# Patient Record
Sex: Female | Born: 1960
Health system: Southern US, Community
[De-identification: ages and names within clinical notes are randomized; demographics above are authoritative.]

## PROBLEM LIST (undated history)

## (undated) DIAGNOSIS — E119 Type 2 diabetes mellitus without complications: Secondary | ICD-10-CM

## (undated) DIAGNOSIS — F419 Anxiety disorder, unspecified: Secondary | ICD-10-CM

## (undated) DIAGNOSIS — K5909 Other constipation: Secondary | ICD-10-CM

## (undated) DIAGNOSIS — N951 Menopausal and female climacteric states: Secondary | ICD-10-CM

## (undated) DIAGNOSIS — R600 Localized edema: Secondary | ICD-10-CM

## (undated) DIAGNOSIS — L309 Dermatitis, unspecified: Secondary | ICD-10-CM

## (undated) DIAGNOSIS — R0683 Snoring: Secondary | ICD-10-CM

## (undated) DIAGNOSIS — K219 Gastro-esophageal reflux disease without esophagitis: Secondary | ICD-10-CM

## (undated) DIAGNOSIS — K56609 Unspecified intestinal obstruction, unspecified as to partial versus complete obstruction: Secondary | ICD-10-CM

## (undated) DIAGNOSIS — I10 Essential (primary) hypertension: Secondary | ICD-10-CM

## (undated) DIAGNOSIS — E785 Hyperlipidemia, unspecified: Secondary | ICD-10-CM

## (undated) DIAGNOSIS — D649 Anemia, unspecified: Secondary | ICD-10-CM

## (undated) DIAGNOSIS — A6004 Herpesviral vulvovaginitis: Secondary | ICD-10-CM

## (undated) DIAGNOSIS — T7840XA Allergy, unspecified, initial encounter: Secondary | ICD-10-CM

## (undated) HISTORY — PX: OTHER SURGICAL HISTORY: SHX169

## (undated) HISTORY — DX: Anemia, unspecified: D64.9

## (undated) HISTORY — PX: WISDOM TOOTH EXTRACTION: SHX21

## (undated) HISTORY — DX: Snoring: R06.83

## (undated) HISTORY — DX: Essential (primary) hypertension: I10

## (undated) HISTORY — DX: Hyperlipidemia, unspecified: E78.5

## (undated) HISTORY — DX: Dermatitis, unspecified: L30.9

## (undated) HISTORY — DX: Gastro-esophageal reflux disease without esophagitis: K21.9

## (undated) HISTORY — DX: Localized edema: R60.0

## (undated) HISTORY — DX: Other constipation: K59.09

## (undated) HISTORY — DX: Anxiety disorder, unspecified: F41.9

## (undated) HISTORY — DX: Menopausal and female climacteric states: N95.1

## (undated) HISTORY — DX: Herpesviral vulvovaginitis: A60.04

## (undated) HISTORY — DX: Allergy, unspecified, initial encounter: T78.40XA

## (undated) HISTORY — DX: Type 2 diabetes mellitus without complications: E11.9

## (undated) HISTORY — DX: Morbid (severe) obesity due to excess calories: E66.01

---

## 2007-02-09 ENCOUNTER — Ambulatory Visit: Payer: Self-pay | Admitting: Internal Medicine

## 2007-02-14 ENCOUNTER — Ambulatory Visit: Payer: Self-pay | Admitting: Internal Medicine

## 2013-02-01 LAB — HM PAP SMEAR: HM Pap smear: NORMAL

## 2013-06-14 ENCOUNTER — Ambulatory Visit: Payer: Self-pay | Admitting: Family Medicine

## 2013-06-14 LAB — HM MAMMOGRAPHY: HM Mammogram: NORMAL

## 2013-08-13 LAB — LIPID PANEL
Cholesterol: 144 mg/dL (ref 0–200)
HDL: 55 mg/dL (ref 35–70)
LDL CALC: 76 mg/dL
TRIGLYCERIDES: 67 mg/dL (ref 40–160)

## 2014-09-09 LAB — HEMOGLOBIN A1C: Hgb A1c MFr Bld: 6.9 % — AB (ref 4.0–6.0)

## 2014-10-13 ENCOUNTER — Other Ambulatory Visit: Payer: Self-pay | Admitting: Family Medicine

## 2014-10-13 NOTE — Telephone Encounter (Signed)
Sent 2 months supply , needs follow up the end of August.

## 2014-12-14 ENCOUNTER — Encounter: Payer: Self-pay | Admitting: Family Medicine

## 2014-12-14 DIAGNOSIS — K219 Gastro-esophageal reflux disease without esophagitis: Secondary | ICD-10-CM | POA: Insufficient documentation

## 2014-12-14 DIAGNOSIS — E1129 Type 2 diabetes mellitus with other diabetic kidney complication: Secondary | ICD-10-CM | POA: Insufficient documentation

## 2014-12-14 DIAGNOSIS — N951 Menopausal and female climacteric states: Secondary | ICD-10-CM | POA: Insufficient documentation

## 2014-12-14 DIAGNOSIS — A6004 Herpesviral vulvovaginitis: Secondary | ICD-10-CM | POA: Insufficient documentation

## 2014-12-14 DIAGNOSIS — I10 Essential (primary) hypertension: Secondary | ICD-10-CM | POA: Insufficient documentation

## 2014-12-14 DIAGNOSIS — A6009 Herpesviral infection of other urogenital tract: Secondary | ICD-10-CM | POA: Insufficient documentation

## 2014-12-14 DIAGNOSIS — L309 Dermatitis, unspecified: Secondary | ICD-10-CM | POA: Insufficient documentation

## 2014-12-14 DIAGNOSIS — K5909 Other constipation: Secondary | ICD-10-CM | POA: Insufficient documentation

## 2014-12-14 DIAGNOSIS — E785 Hyperlipidemia, unspecified: Secondary | ICD-10-CM | POA: Insufficient documentation

## 2014-12-14 DIAGNOSIS — J302 Other seasonal allergic rhinitis: Secondary | ICD-10-CM | POA: Insufficient documentation

## 2014-12-14 DIAGNOSIS — R600 Localized edema: Secondary | ICD-10-CM | POA: Insufficient documentation

## 2014-12-16 ENCOUNTER — Ambulatory Visit: Payer: Self-pay | Admitting: Family Medicine

## 2014-12-18 ENCOUNTER — Ambulatory Visit: Payer: Self-pay | Admitting: Family Medicine

## 2014-12-25 ENCOUNTER — Ambulatory Visit (INDEPENDENT_AMBULATORY_CARE_PROVIDER_SITE_OTHER): Payer: BLUE CROSS/BLUE SHIELD | Admitting: Family Medicine

## 2014-12-25 ENCOUNTER — Encounter: Payer: Self-pay | Admitting: Family Medicine

## 2014-12-25 VITALS — BP 136/88 | HR 104 | Temp 97.9°F | Resp 14 | Ht 63.0 in | Wt 257.0 lb

## 2014-12-25 DIAGNOSIS — Z23 Encounter for immunization: Secondary | ICD-10-CM

## 2014-12-25 DIAGNOSIS — K5900 Constipation, unspecified: Secondary | ICD-10-CM

## 2014-12-25 DIAGNOSIS — Z114 Encounter for screening for human immunodeficiency virus [HIV]: Secondary | ICD-10-CM

## 2014-12-25 DIAGNOSIS — R0683 Snoring: Secondary | ICD-10-CM | POA: Diagnosis not present

## 2014-12-25 DIAGNOSIS — R600 Localized edema: Secondary | ICD-10-CM

## 2014-12-25 DIAGNOSIS — R5383 Other fatigue: Secondary | ICD-10-CM

## 2014-12-25 DIAGNOSIS — N951 Menopausal and female climacteric states: Secondary | ICD-10-CM | POA: Diagnosis not present

## 2014-12-25 DIAGNOSIS — R609 Edema, unspecified: Secondary | ICD-10-CM

## 2014-12-25 DIAGNOSIS — I10 Essential (primary) hypertension: Secondary | ICD-10-CM | POA: Diagnosis not present

## 2014-12-25 DIAGNOSIS — E1121 Type 2 diabetes mellitus with diabetic nephropathy: Secondary | ICD-10-CM | POA: Diagnosis not present

## 2014-12-25 DIAGNOSIS — E785 Hyperlipidemia, unspecified: Secondary | ICD-10-CM

## 2014-12-25 DIAGNOSIS — K219 Gastro-esophageal reflux disease without esophagitis: Secondary | ICD-10-CM | POA: Diagnosis not present

## 2014-12-25 DIAGNOSIS — E668 Other obesity: Secondary | ICD-10-CM

## 2014-12-25 DIAGNOSIS — J302 Other seasonal allergic rhinitis: Secondary | ICD-10-CM | POA: Diagnosis not present

## 2014-12-25 DIAGNOSIS — K5909 Other constipation: Secondary | ICD-10-CM

## 2014-12-25 LAB — POCT GLYCOSYLATED HEMOGLOBIN (HGB A1C): HEMOGLOBIN A1C: 5.9

## 2014-12-25 LAB — POCT UA - MICROALBUMIN: Microalbumin Ur, POC: 100 mg/L

## 2014-12-25 MED ORDER — ATORVASTATIN CALCIUM 40 MG PO TABS: 40.0000 mg | ORAL_TABLET | Freq: Every evening | ORAL | Status: DC

## 2014-12-25 MED ORDER — LORATADINE 10 MG PO TABS: 10.0000 mg | ORAL_TABLET | Freq: Every day | ORAL | Status: DC

## 2014-12-25 MED ORDER — VALACYCLOVIR HCL 500 MG PO TABS: 500.0000 mg | ORAL_TABLET | Freq: Three times a day (TID) | ORAL | Status: DC

## 2014-12-25 MED ORDER — RANITIDINE HCL 150 MG PO TABS: 150.0000 mg | ORAL_TABLET | Freq: Every day | ORAL | Status: DC | PRN

## 2014-12-25 MED ORDER — VENLAFAXINE HCL ER 75 MG PO CP24: 75.0000 mg | ORAL_CAPSULE | Freq: Every day | ORAL | Status: DC

## 2014-12-25 MED ORDER — SITAGLIP PHOS-METFORMIN HCL ER 50-1000 MG PO TB24: 2.0000 | ORAL_TABLET | Freq: Every day | ORAL | Status: DC

## 2014-12-25 MED ORDER — TELMISARTAN-HCTZ 80-25 MG PO TABS: 1.0000 | ORAL_TABLET | Freq: Every day | ORAL | Status: DC

## 2014-12-25 NOTE — Progress Notes (Signed)
Name: Wendy [name]   MRN: [id number]    DOB: 02/07/1961   Date:12/25/2014       Progress Note  Subjective  Chief Complaint  Chief Complaint  Patient presents with  . Medication Refill    3 month F/U  . Diabetes    Patient lost her strips, but checked BS bid, Low- High-132   . Hypertension  . Depression  . Hyperlipidemia    HPI  DM with nephropathy: she is taking medication as recommended, except for the past week ( ran out ), fsbs is around 120-130's . Denies hypoglycemic episodes.  She denies polyphagia or polydipsia but she has urinary frequency.   HTN: taking medications as indicated, bp is at goal  Hyperlipidemia: taking Atorvastatin and denies side effects of medication  Menopausal symptoms: she has mood swings, hot flashes, night sweats, some vaginal dryness. She has been taking Effexor and it has improved symptoms  Obesity: no longer drinking sodas, but has not lost weight, however glucose is better, and is feeing is smaller clothes.    Patient Active Problem List   Diagnosis Date Noted  . Snoring 12/25/2014  . Allergic rhinitis, seasonal 12/14/2014  . Chronic constipation 12/14/2014  . Diabetes mellitus with renal manifestation 12/14/2014  . Dyslipidemia 12/14/2014  . Dermatitis, eczematoid 12/14/2014  . Edema extremities 12/14/2014  . Essential (primary) hypertension 12/14/2014  . Gastro-esophageal reflux disease without esophagitis 12/14/2014  . Genital herpes in women 12/14/2014  . Extreme obesity 12/14/2014  . Female climacteric state 12/14/2014    Past Surgical History  Procedure Laterality Date  . Cesarean section  1993    Family History  Problem Relation Age of Onset  . Diabetes Mother   . Asthma Mother   . Heart disease Mother   . Cancer Father     prostate  . Heart disease Father     Social History   Social History  . Marital Status: Married    Spouse Name: N/A  . Number of Children: N/A  . Years of Education: N/A   Occupational  History  . Not on file.   Social History Main Topics  . Smoking status: Never Smoker   . Smokeless tobacco: Never Used  . Alcohol Use: No  . Drug Use: No  . Sexual Activity: Yes   Other Topics Concern  . Not on file   Social History Narrative     Current outpatient prescriptions:  .  aspirin (ASPIRIN LOW DOSE) 81 MG chewable tablet, Chew 1 tablet by mouth daily., Disp: , Rfl:  .  atorvastatin (LIPITOR) 40 MG tablet, Take 1 tablet (40 mg total) by mouth every evening., Disp: 30 tablet, Rfl: 2 .  loratadine (CLARITIN) 10 MG tablet, Take 1 tablet (10 mg total) by mouth daily., Disp: 30 tablet, Rfl: 5 .  Polyethylene Glycol 3350 POWD, Take by mouth., Disp: , Rfl:  .  ranitidine (ZANTAC) 150 MG tablet, Take 1 tablet (150 mg total) by mouth daily as needed., Disp: 30 tablet, Rfl: 5 .  SitaGLIPtin-MetFORMIN HCl (JANUMET XR) 50-1000 MG TB24, Take 2 tablets by mouth daily., Disp: 60 tablet, Rfl: 2 .  telmisartan-hydrochlorothiazide (MICARDIS HCT) 80-25 MG per tablet, Take 1 tablet by mouth daily., Disp: 30 tablet, Rfl: 2 .  triamcinolone cream (KENALOG) 0.1 %, Apply 1 application topically as needed., Disp: , Rfl:  .  valACYclovir (VALTREX) 500 MG tablet, Take 1 tablet (500 mg total) by mouth 3 (three) times daily., Disp: 30 tablet, Rfl: 2 .  venlafaxine  XR (EFFEXOR-XR) 75 MG 24 hr capsule, Take 1 capsule (75 mg total) by mouth daily., Disp: 30 capsule, Rfl: 2  Allergies  Allergen Reactions  . Penicillins Swelling     ROS  Constitutional: Negative for fever or weight change.  Respiratory: Negative for cough and shortness of breath.   Cardiovascular: Negative for chest pain or palpitations.  Gastrointestinal: Negative for abdominal pain, no bowel changes.  Musculoskeletal: Negative for gait problem or joint swelling.  Skin: Negative for rash.  Neurological: Negative for dizziness or headache.  No other specific complaints in a complete review of systems (except as listed in HPI  above).   Objective  Filed Vitals:   12/25/14 1422  BP: 136/88  Pulse: 104  Temp: 97.9 F (36.6 C)  TempSrc: Oral  Resp: 14  Height: 5\' 3"  (1.6 m)  Weight: 257 lb (116.574 kg)  SpO2: 95%    Body mass index is 45.54 kg/(m^2).  Physical Exam  Constitutional: Patient appears well-developed and well-nourished. Obese  No distress.  neck supple, throat within normal limits Cardiovascular: Normal rate, regular rhythm and normal heart sounds.  No murmur heard. No BLE edema. Pulmonary/Chest: Effort normal and breath sounds normal. No respiratory distress. Abdominal: Soft.  There is no tenderness. Psychiatric: Patient has a normal mood and affect. behavior is normal. Judgment and thought content normal.  Recent Results (from the past 2160 hour(s))  POCT UA - Microalbumin     Status: Abnormal   Collection Time: 12/25/14  2:51 PM  Result Value Ref Range   Microalbumin Ur, POC 100 mg/L   Creatinine, POC  mg/dL   Albumin/Creatinine Ratio, Urine, POC    POCT HgB A1C     Status: Normal   Collection Time: 12/25/14  2:52 PM  Result Value Ref Range   Hemoglobin A1C 5.9     Diabetic Foot Exam: Diabetic Foot Exam - Simple   Simple Foot Form  Visual Inspection  No deformities, no ulcerations, no other skin breakdown bilaterally:  Yes  Sensation Testing  Intact to touch and monofilament testing bilaterally:  Yes  Pulse Check  Posterior Tibialis and Dorsalis pulse intact bilaterally:  Yes  Comments      PHQ2/9: Depression screen PHQ 2/9 12/25/2014  Decreased Interest 0  Down, Depressed, Hopeless 1  PHQ - 2 Score 1    Fall Risk: Fall Risk  12/25/2014  Falls in the past year? No    Functional Status Survey: Is the patient deaf or have difficulty hearing?: No Does the patient have difficulty seeing, even when wearing glasses/contacts?: Yes (glasses) Does the patient have difficulty walking or climbing stairs?: No Does the patient have difficulty dressing or bathing?: No Does  the patient have difficulty doing errands alone such as visiting a doctor's office or shopping?: No    Assessment & Plan  1. Type 2 diabetes mellitus with diabetic nephropathy Stop Glipizide , glucose is at goal, continue ARB, urine micro is going up - SitaGLIPtin-MetFORMIN HCl (JANUMET XR) 50-1000 MG TB24; Take 2 tablets by mouth daily.  Dispense: 60 tablet; Refill: 2  2. Needs flu shot  - Flu Vaccine QUAD 36+ mos PF IM (Fluarix & Fluzone Quad PF)-refused  3. Dyslipidemia  - Lipid panel - atorvastatin (LIPITOR) 40 MG tablet; Take 1 tablet (40 mg total) by mouth every evening.  Dispense: 30 tablet; Refill: 2  4. Gastro-esophageal reflux disease without esophagitis  - ranitidine (ZANTAC) 150 MG tablet; Take 1 tablet (150 mg total) by mouth daily as needed.  Dispense: 30 tablet; Refill: 5  5. Extreme obesity Discussed with the patient the risk posed by an increased BMI. Discussed importance of portion control, calorie counting and at least 150 minutes of physical activity weekly. Avoid sweet beverages and drink more water. Eat at least 6 servings of fruit and vegetables daily   6. Allergic rhinitis, seasonal  - loratadine (CLARITIN) 10 MG tablet; Take 1 tablet (10 mg total) by mouth daily.  Dispense: 30 tablet; Refill: 5  7. Essential (primary) hypertension  - Comprehensive metabolic panel - CBC with Differential/Platelet - telmisartan-hydrochlorothiazide (MICARDIS HCT) 80-25 MG per tablet; Take 1 tablet by mouth daily.  Dispense: 30 tablet; Refill: 2  8. Edema extremities Doing better  9. Chronic constipation Doing well on prn medication   10. Female climacteric state  - venlafaxine XR (EFFEXOR-XR) 75 MG 24 hr capsule; Take 1 capsule (75 mg total) by mouth daily.  Dispense: 30 capsule; Refill: 2  11. Snoring She missed appointment for sleep study because of her working hours, will call back when she changes shift  12. Need for pneumococcal vaccination  - Pneumococcal  polysaccharide vaccine 23-valent greater than or equal to 2yo subcutaneous/IM - refused  13. Encounter for screening for HIV  - HIV antibody  14. Other fatigue  - CBC with Differential/Platelet - Vitamin B12 - Vit D  25 hydroxy (rtn osteoporosis monitoring) - TSH

## 2014-12-26 LAB — HIV ANTIBODY (ROUTINE TESTING W REFLEX): HIV SCREEN 4TH GENERATION: NONREACTIVE

## 2014-12-26 LAB — CBC WITH DIFFERENTIAL/PLATELET
BASOS ABS: 0 10*3/uL (ref 0.0–0.2)
Basos: 0 %
EOS (ABSOLUTE): 0.2 10*3/uL (ref 0.0–0.4)
Eos: 2 %
HEMOGLOBIN: 12.3 g/dL (ref 11.1–15.9)
Hematocrit: 37.2 % (ref 34.0–46.6)
Immature Grans (Abs): 0 10*3/uL (ref 0.0–0.1)
Immature Granulocytes: 0 %
LYMPHS ABS: 3.5 10*3/uL — AB (ref 0.7–3.1)
Lymphs: 36 %
MCH: 27.8 pg (ref 26.6–33.0)
MCHC: 33.1 g/dL (ref 31.5–35.7)
MCV: 84 fL (ref 79–97)
Monocytes Absolute: 0.6 10*3/uL (ref 0.1–0.9)
Monocytes: 6 %
NEUTROS ABS: 5.5 10*3/uL (ref 1.4–7.0)
Neutrophils: 56 %
PLATELETS: 237 10*3/uL (ref 150–379)
RBC: 4.43 x10E6/uL (ref 3.77–5.28)
RDW: 15 % (ref 12.3–15.4)
WBC: 9.8 10*3/uL (ref 3.4–10.8)

## 2014-12-26 LAB — LIPID PANEL
CHOL/HDL RATIO: 2.9 ratio (ref 0.0–4.4)
CHOLESTEROL TOTAL: 140 mg/dL (ref 100–199)
HDL: 48 mg/dL (ref >39)
LDL CALC: 68 mg/dL (ref 0–99)
Triglycerides: 118 mg/dL (ref 0–149)
VLDL CHOLESTEROL CAL: 24 mg/dL (ref 5–40)

## 2014-12-26 LAB — COMPREHENSIVE METABOLIC PANEL
ALK PHOS: 119 IU/L — AB (ref 39–117)
ALT: 16 IU/L (ref 0–32)
AST: 18 IU/L (ref 0–40)
Albumin/Globulin Ratio: 1.9 (ref 1.1–2.5)
Albumin: 4.4 g/dL (ref 3.5–5.5)
BUN/Creatinine Ratio: 13 (ref 9–23)
BUN: 9 mg/dL (ref 6–24)
Bilirubin Total: 0.3 mg/dL (ref 0.0–1.2)
CALCIUM: 9.6 mg/dL (ref 8.7–10.2)
CO2: 23 mmol/L (ref 18–29)
CREATININE: 0.69 mg/dL (ref 0.57–1.00)
Chloride: 100 mmol/L (ref 97–108)
GFR calc Af Amer: 114 mL/min/{1.73_m2} (ref >59)
GFR, EST NON AFRICAN AMERICAN: 99 mL/min/{1.73_m2} (ref >59)
GLOBULIN, TOTAL: 2.3 g/dL (ref 1.5–4.5)
GLUCOSE: 109 mg/dL — AB (ref 65–99)
Potassium: 3.5 mmol/L (ref 3.5–5.2)
SODIUM: 144 mmol/L (ref 134–144)
Total Protein: 6.7 g/dL (ref 6.0–8.5)

## 2014-12-26 LAB — VITAMIN D 25 HYDROXY (VIT D DEFICIENCY, FRACTURES): Vit D, 25-Hydroxy: 11.4 ng/mL — ABNORMAL LOW (ref 30.0–100.0)

## 2014-12-26 LAB — TSH: TSH: 2.33 u[IU]/mL (ref 0.450–4.500)

## 2014-12-26 LAB — VITAMIN B12: VITAMIN B 12: 291 pg/mL (ref 211–946)

## 2014-12-27 ENCOUNTER — Other Ambulatory Visit: Payer: Self-pay | Admitting: Family Medicine

## 2014-12-27 MED ORDER — VITAMIN D (ERGOCALCIFEROL) 1.25 MG (50000 UNIT) PO CAPS: 50000.0000 [IU] | ORAL_CAPSULE | ORAL | Status: DC

## 2014-12-30 NOTE — Progress Notes (Signed)
Pt.notified

## 2015-01-04 ENCOUNTER — Other Ambulatory Visit: Payer: Self-pay | Admitting: Family Medicine

## 2015-01-06 ENCOUNTER — Telehealth: Payer: Self-pay | Admitting: Family Medicine

## 2015-01-06 NOTE — Telephone Encounter (Signed)
Pt would like to know when does she need to come and take her B 12 shot?

## 2015-01-20 ENCOUNTER — Ambulatory Visit (INDEPENDENT_AMBULATORY_CARE_PROVIDER_SITE_OTHER): Payer: BLUE CROSS/BLUE SHIELD

## 2015-01-20 DIAGNOSIS — D519 Vitamin B12 deficiency anemia, unspecified: Secondary | ICD-10-CM | POA: Diagnosis not present

## 2015-01-20 MED ORDER — CYANOCOBALAMIN 1000 MCG/ML IJ SOLN
1000.0000 ug | Freq: Once | INTRAMUSCULAR | Status: AC
  Administered 2015-01-20: 1000 ug via INTRAMUSCULAR

## 2015-03-05 ENCOUNTER — Ambulatory Visit (INDEPENDENT_AMBULATORY_CARE_PROVIDER_SITE_OTHER): Payer: BLUE CROSS/BLUE SHIELD

## 2015-03-05 DIAGNOSIS — E538 Deficiency of other specified B group vitamins: Secondary | ICD-10-CM

## 2015-03-05 MED ORDER — CYANOCOBALAMIN 1000 MCG/ML IJ SOLN
1000.0000 ug | Freq: Once | INTRAMUSCULAR | Status: AC
  Administered 2015-03-05: 1000 ug via INTRAMUSCULAR

## 2015-03-27 ENCOUNTER — Ambulatory Visit (INDEPENDENT_AMBULATORY_CARE_PROVIDER_SITE_OTHER): Payer: BLUE CROSS/BLUE SHIELD | Admitting: Family Medicine

## 2015-03-27 ENCOUNTER — Encounter: Payer: Self-pay | Admitting: Family Medicine

## 2015-03-27 VITALS — BP 130/86 | HR 97 | Temp 97.9°F | Resp 16 | Ht 63.0 in | Wt 263.1 lb

## 2015-03-27 DIAGNOSIS — E785 Hyperlipidemia, unspecified: Secondary | ICD-10-CM | POA: Diagnosis not present

## 2015-03-27 DIAGNOSIS — N951 Menopausal and female climacteric states: Secondary | ICD-10-CM | POA: Diagnosis not present

## 2015-03-27 DIAGNOSIS — I10 Essential (primary) hypertension: Secondary | ICD-10-CM

## 2015-03-27 DIAGNOSIS — Z23 Encounter for immunization: Secondary | ICD-10-CM | POA: Diagnosis not present

## 2015-03-27 DIAGNOSIS — E1121 Type 2 diabetes mellitus with diabetic nephropathy: Secondary | ICD-10-CM

## 2015-03-27 DIAGNOSIS — R0683 Snoring: Secondary | ICD-10-CM

## 2015-03-27 DIAGNOSIS — E538 Deficiency of other specified B group vitamins: Secondary | ICD-10-CM | POA: Diagnosis not present

## 2015-03-27 DIAGNOSIS — R198 Other specified symptoms and signs involving the digestive system and abdomen: Secondary | ICD-10-CM

## 2015-03-27 DIAGNOSIS — R194 Change in bowel habit: Secondary | ICD-10-CM | POA: Diagnosis not present

## 2015-03-27 DIAGNOSIS — Z1211 Encounter for screening for malignant neoplasm of colon: Secondary | ICD-10-CM

## 2015-03-27 LAB — POCT GLYCOSYLATED HEMOGLOBIN (HGB A1C): HEMOGLOBIN A1C: 8.1

## 2015-03-27 MED ORDER — VENLAFAXINE HCL ER 75 MG PO CP24: 75.0000 mg | ORAL_CAPSULE | Freq: Every day | ORAL | Status: DC

## 2015-03-27 MED ORDER — TELMISARTAN-HCTZ 80-25 MG PO TABS: 1.0000 | ORAL_TABLET | Freq: Every day | ORAL | Status: DC

## 2015-03-27 MED ORDER — DAPAGLIFLOZIN PROPANEDIOL 10 MG PO TABS: 10.0000 mg | ORAL_TABLET | Freq: Every day | ORAL | Status: DC

## 2015-03-27 MED ORDER — ATORVASTATIN CALCIUM 40 MG PO TABS: 40.0000 mg | ORAL_TABLET | Freq: Every evening | ORAL | Status: DC

## 2015-03-27 MED ORDER — CYANOCOBALAMIN 1000 MCG/ML IJ SOLN
1000.0000 ug | Freq: Once | INTRAMUSCULAR | Status: AC
  Administered 2015-03-27: 1000 ug via INTRAMUSCULAR

## 2015-03-27 MED ORDER — SITAGLIP PHOS-METFORMIN HCL ER 50-1000 MG PO TB24: 2.0000 | ORAL_TABLET | Freq: Every day | ORAL | Status: DC

## 2015-03-27 MED ORDER — FLUCONAZOLE 150 MG PO TABS: 150.0000 mg | ORAL_TABLET | ORAL | Status: DC

## 2015-03-27 NOTE — Progress Notes (Signed)
Name: Wendy [name]   MRN: [id number]    DOB: [birth year]   Date:03/27/2015       Progress Note  Subjective  Chief Complaint  Chief Complaint  Patient presents with  . Medication Refill    follow-up  . Diabetes    checks glucose 1x day low-100, high-118  . Hypertension  . Hyperlipidemia  . Depression  . Gastroesophageal Reflux    HPI  DM with nephropathy: she is taking medication as recommended, we stopped glipizide on her last visit because hgb A1C was down to 5.9%, however it has gone up to 8.1% now and we will add [place] - discussed possible side effects. Fsbs is around 120's fasting.  Denies hypoglycemic episodes. She has noticed polyphagia - usually in am's, no polydipsia or polyuria.   HTN: taking medications as indicated, bp is at goal. No chest pain or palpitation   Hyperlipidemia: taking Atorvastatin and denies side effects of medication  Menopausal symptoms: she has mood swings, hot flashes, night sweats, some vaginal dryness. She has been taking Effexor and is doing well. Hot flashes and night sweats have resolved completely.   Obesity: she is drinking sodas occasionally again. She walks her dogs daily for about 10 minutes. Eating healthier.   Snoring: she did not go to sleep study, but will re-schedule. She always feels tired.   AR: she has not been taking allergy medication, and has noticed some mild dry cough, without wheezing or SOB. Advised to resume allergy medication and if no resolution of symptoms to return for follow up  Patient Active Problem List   Diagnosis Date Noted  . Snoring 12/25/2014  . Allergic rhinitis, seasonal 12/14/2014  . Chronic constipation 12/14/2014  . Diabetes mellitus with renal manifestation ([place]) 12/14/2014  . Dyslipidemia 12/14/2014  . Dermatitis, eczematoid 12/14/2014  . Edema extremities 12/14/2014  . Essential (primary) hypertension 12/14/2014  . Gastro-esophageal reflux disease without esophagitis 12/14/2014  . Genital herpes  in women 12/14/2014  . Extreme obesity ([place]) 12/14/2014  . Female climacteric state 12/14/2014    Past Surgical History  Procedure Laterality Date  . Cesarean section  1993    Family History  Problem Relation Age of Onset  . Diabetes Mother   . Asthma Mother   . Heart disease Mother   . Cancer Father     prostate  . Heart disease Father     Social History   Social History  . Marital Status: Married    Spouse Name: N/A  . Number of Children: N/A  . Years of Education: N/A   Occupational History  . Not on file.   Social History Main Topics  . Smoking status: Never Smoker   . Smokeless tobacco: Never Used  . Alcohol Use: No  . Drug Use: No  . Sexual Activity: Yes   Other Topics Concern  . Not on file   Social History Narrative     Current outpatient prescriptions:  .  aspirin (ASPIRIN LOW DOSE) 81 MG chewable tablet, Chew 1 tablet by mouth daily., Disp: , Rfl:  .  atorvastatin (LIPITOR) 40 MG tablet, Take 1 tablet (40 mg total) by mouth every evening., Disp: 30 tablet, Rfl: 2 .  dapagliflozin propanediol (FARXIGA) 10 MG TABS tablet, Take 10 mg by mouth daily., Disp: 30 tablet, Rfl: 2 .  fluconazole (DIFLUCAN) 150 MG tablet, Take 1 tablet (150 mg total) by mouth every other day. Prn for yeast, Disp: 10 tablet, Rfl: 0 .  loratadine (CLARITIN) 10 MG tablet,  Take 1 tablet (10 mg total) by mouth daily., Disp: 30 tablet, Rfl: 5 .  Polyethylene Glycol 3350 POWD, Take by mouth., Disp: , Rfl:  .  ranitidine (ZANTAC) 150 MG tablet, Take 1 tablet (150 mg total) by mouth daily as needed., Disp: 30 tablet, Rfl: 5 .  SitaGLIPtin-MetFORMIN HCl (JANUMET XR) 50-1000 MG TB24, Take 2 tablets by mouth daily., Disp: 60 tablet, Rfl: 2 .  telmisartan-hydrochlorothiazide (MICARDIS HCT) 80-25 MG tablet, Take 1 tablet by mouth daily., Disp: 30 tablet, Rfl: 2 .  triamcinolone cream (KENALOG) 0.1 %, Apply 1 application topically as needed., Disp: , Rfl:  .  valACYclovir (VALTREX) 500 MG  tablet, Take 1 tablet (500 mg total) by mouth 3 (three) times daily., Disp: 30 tablet, Rfl: 2 .  venlafaxine XR (EFFEXOR-XR) 75 MG 24 hr capsule, Take 1 capsule (75 mg total) by mouth daily., Disp: 30 capsule, Rfl: 2  Allergies  Allergen Reactions  . Penicillins Swelling     ROS  Constitutional: Negative for fever , mild weight change.  Respiratory: Mild cough for the past month, no  shortness of breath.   Cardiovascular: Negative for chest pain or palpitations.  Gastrointestinal: Negative for abdominal pain - she had change in  bowel movements about one month ago - diarrhea but resolved now Musculoskeletal: Negative for gait problem or joint swelling.  Skin: Negative for rash.  Neurological: Negative for dizziness or headache.  No other specific complaints in a complete review of systems (except as listed in HPI above).  Objective  Filed Vitals:   03/27/15 0850  BP: 130/86  Pulse: 97  Temp: 97.9 F (36.6 C)  TempSrc: Oral  Resp: 16  Height: 5\' 3"  (1.6 m)  Weight: 263 lb 1.6 oz (119.341 kg)  SpO2: 99%    Body mass index is 46.62 kg/(m^2).  Physical Exam  Constitutional: Patient appears well-developed Obese  No distress.  HEENT: head atraumatic, normocephalic, pupils equal and reactive to light, boggy turbinates and clear rhinorrhea, neck supple, throat within normal limits Cardiovascular: Normal rate, regular rhythm and normal heart sounds.  No murmur heard. No BLE edema. Pulmonary/Chest: Effort normal and breath sounds normal. No respiratory distress. Abdominal: Soft.  There is no tenderness. Psychiatric: Patient has a normal mood and affect. behavior is normal. Judgment and thought content normal.  Recent Results (from the past 2160 hour(s))  POCT HgB A1C     Status: Abnormal   Collection Time: 03/27/15  8:56 AM  Result Value Ref Range   Hemoglobin A1C 8.1     PHQ2/9: Depression screen [facility] 2/9 03/27/2015 12/25/2014  Decreased Interest 0 0  Down, Depressed,  Hopeless 1 1  PHQ - 2 Score 1 1     Fall Risk: Fall Risk  03/27/2015 12/25/2014  Falls in the past year? No No    Functional Status Survey: Is the patient deaf or have difficulty hearing?: No Does the patient have difficulty seeing, even when wearing glasses/contacts?: Yes (contacts) Does the patient have difficulty concentrating, remembering, or making decisions?: No Does the patient have difficulty walking or climbing stairs?: No Does the patient have difficulty dressing or bathing?: No Does the patient have difficulty doing errands alone such as visiting a doctor's office or shopping?: No    Assessment & Plan  1. Diabetic nephropathy associated with type 2 diabetes mellitus ([place])  We will add Farxiga, aware of side effects - POCT HgB A1C - SitaGLIPtin-MetFORMIN HCl (JANUMET XR) 50-1000 MG TB24; Take 2 tablets by mouth daily.  Dispense: 60 tablet; Refill: 2 - dapagliflozin propanediol (FARXIGA) 10 MG TABS tablet; Take 10 mg by mouth daily.  Dispense: 30 tablet; Refill: 2 - fluconazole (DIFLUCAN) 150 MG tablet; Take 1 tablet (150 mg total) by mouth every other day. Prn for yeast  Dispense: 10 tablet; Refill: 0  2. Vitamin B12 deficiency  - cyanocobalamin ((VITAMIN B-12)) injection 1,000 mcg; Inject 1 mL (1,000 mcg total) into the muscle once.  3. Need for pneumococcal vaccination  - Pneumococcal polysaccharide vaccine 23-valent greater than or equal to 2yo subcutaneous/IM  4. Dyslipidemia  - atorvastatin (LIPITOR) 40 MG tablet; Take 1 tablet (40 mg total) by mouth every evening.  Dispense: 30 tablet; Refill: 2  5. Essential (primary) hypertension  - telmisartan-hydrochlorothiazide (MICARDIS HCT) 80-25 MG tablet; Take 1 tablet by mouth daily.  Dispense: 30 tablet; Refill: 2  6. Female climacteric state  - venlafaxine XR (EFFEXOR-XR) 75 MG 24 hr capsule; Take 1 capsule (75 mg total) by mouth daily.  Dispense: 30 capsule; Refill: 2  7. Colon cancer screening  -  Ambulatory referral to Gastroenterology  8. Change in bowel movement  Referral to GI  9. Snoring  Needs to have sleep study, she will schedule   10. Needs flu shot  - Flu Vaccine QUAD 36+ mos IM

## 2015-04-07 ENCOUNTER — Ambulatory Visit (INDEPENDENT_AMBULATORY_CARE_PROVIDER_SITE_OTHER): Payer: BLUE CROSS/BLUE SHIELD | Admitting: Family Medicine

## 2015-04-07 ENCOUNTER — Encounter: Payer: Self-pay | Admitting: Family Medicine

## 2015-04-07 ENCOUNTER — Ambulatory Visit: Payer: BLUE CROSS/BLUE SHIELD | Admitting: Family Medicine

## 2015-04-07 VITALS — BP 130/86 | HR 100 | Temp 98.5°F | Resp 20 | Wt 260.4 lb

## 2015-04-07 DIAGNOSIS — H65111 Acute and subacute allergic otitis media (mucoid) (sanguinous) (serous), right ear: Secondary | ICD-10-CM

## 2015-04-07 DIAGNOSIS — J069 Acute upper respiratory infection, unspecified: Secondary | ICD-10-CM | POA: Diagnosis not present

## 2015-04-07 MED ORDER — SALINE SPRAY 0.65 % NA SOLN: 1.0000 | NASAL | Status: DC | PRN

## 2015-04-07 MED ORDER — SULFAMETHOXAZOLE-TRIMETHOPRIM 800-160 MG PO TABS: 1.0000 | ORAL_TABLET | Freq: Two times a day (BID) | ORAL | Status: DC

## 2015-04-07 MED ORDER — DEXTROMETHORPHAN-GUAIFENESIN 10-200 MG PO CAPS: 1.0000 | ORAL_CAPSULE | Freq: Three times a day (TID) | ORAL | Status: DC

## 2015-04-07 NOTE — Progress Notes (Signed)
Name: Wendy [name]   MRN: [id number]    DOB: 1960-06-17   Date:04/07/2015       Progress Note  Subjective  Chief Complaint  Chief Complaint  Patient presents with  . URI    onset 4 days syptoms include: nasal congestion, cough, sore throat, runny nose, headache and facial pressure and pain.    HPI  URI: she started to get sick 4 days ago, initially headache, followed by sore throat and clear rhinorrhea. She has severe post-nasal drainage at night that makes her gag and feel nauseated. She also has chills, and facial pressure. Not taking otc medication because she was afraid it would raise her blood pressure. She also has noticed bilaterally ear pain.    Patient Active Problem List   Diagnosis Date Noted  . Snoring 12/25/2014  . Allergic rhinitis, seasonal 12/14/2014  . Chronic constipation 12/14/2014  . Diabetes mellitus with renal manifestation ([place]) 12/14/2014  . Dyslipidemia 12/14/2014  . Dermatitis, eczematoid 12/14/2014  . Edema extremities 12/14/2014  . Essential (primary) hypertension 12/14/2014  . Gastro-esophageal reflux disease without esophagitis 12/14/2014  . Genital herpes in women 12/14/2014  . Extreme obesity ([place]) 12/14/2014  . Female climacteric state 12/14/2014    Past Surgical History  Procedure Laterality Date  . Cesarean section  1993    Family History  Problem Relation Age of Onset  . Diabetes Mother   . Asthma Mother   . Heart disease Mother   . Cancer Father     prostate  . Heart disease Father     Social History   Social History  . Marital Status: Married    Spouse Name: N/A  . Number of Children: N/A  . Years of Education: N/A   Occupational History  . Not on file.   Social History Main Topics  . Smoking status: Never Smoker   . Smokeless tobacco: Never Used  . Alcohol Use: No  . Drug Use: No  . Sexual Activity: Yes   Other Topics Concern  . Not on file   Social History Narrative     Current outpatient prescriptions:  .   aspirin (ASPIRIN LOW DOSE) 81 MG chewable tablet, Chew 1 tablet by mouth daily., Disp: , Rfl:  .  atorvastatin (LIPITOR) 40 MG tablet, Take 1 tablet (40 mg total) by mouth every evening., Disp: 30 tablet, Rfl: 2 .  dapagliflozin propanediol (FARXIGA) 10 MG TABS tablet, Take 10 mg by mouth daily., Disp: 30 tablet, Rfl: 2 .  fluconazole (DIFLUCAN) 150 MG tablet, Take 1 tablet (150 mg total) by mouth every other day. Prn for yeast, Disp: 10 tablet, Rfl: 0 .  loratadine (CLARITIN) 10 MG tablet, Take 1 tablet (10 mg total) by mouth daily., Disp: 30 tablet, Rfl: 5 .  Polyethylene Glycol 3350 POWD, Take by mouth., Disp: , Rfl:  .  ranitidine (ZANTAC) 150 MG tablet, Take 1 tablet (150 mg total) by mouth daily as needed., Disp: 30 tablet, Rfl: 5 .  SitaGLIPtin-MetFORMIN HCl (JANUMET XR) 50-1000 MG TB24, Take 2 tablets by mouth daily., Disp: 60 tablet, Rfl: 2 .  telmisartan-hydrochlorothiazide (MICARDIS HCT) 80-25 MG tablet, Take 1 tablet by mouth daily., Disp: 30 tablet, Rfl: 2 .  triamcinolone cream (KENALOG) 0.1 %, Apply 1 application topically as needed., Disp: , Rfl:  .  valACYclovir (VALTREX) 500 MG tablet, Take 1 tablet (500 mg total) by mouth 3 (three) times daily., Disp: 30 tablet, Rfl: 2 .  venlafaxine XR (EFFEXOR-XR) 75 MG 24 hr capsule, Take  1 capsule (75 mg total) by mouth daily., Disp: 30 capsule, Rfl: 2  Allergies  Allergen Reactions  . Penicillins Swelling     ROS  Ten systems reviewed and is negative except as mentioned in HPI    Objective  Filed Vitals:   04/07/15 1448  BP: 130/86  Pulse: 100  Temp: 98.5 F (36.9 C)  TempSrc: Oral  Resp: 20  Weight: 260 lb 6.4 oz (118.117 kg)  SpO2: 99%    Body mass index is 46.14 kg/(m^2).  Physical Exam  Constitutional: Patient appears well-developed and well-nourished. Obese  No distress.  HEENT: head atraumatic, normocephalic, pupils equal and reactive to light, ears right side has mucus behind TM and very red TM, neck supple,  throat within normal limits, tender facial sinus during percussion, mild post-nasal drainage Cardiovascular: Normal rate, regular rhythm and normal heart sounds.  No murmur heard. No BLE edema. Pulmonary/Chest: Effort normal and breath sounds normal. No respiratory distress. Abdominal: Soft.  There is no tenderness. Psychiatric: Patient has a normal mood and affect. behavior is normal. Judgment and thought content normal.  Recent Results (from the past 2160 hour(s))  POCT HgB A1C     Status: Abnormal   Collection Time: 03/27/15  8:56 AM  Result Value Ref Range   Hemoglobin A1C 8.1     PHQ2/9: Depression screen [facility] 2/9 03/27/2015 12/25/2014  Decreased Interest 0 0  Down, Depressed, Hopeless 1 1  PHQ - 2 Score 1 1    Fall Risk: Fall Risk  03/27/2015 12/25/2014  Falls in the past year? No No    Assessment & Plan  1. Upper respiratory infection  Fluids and rest - Dextromethorphan-Guaifenesin (CORICIDIN HBP CONGESTION/COUGH) 10-200 MG CAPS; Take 1 capsule by mouth 3 (three) times daily.  Dispense: 1 each; Refill: 0 - sodium chloride (OCEAN) 0.65 % SOLN nasal spray; Place 1 spray into both nostrils as needed for congestion.  Dispense: 60 mL; Refill: 0  2. Acute mucoid otitis media of right ear  - sulfamethoxazole-trimethoprim (BACTRIM DS,SEPTRA DS) 800-160 MG tablet; Take 1 tablet by mouth 2 (two) times daily.  Dispense: 14 tablet; Refill: 0

## 2015-04-30 ENCOUNTER — Encounter: Payer: BLUE CROSS/BLUE SHIELD | Admitting: Family Medicine

## 2015-05-22 ENCOUNTER — Telehealth: Payer: Self-pay | Admitting: Gastroenterology

## 2015-05-22 NOTE — Telephone Encounter (Signed)
The pcp called with a new phone number in EPIC for you to try and call patient again for her colonoscopy

## 2015-05-29 NOTE — Telephone Encounter (Signed)
LVM for pt to return my call.

## 2015-06-04 ENCOUNTER — Encounter: Payer: BLUE CROSS/BLUE SHIELD | Admitting: Family Medicine

## 2015-06-05 NOTE — Telephone Encounter (Signed)
Tried contacting pt again. Left vm. Mailed letter.

## 2015-07-07 ENCOUNTER — Ambulatory Visit (INDEPENDENT_AMBULATORY_CARE_PROVIDER_SITE_OTHER): Payer: BLUE CROSS/BLUE SHIELD | Admitting: Family Medicine

## 2015-07-07 ENCOUNTER — Encounter: Payer: Self-pay | Admitting: Family Medicine

## 2015-07-07 VITALS — BP 130/60 | HR 116 | Temp 98.0°F | Resp 16 | Ht 63.0 in | Wt 259.0 lb

## 2015-07-07 DIAGNOSIS — N3946 Mixed incontinence: Secondary | ICD-10-CM

## 2015-07-07 DIAGNOSIS — Z124 Encounter for screening for malignant neoplasm of cervix: Secondary | ICD-10-CM

## 2015-07-07 DIAGNOSIS — E559 Vitamin D deficiency, unspecified: Secondary | ICD-10-CM

## 2015-07-07 DIAGNOSIS — E785 Hyperlipidemia, unspecified: Secondary | ICD-10-CM

## 2015-07-07 DIAGNOSIS — Z1211 Encounter for screening for malignant neoplasm of colon: Secondary | ICD-10-CM

## 2015-07-07 DIAGNOSIS — N951 Menopausal and female climacteric states: Secondary | ICD-10-CM

## 2015-07-07 DIAGNOSIS — Z0000 Encounter for general adult medical examination without abnormal findings: Secondary | ICD-10-CM | POA: Diagnosis not present

## 2015-07-07 DIAGNOSIS — I10 Essential (primary) hypertension: Secondary | ICD-10-CM

## 2015-07-07 DIAGNOSIS — E538 Deficiency of other specified B group vitamins: Secondary | ICD-10-CM | POA: Insufficient documentation

## 2015-07-07 DIAGNOSIS — Z01419 Encounter for gynecological examination (general) (routine) without abnormal findings: Secondary | ICD-10-CM

## 2015-07-07 DIAGNOSIS — R000 Tachycardia, unspecified: Secondary | ICD-10-CM | POA: Diagnosis not present

## 2015-07-07 DIAGNOSIS — E1121 Type 2 diabetes mellitus with diabetic nephropathy: Secondary | ICD-10-CM | POA: Diagnosis not present

## 2015-07-07 MED ORDER — TELMISARTAN-HCTZ 80-25 MG PO TABS: 1.0000 | ORAL_TABLET | Freq: Every day | ORAL | Status: DC

## 2015-07-07 MED ORDER — CYANOCOBALAMIN 1000 MCG/ML IJ SOLN
1000.0000 ug | Freq: Once | INTRAMUSCULAR | Status: AC
  Administered 2015-07-07: 1000 ug via INTRAMUSCULAR

## 2015-07-07 MED ORDER — SITAGLIP PHOS-METFORMIN HCL ER 50-1000 MG PO TB24
2.0000 | ORAL_TABLET | Freq: Every day | ORAL | Status: DC
Start: 2015-07-07 — End: 2015-11-15

## 2015-07-07 MED ORDER — VALACYCLOVIR HCL 500 MG PO TABS: 500.0000 mg | ORAL_TABLET | Freq: Three times a day (TID) | ORAL | Status: DC

## 2015-07-07 MED ORDER — VENLAFAXINE HCL ER 75 MG PO CP24: 75.0000 mg | ORAL_CAPSULE | Freq: Every day | ORAL | Status: DC

## 2015-07-07 MED ORDER — DAPAGLIFLOZIN PROPANEDIOL 10 MG PO TABS: 10.0000 mg | ORAL_TABLET | Freq: Every day | ORAL | Status: DC

## 2015-07-07 MED ORDER — ATORVASTATIN CALCIUM 40 MG PO TABS: 40.0000 mg | ORAL_TABLET | Freq: Every evening | ORAL | Status: DC

## 2015-07-07 NOTE — Progress Notes (Signed)
Name: Wendy [name]   MRN: [id number]    DOB: May 29, 1960   Date:07/07/2015       Progress Note  Subjective  Chief Complaint  Chief Complaint  Patient presents with  . Annual Exam    HPI  Well Woman: she is married, she has some vaginal dryness, she has urinary incontinence  ( urge and stress), symptoms are going on for about one year. No dysuria, she has nocturia.   DM with nephropathy: she is taking medication as recommended, we stopped glipizide on her last visit because hgb A1C was down to 5.9%, however it has gone up to 8.1% now and we will add [place] - discussed possible side effects. Fsbs is around 120's fasting. Denies hypoglycemic episodes. She has noticed polyphagia - usually in am's, no polydipsia or polyuria.   HTN: taking medications as indicated, bp is at goal. No chest pain or SOB, but she has some palpitation.   Hyperlipidemia: taking Atorvastatin and denies side effects of medication  Menopausal symptoms: she has mood swings, hot flashes, night sweats, some vaginal dryness. She has been taking Effexor has improved symptoms.  Hot flashes and night sweats have resolved completely.   Snoring: she did not go to sleep study, but will re-schedule. She always feels tired. Explained importance of sleep study.    Patient Active Problem List   Diagnosis Date Noted  . Vitamin D deficiency 07/07/2015  . Vitamin B12 deficiency 07/07/2015  . Snoring 12/25/2014  . Allergic rhinitis, seasonal 12/14/2014  . Chronic constipation 12/14/2014  . Diabetes mellitus with renal manifestation ([place]) 12/14/2014  . Dyslipidemia 12/14/2014  . Dermatitis, eczematoid 12/14/2014  . Edema extremities 12/14/2014  . Essential (primary) hypertension 12/14/2014  . Gastro-esophageal reflux disease without esophagitis 12/14/2014  . Genital herpes in women 12/14/2014  . Extreme obesity ([place]) 12/14/2014  . Female climacteric state 12/14/2014    Past Surgical History  Procedure Laterality Date  .  Cesarean section  1993    Family History  Problem Relation Age of Onset  . Diabetes Mother   . Asthma Mother   . Heart disease Mother   . Cancer Father     prostate  . Heart disease Father     Social History   Social History  . Marital Status: Married    Spouse Name: N/A  . Number of Children: N/A  . Years of Education: N/A   Occupational History  . Not on file.   Social History Main Topics  . Smoking status: Never Smoker   . Smokeless tobacco: Never Used  . Alcohol Use: No  . Drug Use: No  . Sexual Activity: Yes   Other Topics Concern  . Not on file   Social History Narrative     Current outpatient prescriptions:  .  aspirin (ASPIRIN LOW DOSE) 81 MG chewable tablet, Chew 1 tablet by mouth daily., Disp: , Rfl:  .  atorvastatin (LIPITOR) 40 MG tablet, Take 1 tablet (40 mg total) by mouth every evening., Disp: 30 tablet, Rfl: 2 .  dapagliflozin propanediol (FARXIGA) 10 MG TABS tablet, Take 10 mg by mouth daily., Disp: 30 tablet, Rfl: 2 .  loratadine (CLARITIN) 10 MG tablet, Take 1 tablet (10 mg total) by mouth daily., Disp: 30 tablet, Rfl: 5 .  Polyethylene Glycol 3350 POWD, Take by mouth., Disp: , Rfl:  .  ranitidine (ZANTAC) 150 MG tablet, Take 1 tablet (150 mg total) by mouth daily as needed., Disp: 30 tablet, Rfl: 5 .  SitaGLIPtin-MetFORMIN HCl (JANUMET  XR) 50-1000 MG TB24, Take 2 tablets by mouth daily., Disp: 60 tablet, Rfl: 2 .  telmisartan-hydrochlorothiazide (MICARDIS HCT) 80-25 MG tablet, Take 1 tablet by mouth daily., Disp: 30 tablet, Rfl: 2 .  triamcinolone cream (KENALOG) 0.1 %, Apply 1 application topically as needed., Disp: , Rfl:  .  valACYclovir (VALTREX) 500 MG tablet, Take 1 tablet (500 mg total) by mouth 3 (three) times daily., Disp: 30 tablet, Rfl: 2 .  venlafaxine XR (EFFEXOR-XR) 75 MG 24 hr capsule, Take 1 capsule (75 mg total) by mouth daily., Disp: 30 capsule, Rfl: 2  Current facility-administered medications:  .  cyanocobalamin ((VITAMIN  B-12)) injection 1,000 mcg, 1,000 mcg, Intramuscular, Once, Steele Sizer, MD  Allergies  Allergen Reactions  . Penicillins Swelling     ROS  Constitutional: Negative for fever or weight change.  Respiratory: Negative for cough and shortness of breath.   Cardiovascular: Negative for chest pain, positive for  palpitations.  Gastrointestinal: Negative for abdominal pain, no bowel changes.  Musculoskeletal: Negative for gait problem or joint swelling.  Skin: Negative for rash.  Neurological: Negative for dizziness or headache.  No other specific complaints in a complete review of systems (except as listed in HPI above).  Objective  Filed Vitals:   07/07/15 1422  BP: 130/60  Pulse: 116  Temp: 98 F (36.7 C)  TempSrc: Oral  Resp: 16  Height: 5\' 3"  (1.6 m)  Weight: 259 lb (117.482 kg)  SpO2: 99%    Body mass index is 45.89 kg/(m^2).  Physical Exam  Constitutional: Patient appears well-developed and obese. No distress.  HENT: Head: Normocephalic and atraumatic. Ears: B TMs ok, no erythema or effusion; Nose: Nose normal. Mouth/Throat: Oropharynx is clear and moist. No oropharyngeal exudate.  Eyes: Conjunctivae and EOM are normal. Pupils are equal, round, and reactive to light. No scleral icterus.  Neck: Normal range of motion. Neck supple. No JVD present. No thyromegaly present.  Cardiovascular: Normal rate, regular rhythm and normal heart sounds.  No murmur heard. No BLE edema. Pulmonary/Chest: Effort normal and breath sounds normal. No respiratory distress. Abdominal: Soft. Bowel sounds are normal, no distension. There is no tenderness. no masses Breast: no lumps or masses, no nipple discharge or rashes FEMALE GENITALIA:  External genitalia normal External urethra normal Vaginal vault normal without discharge or lesions Cervix normal without discharge or lesions Bimanual exam normal without masses RECTAL: not done Musculoskeletal: Normal range of motion, no joint  effusions. No gross deformities Neurological: he is alert and oriented to person, place, and time. No cranial nerve deficit. Coordination, balance, strength, speech and gait are normal.  Skin: Skin is warm and dry. No rash noted. No erythema.  Psychiatric: Patient has a normal mood and affect. behavior is normal. Judgment and thought content normal.  PHQ2/9: Depression screen [facility] 2/9 07/07/2015 03/27/2015 12/25/2014  Decreased Interest 0 0 0  Down, Depressed, Hopeless 0 1 1  PHQ - 2 Score 0 1 1     Fall Risk: Fall Risk  07/07/2015 03/27/2015 12/25/2014  Falls in the past year? No No No     Functional Status Survey: Is the patient deaf or have difficulty hearing?: No Does the patient have difficulty seeing, even when wearing glasses/contacts?: No Does the patient have difficulty concentrating, remembering, or making decisions?: No Does the patient have difficulty walking or climbing stairs?: No Does the patient have difficulty dressing or bathing?: No Does the patient have difficulty doing errands alone such as visiting a doctor's office or shopping?: No  Assessment & Plan  1. Well woman exam  Discussed importance of 150 minutes of physical activity weekly, eat two servings of fish weekly, eat one serving of tree nuts ( cashews, pistachios, pecans, almonds.Marland Kitchen) every other day, eat 6 servings of fruit/vegetables daily and drink plenty of water and avoid sweet beverages.   2. Dyslipidemia  - atorvastatin (LIPITOR) 40 MG tablet; Take 1 tablet (40 mg total) by mouth every evening.  Dispense: 30 tablet; Refill: 2  3. Essential (primary) hypertension  - telmisartan-hydrochlorothiazide (MICARDIS HCT) 80-25 MG tablet; Take 1 tablet by mouth daily.  Dispense: 30 tablet; Refill: 2 - Comprehensive metabolic panel  4. Type 2 diabetes mellitus with diabetic nephropathy, without long-term current use of insulin (HCC)  - SitaGLIPtin-MetFORMIN HCl (JANUMET XR) 50-1000 MG TB24; Take 2 tablets by  mouth daily.  Dispense: 60 tablet; Refill: 2 - dapagliflozin propanediol (FARXIGA) 10 MG TABS tablet; Take 10 mg by mouth daily.  Dispense: 30 tablet; Refill: 2 - Hemoglobin A1c  5. Vitamin D deficiency  - VITAMIN D 25 Hydroxy (Vit-D Deficiency, Fractures)  6. Vitamin B12 deficiency  - Vitamin B12 - cyanocobalamin ((VITAMIN B-12)) injection 1,000 mcg; Inject 1 mL (1,000 mcg total) into the muscle once.  7. Colon cancer screening  - Ambulatory referral to Gastroenterology  8. Female climacteric state  - venlafaxine XR (EFFEXOR-XR) 75 MG 24 hr capsule; Take 1 capsule (75 mg total) by mouth daily.  Dispense: 30 capsule; Refill: 2  9. Cervical cancer screening  - Pap IG, CT/NG NAA, and HPV (high risk)  10. Tachycardia  - CBC with Differential/Platelet - TSH

## 2015-07-09 LAB — URINE CULTURE

## 2015-07-13 LAB — PAP IG, CT-NG NAA, HPV HIGH-RISK
Chlamydia, Nuc. Acid Amp: NEGATIVE
GONOCOCCUS BY NUCLEIC ACID AMP: NEGATIVE
HPV, HIGH-RISK: NEGATIVE
PAP Smear Comment: 0

## 2015-07-17 ENCOUNTER — Ambulatory Visit: Payer: BLUE CROSS/BLUE SHIELD | Admitting: Family Medicine

## 2015-08-18 ENCOUNTER — Ambulatory Visit (INDEPENDENT_AMBULATORY_CARE_PROVIDER_SITE_OTHER): Payer: BLUE CROSS/BLUE SHIELD

## 2015-08-18 DIAGNOSIS — D519 Vitamin B12 deficiency anemia, unspecified: Secondary | ICD-10-CM

## 2015-08-18 MED ORDER — CYANOCOBALAMIN 1000 MCG/ML IJ SOLN
1000.0000 ug | Freq: Once | INTRAMUSCULAR | Status: AC
  Administered 2015-08-18: 1000 ug via INTRAMUSCULAR

## 2015-11-15 ENCOUNTER — Other Ambulatory Visit: Payer: Self-pay | Admitting: Family Medicine

## 2015-11-15 DIAGNOSIS — E1121 Type 2 diabetes mellitus with diabetic nephropathy: Secondary | ICD-10-CM

## 2015-11-16 NOTE — Telephone Encounter (Signed)
Left voice message for patient to schedule appointment and that prescription is at the pharmacy

## 2015-12-09 ENCOUNTER — Ambulatory Visit (INDEPENDENT_AMBULATORY_CARE_PROVIDER_SITE_OTHER): Payer: BLUE CROSS/BLUE SHIELD | Admitting: Family Medicine

## 2015-12-09 ENCOUNTER — Encounter: Payer: Self-pay | Admitting: Family Medicine

## 2015-12-09 VITALS — BP 136/72 | HR 92 | Temp 98.2°F | Resp 16 | Ht 63.0 in | Wt 258.5 lb

## 2015-12-09 DIAGNOSIS — N951 Menopausal and female climacteric states: Secondary | ICD-10-CM | POA: Diagnosis not present

## 2015-12-09 DIAGNOSIS — E785 Hyperlipidemia, unspecified: Secondary | ICD-10-CM

## 2015-12-09 DIAGNOSIS — B354 Tinea corporis: Secondary | ICD-10-CM | POA: Diagnosis not present

## 2015-12-09 DIAGNOSIS — Z1239 Encounter for other screening for malignant neoplasm of breast: Secondary | ICD-10-CM

## 2015-12-09 DIAGNOSIS — E538 Deficiency of other specified B group vitamins: Secondary | ICD-10-CM

## 2015-12-09 DIAGNOSIS — E1121 Type 2 diabetes mellitus with diabetic nephropathy: Secondary | ICD-10-CM | POA: Diagnosis not present

## 2015-12-09 DIAGNOSIS — H6123 Impacted cerumen, bilateral: Secondary | ICD-10-CM | POA: Diagnosis not present

## 2015-12-09 DIAGNOSIS — E559 Vitamin D deficiency, unspecified: Secondary | ICD-10-CM | POA: Diagnosis not present

## 2015-12-09 DIAGNOSIS — I10 Essential (primary) hypertension: Secondary | ICD-10-CM | POA: Diagnosis not present

## 2015-12-09 DIAGNOSIS — Z862 Personal history of diseases of the blood and blood-forming organs and certain disorders involving the immune mechanism: Secondary | ICD-10-CM

## 2015-12-09 LAB — COMPLETE METABOLIC PANEL WITH GFR
ALT: 18 U/L (ref 6–29)
AST: 17 U/L (ref 10–35)
Albumin: 4.1 g/dL (ref 3.6–5.1)
Alkaline Phosphatase: 130 U/L (ref 33–130)
BUN: 11 mg/dL (ref 7–25)
CHLORIDE: 102 mmol/L (ref 98–110)
CO2: 29 mmol/L (ref 20–31)
Calcium: 9.5 mg/dL (ref 8.6–10.4)
Creat: 0.7 mg/dL (ref 0.50–1.05)
GLUCOSE: 158 mg/dL — AB (ref 65–99)
POTASSIUM: 3.8 mmol/L (ref 3.5–5.3)
SODIUM: 141 mmol/L (ref 135–146)
Total Bilirubin: 0.4 mg/dL (ref 0.2–1.2)
Total Protein: 6.7 g/dL (ref 6.1–8.1)

## 2015-12-09 LAB — CBC WITH DIFFERENTIAL/PLATELET
BASOS PCT: 0 %
Basophils Absolute: 0 cells/uL (ref 0–200)
EOS ABS: 198 {cells}/uL (ref 15–500)
Eosinophils Relative: 2 %
HEMATOCRIT: 40.2 % (ref 35.0–45.0)
HEMOGLOBIN: 13.3 g/dL (ref 11.7–15.5)
LYMPHS ABS: 3267 {cells}/uL (ref 850–3900)
LYMPHS PCT: 33 %
MCH: 27.8 pg (ref 27.0–33.0)
MCHC: 33.1 g/dL (ref 32.0–36.0)
MCV: 84.1 fL (ref 80.0–100.0)
MONO ABS: 594 {cells}/uL (ref 200–950)
MPV: 10.9 fL (ref 7.5–12.5)
Monocytes Relative: 6 %
NEUTROS ABS: 5841 {cells}/uL (ref 1500–7800)
Neutrophils Relative %: 59 %
Platelets: 302 10*3/uL (ref 140–400)
RBC: 4.78 MIL/uL (ref 3.80–5.10)
RDW: 14.9 % (ref 11.0–15.0)
WBC: 9.9 10*3/uL (ref 3.8–10.8)

## 2015-12-09 LAB — LIPID PANEL
Cholesterol: 166 mg/dL (ref 125–200)
HDL: 52 mg/dL (ref >=46)
LDL CALC: 94 mg/dL (ref <130)
TRIGLYCERIDES: 100 mg/dL (ref <150)
Total CHOL/HDL Ratio: 3.2 Ratio (ref <=5.0)
VLDL: 20 mg/dL (ref <30)

## 2015-12-09 LAB — POCT GLYCOSYLATED HEMOGLOBIN (HGB A1C): HEMOGLOBIN A1C: 8

## 2015-12-09 LAB — VITAMIN B12: Vitamin B-12: 315 pg/mL (ref 200–1100)

## 2015-12-09 MED ORDER — DULAGLUTIDE 1.5 MG/0.5ML ~~LOC~~ SOAJ: 1.5000 mg | SUBCUTANEOUS | 2 refills | Status: DC

## 2015-12-09 MED ORDER — DAPAGLIFLOZIN PRO-METFORMIN ER 5-1000 MG PO TB24: 2.0000 | ORAL_TABLET | Freq: Every day | ORAL | 2 refills | Status: DC

## 2015-12-09 MED ORDER — VENLAFAXINE HCL ER 75 MG PO CP24: 75.0000 mg | ORAL_CAPSULE | Freq: Every day | ORAL | 2 refills | Status: DC

## 2015-12-09 MED ORDER — TELMISARTAN-HCTZ 80-25 MG PO TABS: 1.0000 | ORAL_TABLET | Freq: Every day | ORAL | 2 refills | Status: DC

## 2015-12-09 MED ORDER — ATORVASTATIN CALCIUM 40 MG PO TABS: 40.0000 mg | ORAL_TABLET | Freq: Every evening | ORAL | 2 refills | Status: DC

## 2015-12-09 MED ORDER — KETOCONAZOLE 2 % EX CREA: 1.0000 "application " | TOPICAL_CREAM | Freq: Two times a day (BID) | CUTANEOUS | 0 refills | Status: DC

## 2015-12-09 NOTE — Progress Notes (Signed)
Name: Wendy [name]   MRN: [id number]    DOB: [birth year]   Date:12/09/2015       Progress Note  Subjective  Chief Complaint  Chief Complaint  Patient presents with  . Medication Refill  . Diabetes    Checks every so often, BS Averages 120's  . Hypertension  . Hyperlipidemia    Patient forgot to take medication for awhile but just recently started back  . Depression    Well controlled  . Gastroesophageal Reflux    Stable    HPI   DM with nephropathy: she is taking medication as recommended, last hgbA1C was 8.1% and we added Farxiga, however she has been out of medication for over one month. HgbA1C is still elevated at 8.0%- discussed adding Trulicity and changing from Janumet to [employer] is around 120's fasting. Denies hypoglycemic episodes. She has noticed polyphagia - usually in am's, no polydipsia or polyuria.   HTN: taking medications as indicated, bp is at goal. No chest pain or SOB, she also states palpitation has resolved  Hyperlipidemia: taking Atorvastatin now, but she states she had forgotten to take it for a period of time. She side effects of medication  Menopausal symptoms: she has mood swings, hot flashes, night sweats, some vaginal dryness. She has been taking Effexor has improved symptoms.  Hot flashes and night sweats have resolved completely.   Snoring: she did not go to sleep study, and she did not re-schedule it. She always feels tired. Explained importance of sleep study. There is an increase risk of heart attacks and strokes in patient with sleep apnea that are not treated.   B12 and vitamin D deficiency: not currently taking supplementation  Breast cancer screen: I will arrange mammogram  Extremity obesity: weight has been stable, she skips meals, but likes sweets, eating a large meal once a day. She states if she eats during the day she gets very sleepy.   Patient Active Problem List   Diagnosis Date Noted  . Vitamin D deficiency 07/07/2015  .  Vitamin B12 deficiency 07/07/2015  . Snoring 12/25/2014  . Allergic rhinitis, seasonal 12/14/2014  . Chronic constipation 12/14/2014  . Diabetes mellitus with renal manifestation ([place]) 12/14/2014  . Dyslipidemia 12/14/2014  . Dermatitis, eczematoid 12/14/2014  . Edema extremities 12/14/2014  . Essential (primary) hypertension 12/14/2014  . Gastro-esophageal reflux disease without esophagitis 12/14/2014  . Genital herpes in women 12/14/2014  . Extreme obesity ([place]) 12/14/2014  . Female climacteric state 12/14/2014    Past Surgical History:  Procedure Laterality Date  . CESAREAN SECTION  1993    Family History  Problem Relation Age of Onset  . Diabetes Mother   . Asthma Mother   . Heart disease Mother   . Cancer Father     prostate  . Heart disease Father     Social History   Social History  . Marital status: Married    Spouse name: N/A  . Number of children: N/A  . Years of education: N/A   Occupational History  . Not on file.   Social History Main Topics  . Smoking status: Never Smoker  . Smokeless tobacco: Never Used  . Alcohol use No  . Drug use: No  . Sexual activity: Yes   Other Topics Concern  . Not on file   Social History Narrative  . No narrative on file     Current Outpatient Prescriptions:  .  aspirin (ASPIRIN LOW DOSE) 81 MG chewable tablet, Chew 1 tablet  by mouth daily., Disp: , Rfl:  .  atorvastatin (LIPITOR) 40 MG tablet, Take 1 tablet (40 mg total) by mouth every evening., Disp: 30 tablet, Rfl: 2 .  loratadine (CLARITIN) 10 MG tablet, Take 1 tablet (10 mg total) by mouth daily., Disp: 30 tablet, Rfl: 5 .  Polyethylene Glycol 3350 POWD, Take by mouth., Disp: , Rfl:  .  ranitidine (ZANTAC) 150 MG tablet, Take 1 tablet (150 mg total) by mouth daily as needed., Disp: 30 tablet, Rfl: 5 .  telmisartan-hydrochlorothiazide (MICARDIS HCT) 80-25 MG tablet, Take 1 tablet by mouth daily., Disp: 30 tablet, Rfl: 2 .  valACYclovir (VALTREX) 500 MG  tablet, Take 1 tablet (500 mg total) by mouth 3 (three) times daily., Disp: 30 tablet, Rfl: 2 .  venlafaxine XR (EFFEXOR-XR) 75 MG 24 hr capsule, Take 1 capsule (75 mg total) by mouth daily., Disp: 30 capsule, Rfl: 2 .  Dapagliflozin-Metformin HCl ER (XIGDUO XR) 08-998 MG TB24, Take 2 tablets by mouth daily., Disp: 60 tablet, Rfl: 2 .  Dulaglutide (TRULICITY) 1.5 0000000 SOPN, Inject 1.5 mg into the skin once a week., Disp: 4 pen, Rfl: 2  Allergies  Allergen Reactions  . Penicillins Swelling     ROS  Constitutional: Negative for fever or weight change.  Respiratory: Negative for cough and shortness of breath.   Cardiovascular: Negative for chest pain or palpitations.  Gastrointestinal: Negative for abdominal pain, no bowel changes.  Musculoskeletal: Negative for gait problem or joint swelling.  Skin: Negative for rash. She had a rash a couple of weeks ago, but resolved with Valtrex- possible shingles Neurological: Negative for dizziness or headache.  No other specific complaints in a complete review of systems (except as listed in HPI above).  Objective  Vitals:   12/09/15 0955  BP: 136/72  Pulse: 92  Resp: 16  Temp: 98.2 F (36.8 C)  TempSrc: Oral  SpO2: 97%  Weight: 258 lb 8 oz (117.3 kg)  Height: 5\' 3"  (1.6 m)    Body mass index is 45.79 kg/m.  Physical Exam  Constitutional: Patient appears well-developed and well-nourished. Obese  No distress.  HEENT: head atraumatic, normocephalic, pupils equal and reactive to light,  neck supple, throat within normal limits Cardiovascular: Normal rate, regular rhythm and normal heart sounds.  No murmur heard. No BLE edema. Pulmonary/Chest: Effort normal and breath sounds normal. No respiratory distress. Abdominal: Soft.  There is no tenderness. Psychiatric: Patient has a normal mood and affect. behavior is normal. Judgment and thought content normal. Skin: healing hyperpigmented area on right buttocks, also a round lesion on  right nuchal area, clear in the center, likely fungal   Recent Results (from the past 2160 hour(s))  POCT HgB A1C     Status: Abnormal   Collection Time: 12/09/15  9:57 AM  Result Value Ref Range   Hemoglobin A1C 8.0     Diabetic Foot Exam: Diabetic Foot Exam - Simple   Simple Foot Form Diabetic Foot exam was performed with the following findings:  Yes 12/09/2015 10:43 AM  Visual Inspection No deformities, no ulcerations, no other skin breakdown bilaterally:  Yes Sensation Testing Intact to touch and monofilament testing bilaterally:  Yes Pulse Check Posterior Tibialis and Dorsalis pulse intact bilaterally:  Yes Comments      PHQ2/9: Depression screen [facility] 2/9 12/09/2015 07/07/2015 03/27/2015 12/25/2014  Decreased Interest 0 0 0 0  Down, Depressed, Hopeless 0 0 1 1  PHQ - 2 Score 0 0 1 1     Fall  Risk: Fall Risk  12/09/2015 07/07/2015 03/27/2015 12/25/2014  Falls in the past year? No No No No     Functional Status Survey: Is the patient deaf or have difficulty hearing?: No Does the patient have difficulty seeing, even when wearing glasses/contacts?: No Does the patient have difficulty concentrating, remembering, or making decisions?: No Does the patient have difficulty walking or climbing stairs?: No Does the patient have difficulty dressing or bathing?: No Does the patient have difficulty doing errands alone such as visiting a doctor's office or shopping?: No   Assessment & Plan  1. Type 2 diabetes mellitus with diabetic nephropathy, without long-term current use of insulin (HCC)  - POCT HgB A1C - Dapagliflozin-Metformin HCl ER (XIGDUO XR) 08-998 MG TB24; Take 2 tablets by mouth daily.  Dispense: 60 tablet; Refill: 2 - Dulaglutide (TRULICITY) 1.5 0000000 SOPN; Inject 1.5 mg into the skin once a week.  Dispense: 4 pen; Refill: 2  2. Dyslipidemia  - atorvastatin (LIPITOR) 40 MG tablet; Take 1 tablet (40 mg total) by mouth every evening.  Dispense: 30 tablet; Refill: 2 -  Lipid panel  3. Essential (primary) hypertension  - telmisartan-hydrochlorothiazide (MICARDIS HCT) 80-25 MG tablet; Take 1 tablet by mouth daily.  Dispense: 30 tablet; Refill: 2 - COMPLETE METABOLIC PANEL WITH GFR  4. Vitamin D deficiency  - VITAMIN D 25 Hydroxy (Vit-D Deficiency, Fractures)  5. Vitamin B12 deficiency  - Vitamin B12  6. Female climacteric state  - venlafaxine XR (EFFEXOR-XR) 75 MG 24 hr capsule; Take 1 capsule (75 mg total) by mouth daily.  Dispense: 30 capsule; Refill: 2  7. History of anemia  - CBC with Differential/Platelet  8. Breast cancer screening  - MM Digital Screening; Future  9. Tinea corporis  - ketoconazole (NIZORAL) 2 % cream; Apply 1 application topically 2 (two) times daily.  Dispense: 30 g; Refill: 0  10. Cerumen impaction, bilateral  - Ear Lavage

## 2015-12-09 NOTE — Patient Instructions (Signed)
When you are out of Janumet Get rx of Xigduo filled and take two daily

## 2015-12-10 LAB — VITAMIN D 25 HYDROXY (VIT D DEFICIENCY, FRACTURES): Vit D, 25-Hydroxy: 14 ng/mL — ABNORMAL LOW (ref 30–100)

## 2015-12-11 ENCOUNTER — Other Ambulatory Visit: Payer: Self-pay | Admitting: Family Medicine

## 2015-12-11 DIAGNOSIS — E559 Vitamin D deficiency, unspecified: Secondary | ICD-10-CM

## 2015-12-11 MED ORDER — VITAMIN D (ERGOCALCIFEROL) 1.25 MG (50000 UNIT) PO CAPS: 50000.0000 [IU] | ORAL_CAPSULE | ORAL | 0 refills | Status: DC

## 2015-12-15 ENCOUNTER — Ambulatory Visit
Admission: RE | Admit: 2015-12-15 | Discharge: 2015-12-15 | Disposition: A | Discharge status: Home / Self Care | Payer: BLUE CROSS/BLUE SHIELD | Source: Ambulatory Visit | Attending: Family Medicine | Admitting: Family Medicine

## 2015-12-15 ENCOUNTER — Other Ambulatory Visit: Payer: Self-pay | Admitting: Family Medicine

## 2015-12-15 DIAGNOSIS — Z1239 Encounter for other screening for malignant neoplasm of breast: Secondary | ICD-10-CM

## 2015-12-15 DIAGNOSIS — Z1231 Encounter for screening mammogram for malignant neoplasm of breast: Secondary | ICD-10-CM | POA: Diagnosis not present

## 2016-01-10 ENCOUNTER — Other Ambulatory Visit: Payer: Self-pay | Admitting: Family Medicine

## 2016-01-10 DIAGNOSIS — E1121 Type 2 diabetes mellitus with diabetic nephropathy: Secondary | ICD-10-CM

## 2016-02-17 ENCOUNTER — Other Ambulatory Visit: Payer: Self-pay | Admitting: Family Medicine

## 2016-02-17 DIAGNOSIS — J302 Other seasonal allergic rhinitis: Secondary | ICD-10-CM

## 2016-02-29 LAB — HM DIABETES EYE EXAM

## 2016-03-01 ENCOUNTER — Encounter: Payer: Self-pay | Admitting: Family Medicine

## 2016-03-01 DIAGNOSIS — IMO0002 Reserved for concepts with insufficient information to code with codable children: Secondary | ICD-10-CM | POA: Insufficient documentation

## 2016-03-01 DIAGNOSIS — E113299 Type 2 diabetes mellitus with mild nonproliferative diabetic retinopathy without macular edema, unspecified eye: Secondary | ICD-10-CM | POA: Insufficient documentation

## 2016-03-01 DIAGNOSIS — E1165 Type 2 diabetes mellitus with hyperglycemia: Secondary | ICD-10-CM

## 2016-03-01 DIAGNOSIS — E1169 Type 2 diabetes mellitus with other specified complication: Secondary | ICD-10-CM | POA: Insufficient documentation

## 2016-03-07 ENCOUNTER — Ambulatory Visit: Payer: BLUE CROSS/BLUE SHIELD | Admitting: Family Medicine

## 2016-03-25 ENCOUNTER — Ambulatory Visit (INDEPENDENT_AMBULATORY_CARE_PROVIDER_SITE_OTHER): Payer: BLUE CROSS/BLUE SHIELD | Admitting: Family Medicine

## 2016-03-25 ENCOUNTER — Encounter: Payer: Self-pay | Admitting: Family Medicine

## 2016-03-25 ENCOUNTER — Ambulatory Visit: Payer: BLUE CROSS/BLUE SHIELD | Admitting: Family Medicine

## 2016-03-25 VITALS — BP 134/70 | HR 100 | Temp 98.9°F | Resp 18 | Ht 63.0 in | Wt 265.2 lb

## 2016-03-25 DIAGNOSIS — I10 Essential (primary) hypertension: Secondary | ICD-10-CM | POA: Diagnosis not present

## 2016-03-25 DIAGNOSIS — N951 Menopausal and female climacteric states: Secondary | ICD-10-CM

## 2016-03-25 DIAGNOSIS — Z23 Encounter for immunization: Secondary | ICD-10-CM

## 2016-03-25 DIAGNOSIS — E559 Vitamin D deficiency, unspecified: Secondary | ICD-10-CM | POA: Diagnosis not present

## 2016-03-25 DIAGNOSIS — E669 Obesity, unspecified: Secondary | ICD-10-CM

## 2016-03-25 DIAGNOSIS — E668 Other obesity: Secondary | ICD-10-CM

## 2016-03-25 DIAGNOSIS — E113299 Type 2 diabetes mellitus with mild nonproliferative diabetic retinopathy without macular edema, unspecified eye: Secondary | ICD-10-CM

## 2016-03-25 DIAGNOSIS — E785 Hyperlipidemia, unspecified: Secondary | ICD-10-CM | POA: Diagnosis not present

## 2016-03-25 DIAGNOSIS — E1165 Type 2 diabetes mellitus with hyperglycemia: Secondary | ICD-10-CM

## 2016-03-25 DIAGNOSIS — E1121 Type 2 diabetes mellitus with diabetic nephropathy: Secondary | ICD-10-CM

## 2016-03-25 DIAGNOSIS — A6004 Herpesviral vulvovaginitis: Secondary | ICD-10-CM

## 2016-03-25 DIAGNOSIS — R21 Rash and other nonspecific skin eruption: Secondary | ICD-10-CM

## 2016-03-25 DIAGNOSIS — E538 Deficiency of other specified B group vitamins: Secondary | ICD-10-CM

## 2016-03-25 DIAGNOSIS — R1013 Epigastric pain: Secondary | ICD-10-CM

## 2016-03-25 DIAGNOSIS — IMO0002 Reserved for concepts with insufficient information to code with codable children: Secondary | ICD-10-CM

## 2016-03-25 LAB — POCT GLYCOSYLATED HEMOGLOBIN (HGB A1C): Hemoglobin A1C: 9.1

## 2016-03-25 LAB — POCT UA - MICROALBUMIN: MICROALBUMIN (UR) POC: 0.5 mg/L

## 2016-03-25 MED ORDER — VITAMIN D 50 MCG (2000 UT) PO CAPS: 1.0000 | ORAL_CAPSULE | Freq: Every day | ORAL | 0 refills | Status: DC

## 2016-03-25 MED ORDER — TELMISARTAN-HCTZ 80-25 MG PO TABS: 1.0000 | ORAL_TABLET | Freq: Every day | ORAL | 0 refills | Status: DC

## 2016-03-25 MED ORDER — VENLAFAXINE HCL ER 75 MG PO CP24: 75.0000 mg | ORAL_CAPSULE | Freq: Every day | ORAL | 0 refills | Status: DC

## 2016-03-25 MED ORDER — SITAGLIP PHOS-METFORMIN HCL ER 50-1000 MG PO TB24: 2.0000 | ORAL_TABLET | Freq: Every day | ORAL | 0 refills | Status: DC

## 2016-03-25 MED ORDER — B-12 (METHYLCOBALAMIN) 1000 MCG SL SUBL: 1.0000 | SUBLINGUAL_TABLET | Freq: Every day | SUBLINGUAL | 0 refills | Status: DC

## 2016-03-25 MED ORDER — DAPAGLIFLOZIN PRO-METFORMIN ER 5-1000 MG PO TB24: 2.0000 | ORAL_TABLET | Freq: Every day | ORAL | 2 refills | Status: DC

## 2016-03-25 MED ORDER — VALACYCLOVIR HCL 500 MG PO TABS: 500.0000 mg | ORAL_TABLET | Freq: Three times a day (TID) | ORAL | 0 refills | Status: DC

## 2016-03-25 MED ORDER — DULAGLUTIDE 1.5 MG/0.5ML ~~LOC~~ SOAJ: 1.5000 mg | SUBCUTANEOUS | 2 refills | Status: DC

## 2016-03-25 MED ORDER — OMEPRAZOLE 40 MG PO CPDR: 40.0000 mg | DELAYED_RELEASE_CAPSULE | Freq: Every day | ORAL | 0 refills | Status: DC

## 2016-03-25 MED ORDER — ATORVASTATIN CALCIUM 40 MG PO TABS: 40.0000 mg | ORAL_TABLET | Freq: Every evening | ORAL | 2 refills | Status: DC

## 2016-03-25 NOTE — Progress Notes (Signed)
Name: Wendy [name]   MRN: [id number]    DOB: 09/13/1960   Date:03/25/2016       Progress Note  Subjective  Chief Complaint  Chief Complaint  Patient presents with  . Medication Refill    3 month F/U  . Diabetes    Having reaction to medication, itchy and spots all over her legs. Checks sugar every so often.  . Hyperlipidemia    Denies any symptoms, no medication was sent to pharmacy. needs refill.   . Hypertension  . Depression    Stable  . Gastroesophageal Reflux    Well controlled with medication as needed     HPI   DM with nephropathy, retinopathy: she stopped taking Trulicity because  last hgbA1C was 8.1%  And it now up to 9.1%. She is up to date with eye exam, on ARB. She seems to be very confused about her medications. Not much understanding about her diet, we will send her to dietician and return in 6 weeks for medication review and sugar log review  HTN: taking medications as indicated, bp is at goal. No chest pain, palpitation  or SOB  Hyperlipidemia: she is off Atorvastatin again, states it was not at the pharmacy, but I sent rx in August with 2 refills  Snoring: she did not go to sleep study, and she did not re-schedule it. She always feels tired. Explained importance of sleep study. There is an increase risk of heart attacks and strokes in patient with sleep apnea that are not treated.   B12 and vitamin D deficiency: not currently taking supplementation, explained importance of resuming it  Epigastric: not taking medications, she states has epigastric pain, can't explain what kind of pain. No nausea. Asked if she is over eating but she declines, no heartburn or regurgitation, no change in bowel movements or blood in stools.   Extremity obesity: weight has been stable, she skips meals, but likes sweets, eating a large meal once a day. She states if she eats during the day she gets very sleepy. Snaking on peanut butter crackers while at work, and a fruit.   Patient  Active Problem List   Diagnosis Date Noted  . Uncontrolled type 2 diabetes mellitus with nonproliferative retinopathy ([place]) 03/01/2016  . Vitamin D deficiency 07/07/2015  . Vitamin B12 deficiency 07/07/2015  . Snoring 12/25/2014  . Allergic rhinitis, seasonal 12/14/2014  . Chronic constipation 12/14/2014  . Diabetes mellitus with renal manifestation ([place]) 12/14/2014  . Dyslipidemia 12/14/2014  . Dermatitis, eczematoid 12/14/2014  . Edema extremities 12/14/2014  . Essential (primary) hypertension 12/14/2014  . Gastro-esophageal reflux disease without esophagitis 12/14/2014  . Genital herpes in women 12/14/2014  . Extreme obesity ([place]) 12/14/2014  . Female climacteric state 12/14/2014    Past Surgical History:  Procedure Laterality Date  . CESAREAN SECTION  1993    Family History  Problem Relation Age of Onset  . Diabetes Mother   . Asthma Mother   . Heart disease Mother   . Cancer Father     prostate  . Heart disease Father   . Breast cancer Neg Hx     Social History   Social History  . Marital status: Married    Spouse name: N/A  . Number of children: N/A  . Years of education: N/A   Occupational History  . Not on file.   Social History Main Topics  . Smoking status: Never Smoker  . Smokeless tobacco: Never Used  . Alcohol use No  .  Drug use: No  . Sexual activity: Yes   Other Topics Concern  . Not on file   Social History Narrative  . No narrative on file     Current Outpatient Prescriptions:  .  aspirin (ASPIRIN LOW DOSE) 81 MG chewable tablet, Chew 1 tablet by mouth daily., Disp: , Rfl:  .  Dapagliflozin-Metformin HCl ER (XIGDUO XR) 08-998 MG TB24, Take 2 tablets by mouth daily., Disp: 60 tablet, Rfl: 2 .  Dulaglutide (TRULICITY) 1.5 0000000 SOPN, Inject 1.5 mg into the skin once a week., Disp: 4 pen, Rfl: 2 .  ketoconazole (NIZORAL) 2 % cream, Apply 1 application topically 2 (two) times daily., Disp: 30 g, Rfl: 0 .  loratadine (CLARITIN) 10 MG  tablet, TAKE 1 TABLET (10 MG TOTAL) BY MOUTH DAILY., Disp: 30 tablet, Rfl: 3 .  Polyethylene Glycol 3350 POWD, Take by mouth., Disp: , Rfl:  .  telmisartan-hydrochlorothiazide (MICARDIS HCT) 80-25 MG tablet, Take 1 tablet by mouth daily., Disp: 90 tablet, Rfl: 0 .  valACYclovir (VALTREX) 500 MG tablet, Take 1 tablet (500 mg total) by mouth 3 (three) times daily., Disp: 90 tablet, Rfl: 0 .  venlafaxine XR (EFFEXOR-XR) 75 MG 24 hr capsule, Take 1 capsule (75 mg total) by mouth daily., Disp: 90 capsule, Rfl: 0 .  atorvastatin (LIPITOR) 40 MG tablet, Take 1 tablet (40 mg total) by mouth every evening., Disp: 30 tablet, Rfl: 2 .  B-12, Methylcobalamin, 1000 MCG SUBL, Place 1 tablet under the tongue daily., Disp: 30 tablet, Rfl: 0 .  Cholecalciferol (VITAMIN D) 2000 units CAPS, Take 1 capsule (2,000 Units total) by mouth daily., Disp: 30 capsule, Rfl: 0 .  omeprazole (PRILOSEC) 40 MG capsule, Take 1 capsule (40 mg total) by mouth daily., Disp: 30 capsule, Rfl: 0  Allergies  Allergen Reactions  . Penicillins Swelling     ROS  Constitutional: Negative for fever , gained weight change.  Respiratory: Negative for cough and shortness of breath.   Cardiovascular: Negative for chest pain or palpitations.  Gastrointestinal: Negative for abdominal pain, no bowel changes.  Musculoskeletal: Negative for gait problem or joint swelling.  Skin: Negative for rash.  Neurological: Negative for dizziness or headache.  No other specific complaints in a complete review of systems (except as listed in HPI above).  Objective  Vitals:   03/25/16 1511  BP: 134/70  Pulse: 100  Resp: 18  Temp: 98.9 F (37.2 C)  TempSrc: Oral  SpO2: 97%  Weight: 265 lb 3.2 oz (120.3 kg)  Height: 5\' 3"  (1.6 m)    Body mass index is 46.98 kg/m.  Physical Exam  Constitutional: Patient appears well-developed and well-nourished. Obese  No distress.  HEENT: head atraumatic, normocephalic, pupils equal and reactive to  light,neck supple, throat within normal limits Cardiovascular: Normal rate, regular rhythm and normal heart sounds.  No murmur heard. No BLE edema. Pulmonary/Chest: Effort normal and breath sounds normal. No respiratory distress. Abdominal: Soft.  There is no tenderness. Psychiatric: Patient has a normal mood and affect. behavior is normal. Judgment and thought content normal. Skin: nodular rash, not between finger webs, under breast or groin, mostly upper back, and lower legs.   Recent Results (from the past 2160 hour(s))  HM DIABETES EYE EXAM     Status: Abnormal   Collection Time: 02/29/16 12:00 AM  Result Value Ref Range   HM Diabetic Eye Exam Retinopathy (A) No Retinopathy    Comment: Mild Nonproliferative-2smalldotblothernesOD.Dr.Kevin Ritter   POCT HgB A1C  Status: Abnormal   Collection Time: 03/25/16  3:24 PM  Result Value Ref Range   Hemoglobin A1C 9.1   POCT UA - Microalbumin     Status: Abnormal   Collection Time: 03/25/16  3:34 PM  Result Value Ref Range   Microalbumin Ur, POC 0.50 mg/L   Creatinine, POC  mg/dL   Albumin/Creatinine Ratio, Urine, POC       PHQ2/9: Depression screen [facility] 2/9 03/25/2016 12/09/2015 07/07/2015 03/27/2015 12/25/2014  Decreased Interest 0 0 0 0 0  Down, Depressed, Hopeless 0 0 0 1 1  PHQ - 2 Score 0 0 0 1 1     Fall Risk: Fall Risk  03/25/2016 12/09/2015 07/07/2015 03/27/2015 12/25/2014  Falls in the past year? No No No No No    Functional Status Survey: Is the patient deaf or have difficulty hearing?: No Does the patient have difficulty seeing, even when wearing glasses/contacts?: No Does the patient have difficulty concentrating, remembering, or making decisions?: No Does the patient have difficulty walking or climbing stairs?: No Does the patient have difficulty dressing or bathing?: No Does the patient have difficulty doing errands alone such as visiting a doctor's office or shopping?: No   Assessment & Plan  1. Type 2 diabetes  mellitus with diabetic nephropathy, without long-term current use of insulin (HCC)  - POCT HgB A1C 9.1 - pit pf cpmtrp;  - POCT UA - Microalbumin 50 - stay on ARB - SitaGLIPtin-MetFORMIN HCl (JANUMET XR) 50-1000 MG TB24; Take 2 tablets by mouth daily.  Dispense: 180 tablet; Refill: 0 - Dulaglutide (TRULICITY) 1.5 0000000 SOPN; Inject 1.5 mg into the skin once a week.  Dispense: 4 pen; Refill: 2  She states she will resume Trulicity and discussed referral to dietician but she states she will run out of insurance for a short period of time.   [facility] - Dapagliflozin-Metformin HCl ER (XIGDUO XR) 08-998 MG TB24; Take 2 tablets by mouth daily.  Dispense: 60 tablet; Refill: 2  2. Essential (primary) hypertension  At goal , HCTZ can be the cause of her rash and discussed stopping it but she wants to hold off for now - telmisartan-hydrochlorothiazide (MICARDIS HCT) 80-25 MG tablet; Take 1 tablet by mouth daily.  Dispense: 90 tablet; Refill: 0  3. Dyslipidemia  Refill rx of Atorvastatin  4. Vitamin B12 deficiency  Needs to take otc supplementation   5. Vitamin D deficiency  Needs to take otc supplementation   6. Extreme obesity ([place])  Discussed with the patient the risk posed by an increased BMI. Discussed importance of portion control, calorie counting and at least 150 minutes of physical activity weekly. Avoid sweet beverages and drink more water. Eat at least 6 servings of fruit and vegetables daily   7. Rash and nonspecific skin eruption  -refer to Dermatologist   8. Female climacteric state  - venlafaxine XR (EFFEXOR-XR) 75 MG 24 hr capsule; Take 1 capsule (75 mg total) by mouth daily.  Dispense: 90 capsule; Refill: 0  9. Epigastric pain  - omeprazole (PRILOSEC) 40 MG capsule; Take 1 capsule (40 mg total) by mouth daily.  Dispense: 30 capsule; Refill: 0  10. Herpes simplex vulvovaginitis  - valACYclovir (VALTREX) 500 MG tablet; Take 1 tablet (500 mg total) by  mouth 3 (three) times daily.  Dispense: 90 tablet; Refill: 0  11. Needs flu shot  refused

## 2016-03-29 ENCOUNTER — Other Ambulatory Visit: Payer: Self-pay | Admitting: Family Medicine

## 2016-03-29 NOTE — Telephone Encounter (Signed)
Patient requesting refill of One Touch Test Strips.

## 2016-05-06 ENCOUNTER — Ambulatory Visit: Payer: BLUE CROSS/BLUE SHIELD | Admitting: Dietician

## 2016-05-11 ENCOUNTER — Ambulatory Visit (INDEPENDENT_AMBULATORY_CARE_PROVIDER_SITE_OTHER): Payer: 59 | Admitting: Family Medicine

## 2016-05-11 ENCOUNTER — Encounter: Payer: Self-pay | Admitting: Family Medicine

## 2016-05-11 ENCOUNTER — Ambulatory Visit: Payer: BLUE CROSS/BLUE SHIELD | Admitting: Family Medicine

## 2016-05-11 VITALS — BP 132/82 | HR 102 | Temp 98.6°F | Resp 16 | Ht 63.0 in | Wt 247.7 lb

## 2016-05-11 DIAGNOSIS — E1121 Type 2 diabetes mellitus with diabetic nephropathy: Secondary | ICD-10-CM | POA: Diagnosis not present

## 2016-05-11 DIAGNOSIS — E113299 Type 2 diabetes mellitus with mild nonproliferative diabetic retinopathy without macular edema, unspecified eye: Secondary | ICD-10-CM

## 2016-05-11 DIAGNOSIS — E1165 Type 2 diabetes mellitus with hyperglycemia: Secondary | ICD-10-CM

## 2016-05-11 DIAGNOSIS — R0683 Snoring: Secondary | ICD-10-CM

## 2016-05-11 DIAGNOSIS — IMO0002 Reserved for concepts with insufficient information to code with codable children: Secondary | ICD-10-CM

## 2016-05-11 DIAGNOSIS — R634 Abnormal weight loss: Secondary | ICD-10-CM

## 2016-05-11 DIAGNOSIS — K529 Noninfective gastroenteritis and colitis, unspecified: Secondary | ICD-10-CM

## 2016-05-11 MED ORDER — ONETOUCH ULTRA SYSTEM W/DEVICE KIT: 1.0000 | PACK | Freq: Once | 0 refills | Status: AC

## 2016-05-11 NOTE — Progress Notes (Addendum)
Name: Wendy [name]   MRN: [id number]    DOB: 07/14/1960   Date:05/11/2016       Progress Note  Subjective  Chief Complaint  Chief Complaint  Patient presents with  . Follow-up    6 week F/U DM  . Diabetes    States she did not like the Trucility, it made her feel crazy. Also the Xigduo could only tolerate 1 pill instead of the 2 as directed. Pt stated that she has not been checking blood sugars because she needs a new machine    HPI  DMII: she is here for a 6 weeks follow up, she states she has been using Trulicity once a week, she states she has been eating small snacks, unable to eat a large meal. She also states only able to take one pill of Xigduo or it makes her feel sick. Her glucose machine is no longer working and she has not been able to check glucose at home. She states that when she was taking two pills of Xigduo she was having symptoms of hypoglycemia  Gastroenteritis: she developed symptoms a few weeks ago, she had nausea, vomiting and diarrhea, no fever or blood in stools, symptoms resolved one week ago.   Snoring: she has HTN, DM and obesity, she feels tired during the day and snore at night. ESS of 17 today, explained importance of sleep study  Patient Active Problem List   Diagnosis Date Noted  . Uncontrolled type 2 diabetes mellitus with nonproliferative retinopathy ([facility]) 03/01/2016  . Vitamin D deficiency 07/07/2015  . Vitamin B12 deficiency 07/07/2015  . Snoring 12/25/2014  . Allergic rhinitis, seasonal 12/14/2014  . Chronic constipation 12/14/2014  . Diabetes mellitus with renal manifestation ([facility]) 12/14/2014  . Dyslipidemia 12/14/2014  . Dermatitis, eczematoid 12/14/2014  . Edema extremities 12/14/2014  . Essential (primary) hypertension 12/14/2014  . Gastro-esophageal reflux disease without esophagitis 12/14/2014  . Genital herpes in women 12/14/2014  . Extreme obesity (HCC) 12/14/2014  . Female climacteric state 12/14/2014    Past Surgical History:   Procedure Laterality Date  . CESAREAN SECTION  1993    Family History  Problem Relation Age of Onset  . Diabetes Mother   . Asthma Mother   . Heart disease Mother   . Cancer Father     prostate  . Heart disease Father   . Breast cancer Neg Hx     Social History   Social History  . Marital status: Married    Spouse name: N/A  . Number of children: N/A  . Years of education: N/A   Occupational History  . Not on file.   Social History Main Topics  . Smoking status: Never Smoker  . Smokeless tobacco: Never Used  . Alcohol use No  . Drug use: No  . Sexual activity: Yes   Other Topics Concern  . Not on file   Social History Narrative  . No narrative on file     Current Outpatient Prescriptions:  .  aspirin (ASPIRIN LOW DOSE) 81 MG chewable tablet, Chew 1 tablet by mouth daily., Disp: , Rfl:  .  atorvastatin (LIPITOR) 40 MG tablet, Take 1 tablet (40 mg total) by mouth every evening., Disp: 30 tablet, Rfl: 2 .  B-12, Methylcobalamin, 1000 MCG SUBL, Place 1 tablet under the tongue daily., Disp: 30 tablet, Rfl: 0 .  Cholecalciferol (VITAMIN D) 2000 units CAPS, Take 1 capsule (2,000 Units total) by mouth daily., Disp: 30 capsule, Rfl: 0 .  Dapagliflozin-Metformin HCl ER (  XIGDUO XR) 08-998 MG TB24, Take 2 tablets by mouth daily., Disp: 60 tablet, Rfl: 2 .  FARXIGA 10 MG TABS tablet, Take 10 mg by mouth daily., Disp: , Rfl: 2 .  ketoconazole (NIZORAL) 2 % cream, Apply 1 application topically 2 (two) times daily., Disp: 30 g, Rfl: 0 .  loratadine (CLARITIN) 10 MG tablet, TAKE 1 TABLET (10 MG TOTAL) BY MOUTH DAILY., Disp: 30 tablet, Rfl: 3 .  omeprazole (PRILOSEC) 40 MG capsule, Take 1 capsule (40 mg total) by mouth daily., Disp: 30 capsule, Rfl: 0 .  ONE TOUCH ULTRA TEST test strip, CHECK FASTING BLOOD SUGARS TWICE DAILY, Disp: 100 each, Rfl: 2 .  Polyethylene Glycol 3350 POWD, Take by mouth., Disp: , Rfl:  .  telmisartan-hydrochlorothiazide (MICARDIS HCT) 80-25 MG tablet,  Take 1 tablet by mouth daily., Disp: 90 tablet, Rfl: 0 .  valACYclovir (VALTREX) 500 MG tablet, Take 1 tablet (500 mg total) by mouth 3 (three) times daily., Disp: 90 tablet, Rfl: 0 .  venlafaxine XR (EFFEXOR-XR) 75 MG 24 hr capsule, Take 1 capsule (75 mg total) by mouth daily., Disp: 90 capsule, Rfl: 0 .  Dulaglutide (TRULICITY) 1.5 VQ/0.0QQ SOPN, Inject 1.5 mg into the skin once a week. (Patient not taking: Reported on 05/11/2016), Disp: 4 pen, Rfl: 2  Allergies  Allergen Reactions  . Penicillins Swelling     ROS  Ten systems reviewed and is negative except as mentioned in HPI   Objective  Vitals:   05/11/16 1148  BP: 132/82  Pulse: (!) 102  Resp: 16  Temp: 98.6 F (37 C)  TempSrc: Oral  SpO2: 97%  Weight: 247 lb 11.2 oz (112.4 kg)  Height: 5' 3" (1.6 m)    Body mass index is 43.88 kg/m.  Physical Exam  Constitutional: Patient appears well-developed and well-nourished. Obese No distress.  HEENT: head atraumatic, normocephalic, pupils equal and reactive to light,  neck supple, throat within normal limits Cardiovascular: Normal rate, regular rhythm and normal heart sounds.  No murmur heard. No BLE edema. Pulmonary/Chest: Effort normal and breath sounds normal. No respiratory distress. Abdominal: Soft.  There is no tenderness. Psychiatric: Patient has a normal mood and affect. behavior is normal. Judgment and thought content normal.  Recent Results (from the past 2160 hour(s))  HM DIABETES EYE EXAM     Status: Abnormal   Collection Time: 02/29/16 12:00 AM  Result Value Ref Range   HM Diabetic Eye Exam Retinopathy (A) No Retinopathy    Comment: Mild Nonproliferative-2smalldotblothernesOD.Dr.Kevin Ritter   POCT HgB A1C     Status: Abnormal   Collection Time: 03/25/16  3:24 PM  Result Value Ref Range   Hemoglobin A1C 9.1   POCT UA - Microalbumin     Status: Abnormal   Collection Time: 03/25/16  3:34 PM  Result Value Ref Range   Microalbumin Ur, POC 0.50 mg/L    Creatinine, POC  mg/dL   Albumin/Creatinine Ratio, Urine, POC        PHQ2/9: Depression screen [facility] 2/9 03/25/2016 12/09/2015 07/07/2015 03/27/2015 12/25/2014  Decreased Interest 0 0 0 0 0  Down, Depressed, Hopeless 0 0 0 1 1  PHQ - 2 Score 0 0 0 1 1     Fall Risk: Fall Risk  03/25/2016 12/09/2015 07/07/2015 03/27/2015 12/25/2014  Falls in the past year? _0       Assessment & Plan  1. Uncontrolled type 2 diabetes mellitus with nonproliferative retinopathy ([place])  Seems to be doing well, losing weight,  continue only one pill of Xigduo and trulicity once a week, check glucose at home and return in 6 weeks, reassurance given about side effects - Blood Glucose Monitoring Suppl (ONE TOUCH ULTRA SYSTEM KIT) w/Device KIT; 1 kit by Does not apply route once.  Dispense: 1 each; Refill: 0  2. Type 2 diabetes mellitus with diabetic nephropathy, without long-term current use of insulin (HCC)  - Blood Glucose Monitoring Suppl (ONE TOUCH ULTRA SYSTEM KIT) w/Device KIT; 1 kit by Does not apply route once.  Dispense: 1 each; Refill: 0  3. Weight loss  Likely from the medication   4. Gastroenteritis  Resolved now  5. Snoring  - Ambulatory referral to Sleep Studies

## 2016-05-11 NOTE — Addendum Note (Signed)
Addended by: Steele Sizer F on: 05/11/2016 12:45 PM   Modules accepted: Orders

## 2016-05-13 ENCOUNTER — Encounter: Payer: Self-pay | Admitting: Family Medicine

## 2016-05-26 ENCOUNTER — Telehealth: Payer: Self-pay | Admitting: Family Medicine

## 2016-05-26 NOTE — Telephone Encounter (Signed)
We already stopped the Metformin, the indigestion should not be related to medication. Trulicity can cause nausea and fullness if she over eats, the treatment for that is to eat smaller portions and avoid feeling full. His indigestion may be secondary to her reflux. Ask her to return for follow up if symptoms persists

## 2016-05-26 NOTE — Telephone Encounter (Signed)
Patient stated that her DM medications is causing her ingestion and gas but she is not sure which medication.  Please advise.

## 2016-05-27 NOTE — Telephone Encounter (Signed)
Spoke to pt

## 2016-05-30 ENCOUNTER — Other Ambulatory Visit: Payer: Self-pay

## 2016-05-30 MED ORDER — RANITIDINE HCL 150 MG PO TABS: 150.0000 mg | ORAL_TABLET | Freq: Two times a day (BID) | ORAL | 1 refills | Status: DC

## 2016-05-30 NOTE — Telephone Encounter (Signed)
Patient requesting refill of Ranitidine to [facility]. 

## 2016-05-31 ENCOUNTER — Other Ambulatory Visit: Payer: Self-pay

## 2016-06-09 ENCOUNTER — Encounter: Payer: Self-pay | Admitting: Family Medicine

## 2016-06-09 DIAGNOSIS — E113293 Type 2 diabetes mellitus with mild nonproliferative diabetic retinopathy without macular edema, bilateral: Secondary | ICD-10-CM | POA: Insufficient documentation

## 2016-06-22 ENCOUNTER — Ambulatory Visit (INDEPENDENT_AMBULATORY_CARE_PROVIDER_SITE_OTHER): Payer: 59 | Admitting: Family Medicine

## 2016-06-22 ENCOUNTER — Encounter: Payer: Self-pay | Admitting: Family Medicine

## 2016-06-22 ENCOUNTER — Encounter: Payer: Self-pay | Admitting: Gastroenterology

## 2016-06-22 VITALS — BP 134/80 | HR 87 | Temp 98.0°F | Resp 16 | Ht 63.0 in | Wt 255.2 lb

## 2016-06-22 DIAGNOSIS — A6004 Herpesviral vulvovaginitis: Secondary | ICD-10-CM | POA: Diagnosis not present

## 2016-06-22 DIAGNOSIS — E785 Hyperlipidemia, unspecified: Secondary | ICD-10-CM

## 2016-06-22 DIAGNOSIS — E668 Other obesity: Secondary | ICD-10-CM

## 2016-06-22 DIAGNOSIS — N950 Postmenopausal bleeding: Secondary | ICD-10-CM | POA: Diagnosis not present

## 2016-06-22 DIAGNOSIS — E113299 Type 2 diabetes mellitus with mild nonproliferative diabetic retinopathy without macular edema, unspecified eye: Secondary | ICD-10-CM | POA: Diagnosis not present

## 2016-06-22 DIAGNOSIS — Z1211 Encounter for screening for malignant neoplasm of colon: Secondary | ICD-10-CM

## 2016-06-22 DIAGNOSIS — IMO0002 Reserved for concepts with insufficient information to code with codable children: Secondary | ICD-10-CM

## 2016-06-22 DIAGNOSIS — N951 Menopausal and female climacteric states: Secondary | ICD-10-CM | POA: Diagnosis not present

## 2016-06-22 DIAGNOSIS — E538 Deficiency of other specified B group vitamins: Secondary | ICD-10-CM

## 2016-06-22 DIAGNOSIS — E1165 Type 2 diabetes mellitus with hyperglycemia: Secondary | ICD-10-CM

## 2016-06-22 DIAGNOSIS — I10 Essential (primary) hypertension: Secondary | ICD-10-CM

## 2016-06-22 DIAGNOSIS — E1121 Type 2 diabetes mellitus with diabetic nephropathy: Secondary | ICD-10-CM

## 2016-06-22 DIAGNOSIS — R1084 Generalized abdominal pain: Secondary | ICD-10-CM | POA: Diagnosis not present

## 2016-06-22 LAB — POCT GLYCOSYLATED HEMOGLOBIN (HGB A1C): HEMOGLOBIN A1C: 7.1

## 2016-06-22 MED ORDER — TELMISARTAN-HCTZ 80-25 MG PO TABS: 1.0000 | ORAL_TABLET | Freq: Every day | ORAL | 1 refills | Status: DC

## 2016-06-22 MED ORDER — VALACYCLOVIR HCL 500 MG PO TABS: 500.0000 mg | ORAL_TABLET | Freq: Three times a day (TID) | ORAL | 1 refills | Status: DC

## 2016-06-22 MED ORDER — DAPAGLIFLOZIN PRO-METFORMIN ER 5-1000 MG PO TB24: 2.0000 | ORAL_TABLET | Freq: Every day | ORAL | 2 refills | Status: DC

## 2016-06-22 MED ORDER — VENLAFAXINE HCL ER 75 MG PO CP24: 75.0000 mg | ORAL_CAPSULE | Freq: Every day | ORAL | 1 refills | Status: DC

## 2016-06-22 MED ORDER — ATORVASTATIN CALCIUM 40 MG PO TABS: 40.0000 mg | ORAL_TABLET | Freq: Every evening | ORAL | 1 refills | Status: DC

## 2016-06-22 MED ORDER — DULAGLUTIDE 1.5 MG/0.5ML ~~LOC~~ SOAJ: 1.5000 mg | SUBCUTANEOUS | 2 refills | Status: DC

## 2016-06-22 NOTE — Progress Notes (Signed)
Name: Wendy [name]   MRN: [id number]    DOB: [birth year]   Date:06/22/2016       Progress Note  Subjective  Chief Complaint  Chief Complaint  Patient presents with  . Diabetes    6 week follow up pt still having indigestion, and itching at night    HPI   DM with nephropathy, retinopathy: she stopped taking Trulicity because her  ASNK5L was 8.1% and went up to  9.1%, today it is 7.1%. She is up to date with eye exam, on ARB. She seems to be very confused about her medications. She refuses to see dietician, she states she is avoid sodas and is eating better. FSBS at home has been around 120's fasting but she did not bring her log  HTN: taking medications as indicated, bp is at goal. No chest pain, palpitation  or SOB  Post-menopausal bleeding: she is post-menopausal, but she has noticed light pink with symptoms of a cycle such as bloating, weight gain . She states it happens monthly, explained need for further evaluation by gyn  Hyperlipidemia: she states she is back on Atorvastatin, no chest pain.   Snoring: she did not go to sleep study, and she did not re-schedule it. She always feels tired. Explained importance of sleep study. There is an increase risk of heart attacks and strokes in patient with sleep apnea that are not treated.   B12 and vitamin D deficiency: not currently taking supplementation, explained importance of resuming it  Abdominal pain: she is not taking Omeprazole or Ranitidine, she states not at the pharmacy. She has diffuse abdominal pain, bloating and cramping like all over her abdomen. No nausea or vomiting. No heart burn.   Extremity obesity: weight has gone up she stopped skipping meals, eating more salads.    Patient Active Problem List   Diagnosis Date Noted  . Mild nonproliferative diabetic retinopathy of both eyes associated with type 2 diabetes mellitus ([place]) 06/09/2016  . Uncontrolled type 2 diabetes mellitus with nonproliferative retinopathy ([place])  03/01/2016  . Vitamin D deficiency 07/07/2015  . Vitamin B12 deficiency 07/07/2015  . Snoring 12/25/2014  . Allergic rhinitis, seasonal 12/14/2014  . Chronic constipation 12/14/2014  . Diabetes mellitus with renal manifestation ([place]) 12/14/2014  . Dyslipidemia 12/14/2014  . Dermatitis, eczematoid 12/14/2014  . Edema extremities 12/14/2014  . Essential (primary) hypertension 12/14/2014  . Gastro-esophageal reflux disease without esophagitis 12/14/2014  . Genital herpes in women 12/14/2014  . Extreme obesity ([place]) 12/14/2014  . Female climacteric state 12/14/2014    Past Surgical History:  Procedure Laterality Date  . CESAREAN SECTION  1993    Family History  Problem Relation Age of Onset  . Diabetes Mother   . Asthma Mother   . Heart disease Mother   . Cancer Father     prostate  . Heart disease Father   . Breast cancer Neg Hx     Social History   Social History  . Marital status: Married    Spouse name: N/A  . Number of children: N/A  . Years of education: N/A   Occupational History  . Not on file.   Social History Main Topics  . Smoking status: Never Smoker  . Smokeless tobacco: Never Used  . Alcohol use No  . Drug use: No  . Sexual activity: Yes   Other Topics Concern  . Not on file   Social History Narrative  . No narrative on file     Current Outpatient Prescriptions:  .  aspirin (ASPIRIN LOW DOSE) 81 MG chewable tablet, Chew 1 tablet by mouth daily., Disp: , Rfl:  .  atorvastatin (LIPITOR) 40 MG tablet, Take 1 tablet (40 mg total) by mouth every evening., Disp: 30 tablet, Rfl: 2 .  B-12, Methylcobalamin, 1000 MCG SUBL, Place 1 tablet under the tongue daily., Disp: 30 tablet, Rfl: 0 .  Cholecalciferol (VITAMIN D) 2000 units CAPS, Take 1 capsule (2,000 Units total) by mouth daily., Disp: 30 capsule, Rfl: 0 .  Dapagliflozin-Metformin HCl ER (XIGDUO XR) 08-998 MG TB24, Take 2 tablets by mouth daily., Disp: 60 tablet, Rfl: 2 .  Dulaglutide (TRULICITY)  1.5 XT/0.5WP SOPN, Inject 1.5 mg into the skin once a week. (Patient not taking: Reported on 05/11/2016), Disp: 4 pen, Rfl: 2 .  ketoconazole (NIZORAL) 2 % cream, Apply 1 application topically 2 (two) times daily., Disp: 30 g, Rfl: 0 .  loratadine (CLARITIN) 10 MG tablet, TAKE 1 TABLET (10 MG TOTAL) BY MOUTH DAILY., Disp: 30 tablet, Rfl: 3 .  omeprazole (PRILOSEC) 40 MG capsule, Take 1 capsule (40 mg total) by mouth daily., Disp: 30 capsule, Rfl: 0 .  ONE TOUCH ULTRA TEST test strip, CHECK FASTING BLOOD SUGARS TWICE DAILY, Disp: 100 each, Rfl: 2 .  Polyethylene Glycol 3350 POWD, Take by mouth., Disp: , Rfl:  .  ranitidine (ZANTAC) 150 MG tablet, Take 1 tablet (150 mg total) by mouth 2 (two) times daily., Disp: 90 tablet, Rfl: 1 .  telmisartan-hydrochlorothiazide (MICARDIS HCT) 80-25 MG tablet, Take 1 tablet by mouth daily., Disp: 90 tablet, Rfl: 0 .  valACYclovir (VALTREX) 500 MG tablet, Take 1 tablet (500 mg total) by mouth 3 (three) times daily., Disp: 90 tablet, Rfl: 0 .  venlafaxine XR (EFFEXOR-XR) 75 MG 24 hr capsule, Take 1 capsule (75 mg total) by mouth daily., Disp: 90 capsule, Rfl: 0  Allergies  Allergen Reactions  . Penicillins Swelling     ROS  Constitutional: Negative for fever , positive for weight change.  Respiratory: Negative for cough and shortness of breath.   Cardiovascular: Negative for chest pain or palpitations.  Gastrointestinal: Positive for abdominal pain, no bowel changes, but she has more gas/bloated.  Musculoskeletal: Negative for gait problem or joint swelling.  Skin: Negative for rash.  Neurological: Negative for dizziness or headache.  No other specific complaints in a complete review of systems (except as listed in HPI above).  Objective  Vitals:   06/22/16 1032  BP: 134/80  Pulse: 87  Resp: 16  Temp: 98 F (36.7 C)  Weight: 255 lb 3 oz (115.8 kg)  Height: 5\' 3"  (1.6 m)    Body mass index is 45.2 kg/m.  Physical Exam  Constitutional:  Patient appears well-developed and well-nourished. Obese No distress.  HEENT: head atraumatic, normocephalic, pupils equal and reactive to light,  neck supple, throat within normal limits Cardiovascular: Normal rate, regular rhythm and normal heart sounds.  No murmur heard. No BLE edema. Pulmonary/Chest: Effort normal and breath sounds normal. No respiratory distress. Abdominal: Soft.  There is no tenderness. Psychiatric: Patient has a normal mood and affect. behavior is normal. Judgment and thought content normal.  Recent Results (from the past 2160 hour(s))  POCT HgB A1C     Status: Abnormal   Collection Time: 03/25/16  3:24 PM  Result Value Ref Range   Hemoglobin A1C 9.1   POCT UA - Microalbumin     Status: Abnormal   Collection Time: 03/25/16  3:34 PM  Result Value Ref Range   Microalbumin  Ur, POC 0.50 mg/L   Creatinine, POC  mg/dL   Albumin/Creatinine Ratio, Urine, POC        PHQ2/9: Depression screen [facility] 2/9 03/25/2016 12/09/2015 07/07/2015 03/27/2015 12/25/2014  Decreased Interest 0 0 0 0 0  Down, Depressed, Hopeless 0 0 0 1 1  PHQ - 2 Score 0 0 0 1 1     Fall Risk: Fall Risk  03/25/2016 12/09/2015 07/07/2015 03/27/2015 12/25/2014  Falls in the past year? No No No No No     Assessment & Plan

## 2016-06-25 ENCOUNTER — Other Ambulatory Visit: Payer: Self-pay | Admitting: Family Medicine

## 2016-06-25 DIAGNOSIS — N951 Menopausal and female climacteric states: Secondary | ICD-10-CM

## 2016-07-03 ENCOUNTER — Other Ambulatory Visit: Payer: Self-pay | Admitting: Family Medicine

## 2016-07-03 DIAGNOSIS — E113299 Type 2 diabetes mellitus with mild nonproliferative diabetic retinopathy without macular edema, unspecified eye: Secondary | ICD-10-CM

## 2016-07-03 DIAGNOSIS — E1121 Type 2 diabetes mellitus with diabetic nephropathy: Secondary | ICD-10-CM

## 2016-07-03 DIAGNOSIS — IMO0002 Reserved for concepts with insufficient information to code with codable children: Secondary | ICD-10-CM

## 2016-07-03 DIAGNOSIS — I10 Essential (primary) hypertension: Secondary | ICD-10-CM

## 2016-07-03 DIAGNOSIS — E1165 Type 2 diabetes mellitus with hyperglycemia: Secondary | ICD-10-CM

## 2016-07-04 ENCOUNTER — Ambulatory Visit (INDEPENDENT_AMBULATORY_CARE_PROVIDER_SITE_OTHER): Payer: 59 | Admitting: Gastroenterology

## 2016-07-04 ENCOUNTER — Encounter: Payer: Self-pay | Admitting: Gastroenterology

## 2016-07-04 VITALS — BP 138/80 | HR 88 | Ht 63.5 in | Wt 259.1 lb

## 2016-07-04 DIAGNOSIS — R140 Abdominal distension (gaseous): Secondary | ICD-10-CM | POA: Diagnosis not present

## 2016-07-04 DIAGNOSIS — Z1211 Encounter for screening for malignant neoplasm of colon: Secondary | ICD-10-CM | POA: Diagnosis not present

## 2016-07-04 DIAGNOSIS — K219 Gastro-esophageal reflux disease without esophagitis: Secondary | ICD-10-CM | POA: Diagnosis not present

## 2016-07-04 DIAGNOSIS — K5900 Constipation, unspecified: Secondary | ICD-10-CM

## 2016-07-04 DIAGNOSIS — Z1212 Encounter for screening for malignant neoplasm of rectum: Secondary | ICD-10-CM | POA: Diagnosis not present

## 2016-07-04 DIAGNOSIS — R1030 Lower abdominal pain, unspecified: Secondary | ICD-10-CM

## 2016-07-04 MED ORDER — LINACLOTIDE 145 MCG PO CAPS
145.0000 ug | ORAL_CAPSULE | Freq: Every day | ORAL | 11 refills | Status: DC
Start: 2016-07-04 — End: 2017-11-06

## 2016-07-04 MED ORDER — NA SULFATE-K SULFATE-MG SULF 17.5-3.13-1.6 GM/177ML PO SOLN: 1.0000 | Freq: Once | ORAL | 0 refills | Status: AC

## 2016-07-04 NOTE — Patient Instructions (Addendum)
If you are age 56 or older, your body mass index should be between 23-30. Your Body mass index is 45.18 kg/m. If this is out of the aforementioned range listed, please consider follow up with your Primary Care Provider.  If you are age 5 or younger, your body mass index should be between 19-25. Your Body mass index is 45.18 kg/m. If this is out of the aformentioned range listed, please consider follow up with your Primary Care Provider.   You have been scheduled for a colonoscopy. Please follow written instructions given to you at your visit today.  Please pick up your prep supplies at the pharmacy within the next 1-3 days. If you use inhalers (even only as needed), please bring them with you on the day of your procedure. Your physician has requested that you go to www.startemmi.com and enter the access code given to you at your visit today. This web site gives a general overview about your procedure. However, you should still follow specific instructions given to you by our office regarding your preparation for the procedure.  We have sent the following medications to your pharmacy for you to pick up at your convenience: Linzess Suprep  Take Magnesium Citrate (over the counter) once or twice over the next couple of days to get things started.  Thank you for choosing me and [facility].  Pricilla Riffle. Dagoberto Ligas., MD., Marval Regal

## 2016-07-04 NOTE — Progress Notes (Signed)
History of Present Illness: This is a 56 year old female referred by Steele Sizer, MD for the evaluation of constipation, abdominal bloating and lower abdominal pain. Patient relates several years constipation and generalized abdominal bloating. She states she has a bowel movement about every 4-5 days. MiraLAX has been tried without success. She has not tried any other laxatives. She relates frequent postprandial lower abdominal pain for the past 2-3 years that lasts for about 10-15 minutes. She has not previously undergone colonoscopy. Denies weight loss, diarrhea, change in stool caliber, melena, hematochezia, nausea, vomiting, dysphagia, chest pain.    Allergies  Allergen Reactions  . Penicillins Swelling   Outpatient Medications Prior to Visit  Medication Sig Dispense Refill  . aspirin (ASPIRIN LOW DOSE) 81 MG chewable tablet Chew 1 tablet by mouth daily.    Marland Kitchen atorvastatin (LIPITOR) 40 MG tablet Take 1 tablet (40 mg total) by mouth every evening. 90 tablet 1  . B-12, Methylcobalamin, 1000 MCG SUBL Place 1 tablet under the tongue daily. 30 tablet 0  . Cholecalciferol (VITAMIN D) 2000 units CAPS Take 1 capsule (2,000 Units total) by mouth daily. 30 capsule 0  . Dulaglutide (TRULICITY) 1.5 GG/8.3MO SOPN Inject 1.5 mg into the skin once a week. 4 pen 2  . ketoconazole (NIZORAL) 2 % cream Apply 1 application topically 2 (two) times daily. 30 g 0  . loratadine (CLARITIN) 10 MG tablet TAKE 1 TABLET (10 MG TOTAL) BY MOUTH DAILY. 30 tablet 3  . omeprazole (PRILOSEC) 40 MG capsule Take 1 capsule (40 mg total) by mouth daily. 30 capsule 0  . ONE TOUCH ULTRA TEST test strip CHECK FASTING BLOOD SUGARS TWICE DAILY 100 each 2  . Polyethylene Glycol 3350 POWD Take by mouth.    . ranitidine (ZANTAC) 150 MG tablet Take 1 tablet (150 mg total) by mouth 2 (two) times daily. 90 tablet 1  . telmisartan-hydrochlorothiazide (MICARDIS HCT) 80-25 MG tablet TAKE 1 TABLET BY MOUTH DAILY. *INS ONLY PAYS FOR 30  DAYS* 90 tablet 1  . valACYclovir (VALTREX) 500 MG tablet Take 1 tablet (500 mg total) by mouth 3 (three) times daily. 90 tablet 1  . venlafaxine XR (EFFEXOR-XR) 75 MG 24 hr capsule TAKE 1 CAPSULE (75 MG TOTAL) BY MOUTH DAILY. 90 capsule 1  . XIGDUO XR 08-998 MG TB24 TAKE 2 TABLETS BY MOUTH DAILY. 60 tablet 1   No facility-administered medications prior to visit.    Past Medical History:  Diagnosis Date  . Acute eczema   . Allergy   . Chronic constipation with overflow   . Diabetes mellitus without complication ([place])   . GERD (gastroesophageal reflux disease)   . Herpetic vesicle in vagina   . Hyperlipidemia   . Hypertension   . Leg edema   . Morbid obesity ([place])   . Peri-menopause   . Snoring    Past Surgical History:  Procedure Laterality Date  . [place]   Social History   Social History  . Marital status: Married    Spouse name: N/A  . Number of children: 1  . Years of education: N/A   Social History Main Topics  . Smoking status: Former Smoker    Quit date: 04/19/1991  . Smokeless tobacco: Never Used  . Alcohol use No  . Drug use: No  . Sexual activity: Yes   Other Topics Concern  . None   Social History Narrative  . None   Family History  Problem Relation Age of  Onset  . Diabetes Mother   . Asthma Mother   . Heart disease Mother   . Heart disease Father   . Prostate cancer Father   . Other Father     blockages in stomach  . Kidney disease Paternal Grandmother   . Breast cancer Neg Hx       Review of Systems: Pertinent positive and negative review of systems were noted in the above HPI section. All other review of systems were otherwise negative.    Physical Exam: General: Well developed, well nourished, obese, no acute distress Head: Normocephalic and atraumatic Eyes:  sclerae anicteric, EOMI Ears: Normal auditory acuity Mouth: No deformity or lesions Neck: Supple, no masses or thyromegaly Lungs: Clear throughout to  auscultation Heart: Regular rate and rhythm; no murmurs, rubs or bruits Abdomen: Soft, non tender and non distended. No masses, hepatosplenomegaly or hernias noted. Normal Bowel sounds Rectal: deferred to colonoscopy Musculoskeletal: Symmetrical with no gross deformities  Skin: No lesions on visible extremities Pulses:  Normal pulses noted Extremities: No clubbing, cyanosis, edema or deformities noted Neurological: Alert oriented x 4, grossly nonfocal Cervical Nodes:  No significant cervical adenopathy Inguinal Nodes: No significant inguinal adenopathy Psychological:  Alert and cooperative. Normal mood and affect  Assessment and Recommendations:  1. Constipation, abdominal bloating and lower abdominal pain. Symptoms likely from constipation, possible IBS. Trial of Linzess 145 mcg qd. If not effective trial of Trulance. Increase daily water intake and fiber intake.   2. CRC screening, average risk. Schedule colonoscopy. The risks (including bleeding, perforation, infection, missed lesions, medication reactions and possible hospitalization or surgery if complications occur), benefits, and alternatives to colonoscopy with possible biopsy and possible polypectomy were discussed with the patient and they consent to proceed.   3. GERD. Controlled with omeprazole 40 mg po daily and ranitidine 150 mg daily. Follow all standard antireflux measures Advised to continue omeprazole daily and use ranitidine as needed. If this regimen is not adequate she is advised to contact us for further management.   cc: Steele Sizer, MD [address] [place], [place] [address]

## 2016-07-08 ENCOUNTER — Encounter: Payer: BLUE CROSS/BLUE SHIELD | Admitting: Family Medicine

## 2016-07-08 ENCOUNTER — Encounter: Payer: 59 | Admitting: Gastroenterology

## 2016-08-02 ENCOUNTER — Encounter: Payer: Self-pay | Admitting: Gastroenterology

## 2016-08-16 ENCOUNTER — Encounter: Payer: Self-pay | Admitting: Gastroenterology

## 2016-08-16 ENCOUNTER — Ambulatory Visit (AMBULATORY_SURGERY_CENTER): Payer: 59 | Admitting: Gastroenterology

## 2016-08-16 VITALS — BP 132/78 | HR 78 | Temp 98.4°F | Resp 11 | Ht 63.5 in | Wt 259.0 lb

## 2016-08-16 DIAGNOSIS — K635 Polyp of colon: Secondary | ICD-10-CM | POA: Diagnosis not present

## 2016-08-16 DIAGNOSIS — Z1211 Encounter for screening for malignant neoplasm of colon: Secondary | ICD-10-CM | POA: Diagnosis present

## 2016-08-16 DIAGNOSIS — Z1212 Encounter for screening for malignant neoplasm of rectum: Secondary | ICD-10-CM

## 2016-08-16 DIAGNOSIS — K6389 Other specified diseases of intestine: Secondary | ICD-10-CM | POA: Diagnosis not present

## 2016-08-16 DIAGNOSIS — E119 Type 2 diabetes mellitus without complications: Secondary | ICD-10-CM | POA: Diagnosis not present

## 2016-08-16 DIAGNOSIS — D125 Benign neoplasm of sigmoid colon: Secondary | ICD-10-CM

## 2016-08-16 MED ORDER — SODIUM CHLORIDE 0.9 % IV SOLN: 500.0000 mL | INTRAVENOUS | Status: DC

## 2016-08-16 NOTE — Op Note (Signed)
[facility] Patient Name: Wendy [name] Procedure Date: 08/16/2016 3:39 PM MRN: [id number] Endoscopist: Ladene Artist , MD Age: 56 Referring MD:  Date of Birth: 04-13-1961 Gender: Female Account #: [id number] Procedure:                Colonoscopy Indications:              Screening for colorectal malignant neoplasm, This                            is the patient's first colonoscopy Medicines:                Monitored Anesthesia Care Procedure:                Pre-Anesthesia Assessment:                           - Prior to the procedure, a History and Physical                            was performed, and patient medications and                            allergies were reviewed. The patient's tolerance of                            previous anesthesia was also reviewed. The risks                            and benefits of the procedure and the sedation                            options and risks were discussed with the patient.                            All questions were answered, and informed consent                            was obtained. Prior Anticoagulants: The patient has                            taken no previous anticoagulant or antiplatelet                            agents. ASA Grade Assessment: II - A patient with                            mild systemic disease. After reviewing the risks                            and benefits, the patient was deemed in                            satisfactory condition to undergo the procedure.  After obtaining informed consent, the colonoscope                            was passed under direct vision. Throughout the                            procedure, the patient's blood pressure, pulse, and                            oxygen saturations were monitored continuously. The                            Colonoscope was introduced through the anus and                            advanced to the the cecum,  identified by                            appendiceal orifice and ileocecal valve. The                            ileocecal valve, appendiceal orifice, and rectum                            were photographed. The quality of the bowel                            preparation was excellent. The colonoscopy was                            performed without difficulty. The patient tolerated                            the procedure well. Scope In: 3:43:20 PM Scope Out: 3:59:36 PM Scope Withdrawal Time: 0 hours 12 minutes 37 seconds  Total Procedure Duration: 0 hours 16 minutes 16 seconds  Findings:                 The perianal and digital rectal examinations were                            normal.                           A 6 mm polyp was found in the sigmoid colon. The                            polyp was sessile. The polyp was removed with a                            cold snare. Resection and retrieval were complete.                           The exam was otherwise without abnormality on  direct and retroflexion views. Complications:            No immediate complications. Estimated blood loss:                            None. Estimated Blood Loss:     Estimated blood loss: none. Impression:               - One 6 mm polyp in the sigmoid colon, removed with                            a cold snare. Resected and retrieved.                           - The examination was otherwise normal on direct                            and retroflexion views. Recommendation:           - Repeat colonoscopy in 5 years for surveillance if                            polyps is precancerous, otherwise 10 years for                            screening.                           - Patient has a contact number available for                            emergencies. The signs and symptoms of potential                            delayed complications were discussed with the                             patient. Return to normal activities tomorrow.                            Written discharge instructions were provided to the                            patient.                           - Resume previous diet.                           - Continue present medications.                           - Await pathology results. Ladene Artist, MD 08/16/2016 4:01:46 PM This report has been signed electronically.

## 2016-08-16 NOTE — Progress Notes (Signed)
#  6 Right lateral wrist unsuccessfully by Merrily Pew Monday, CRNA, #7 Left inner ac unsuccessfully by Thurston Pounds, RN, #8 Left outer wrist unsuccessfully by [facility] Monday, CRNA, #9 Right hand unsuccessfully by Lenard Galloway, RN, #10 Right forearm #22 successfully by Lenard Galloway, RN

## 2016-08-16 NOTE — Progress Notes (Signed)
Report to PACU, RN, vss, BBS= Clear.  

## 2016-08-16 NOTE — Patient Instructions (Signed)
YOU HAD AN ENDOSCOPIC PROCEDURE TODAY AT THE [facility]:   Refer to the procedure report that was given to you for any specific questions about what was found during the examination.  If the procedure report does not answer your questions, please call your gastroenterologist to clarify.  If you requested that your care partner not be given the details of your procedure findings, then the procedure report has been included in a sealed envelope for you to review at your convenience later.  YOU SHOULD EXPECT: Some feelings of bloating in the abdomen. Passage of more gas than usual.  Walking can help get rid of the air that was put into your GI tract during the procedure and reduce the bloating. If you had a lower endoscopy (such as a colonoscopy or flexible sigmoidoscopy) you may notice spotting of blood in your stool or on the toilet paper. If you underwent a bowel prep for your procedure, you may not have a normal bowel movement for a few days.  Please Note:  You might notice some irritation and congestion in your nose or some drainage.  This is from the oxygen used during your procedure.  There is no need for concern and it should clear up in a day or so.  SYMPTOMS TO REPORT IMMEDIATELY:   Following lower endoscopy (colonoscopy or flexible sigmoidoscopy):  Excessive amounts of blood in the stool  Significant tenderness or worsening of abdominal pains  Swelling of the abdomen that is new, acute  Fever of 100F or higher    For urgent or emergent issues, a gastroenterologist can be reached at any hour by calling [phone].   DIET:  We do recommend a small meal at first, but then you may proceed to your regular diet.  Drink plenty of fluids but you should avoid alcoholic beverages for 24 hours.  ACTIVITY:  You should plan to take it easy for the rest of today and you should NOT DRIVE or use heavy machinery until tomorrow (because of the sedation medicines used during the test).     FOLLOW UP: Our staff will call the number listed on your records the next business day following your procedure to check on you and address any questions or concerns that you may have regarding the information given to you following your procedure. If we do not reach you, we will leave a message.  However, if you are feeling well and you are not experiencing any problems, there is no need to return our call.  We will assume that you have returned to your regular daily activities without incident.  If any biopsies were taken you will be contacted by phone or by letter within the next 1-3 weeks.  Please call us at [phone] if you have not heard about the biopsies in 3 weeks.    SIGNATURES/CONFIDENTIALITY: You and/or your care partner have signed paperwork which will be entered into your electronic medical record.  These signatures attest to the fact that that the information above on your After Visit Summary has been reviewed and is understood.  Full responsibility of the confidentiality of this discharge information lies with you and/or your care-partner.   Resume medications. Information given on polyps. 

## 2016-08-16 NOTE — Progress Notes (Signed)
Called to room to assist during endoscopic procedure.  Patient ID and intended procedure confirmed with present staff. Received instructions for my participation in the procedure from the performing physician.  

## 2016-08-17 ENCOUNTER — Telehealth: Payer: Self-pay | Admitting: *Deleted

## 2016-08-17 NOTE — Telephone Encounter (Signed)
  Follow up Call-  Call back number 08/16/2016  Post procedure Call Back phone  # [phone] cell  Permission to leave phone message Yes  Some recent data might be hidden     Patient questions:  Do you have a fever, pain , or abdominal swelling? No. Pain Score  0 *  Have you tolerated food without any problems? Yes.    Have you been able to return to your normal activities? Yes.    Do you have any questions about your discharge instructions: Diet   No. Medications  No. Follow up visit  No.  Do you have questions or concerns about your Care? No.  Actions: * If pain score is 4 or above: No action needed, pain <4.

## 2016-08-25 ENCOUNTER — Ambulatory Visit (INDEPENDENT_AMBULATORY_CARE_PROVIDER_SITE_OTHER): Payer: 59 | Admitting: Family Medicine

## 2016-08-25 ENCOUNTER — Encounter: Payer: Self-pay | Admitting: Family Medicine

## 2016-08-25 VITALS — BP 130/76 | HR 94 | Temp 98.0°F | Resp 16 | Ht 64.0 in | Wt 256.0 lb

## 2016-08-25 DIAGNOSIS — IMO0002 Reserved for concepts with insufficient information to code with codable children: Secondary | ICD-10-CM

## 2016-08-25 DIAGNOSIS — Z01419 Encounter for gynecological examination (general) (routine) without abnormal findings: Secondary | ICD-10-CM | POA: Diagnosis not present

## 2016-08-25 DIAGNOSIS — E113299 Type 2 diabetes mellitus with mild nonproliferative diabetic retinopathy without macular edema, unspecified eye: Secondary | ICD-10-CM | POA: Diagnosis not present

## 2016-08-25 DIAGNOSIS — Z1231 Encounter for screening mammogram for malignant neoplasm of breast: Secondary | ICD-10-CM

## 2016-08-25 DIAGNOSIS — E1165 Type 2 diabetes mellitus with hyperglycemia: Secondary | ICD-10-CM

## 2016-08-25 DIAGNOSIS — N950 Postmenopausal bleeding: Secondary | ICD-10-CM

## 2016-08-25 DIAGNOSIS — Z124 Encounter for screening for malignant neoplasm of cervix: Secondary | ICD-10-CM

## 2016-08-25 DIAGNOSIS — Z1239 Encounter for other screening for malignant neoplasm of breast: Secondary | ICD-10-CM

## 2016-08-25 DIAGNOSIS — E538 Deficiency of other specified B group vitamins: Secondary | ICD-10-CM

## 2016-08-25 DIAGNOSIS — E559 Vitamin D deficiency, unspecified: Secondary | ICD-10-CM

## 2016-08-25 LAB — CBC WITH DIFFERENTIAL/PLATELET
Basophils Absolute: 0 cells/uL (ref 0–200)
Basophils Relative: 0 %
EOS PCT: 2 %
Eosinophils Absolute: 190 cells/uL (ref 15–500)
HEMATOCRIT: 39.6 % (ref 35.0–45.0)
HEMOGLOBIN: 12.7 g/dL (ref 11.7–15.5)
LYMPHS ABS: 2660 {cells}/uL (ref 850–3900)
Lymphocytes Relative: 28 %
MCH: 27 pg (ref 27.0–33.0)
MCHC: 32.1 g/dL (ref 32.0–36.0)
MCV: 84.3 fL (ref 80.0–100.0)
MONO ABS: 475 {cells}/uL (ref 200–950)
MPV: 10.8 fL (ref 7.5–12.5)
Monocytes Relative: 5 %
NEUTROS ABS: 6175 {cells}/uL (ref 1500–7800)
NEUTROS PCT: 65 %
Platelets: 302 10*3/uL (ref 140–400)
RBC: 4.7 MIL/uL (ref 3.80–5.10)
RDW: 15.4 % — ABNORMAL HIGH (ref 11.0–15.0)
WBC: 9.5 10*3/uL (ref 3.8–10.8)

## 2016-08-25 LAB — TSH: TSH: 3.05 m[IU]/L

## 2016-08-25 LAB — VITAMIN B12: VITAMIN B 12: 463 pg/mL (ref 200–1100)

## 2016-08-25 MED ORDER — DAPAGLIFLOZIN PRO-METFORMIN ER 5-1000 MG PO TB24: 2.0000 | ORAL_TABLET | Freq: Every day | ORAL | 1 refills | Status: DC

## 2016-08-25 NOTE — Progress Notes (Signed)
Name: Wendy [name]   MRN: [id number]    DOB: 03/05/1961   Date:08/25/2016       Progress Note  Subjective  Chief Complaint  Chief Complaint  Patient presents with  . Annual Exam    HPI  Well woman: sexually active, same partner / husband for 20 years, she has pain during intercourse ( feels dry ) , no bleeding after intercourse. Last mammogram normal in 2017, colonoscopy done last week.   Post-menopausal bleeding: she had stopped having cycles for about 3 years, but over the past few months she has light bleeding intermittently , it lasts for about one week and stops again. Associated with cramping, weight gain and legs feels heavy.    Patient Active Problem List   Diagnosis Date Noted  . Mild nonproliferative diabetic retinopathy of both eyes associated with type 2 diabetes mellitus ([place]) 06/09/2016  . Uncontrolled type 2 diabetes mellitus with nonproliferative retinopathy ([place]) 03/01/2016  . Vitamin D deficiency 07/07/2015  . Vitamin B12 deficiency 07/07/2015  . Snoring 12/25/2014  . Allergic rhinitis, seasonal 12/14/2014  . Chronic constipation 12/14/2014  . Diabetes mellitus with renal manifestation ([place]) 12/14/2014  . Dyslipidemia 12/14/2014  . Dermatitis, eczematoid 12/14/2014  . Edema extremities 12/14/2014  . Essential (primary) hypertension 12/14/2014  . Gastro-esophageal reflux disease without esophagitis 12/14/2014  . Genital herpes in women 12/14/2014  . Extreme obesity 12/14/2014  . Female climacteric state 12/14/2014    Past Surgical History:  Procedure Laterality Date  . CESAREAN SECTION  1993  . tonsillectomy and adnoidectomy    . WISDOM TOOTH EXTRACTION      Family History  Problem Relation Age of Onset  . Diabetes Mother   . Asthma Mother   . Heart disease Mother   . Heart disease Father   . Prostate cancer Father   . Other Father        blockages in stomach  . Kidney disease Paternal Grandmother   . Asthma Son   . Autism Son   . Breast cancer  Neg Hx   . Colon cancer Neg Hx   . Esophageal cancer Neg Hx   . Pancreatic cancer Neg Hx   . Rectal cancer Neg Hx   . Stomach cancer Neg Hx     Social History   Social History  . Marital status: Married    Spouse name: N/A  . Number of children: 1  . Years of education: N/A   Occupational History  . Not on file.   Social History Main Topics  . Smoking status: Former Smoker    Years: 20.00    Types: Cigarettes    Start date: 04/19/1971    Quit date: 04/19/1991  . Smokeless tobacco: Never Used  . Alcohol use No  . Drug use: No  . Sexual activity: Yes    Partners: Male    Birth control/ protection: Post-menopausal   Other Topics Concern  . Not on file   Social History Narrative   She lost her job Dec 2017 , worked for 36 years at the same company .    She is trying to find another job.   Married and has an adult son that has autism and asthma ( lives with them)      Current Outpatient Prescriptions:  .  aspirin (ASPIRIN LOW DOSE) 81 MG chewable tablet, Chew 1 tablet by mouth daily., Disp: , Rfl:  .  atorvastatin (LIPITOR) 40 MG tablet, Take 1 tablet (40 mg total) by mouth every  evening., Disp: 90 tablet, Rfl: 1 .  B-12, Methylcobalamin, 1000 MCG SUBL, Place 1 tablet under the tongue daily., Disp: 30 tablet, Rfl: 0 .  Blood Glucose Monitoring Suppl (ONE TOUCH ULTRA 2) w/Device KIT, See admin instructions., Disp: , Rfl: 0 .  Cholecalciferol (VITAMIN D) 2000 units CAPS, Take 1 capsule (2,000 Units total) by mouth daily., Disp: 30 capsule, Rfl: 0 .  Dapagliflozin-Metformin HCl ER (XIGDUO XR) 08-998 MG TB24, Take 2 tablets by mouth daily., Disp: 60 tablet, Rfl: 1 .  Dulaglutide (TRULICITY) 1.5 EE/1.0OF SOPN, Inject 1.5 mg into the skin once a week., Disp: 4 pen, Rfl: 2 .  ketoconazole (NIZORAL) 2 % cream, Apply 1 application topically 2 (two) times daily., Disp: 30 g, Rfl: 0 .  linaclotide (LINZESS) 145 MCG CAPS capsule, Take 1 capsule (145 mcg total) by mouth daily before  breakfast., Disp: 30 capsule, Rfl: 11 .  loratadine (CLARITIN) 10 MG tablet, TAKE 1 TABLET (10 MG TOTAL) BY MOUTH DAILY., Disp: 30 tablet, Rfl: 3 .  omeprazole (PRILOSEC) 40 MG capsule, Take 1 capsule (40 mg total) by mouth daily., Disp: 30 capsule, Rfl: 0 .  ONE TOUCH ULTRA TEST test strip, CHECK FASTING BLOOD SUGARS TWICE DAILY, Disp: 100 each, Rfl: 2 .  ONETOUCH DELICA LANCETS FINE MISC, See admin instructions., Disp: , Rfl: 0 .  Polyethylene Glycol 3350 POWD, Take by mouth., Disp: , Rfl:  .  ranitidine (ZANTAC) 150 MG tablet, Take 1 tablet (150 mg total) by mouth 2 (two) times daily., Disp: 90 tablet, Rfl: 1 .  telmisartan-hydrochlorothiazide (MICARDIS HCT) 80-25 MG tablet, TAKE 1 TABLET BY MOUTH DAILY. *INS ONLY PAYS FOR 30 DAYS*, Disp: 90 tablet, Rfl: 1 .  valACYclovir (VALTREX) 500 MG tablet, Take 1 tablet (500 mg total) by mouth 3 (three) times daily., Disp: 90 tablet, Rfl: 1 .  venlafaxine XR (EFFEXOR-XR) 75 MG 24 hr capsule, TAKE 1 CAPSULE (75 MG TOTAL) BY MOUTH DAILY., Disp: 90 capsule, Rfl: 1  Current Facility-Administered Medications:  .  0.9 %  sodium chloride infusion, 500 mL, Intravenous, Continuous, Ladene Artist, MD  Allergies  Allergen Reactions  . Penicillins Swelling     ROS  Constitutional: Negative for fever or weight change.  Respiratory: Negative for cough and shortness of breath.   Cardiovascular: Negative for chest pain or palpitations.  Gastrointestinal: Negative for abdominal pain, no bowel changes.  Musculoskeletal: Negative for gait problem or joint swelling.  Skin: Negative for rash.  Neurological: Negative for dizziness , positive for intermittent headache.  No other specific complaints in a complete review of systems (except as listed in HPI above).  Objective  Vitals:   08/25/16 0949  BP: 130/76  Pulse: 94  Resp: 16  Temp: 98 F (36.7 C)  TempSrc: Oral  SpO2: 97%  Weight: 256 lb (116.1 kg)  Height: 5' 4"  (1.626 m)    Body mass index  is 43.94 kg/m.  Physical Exam  Constitutional: Patient appears well-developed and obese . No distress.  HENT: Head: Normocephalic and atraumatic. Ears: B TMs ok, no erythema or effusion; Nose: Nose normal. Mouth/Throat: Oropharynx is clear and moist. No oropharyngeal exudate.  Eyes: Conjunctivae and EOM are normal. Pupils are equal, round, and reactive to light. No scleral icterus.  Neck: Normal range of motion. Neck supple. No JVD present. No thyromegaly present.  Cardiovascular: Normal rate, regular rhythm and normal heart sounds.  No murmur heard. No BLE edema. Pulmonary/Chest: Effort normal and breath sounds normal. No respiratory distress. Abdominal: Soft.  Bowel sounds are normal, no distension. There is no tenderness. no masses Breast: no lumps or masses, no nipple discharge or rashes FEMALE GENITALIA:  She will see gyn RECTAL: not done, colonoscopy up to date Musculoskeletal: Normal range of motion, no joint effusions. No gross deformities Neurological: he is alert and oriented to person, place, and time. No cranial nerve deficit. Coordination, balance, strength, speech and gait are normal.  Skin:  Dry skin, some scarring from mosquito bites, some moles but per patient no changes. She has acanthosis nigricans - discussed referral to Dermatologist since it is bothering her but she wants to hold off for now.  Psychiatric: Patient has a normal mood and affect. behavior is normal. Judgment and thought content normal.  Recent Results (from the past 2160 hour(s))  POCT HgB A1C     Status: Abnormal   Collection Time: 06/22/16 11:09 AM  Result Value Ref Range   Hemoglobin A1C 7.1     Diabetic Foot Exam: Diabetic Foot Exam - Simple   Simple Foot Form Diabetic Foot exam was performed with the following findings:  Yes 08/25/2016 10:26 AM  Visual Inspection See comments:  Yes Sensation Testing Intact to touch and monofilament testing bilaterally:  Yes Pulse Check Posterior Tibialis and  Dorsalis pulse intact bilaterally:  Yes Comments Dry skin and thick toenails      PHQ2/9: Depression screen [facility] 2/9 08/25/2016 03/25/2016 12/09/2015 07/07/2015 03/27/2015  Decreased Interest 0 0 0 0 0  Down, Depressed, Hopeless 0 0 0 0 1  PHQ - 2 Score 0 0 0 0 1     Fall Risk: Fall Risk  08/25/2016 03/25/2016 12/09/2015 07/07/2015 03/27/2015  Falls in the past year? No No No No No     Functional Status Survey: Is the patient deaf or have difficulty hearing?: No Does the patient have difficulty seeing, even when wearing glasses/contacts?: No Does the patient have difficulty concentrating, remembering, or making decisions?: No Does the patient have difficulty walking or climbing stairs?: No Does the patient have difficulty dressing or bathing?: No Does the patient have difficulty doing errands alone such as visiting a doctor's office or shopping?: No   Assessment & Plan  1. Well woman exam  Discussed importance of 150 minutes of physical activity weekly, eat two servings of fish weekly, eat one serving of tree nuts ( cashews, pistachios, pecans, almonds.Marland Kitchen) every other day, eat 6 servings of fruit/vegetables daily and drink plenty of water and avoid sweet beverages.   - Lipid panel - COMPLETE METABOLIC PANEL WITH GFR - CBC with Differential/Platelet   2. Breast cancer screening  - MM Digital Screening; Future  3. Cervical cancer screening  Pap smear up to date   4. Uncontrolled type 2 diabetes mellitus with nonproliferative retinopathy (HCC)  - Dapagliflozin-Metformin HCl ER (XIGDUO XR) 08-998 MG TB24; Take 2 tablets by mouth daily.  Dispense: 60 tablet; Refill: 1  5. Post-menopausal bleeding  - Ambulatory referral to Gynecology ( she used to see Dr. Satira Anis at [facility])  - TSH - FSH/LH - Prolactin  6. B12 deficiency  - Vitamin B12  7. Vitamin D deficiency  - VITAMIN D 25 Hydroxy (Vit-D Deficiency, Fractures)

## 2016-08-26 LAB — LIPID PANEL
CHOLESTEROL: 149 mg/dL (ref <200)
HDL: 42 mg/dL — ABNORMAL LOW (ref >50)
LDL CALC: 83 mg/dL (ref <100)
Total CHOL/HDL Ratio: 3.5 Ratio (ref <5.0)
Triglycerides: 120 mg/dL (ref <150)
VLDL: 24 mg/dL (ref <30)

## 2016-08-26 LAB — FSH/LH
FSH: 97.6 m[IU]/mL
LH: 46.6 m[IU]/mL

## 2016-08-26 LAB — COMPLETE METABOLIC PANEL WITH GFR
ALK PHOS: 111 U/L (ref 33–130)
ALT: 16 U/L (ref 6–29)
AST: 18 U/L (ref 10–35)
Albumin: 3.9 g/dL (ref 3.6–5.1)
BUN: 14 mg/dL (ref 7–25)
CHLORIDE: 103 mmol/L (ref 98–110)
CO2: 28 mmol/L (ref 20–31)
Calcium: 9.3 mg/dL (ref 8.6–10.4)
Creat: 0.84 mg/dL (ref 0.50–1.05)
GFR, Est African American: >89 mL/min (ref >=60)
GFR, Est Non African American: 78 mL/min (ref >=60)
GLUCOSE: 137 mg/dL — AB (ref 65–99)
POTASSIUM: 3.3 mmol/L — AB (ref 3.5–5.3)
SODIUM: 143 mmol/L (ref 135–146)
Total Bilirubin: 0.3 mg/dL (ref 0.2–1.2)
Total Protein: 6.5 g/dL (ref 6.1–8.1)

## 2016-08-26 LAB — PROLACTIN: PROLACTIN: 6.9 ng/mL

## 2016-08-26 LAB — VITAMIN D 25 HYDROXY (VIT D DEFICIENCY, FRACTURES): Vit D, 25-Hydroxy: 41 ng/mL (ref 30–100)

## 2016-08-29 ENCOUNTER — Encounter: Payer: Self-pay | Admitting: Gastroenterology

## 2016-09-19 ENCOUNTER — Encounter: Payer: 59 | Admitting: Obstetrics & Gynecology

## 2016-09-24 ENCOUNTER — Other Ambulatory Visit: Payer: Self-pay | Admitting: Family Medicine

## 2016-09-25 ENCOUNTER — Other Ambulatory Visit: Payer: Self-pay | Admitting: Family Medicine

## 2016-09-25 DIAGNOSIS — E1121 Type 2 diabetes mellitus with diabetic nephropathy: Secondary | ICD-10-CM

## 2016-09-28 ENCOUNTER — Ambulatory Visit (INDEPENDENT_AMBULATORY_CARE_PROVIDER_SITE_OTHER): Payer: 59 | Admitting: Family Medicine

## 2016-09-28 ENCOUNTER — Encounter: Payer: Self-pay | Admitting: Family Medicine

## 2016-09-28 VITALS — BP 132/66 | HR 119 | Temp 98.5°F | Resp 18 | Ht 64.0 in | Wt 257.0 lb

## 2016-09-28 DIAGNOSIS — I10 Essential (primary) hypertension: Secondary | ICD-10-CM | POA: Diagnosis not present

## 2016-09-28 DIAGNOSIS — E668 Other obesity: Secondary | ICD-10-CM

## 2016-09-28 DIAGNOSIS — E113299 Type 2 diabetes mellitus with mild nonproliferative diabetic retinopathy without macular edema, unspecified eye: Secondary | ICD-10-CM

## 2016-09-28 DIAGNOSIS — IMO0002 Reserved for concepts with insufficient information to code with codable children: Secondary | ICD-10-CM

## 2016-09-28 DIAGNOSIS — E1165 Type 2 diabetes mellitus with hyperglycemia: Secondary | ICD-10-CM | POA: Diagnosis not present

## 2016-09-28 DIAGNOSIS — E1121 Type 2 diabetes mellitus with diabetic nephropathy: Secondary | ICD-10-CM

## 2016-09-28 DIAGNOSIS — M2560 Stiffness of unspecified joint, not elsewhere classified: Secondary | ICD-10-CM

## 2016-09-28 DIAGNOSIS — M2550 Pain in unspecified joint: Secondary | ICD-10-CM

## 2016-09-28 DIAGNOSIS — E785 Hyperlipidemia, unspecified: Secondary | ICD-10-CM | POA: Diagnosis not present

## 2016-09-28 DIAGNOSIS — E669 Obesity, unspecified: Secondary | ICD-10-CM

## 2016-09-28 LAB — POCT GLYCOSYLATED HEMOGLOBIN (HGB A1C): Hemoglobin A1C: 7.7

## 2016-09-28 MED ORDER — DULAGLUTIDE 1.5 MG/0.5ML ~~LOC~~ SOAJ: 1.5000 mg | SUBCUTANEOUS | 2 refills | Status: DC

## 2016-09-28 MED ORDER — DAPAGLIFLOZIN PRO-METFORMIN ER 10-1000 MG PO TB24: 1.0000 | ORAL_TABLET | Freq: Every day | ORAL | 2 refills | Status: DC

## 2016-09-28 NOTE — Progress Notes (Signed)
Name: Wendy [name]   MRN: [id number]    DOB: 1961-01-27   Date:09/28/2016       Progress Note  Subjective  Chief Complaint  Chief Complaint  Patient presents with  . Medication Refill    3 month F/U  . Diabetes    If patient takes 2 Xigduo daily they will make her nausea, she can only tolerate take one daily, Low-139 Average-150's Highest-160's  . Gastroesophageal Reflux    Controlled  . Hypertension    Edema in ankles and feet  . Hyperlipidemia    Bones achy constantly    HPI  DM with nephropathy, retinopathy: she has been taking Trulicity again, but only taking half dose of Xigduo, could not tolerate two daily, hgbA1C went up from  7.1% to 7.7% today. She is up to date with eye exam, on [facility]. Marland Kitchen She refuses to see dietician, she states she is avoid sodas and is eating better. FSBS at home has been around 160's fasting but she did not bring her log  HTN: taking medications as indicated, bp is at goal. No chest pain, palpitation or SOB  Post-menopausal bleeding: she is post-menopausal, but she has noticed light pink with symptoms of a cycle such as bloating, weight gain . She states it happens monthly, she made an appointment with GYN but went back to work and missed her appointment, explained importance of keeping appointment   Hyperlipidemia: she states she is back on Atorvastatin, no chest pain.   Snoring: she did not go to sleep study, and she did not re-schedule it. She always feels tired. Explained importance of sleep study. There is an increase risk of heart attacks and strokes in patient with sleep apnea that are not treated.   B12 and vitamin D deficiency: she is taking supplements  Joint aches and stiffness: she has been working again for the past 2 weeks, she stands at an [employer], however she states pain started months before but worse since new job. States bones feels sore, stiff joints lasts about 10 minutes in am's.  Extremity obesity: weight has gone up she  stopped skipping meals, eating more salads.    Patient Active Problem List   Diagnosis Date Noted  . Mild nonproliferative diabetic retinopathy of both eyes associated with type 2 diabetes mellitus ([place]) 06/09/2016  . Uncontrolled type 2 diabetes mellitus with nonproliferative retinopathy ([place]) 03/01/2016  . Vitamin D deficiency 07/07/2015  . Vitamin B12 deficiency 07/07/2015  . Snoring 12/25/2014  . Allergic rhinitis, seasonal 12/14/2014  . Chronic constipation 12/14/2014  . Diabetes mellitus with renal manifestation ([place]) 12/14/2014  . Dyslipidemia 12/14/2014  . Dermatitis, eczematoid 12/14/2014  . Edema extremities 12/14/2014  . Essential (primary) hypertension 12/14/2014  . Gastro-esophageal reflux disease without esophagitis 12/14/2014  . Genital herpes in women 12/14/2014  . Extreme obesity 12/14/2014  . Female climacteric state 12/14/2014    Past Surgical History:  Procedure Laterality Date  . CESAREAN SECTION  1993  . tonsillectomy and adnoidectomy    . WISDOM TOOTH EXTRACTION      Family History  Problem Relation Age of Onset  . Diabetes Mother   . Asthma Mother   . Heart disease Mother   . Heart disease Father   . Prostate cancer Father   . Other Father        blockages in stomach  . Kidney disease Paternal Grandmother   . Asthma Son   . Autism Son   . Breast cancer Neg Hx   .  Colon cancer Neg Hx   . Esophageal cancer Neg Hx   . Pancreatic cancer Neg Hx   . Rectal cancer Neg Hx   . Stomach cancer Neg Hx     Social History   Social History  . Marital status: Married    Spouse name: N/A  . Number of children: 1  . Years of education: N/A   Occupational History  . Not on file.   Social History Main Topics  . Smoking status: Former Smoker    Years: 20.00    Types: Cigarettes    Start date: 04/19/1971    Quit date: 04/19/1991  . Smokeless tobacco: Never Used  . Alcohol use No  . Drug use: No  . Sexual activity: Yes    Partners: Male    Birth  control/ protection: Post-menopausal   Other Topics Concern  . Not on file   Social History Narrative   She lost her job Dec 2017 , worked for 36 years at the same company .    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  aspirin (ASPIRIN LOW DOSE) 81 MG chewable tablet, Chew 1 tablet by mouth daily., Disp: , Rfl:  .  atorvastatin (LIPITOR) 40 MG tablet, Take 1 tablet (40 mg total) by mouth every evening., Disp: 90 tablet, Rfl: 1 .  B-12, Methylcobalamin, 1000 MCG SUBL, Place 1 tablet under the tongue daily., Disp: 30 tablet, Rfl: 0 .  Blood Glucose Monitoring Suppl (ONE TOUCH ULTRA 2) w/Device KIT, See admin instructions., Disp: , Rfl: 0 .  Cholecalciferol (VITAMIN D) 2000 units CAPS, Take 1 capsule (2,000 Units total) by mouth daily., Disp: 30 capsule, Rfl: 0 .  Dulaglutide (TRULICITY) 1.5 RA/0.7MA SOPN, Inject 1.5 mg into the skin once a week., Disp: 4 pen, Rfl: 2 .  loratadine (CLARITIN) 10 MG tablet, TAKE 1 TABLET (10 MG TOTAL) BY MOUTH DAILY., Disp: 30 tablet, Rfl: 3 .  ONE TOUCH ULTRA TEST test strip, CHECK FASTING BLOOD SUGARS TWICE DAILY, Disp: 100 each, Rfl: 2 .  ONETOUCH DELICA LANCETS FINE MISC, See admin instructions., Disp: , Rfl: 0 .  Polyethylene Glycol 3350 POWD, Take by mouth., Disp: , Rfl:  .  ranitidine (ZANTAC) 150 MG tablet, TAKE 1 TABLET (150 MG TOTAL) BY MOUTH 2 (TWO) TIMES DAILY., Disp: 90 tablet, Rfl: 1 .  telmisartan-hydrochlorothiazide (MICARDIS HCT) 80-25 MG tablet, TAKE 1 TABLET BY MOUTH DAILY. *INS ONLY PAYS FOR 30 DAYS*, Disp: 90 tablet, Rfl: 1 .  valACYclovir (VALTREX) 500 MG tablet, Take 1 tablet (500 mg total) by mouth 3 (three) times daily., Disp: 90 tablet, Rfl: 1 .  venlafaxine XR (EFFEXOR-XR) 75 MG 24 hr capsule, TAKE 1 CAPSULE (75 MG TOTAL) BY MOUTH DAILY., Disp: 90 capsule, Rfl: 1 .   Dapagliflozin-Metformin HCl ER (XIGDUO XR) 01-999 MG TB24, Take 1 tablet by mouth daily., Disp: 30 tablet, Rfl: 2 .  linaclotide (LINZESS) 145 MCG CAPS capsule, Take 1 capsule (145 mcg total) by mouth daily before breakfast. (Patient not taking: Reported on 09/28/2016), Disp: 30 capsule, Rfl: 11 .  omeprazole (PRILOSEC) 40 MG capsule, Take 1 capsule (40 mg total) by mouth daily. (Patient not taking: Reported on 09/28/2016), Disp: 30 capsule, Rfl: 0  Current Facility-Administered Medications:  .  0.9 %  sodium chloride infusion, 500 mL,  Intravenous, Continuous, Ladene Artist, MD  Allergies  Allergen Reactions  . Penicillins Swelling     ROS  Constitutional: Negative for fever or weight change.  Respiratory: Negative for cough and shortness of breath.   Cardiovascular: Negative for chest pain or palpitations.  Gastrointestinal: Negative for abdominal pain, no bowel changes.  Musculoskeletal: Positive  for gait problem, feels stiff when she first gets up, but only lasts about 10 minutes,  or joint swelling.  Skin: Negative for rash.  Neurological: Negative for dizziness or headache.  No other specific complaints in a complete review of systems (except as listed in HPI above).  Objective  Vitals:   09/28/16 1533  BP: 132/66  Pulse: (!) 119  Resp: 18  Temp: 98.5 F (36.9 C)  TempSrc: Oral  SpO2: 96%  Weight: 257 lb (116.6 kg)  Height: 5' 4"  (1.626 m)    Body mass index is 44.11 kg/m.  Physical Exam  Constitutional: Patient appears well-developed and well-nourished. Obese No distress.  HEENT: head atraumatic, normocephalic, pupils equal and reactive to light, neck supple, throat within normal limits Cardiovascular: Normal rate, regular rhythm and normal heart sounds.  No murmur heard. Trace ankle BLE edema. Pulmonary/Chest: Effort normal and breath sounds normal. No respiratory distress. Abdominal: Soft.  There is no tenderness. Muscular Skeletal: no synovitis, or  increase in warmth, crepitus with extension of both knees Psychiatric: Patient has a normal mood and affect. behavior is normal. Judgment and thought content normal.  Recent Results (from the past 2160 hour(s))  TSH     Status: None   Collection Time: 08/25/16 11:00 AM  Result Value Ref Range   TSH 3.05 mIU/L    Comment:   Reference Range   > or = 20 Years  0.40-4.50   Pregnancy Range First trimester  0.26-2.66 Second trimester 0.55-2.73 Third trimester  0.43-2.91     Vitamin B12     Status: None   Collection Time: 08/25/16 11:00 AM  Result Value Ref Range   Vitamin B-12 463 200 - 1,100 pg/mL  VITAMIN D 25 Hydroxy (Vit-D Deficiency, Fractures)     Status: None   Collection Time: 08/25/16 11:00 AM  Result Value Ref Range   Vit D, 25-Hydroxy 41 30 - 100 ng/mL    Comment: Vitamin D Status           25-OH Vitamin D        Deficiency                <20 ng/mL        Insufficiency         20 - 29 ng/mL        Optimal             > or = 30 ng/mL   For 25-OH Vitamin D testing on patients on D2-supplementation and patients for whom quantitation of D2 and D3 fractions is required, the QuestAssureD 25-OH VIT D, (D2,D3), LC/MS/MS is recommended: order code [id number] (patients > 2 yrs).   Lipid panel     Status: Abnormal   Collection Time: 08/25/16 11:00 AM  Result Value Ref Range   Cholesterol 149 <200 mg/dL   Triglycerides 120 <150 mg/dL   HDL 42 (L) >50 mg/dL   Total CHOL/HDL Ratio 3.5 <5.0 Ratio   VLDL 24 <30 mg/dL   LDL Cholesterol 83 <100 mg/dL  COMPLETE METABOLIC PANEL WITH GFR     Status: Abnormal   Collection Time: 08/25/16 11:00 AM  Result  Value Ref Range   Sodium 143 135 - 146 mmol/L   Potassium 3.3 (L) 3.5 - 5.3 mmol/L   Chloride 103 98 - 110 mmol/L   CO2 28 20 - 31 mmol/L   Glucose, Bld 137 (H) 65 - 99 mg/dL   BUN 14 7 - 25 mg/dL   Creat 0.84 0.50 - 1.05 mg/dL    Comment:   For patients > or = 56 years of age: The upper reference limit for Creatinine is  approximately 13% higher for people identified as African-American.      Total Bilirubin 0.3 0.2 - 1.2 mg/dL   Alkaline Phosphatase 111 33 - 130 U/L   AST 18 10 - 35 U/L   ALT 16 6 - 29 U/L   Total Protein 6.5 6.1 - 8.1 g/dL   Albumin 3.9 3.6 - 5.1 g/dL   Calcium 9.3 8.6 - 10.4 mg/dL   GFR, Est African American >89 >=60 mL/min   GFR, Est Non African American 78 >=60 mL/min  CBC with Differential/Platelet     Status: Abnormal   Collection Time: 08/25/16 11:00 AM  Result Value Ref Range   WBC 9.5 3.8 - 10.8 K/uL   RBC 4.70 3.80 - 5.10 MIL/uL   Hemoglobin 12.7 11.7 - 15.5 g/dL   HCT 39.6 35.0 - 45.0 %   MCV 84.3 80.0 - 100.0 fL   MCH 27.0 27.0 - 33.0 pg   MCHC 32.1 32.0 - 36.0 g/dL   RDW 15.4 (H) 11.0 - 15.0 %   Platelets 302 140 - 400 K/uL   MPV 10.8 7.5 - 12.5 fL   Neutro Abs 6,175 1,500 - 7,800 cells/uL   Lymphs Abs 2,660 850 - 3,900 cells/uL   Monocytes Absolute 475 200 - 950 cells/uL   Eosinophils Absolute 190 15 - 500 cells/uL   Basophils Absolute 0 0 - 200 cells/uL   Neutrophils Relative % 65 %   Lymphocytes Relative 28 %   Monocytes Relative 5 %   Eosinophils Relative 2 %   Basophils Relative 0 %   Smear Review Criteria for review not met   FSH/LH     Status: None   Collection Time: 08/25/16 11:00 AM  Result Value Ref Range   FSH 97.6 mIU/mL    Comment:   Reference Range Female >=55 years of age: Follicular Phase 7.8-24.2 Mid-Cycle Peak   3.1-17.7 Luteal Phase     1.5-9.1 Postmenopausal   23.0-116.3      LH 46.6 mIU/mL    Comment: Reference Range Female  Follicular Phase 3.5-36.1 mIU/mL  Mid-Cycle Peak 8.7-76.3 mIU/mL  Luteal Phase 0.5-16.9 mIU/mL  Postmenopausal 10.0-54.7 mIU/mL     Prolactin     Status: None   Collection Time: 08/25/16 11:00 AM  Result Value Ref Range   Prolactin 6.9 ng/mL    Comment: Reference Range Adult Female  Non-Pregnant 3.0-30.0 ng/mL  Pregnant 10.0-209.0 ng/mL  Postmenopausal 2.0-20.0 ng/mL     POCT HgB A1C      Status: Abnormal   Collection Time: 09/28/16  3:40 PM  Result Value Ref Range   Hemoglobin A1C 7.7     PHQ2/9: Depression screen [facility] 2/9 08/25/2016 03/25/2016 12/09/2015 07/07/2015 03/27/2015  Decreased Interest 0 0 0 0 0  Down, Depressed, Hopeless 0 0 0 0 1  PHQ - 2 Score 0 0 0 0 1     Fall Risk: Fall Risk  08/25/2016 03/25/2016 12/09/2015 07/07/2015 03/27/2015  Falls in the past year? No No No No No  Assessment & Plan  1. Uncontrolled type 2 diabetes mellitus with nonproliferative retinopathy ([place])  hgbA1C has gone up, but only taking on Xigduo 08/998, because she has been unable to tolerate two daily, we will change to 01/999 - POCT HgB A1C - Dulaglutide (TRULICITY) 1.5 TV/3.9PB SOPN; Inject 1.5 mg into the skin once a week.  Dispense: 4 pen; Refill: 2 - Dapagliflozin-Metformin HCl ER (XIGDUO XR) 01-999 MG TB24; Take 1 tablet by mouth daily.  Dispense: 30 tablet; Refill: 2  2. Dyslipidemia  Continue medication   3. Essential (primary) hypertension  At goal, continue medication  4. Extreme obesity  Weight has been stable.  5. Type 2 diabetes mellitus with diabetic nephropathy, without long-term current use of insulin (HCC)  - Dulaglutide (TRULICITY) 1.5 HE/1.7WN SOPN; Inject 1.5 mg into the skin once a week.  Dispense: 4 pen; Refill: 2 - Dapagliflozin-Metformin HCl ER (XIGDUO XR) 01-999 MG TB24; Take 1 tablet by mouth daily.  Dispense: 30 tablet; Refill: 2  6. Joint stiffness  Check labs, it may be OA  7. Arthralgia, unspecified joint  - Rheumatoid Factor - C-reactive protein - Sedimentation rate

## 2016-10-20 ENCOUNTER — Other Ambulatory Visit: Payer: Self-pay | Admitting: Family Medicine

## 2016-10-20 DIAGNOSIS — E1121 Type 2 diabetes mellitus with diabetic nephropathy: Secondary | ICD-10-CM

## 2016-10-20 NOTE — Telephone Encounter (Signed)
Patient requesting refill of Trulicity to [facility].  

## 2016-11-02 ENCOUNTER — Other Ambulatory Visit: Payer: Self-pay

## 2016-11-02 DIAGNOSIS — E113299 Type 2 diabetes mellitus with mild nonproliferative diabetic retinopathy without macular edema, unspecified eye: Secondary | ICD-10-CM

## 2016-11-02 DIAGNOSIS — E1121 Type 2 diabetes mellitus with diabetic nephropathy: Secondary | ICD-10-CM

## 2016-11-02 DIAGNOSIS — E1165 Type 2 diabetes mellitus with hyperglycemia: Secondary | ICD-10-CM

## 2016-11-02 DIAGNOSIS — IMO0002 Reserved for concepts with insufficient information to code with codable children: Secondary | ICD-10-CM

## 2016-11-02 NOTE — Telephone Encounter (Signed)
Patient requesting refill of Trulicity to [facility].  

## 2016-11-03 MED ORDER — DULAGLUTIDE 1.5 MG/0.5ML ~~LOC~~ SOAJ: 1.5000 mg | SUBCUTANEOUS | 2 refills | Status: DC

## 2016-12-14 ENCOUNTER — Other Ambulatory Visit: Payer: Self-pay

## 2016-12-14 MED ORDER — FLUTICASONE PROPIONATE 50 MCG/ACT NA SUSP: 2.0000 | Freq: Every day | NASAL | 6 refills | Status: DC

## 2016-12-14 NOTE — Telephone Encounter (Signed)
Patient requesting refill of Flonase to [facility]. 

## 2016-12-15 ENCOUNTER — Ambulatory Visit (INDEPENDENT_AMBULATORY_CARE_PROVIDER_SITE_OTHER): Payer: 59 | Admitting: Family Medicine

## 2016-12-15 ENCOUNTER — Encounter: Payer: Self-pay | Admitting: Family Medicine

## 2016-12-15 VITALS — BP 138/76 | HR 100 | Temp 97.6°F | Resp 16 | Ht 64.0 in | Wt 249.3 lb

## 2016-12-15 DIAGNOSIS — R5383 Other fatigue: Secondary | ICD-10-CM

## 2016-12-15 DIAGNOSIS — J0100 Acute maxillary sinusitis, unspecified: Secondary | ICD-10-CM

## 2016-12-15 DIAGNOSIS — R6883 Chills (without fever): Secondary | ICD-10-CM | POA: Diagnosis not present

## 2016-12-15 DIAGNOSIS — R0981 Nasal congestion: Secondary | ICD-10-CM | POA: Diagnosis not present

## 2016-12-15 MED ORDER — AZITHROMYCIN 500 MG PO TABS: 500.0000 mg | ORAL_TABLET | Freq: Every day | ORAL | 0 refills | Status: DC

## 2016-12-15 NOTE — Progress Notes (Signed)
Name: Wendy [name]   MRN: [id number]    DOB: 1960/08/17   Date:12/15/2016       Progress Note  Subjective  Chief Complaint  Chief Complaint  Patient presents with  . Sinus Pressure    Onset-couple of weeks, achy all over, sweating. Has tried Ibuprofen and Flonase with some relief.l     HPI  AR/sinusitis: she states that over the past few weeks she has noticed body aches, fatigue, and she thought it was allergy symptoms with nasal congestion, facial pressure, sneezing, but she is getting worse, having lack of appetite, chills and breaking out in sweats. She just started Flonase yesterday with mild improvement of congestion, but has noticed more facial pressure. She also has noticed some post-nasal drainage, but no sore throat. No rashes. She feels like she has a fever.   DM: she is not checking glucose, but is eating more ice cream, discussed importance of resuming her diet.   Patient Active Problem List   Diagnosis Date Noted  . Mild nonproliferative diabetic retinopathy of both eyes associated with type 2 diabetes mellitus ([place]) 06/09/2016  . Uncontrolled type 2 diabetes mellitus with nonproliferative retinopathy ([place]) 03/01/2016  . Vitamin D deficiency 07/07/2015  . Vitamin B12 deficiency 07/07/2015  . Snoring 12/25/2014  . Allergic rhinitis, seasonal 12/14/2014  . Chronic constipation 12/14/2014  . Diabetes mellitus with renal manifestation ([place]) 12/14/2014  . Dyslipidemia 12/14/2014  . Dermatitis, eczematoid 12/14/2014  . Edema extremities 12/14/2014  . Essential (primary) hypertension 12/14/2014  . Gastro-esophageal reflux disease without esophagitis 12/14/2014  . Genital herpes in women 12/14/2014  . Extreme obesity 12/14/2014  . Female climacteric state 12/14/2014    Past Surgical History:  Procedure Laterality Date  . CESAREAN SECTION  1993  . tonsillectomy and adnoidectomy    . WISDOM TOOTH EXTRACTION      Family History  Problem Relation Age of Onset  . Diabetes  Mother   . Asthma Mother   . Heart disease Mother   . Heart disease Father   . Prostate cancer Father   . Other Father        blockages in stomach  . Kidney disease Paternal Grandmother   . Asthma Son   . Autism Son   . Breast cancer Neg Hx   . Colon cancer Neg Hx   . Esophageal cancer Neg Hx   . Pancreatic cancer Neg Hx   . Rectal cancer Neg Hx   . Stomach cancer Neg Hx     Social History   Social History  . Marital status: Married    Spouse name: N/A  . Number of children: 1  . Years of education: N/A   Occupational History  . Not on file.   Social History Main Topics  . Smoking status: Former Smoker    Years: 20.00    Types: Cigarettes    Start date: 04/19/1971    Quit date: 04/19/1991  . Smokeless tobacco: Never Used  . Alcohol use No  . Drug use: No  . Sexual activity: Yes    Partners: Male    Birth control/ protection: Post-menopausal   Other Topics Concern  . Not on file   Social History Narrative   She lost her job Dec 2017 , worked for 36 years at the same company .    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  aspirin (ASPIRIN LOW DOSE) 81 MG chewable tablet, Chew 1 tablet by mouth daily., Disp: , Rfl:  .  atorvastatin (LIPITOR) 40 MG tablet, Take 1 tablet (40 mg total) by mouth every evening., Disp: 90 tablet, Rfl: 1 .  B-12, Methylcobalamin, 1000 MCG SUBL, Place 1 tablet under the tongue daily., Disp: 30 tablet, Rfl: 0 .  Blood Glucose Monitoring Suppl (ONE TOUCH ULTRA 2) w/Device KIT, See admin instructions., Disp: , Rfl: 0 .  Cholecalciferol (VITAMIN D) 2000 units CAPS, Take 1 capsule (2,000 Units total) by mouth daily., Disp: 30 capsule, Rfl: 0 .  Dapagliflozin-Metformin HCl ER (XIGDUO XR) 01-999 MG TB24, Take 1 tablet by mouth daily., Disp: 30 tablet, Rfl: 2 .  Dulaglutide (TRULICITY)  1.5 ZW/2.5EN SOPN, Inject 1.5 mg into the skin once a week., Disp: 4 pen, Rfl: 2 .  fluticasone (FLONASE) 50 MCG/ACT nasal spray, Place 2 sprays into both nostrils daily., Disp: 16 g, Rfl: 6 .  loratadine (CLARITIN) 10 MG tablet, TAKE 1 TABLET (10 MG TOTAL) BY MOUTH DAILY., Disp: 30 tablet, Rfl: 3 .  ONE TOUCH ULTRA TEST test strip, CHECK FASTING BLOOD SUGARS TWICE DAILY, Disp: 100 each, Rfl: 2 .  ONETOUCH DELICA LANCETS FINE MISC, See admin instructions., Disp: , Rfl: 0 .  Polyethylene Glycol 3350 POWD, Take by mouth., Disp: , Rfl:  .  ranitidine (ZANTAC) 150 MG tablet, TAKE 1 TABLET (150 MG TOTAL) BY MOUTH 2 (TWO) TIMES DAILY., Disp: 90 tablet, Rfl: 1 .  telmisartan-hydrochlorothiazide (MICARDIS HCT) 80-25 MG tablet, TAKE 1 TABLET BY MOUTH DAILY. *INS ONLY PAYS FOR 30 DAYS*, Disp: 90 tablet, Rfl: 1 .  valACYclovir (VALTREX) 500 MG tablet, Take 1 tablet (500 mg total) by mouth 3 (three) times daily., Disp: 90 tablet, Rfl: 1 .  venlafaxine XR (EFFEXOR-XR) 75 MG 24 hr capsule, TAKE 1 CAPSULE (75 MG TOTAL) BY MOUTH DAILY., Disp: 90 capsule, Rfl: 1 .  linaclotide (LINZESS) 145 MCG CAPS capsule, Take 1 capsule (145 mcg total) by mouth daily before breakfast. (Patient not taking: Reported on 09/28/2016), Disp: 30 capsule, Rfl: 11 .  omeprazole (PRILOSEC) 40 MG capsule, Take 1 capsule (40 mg total) by mouth daily. (Patient not taking: Reported on 12/15/2016), Disp: 30 capsule, Rfl: 0  Allergies  Allergen Reactions  . Penicillins Swelling     ROS  Ten systems reviewed and is negative except as mentioned in HPI   Objective  Vitals:   12/15/16 1055  BP: 138/76  Pulse: 100  Resp: 16  Temp: 97.6 F (36.4 C)  TempSrc: Oral  SpO2: 97%  Weight: 249 lb 4.8 oz (113.1 kg)  Height: 5' 4"  (1.626 m)    Body mass index is 42.79 kg/m.  Physical Exam  Constitutional: Patient appears well-developed and well-nourished. Obese  No distress.  HEENT: head atraumatic, normocephalic, pupils equal and  reactive to light, ears : normal TM, boggy turbinates, tender during percussion of maxillary sinus, neck supple, throat within normal limits Cardiovascular: Normal rate, regular rhythm and normal heart sounds.  No murmur heard. No BLE edema. Pulmonary/Chest: Effort normal and breath sounds normal. No respiratory distress. Abdominal: Soft.  There is no tenderness. Psychiatric: Patient has a normal mood and affect. behavior is normal. Judgment and thought content normal.  Recent Results (from the past 2160 hour(s))  POCT HgB A1C     Status: Abnormal   Collection Time: 09/28/16  3:40 PM  Result Value Ref  Range   Hemoglobin A1C 7.7     PHQ2/9: Depression screen [facility] 2/9 12/15/2016 08/25/2016 03/25/2016 12/09/2015 07/07/2015  Decreased Interest 0 0 0 0 0  Down, Depressed, Hopeless 0 0 0 0 0  PHQ - 2 Score 0 0 0 0 0    Fall Risk: Fall Risk  12/15/2016 08/25/2016 03/25/2016 12/09/2015 07/07/2015  Falls in the past year? No No No No No     Functional Status Survey: Is the patient deaf or have difficulty hearing?: No Does the patient have difficulty seeing, even when wearing glasses/contacts?: No Does the patient have difficulty concentrating, remembering, or making decisions?: No Does the patient have difficulty walking or climbing stairs?: No Does the patient have difficulty dressing or bathing?: No Does the patient have difficulty doing errands alone such as visiting a doctor's office or shopping?: No    Assessment & Plan  1. Nasal congestion  Continue nasal spray, may also try nasal saline  2. Chills  It may be secondary to allergies, but has symptoms of sinusitis  3. Other fatigue  Advised rest, fluids and otc cold medication  4. Subacute maxillary sinusitis  - azithromycin (ZITHROMAX) 500 MG tablet; Take 1 tablet (500 mg total) by mouth daily.  Dispense: 3 tablet; Refill: 0

## 2016-12-30 ENCOUNTER — Ambulatory Visit: Payer: 59 | Admitting: Family Medicine

## 2017-01-18 ENCOUNTER — Other Ambulatory Visit: Payer: Self-pay | Admitting: Family Medicine

## 2017-01-18 DIAGNOSIS — J302 Other seasonal allergic rhinitis: Secondary | ICD-10-CM

## 2017-01-18 NOTE — Telephone Encounter (Signed)
She needs a follow up, seen last month for acute problem. Please see if she can come in this week

## 2017-01-18 NOTE — Telephone Encounter (Signed)
Patient requesting refill of Loratadine to [facility].

## 2017-01-19 NOTE — Telephone Encounter (Signed)
lvm for patient to schedule appt

## 2017-01-23 NOTE — Telephone Encounter (Signed)
lvm to schedule appt and that script has been sent to pharmacy

## 2017-02-09 ENCOUNTER — Encounter: Payer: Self-pay | Admitting: Family Medicine

## 2017-02-09 ENCOUNTER — Ambulatory Visit (INDEPENDENT_AMBULATORY_CARE_PROVIDER_SITE_OTHER): Payer: 59 | Admitting: Family Medicine

## 2017-02-09 VITALS — BP 150/86 | HR 76 | Resp 14 | Ht 64.0 in | Wt 254.0 lb

## 2017-02-09 DIAGNOSIS — R2241 Localized swelling, mass and lump, right lower limb: Secondary | ICD-10-CM

## 2017-02-09 DIAGNOSIS — E785 Hyperlipidemia, unspecified: Secondary | ICD-10-CM

## 2017-02-09 DIAGNOSIS — IMO0002 Reserved for concepts with insufficient information to code with codable children: Secondary | ICD-10-CM

## 2017-02-09 DIAGNOSIS — G8929 Other chronic pain: Secondary | ICD-10-CM

## 2017-02-09 DIAGNOSIS — I10 Essential (primary) hypertension: Secondary | ICD-10-CM | POA: Diagnosis not present

## 2017-02-09 DIAGNOSIS — Z1231 Encounter for screening mammogram for malignant neoplasm of breast: Secondary | ICD-10-CM | POA: Diagnosis not present

## 2017-02-09 DIAGNOSIS — E1165 Type 2 diabetes mellitus with hyperglycemia: Secondary | ICD-10-CM | POA: Diagnosis not present

## 2017-02-09 DIAGNOSIS — M25562 Pain in left knee: Secondary | ICD-10-CM

## 2017-02-09 DIAGNOSIS — E538 Deficiency of other specified B group vitamins: Secondary | ICD-10-CM | POA: Diagnosis not present

## 2017-02-09 DIAGNOSIS — E668 Other obesity: Secondary | ICD-10-CM | POA: Diagnosis not present

## 2017-02-09 DIAGNOSIS — E1121 Type 2 diabetes mellitus with diabetic nephropathy: Secondary | ICD-10-CM | POA: Diagnosis not present

## 2017-02-09 DIAGNOSIS — E113299 Type 2 diabetes mellitus with mild nonproliferative diabetic retinopathy without macular edema, unspecified eye: Secondary | ICD-10-CM

## 2017-02-09 DIAGNOSIS — N941 Unspecified dyspareunia: Secondary | ICD-10-CM | POA: Diagnosis not present

## 2017-02-09 DIAGNOSIS — N945 Secondary dysmenorrhea: Secondary | ICD-10-CM

## 2017-02-09 DIAGNOSIS — Z1239 Encounter for other screening for malignant neoplasm of breast: Secondary | ICD-10-CM

## 2017-02-09 LAB — POCT GLYCOSYLATED HEMOGLOBIN (HGB A1C): HEMOGLOBIN A1C: 7.5

## 2017-02-09 MED ORDER — TELMISARTAN-HCTZ 80-25 MG PO TABS: ORAL_TABLET | ORAL | 1 refills | Status: DC

## 2017-02-09 MED ORDER — ATORVASTATIN CALCIUM 40 MG PO TABS: 40.0000 mg | ORAL_TABLET | Freq: Every evening | ORAL | 1 refills | Status: DC

## 2017-02-09 MED ORDER — DULAGLUTIDE 1.5 MG/0.5ML ~~LOC~~ SOAJ: 1.5000 mg | SUBCUTANEOUS | 2 refills | Status: DC

## 2017-02-09 MED ORDER — DAPAGLIFLOZIN PRO-METFORMIN ER 10-1000 MG PO TB24: 1.0000 | ORAL_TABLET | Freq: Every day | ORAL | 2 refills | Status: DC

## 2017-02-09 NOTE — Progress Notes (Signed)
Name: Wendy [name]   MRN: [id number]    DOB: Feb 26, 1961   Date:02/09/2017       Progress Note  Subjective  Chief Complaint  Chief Complaint  Patient presents with  . Medication Refill  . Diabetes    HPI  DM with nephropathy, retinopathy: she has been taking Trulicity again,  Xigduo, could not tolerate two daily, hgbA1C went up from  7.1% to 7.7% , today down to 7.5%. She is up to date with eye exam, on ARB.  She refuses to see dietician, she states she is avoid sodas and is eating better. FSBS at home has been around 130's fasting but she did not bring her log  HTN: taking medications as indicated, bp is up today, she skipping medication this morning ( because she was coming in) . No chest pain, palpitation or SOB   Perimenopause:  She told me last visit she was post-menopausal, however today she told me she never went one full year without a cycle. I referred her to gyn because I thought is was post-menopausal bleeding but she missed appointment. She also has worsening of cramping during cycles and pain after intercourse, advised her to see gyn - she is a poor historian .  Hyperlipidemia: she states she is back on Atorvastatin, no chest pain.   Snoring: she did not go to sleep study, and she did not re-schedule it. She always feels tired. Explained importance of sleep study. There is an increase risk of heart attacks and strokes in patient with sleep apnea that are not treated.   B12 and vitamin D deficiency: she is taking supplements  Left knee pain: aching like, lateral aspect, worse with activity , some popping sensation when she moves. She has been taking ibuprofen and controls symptoms  Right foot mass: she noticed over the past couple of months, on lateral aspect, no trauma , no pain or redness, she states seems to get bigger at the end of the work day   Extremity obesity: she gained weight since last visit, she states she is eating better and walking most days, not sure  why weight is up   Patient Active Problem List   Diagnosis Date Noted  . Mild nonproliferative diabetic retinopathy of both eyes associated with type 2 diabetes mellitus ([place]) 06/09/2016  . Uncontrolled type 2 diabetes mellitus with nonproliferative retinopathy ([place]) 03/01/2016  . Vitamin D deficiency 07/07/2015  . Vitamin B12 deficiency 07/07/2015  . Snoring 12/25/2014  . Allergic rhinitis, seasonal 12/14/2014  . Chronic constipation 12/14/2014  . Diabetes mellitus with renal manifestation ([place]) 12/14/2014  . Dyslipidemia 12/14/2014  . Dermatitis, eczematoid 12/14/2014  . Edema extremities 12/14/2014  . Essential (primary) hypertension 12/14/2014  . Gastro-esophageal reflux disease without esophagitis 12/14/2014  . Genital herpes in women 12/14/2014  . Extreme obesity 12/14/2014  . Female climacteric state 12/14/2014    Past Surgical History:  Procedure Laterality Date  . CESAREAN SECTION  1993  . tonsillectomy and adnoidectomy    . WISDOM TOOTH EXTRACTION      Family History  Problem Relation Age of Onset  . Diabetes Mother   . Asthma Mother   . Heart disease Mother   . Heart disease Father   . Prostate cancer Father   . Other Father        blockages in stomach  . Kidney disease Paternal Grandmother   . Asthma Son   . Autism Son   . Breast cancer Neg Hx   . Colon cancer  Neg Hx   . Esophageal cancer Neg Hx   . Pancreatic cancer Neg Hx   . Rectal cancer Neg Hx   . Stomach cancer Neg Hx     Social History   Social History  . Marital status: Married    Spouse name: N/A  . Number of children: 1  . Years of education: N/A   Occupational History  . Not on file.   Social History Main Topics  . Smoking status: Former Smoker    Years: 20.00    Types: Cigarettes    Start date: 04/19/1971    Quit date: 04/19/1991  . Smokeless tobacco: Never Used  . Alcohol use No  . Drug use: No  . Sexual activity: Yes    Partners: Male    Birth control/ protection:  Post-menopausal   Other Topics Concern  . Not on file   Social History Narrative   She lost her job Dec 2017 , worked for 36 years at the same company .    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  aspirin (ASPIRIN LOW DOSE) 81 MG chewable tablet, Chew 1 tablet by mouth daily., Disp: , Rfl:  .  atorvastatin (LIPITOR) 40 MG tablet, Take 1 tablet (40 mg total) by mouth every evening., Disp: 90 tablet, Rfl: 1 .  B-12, Methylcobalamin, 1000 MCG SUBL, Place 1 tablet under the tongue daily., Disp: 30 tablet, Rfl: 0 .  Blood Glucose Monitoring Suppl (ONE TOUCH ULTRA 2) w/Device KIT, See admin instructions., Disp: , Rfl: 0 .  Cholecalciferol (VITAMIN D) 2000 units CAPS, Take 1 capsule (2,000 Units total) by mouth daily., Disp: 30 capsule, Rfl: 0 .  Dapagliflozin-Metformin HCl ER (XIGDUO XR) 01-999 MG TB24, Take 1 tablet by mouth daily., Disp: 30 tablet, Rfl: 2 .  Dulaglutide (TRULICITY) 1.5 WN/4.6EV SOPN, Inject 1.5 mg into the skin once a week., Disp: 4 pen, Rfl: 2 .  fluticasone (FLONASE) 50 MCG/ACT nasal spray, Place 2 sprays into both nostrils daily., Disp: 16 g, Rfl: 6 .  linaclotide (LINZESS) 145 MCG CAPS capsule, Take 1 capsule (145 mcg total) by mouth daily before breakfast., Disp: 30 capsule, Rfl: 11 .  loratadine (CLARITIN) 10 MG tablet, TAKE 1 TABLET (10 MG TOTAL) BY MOUTH DAILY., Disp: 30 tablet, Rfl: 2 .  omeprazole (PRILOSEC) 40 MG capsule, Take 1 capsule (40 mg total) by mouth daily., Disp: 30 capsule, Rfl: 0 .  ONE TOUCH ULTRA TEST test strip, CHECK FASTING BLOOD SUGARS TWICE DAILY, Disp: 100 each, Rfl: 2 .  ONETOUCH DELICA LANCETS FINE MISC, See admin instructions., Disp: , Rfl: 0 .  Polyethylene Glycol 3350 POWD, Take by mouth., Disp: , Rfl:  .  telmisartan-hydrochlorothiazide (MICARDIS HCT) 80-25 MG tablet, TAKE 1  TABLET BY MOUTH DAILY. *INS ONLY PAYS FOR 30 DAYS*, Disp: 90 tablet, Rfl: 1 .  valACYclovir (VALTREX) 500 MG tablet, Take 1 tablet (500 mg total) by mouth 3 (three) times daily., Disp: 90 tablet, Rfl: 1 .  venlafaxine XR (EFFEXOR-XR) 75 MG 24 hr capsule, TAKE 1 CAPSULE (75 MG TOTAL) BY MOUTH DAILY., Disp: 90 capsule, Rfl: 1  Allergies  Allergen Reactions  . Penicillins Swelling     ROS  Constitutional: Negative for fever, positive for mild  weight change.  Respiratory: Negative for cough and shortness of  breath.   Cardiovascular: Negative for chest pain or palpitations.  Gastrointestinal: Negative for abdominal pain, no bowel changes.  Musculoskeletal: Negative for gait problem or joint swelling.  Skin: Negative for rash.  Neurological: Negative for dizziness or headache.  No other specific complaints in a complete review of systems (except as listed in HPI above).  Objective  Vitals:   02/09/17 0934  BP: (!) 150/86  Pulse: 76  Resp: 14  SpO2: 99%  Weight: 254 lb (115.2 kg)  Height: 5' 4"  (1.626 m)    Body mass index is 43.6 kg/m.  Physical Exam  Constitutional: Patient appears well-developed and well-nourished. Obese No distress.  HEENT: head atraumatic, normocephalic, pupils equal and reactive to light, neck supple, throat within normal limits Cardiovascular: Normal rate, regular rhythm and normal heart sounds.  No murmur heard. No BLE edema. Pulmonary/Chest: Effort normal and breath sounds normal. No respiratory distress. Abdominal: Soft.  There is no tenderness. Muscular Skeletal: some swelling/mass on lateral aspect of right foot, no redness or pain during palpation, knee crepitus with extension of both knees Psychiatric: Patient has a normal mood and affect. behavior is normal. Judgment and thought content normal.  PHQ2/9: Depression screen [facility] 2/9 12/15/2016 08/25/2016 03/25/2016 12/09/2015 07/07/2015  Decreased Interest 0 0 0 0 0  Down, Depressed, Hopeless 0 0 0 0  0  PHQ - 2 Score 0 0 0 0 0     Fall Risk: Fall Risk  02/09/2017 12/15/2016 08/25/2016 03/25/2016 12/09/2015  Falls in the past year? No No No No No    Functional Status Survey: Is the patient deaf or have difficulty hearing?: No Does the patient have difficulty seeing, even when wearing glasses/contacts?: No Does the patient have difficulty concentrating, remembering, or making decisions?: No Does the patient have difficulty walking or climbing stairs?: No Does the patient have difficulty dressing or bathing?: No Does the patient have difficulty doing errands alone such as visiting a doctor's office or shopping?: No   Assessment & Plan  1. Uncontrolled type 2 diabetes mellitus with nonproliferative retinopathy (HCC)  - POCT HgB A1C   - Dapagliflozin-Metformin HCl ER (XIGDUO XR) 01-999 MG TB24; Take 1 tablet by mouth daily.  Dispense: 30 tablet; Refill: 2 - Dulaglutide (TRULICITY) 1.5 ZO/1.0RU SOPN; Inject 1.5 mg into the skin once a week.  Dispense: 4 pen; Refill: 2  Glucose improved but still above goal, she will try to improve her diet  2. Dyslipidemia  - atorvastatin (LIPITOR) 40 MG tablet; Take 1 tablet (40 mg total) by mouth every evening.  Dispense: 90 tablet; Refill: 1  3. Mass of right foot  - Ambulatory referral to Podiatry  4. Essential (primary) hypertension  - telmisartan-hydrochlorothiazide (MICARDIS HCT) 80-25 MG tablet; TAKE 1 TABLET BY MOUTH DAILY. *INS ONLY PAYS FOR 30 DAYS*  Dispense: 90 tablet; Refill: 1 Explained she needs to take medication every day, even when she comes in for follow up, she skipped dose this am.   5. Morbid obesity  Discussed with the patient the risk posed by an increased BMI. Discussed importance of portion control, calorie counting and at least 150 minutes of physical activity weekly. Avoid sweet beverages and drink more water. Eat at least 6 servings of fruit and vegetables daily   6. Type 2 diabetes mellitus with diabetic  nephropathy, without long-term current use of insulin (HCC)  - Dapagliflozin-Metformin HCl ER (XIGDUO XR) 01-999 MG TB24; Take 1 tablet by mouth daily.  Dispense: 30 tablet; Refill: 2 - Dulaglutide (TRULICITY)  1.5 MG/0.5ML SOPN; Inject 1.5 mg into the skin once a week.  Dispense: 4 pen; Refill: 2  7. B12 deficiency  Continue B12 supplementation   8. Breast cancer screening  - MM Digital Screening; Future  9. Chronic pain of left knee  Discussed X-ray, advised to hold off on NSAID's and try Tylenol arthritis otc

## 2017-02-13 ENCOUNTER — Telehealth (HOSPITAL_COMMUNITY): Payer: Self-pay

## 2017-02-13 NOTE — Telephone Encounter (Signed)
Called patient to get Ms. Wendy [name] scheduled for an appt. (referred to our our office), no answer left voicemail instructing patient to return my call

## 2017-02-18 ENCOUNTER — Other Ambulatory Visit: Payer: Self-pay | Admitting: Family Medicine

## 2017-02-18 DIAGNOSIS — N951 Menopausal and female climacteric states: Secondary | ICD-10-CM

## 2017-02-28 ENCOUNTER — Telehealth (HOSPITAL_COMMUNITY): Payer: Self-pay

## 2017-02-28 ENCOUNTER — Encounter: Payer: 59 | Admitting: Podiatry

## 2017-02-28 NOTE — Telephone Encounter (Signed)
Received a referral on this patient, called to get her scheduled for an appointment.No answer left voicemail, instructing patient to return my call at the office

## 2017-03-04 NOTE — Progress Notes (Signed)
This encounter was created in error - please disregard.

## 2017-03-06 ENCOUNTER — Telehealth (HOSPITAL_COMMUNITY): Payer: Self-pay

## 2017-03-06 NOTE — Telephone Encounter (Signed)
Received a referral on this patient, called to schedule an appointment, no answer, left voicemail instructing patient to return my call at the office.

## 2017-03-14 ENCOUNTER — Other Ambulatory Visit: Payer: Self-pay | Admitting: Family Medicine

## 2017-03-14 DIAGNOSIS — E1121 Type 2 diabetes mellitus with diabetic nephropathy: Secondary | ICD-10-CM

## 2017-03-14 DIAGNOSIS — E113299 Type 2 diabetes mellitus with mild nonproliferative diabetic retinopathy without macular edema, unspecified eye: Secondary | ICD-10-CM

## 2017-03-14 DIAGNOSIS — E1165 Type 2 diabetes mellitus with hyperglycemia: Secondary | ICD-10-CM

## 2017-03-14 DIAGNOSIS — IMO0002 Reserved for concepts with insufficient information to code with codable children: Secondary | ICD-10-CM

## 2017-05-11 ENCOUNTER — Ambulatory Visit: Payer: 59 | Admitting: Family Medicine

## 2017-05-25 ENCOUNTER — Encounter: Payer: Self-pay | Admitting: Family Medicine

## 2017-05-25 ENCOUNTER — Ambulatory Visit: Payer: 59 | Admitting: Family Medicine

## 2017-05-25 VITALS — BP 160/90 | HR 80 | Resp 14 | Ht 64.0 in | Wt 254.9 lb

## 2017-05-25 DIAGNOSIS — E113299 Type 2 diabetes mellitus with mild nonproliferative diabetic retinopathy without macular edema, unspecified eye: Secondary | ICD-10-CM

## 2017-05-25 DIAGNOSIS — E1165 Type 2 diabetes mellitus with hyperglycemia: Secondary | ICD-10-CM

## 2017-05-25 DIAGNOSIS — E538 Deficiency of other specified B group vitamins: Secondary | ICD-10-CM | POA: Diagnosis not present

## 2017-05-25 DIAGNOSIS — E559 Vitamin D deficiency, unspecified: Secondary | ICD-10-CM | POA: Diagnosis not present

## 2017-05-25 DIAGNOSIS — I10 Essential (primary) hypertension: Secondary | ICD-10-CM | POA: Diagnosis not present

## 2017-05-25 DIAGNOSIS — E668 Other obesity: Secondary | ICD-10-CM | POA: Diagnosis not present

## 2017-05-25 DIAGNOSIS — E785 Hyperlipidemia, unspecified: Secondary | ICD-10-CM | POA: Diagnosis not present

## 2017-05-25 DIAGNOSIS — IMO0002 Reserved for concepts with insufficient information to code with codable children: Secondary | ICD-10-CM

## 2017-05-25 MED ORDER — AMLODIPINE BESYLATE 5 MG PO TABS: 5.0000 mg | ORAL_TABLET | Freq: Every day | ORAL | 0 refills | Status: DC

## 2017-05-25 MED ORDER — EMPAGLIFLOZIN-METFORMIN HCL ER 25-1000 MG PO TB24: 1.0000 | ORAL_TABLET | Freq: Every day | ORAL | 5 refills | Status: DC

## 2017-05-25 MED ORDER — SEMAGLUTIDE(0.25 OR 0.5MG/DOS) 2 MG/1.5ML ~~LOC~~ SOPN: 0.5000 mg | PEN_INJECTOR | SUBCUTANEOUS | 2 refills | Status: DC

## 2017-05-25 MED ORDER — TELMISARTAN-HCTZ 80-25 MG PO TABS: ORAL_TABLET | ORAL | 1 refills | Status: DC

## 2017-05-25 MED ORDER — ATORVASTATIN CALCIUM 40 MG PO TABS: 40.0000 mg | ORAL_TABLET | Freq: Every evening | ORAL | 1 refills | Status: DC

## 2017-05-25 NOTE — Progress Notes (Signed)
Name: Wendy [name]   MRN: [id number]    DOB: 21-Feb-1961   Date:05/25/2017       Progress Note  Subjective  Chief Complaint  Chief Complaint  Patient presents with  . Diabetes  . Hypertension    HPI  DM with nephropathy, retinopathy: she was  taking Trulicity again,  Xigduo, could not tolerate two daily, hgbA1C went up from 7.1% to 7.7% , and was down to 7.5%., however she had to go back to previous xigduo and states also ran out of Trulicity because of cost, possibly because of high deductible plan. We will try changing to [place], we will give her vouchers. Explained that she needs to bring all her medications with her on her next visit. She is due for eye exam on [facility].  She refuses to see dietician, she states she is avoid sodas and is eating better. She states her glucose went up to 300 on Christmas - she ate cake.   HTN: taking medications as indicated, bp has been elevated since last visit, we will add Norvasc and return in 3 months for follow up  Hyperlipidemia: she states she is back on Atorvastatin, no chest pain.   Snoring: she did not go to sleep study, and she did not re-schedule it. She always feels tired. Explained importance of sleep study. There is an increase risk of heart attacks and strokes in patient with sleep apnea that are not treated. She continues to refuse it at this time  B12 and vitamin D deficiency: she is taking supplements as prescribed   Extremity obesity: weight is stable since last visit.    Patient Active Problem List   Diagnosis Date Noted  . Mild nonproliferative diabetic retinopathy of both eyes associated with type 2 diabetes mellitus ([place]) 06/09/2016  . Uncontrolled type 2 diabetes mellitus with nonproliferative retinopathy ([place]) 03/01/2016  . Vitamin D deficiency 07/07/2015  . Vitamin B12 deficiency 07/07/2015  . Snoring 12/25/2014  . Allergic rhinitis, seasonal 12/14/2014  . Chronic constipation 12/14/2014  . Diabetes mellitus  with renal manifestation ([place]) 12/14/2014  . Dyslipidemia 12/14/2014  . Dermatitis, eczematoid 12/14/2014  . Edema extremities 12/14/2014  . Essential (primary) hypertension 12/14/2014  . Gastro-esophageal reflux disease without esophagitis 12/14/2014  . Genital herpes in women 12/14/2014  . Extreme obesity 12/14/2014  . Female climacteric state 12/14/2014    Past Surgical History:  Procedure Laterality Date  . CESAREAN SECTION  1993  . tonsillectomy and adnoidectomy    . WISDOM TOOTH EXTRACTION      Family History  Problem Relation Age of Onset  . Diabetes Mother   . Asthma Mother   . Heart disease Mother   . Heart disease Father   . Prostate cancer Father   . Other Father        blockages in stomach  . Kidney disease Paternal Grandmother   . Asthma Son   . Autism Son   . Breast cancer Neg Hx   . Colon cancer Neg Hx   . Esophageal cancer Neg Hx   . Pancreatic cancer Neg Hx   . Rectal cancer Neg Hx   . Stomach cancer Neg Hx     Social History   Socioeconomic History  . Marital status: Married    Spouse name: Not on file  . Number of children: 1  . Years of education: Not on file  . Highest education level: Not on file  Social Needs  . Financial resource strain: Not on file  .  Food insecurity - worry: Not on file  . Food insecurity - inability: Not on file  . Transportation needs - medical: Not on file  . Transportation needs - non-medical: Not on file  Occupational History  . Not on file  Tobacco Use  . Smoking status: Former Smoker    Years: 20.00    Types: Cigarettes    Start date: 04/19/1971    Last attempt to quit: 04/19/1991    Years since quitting: 26.1  . Smokeless tobacco: Never Used  Substance and Sexual Activity  . Alcohol use: No    Alcohol/week: 0.0 oz  . Drug use: No  . Sexual activity: Yes    Partners: Male    Birth control/protection: Post-menopausal  Other Topics Concern  . Not on file  Social History Narrative   She lost her job Dec  2017 , worked for 36 years at the same company .    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  amLODipine (NORVASC) 5 MG tablet, Take 1 tablet (5 mg total) by mouth daily., Disp: 90 tablet, Rfl: 0 .  aspirin (ASPIRIN LOW DOSE) 81 MG chewable tablet, Chew 1 tablet by mouth daily., Disp: , Rfl:  .  atorvastatin (LIPITOR) 40 MG tablet, Take 1 tablet (40 mg total) by mouth every evening., Disp: 90 tablet, Rfl: 1 .  B-12, Methylcobalamin, 1000 MCG SUBL, Place 1 tablet under the tongue daily., Disp: 30 tablet, Rfl: 0 .  Blood Glucose Monitoring Suppl (ONE TOUCH ULTRA 2) w/Device KIT, See admin instructions., Disp: , Rfl: 0 .  Cholecalciferol (VITAMIN D) 2000 units CAPS, Take 1 capsule (2,000 Units total) by mouth daily., Disp: 30 capsule, Rfl: 0 .  Empagliflozin-Metformin HCl ER (SYNJARDY XR) 25-1000 MG TB24, Take 1 tablet by mouth daily., Disp: 30 tablet, Rfl: 5 .  fluticasone (FLONASE) 50 MCG/ACT nasal spray, Place 2 sprays into both nostrils daily., Disp: 16 g, Rfl: 6 .  linaclotide (LINZESS) 145 MCG CAPS capsule, Take 1 capsule (145 mcg total) by mouth daily before breakfast., Disp: 30 capsule, Rfl: 11 .  loratadine (CLARITIN) 10 MG tablet, TAKE 1 TABLET (10 MG TOTAL) BY MOUTH DAILY., Disp: 30 tablet, Rfl: 2 .  omeprazole (PRILOSEC) 40 MG capsule, Take 1 capsule (40 mg total) by mouth daily., Disp: 30 capsule, Rfl: 0 .  ONE TOUCH ULTRA TEST test strip, CHECK FASTING BLOOD SUGARS TWICE DAILY, Disp: 100 each, Rfl: 2 .  ONETOUCH DELICA LANCETS FINE MISC, See admin instructions., Disp: , Rfl: 0 .  Polyethylene Glycol 3350 POWD, Take by mouth., Disp: , Rfl:  .  Semaglutide (OZEMPIC) 0.25 or 0.5 MG/DOSE SOPN, Inject 0.5 mg into the skin once a week., Disp: 3 mL, Rfl: 2 .  telmisartan-hydrochlorothiazide (MICARDIS HCT) 80-25 MG tablet, TAKE 1  TABLET BY MOUTH DAILY. *INS ONLY PAYS FOR 30 DAYS*, Disp: 90 tablet, Rfl: 1 .  valACYclovir (VALTREX) 500 MG tablet, Take 1 tablet (500 mg total) by mouth 3 (three) times daily., Disp: 90 tablet, Rfl: 1 .  venlafaxine XR (EFFEXOR-XR) 75 MG 24 hr capsule, TAKE ONE CAPSULE BY MOUTH EVERY DAY, Disp: 90 capsule, Rfl: 1  Allergies  Allergen Reactions  . Penicillins Swelling     ROS  Constitutional: Negative for fever or weight change.  Respiratory: Negative for cough and  shortness of breath.   Cardiovascular: Negative for chest pain or palpitations.  Gastrointestinal: Negative for abdominal pain, no bowel changes.  Musculoskeletal: Negative for gait problem or joint swelling.  Skin: Negative for rash.  Neurological: Negative for dizziness or headache.  No other specific complaints in a complete review of systems (except as listed in HPI above).  Objective  Vitals:   05/25/17 1041  BP: (!) 160/90  Pulse: 80  Resp: 14  Weight: 254 lb 14.4 oz (115.6 kg)  Height: _0  (1.626 m)    Body mass index is 43.75 kg/m.  Physical Exam  Constitutional: Patient appears well-developed and well-nourished. Obese  No distress.  HEENT: head atraumatic, normocephalic, pupils equal and reactive to light,  neck supple, throat within normal limits Cardiovascular: Normal rate, regular rhythm and normal heart sounds.  No murmur heard. No BLE edema. Pulmonary/Chest: Effort normal and breath sounds normal. No respiratory distress. Abdominal: Soft.  There is no tenderness. Psychiatric: Patient has a normal mood and affect. behavior is normal. Judgment and thought content normal.  PHQ2/9: Depression screen [facility] 2/9 12/15/2016 08/25/2016 03/25/2016 12/09/2015 07/07/2015  Decreased Interest 0 0 0 0 0  Down, Depressed, Hopeless 0 0 0 0 0  PHQ - 2 Score 0 0 0 0 0     Fall Risk: Fall Risk  05/25/2017 02/09/2017 12/15/2016 08/25/2016 03/25/2016  Falls in the past year? _1     Functional Status  Survey: Is the patient deaf or have difficulty hearing?: No Does the patient have difficulty seeing, even when wearing glasses/contacts?: No Does the patient have difficulty concentrating, remembering, or making decisions?: No Does the patient have difficulty walking or climbing stairs?: No Does the patient have difficulty dressing or bathing?: No Does the patient have difficulty doing errands alone such as visiting a doctor's office or shopping?: No   Assessment & Plan  1. Uncontrolled type 2 diabetes mellitus with nonproliferative retinopathy (HCC)  - Semaglutide (OZEMPIC) 0.25 or 0.5 MG/DOSE SOPN; Inject 0.5 mg into the skin once a week.  Dispense: 3 mL; Refill: 2 - Empagliflozin-Metformin HCl ER (SYNJARDY XR) 25-1000 MG TB24; Take 1 tablet by mouth daily.  Dispense: 30 tablet; Refill: 5 - Hemoglobin A1c  2. Dyslipidemia  - atorvastatin (LIPITOR) 40 MG tablet; Take 1 tablet (40 mg total) by mouth every evening.  Dispense: 90 tablet; Refill: 1 - Lipid panel  3. Extreme obesity  Discussed with the patient the risk posed by an increased BMI. Discussed importance of portion control, calorie counting and at least 150 minutes of physical activity weekly. Avoid sweet beverages and drink more water. Eat at least 6 servings of fruit and vegetables daily   4. Essential (primary) hypertension  bp is not at goal, we will add norvasc 5 mg daily  - telmisartan-hydrochlorothiazide (MICARDIS HCT) 80-25 MG tablet; TAKE 1 TABLET BY MOUTH DAILY. *INS ONLY PAYS FOR 30 DAYS*  Dispense: 90 tablet; Refill: 1 - amLODipine (NORVASC) 5 MG tablet; Take 1 tablet (5 mg total) by mouth daily.  Dispense: 90 tablet; Refill: 0 - Comprehensive metabolic panel  5. B12 deficiency  - Vitamin B12  6. Vitamin D deficiency  - VITAMIN D 25 Hydroxy (Vit-D Deficiency, Fractures)

## 2017-05-25 NOTE — Patient Instructions (Signed)
Use Ozempic 0.5 in place of Trulicity Sinjady in place of Xigduo  New bp medication is Norvasc

## 2017-05-26 LAB — LIPID PANEL
Cholesterol: 146 mg/dL (ref <200)
HDL: 62 mg/dL (ref >50)
LDL Cholesterol (Calc): 68 mg/dL (calc)
Non-HDL Cholesterol (Calc): 84 mg/dL (calc) (ref <130)
TRIGLYCERIDES: 82 mg/dL (ref <150)
Total CHOL/HDL Ratio: 2.4 (calc) (ref <5.0)

## 2017-05-26 LAB — VITAMIN B12: VITAMIN B 12: 1061 pg/mL (ref 200–1100)

## 2017-05-26 LAB — COMPREHENSIVE METABOLIC PANEL
AG RATIO: 1.8 (calc) (ref 1.0–2.5)
ALT: 21 U/L (ref 6–29)
AST: 19 U/L (ref 10–35)
Albumin: 4.5 g/dL (ref 3.6–5.1)
Alkaline phosphatase (APISO): 145 U/L — ABNORMAL HIGH (ref 33–130)
BILIRUBIN TOTAL: 0.5 mg/dL (ref 0.2–1.2)
BUN: 12 mg/dL (ref 7–25)
CALCIUM: 10.1 mg/dL (ref 8.6–10.4)
CO2: 30 mmol/L (ref 20–32)
Chloride: 104 mmol/L (ref 98–110)
Creat: 0.69 mg/dL (ref 0.50–1.05)
Globulin: 2.5 g/dL (calc) (ref 1.9–3.7)
Glucose, Bld: 121 mg/dL (ref 65–139)
Potassium: 3.5 mmol/L (ref 3.5–5.3)
Sodium: 143 mmol/L (ref 135–146)
Total Protein: 7 g/dL (ref 6.1–8.1)

## 2017-05-26 LAB — VITAMIN D 25 HYDROXY (VIT D DEFICIENCY, FRACTURES): Vit D, 25-Hydroxy: 33 ng/mL (ref 30–100)

## 2017-05-26 LAB — HEMOGLOBIN A1C
EAG (MMOL/L): 10.3 (calc)
Hgb A1c MFr Bld: 8.1 % of total Hgb — ABNORMAL HIGH (ref <5.7)
Mean Plasma Glucose: 186 (calc)

## 2017-06-13 ENCOUNTER — Telehealth: Payer: Self-pay | Admitting: Family Medicine

## 2017-06-13 NOTE — Telephone Encounter (Signed)
Copied from [place]. Topic: Quick Communication - See Telephone Encounter >> Jun 13, 2017  4:26 PM Conception Chancy, NT wrote: CRM for notification. See Telephone encounter for:  06/13/17.  [name] states Wendy [name] (diabetic medication) was supposed to be called into her pharmacy and she is being told they do not have it. Please advise.   [id number] [place], [place] - [address]

## 2017-06-13 NOTE — Telephone Encounter (Signed)
[facility] [place] and pharmacist states they never received it even though it went through on our side. Informed patient it was called in and will be getting ready for [facility].

## 2017-08-30 ENCOUNTER — Ambulatory Visit: Payer: 59 | Admitting: Family Medicine

## 2017-11-06 ENCOUNTER — Encounter: Payer: Self-pay | Admitting: Family Medicine

## 2017-11-06 ENCOUNTER — Ambulatory Visit (INDEPENDENT_AMBULATORY_CARE_PROVIDER_SITE_OTHER): Payer: 59 | Admitting: Family Medicine

## 2017-11-06 VITALS — BP 136/72 | HR 105 | Temp 97.8°F | Resp 16 | Ht 64.0 in | Wt 250.0 lb

## 2017-11-06 DIAGNOSIS — E1165 Type 2 diabetes mellitus with hyperglycemia: Secondary | ICD-10-CM | POA: Diagnosis not present

## 2017-11-06 DIAGNOSIS — Z599 Problem related to housing and economic circumstances, unspecified: Secondary | ICD-10-CM

## 2017-11-06 DIAGNOSIS — E785 Hyperlipidemia, unspecified: Secondary | ICD-10-CM

## 2017-11-06 DIAGNOSIS — E559 Vitamin D deficiency, unspecified: Secondary | ICD-10-CM

## 2017-11-06 DIAGNOSIS — Z91199 Patient's noncompliance with other medical treatment and regimen due to unspecified reason: Secondary | ICD-10-CM

## 2017-11-06 DIAGNOSIS — Z9119 Patient's noncompliance with other medical treatment and regimen: Secondary | ICD-10-CM

## 2017-11-06 DIAGNOSIS — E538 Deficiency of other specified B group vitamins: Secondary | ICD-10-CM

## 2017-11-06 DIAGNOSIS — E1121 Type 2 diabetes mellitus with diabetic nephropathy: Secondary | ICD-10-CM

## 2017-11-06 DIAGNOSIS — IMO0002 Reserved for concepts with insufficient information to code with codable children: Secondary | ICD-10-CM

## 2017-11-06 DIAGNOSIS — I10 Essential (primary) hypertension: Secondary | ICD-10-CM

## 2017-11-06 DIAGNOSIS — Z598 Other problems related to housing and economic circumstances: Secondary | ICD-10-CM

## 2017-11-06 DIAGNOSIS — F411 Generalized anxiety disorder: Secondary | ICD-10-CM

## 2017-11-06 DIAGNOSIS — E113299 Type 2 diabetes mellitus with mild nonproliferative diabetic retinopathy without macular edema, unspecified eye: Secondary | ICD-10-CM

## 2017-11-06 DIAGNOSIS — N941 Unspecified dyspareunia: Secondary | ICD-10-CM

## 2017-11-06 DIAGNOSIS — N950 Postmenopausal bleeding: Secondary | ICD-10-CM

## 2017-11-06 DIAGNOSIS — N951 Menopausal and female climacteric states: Secondary | ICD-10-CM

## 2017-11-06 LAB — POCT UA - MICROALBUMIN: MICROALBUMIN (UR) POC: 20 mg/L

## 2017-11-06 LAB — POCT GLYCOSYLATED HEMOGLOBIN (HGB A1C): HBA1C, POC (CONTROLLED DIABETIC RANGE): 9.7 % — AB (ref 0.0–7.0)

## 2017-11-06 MED ORDER — ATORVASTATIN CALCIUM 40 MG PO TABS: 40.0000 mg | ORAL_TABLET | Freq: Every evening | ORAL | 1 refills | Status: DC

## 2017-11-06 MED ORDER — VENLAFAXINE HCL ER 75 MG PO CP24: 150.0000 mg | ORAL_CAPSULE | Freq: Every day | ORAL | 1 refills | Status: DC

## 2017-11-06 MED ORDER — INSULIN DEGLUDEC-LIRAGLUTIDE 100-3.6 UNIT-MG/ML ~~LOC~~ SOPN: 16.0000 [IU] | PEN_INJECTOR | Freq: Every day | SUBCUTANEOUS | 0 refills | Status: DC

## 2017-11-06 MED ORDER — EMPAGLIFLOZIN-METFORMIN HCL ER 25-1000 MG PO TB24: 1.0000 | ORAL_TABLET | Freq: Every day | ORAL | 5 refills | Status: DC

## 2017-11-06 MED ORDER — TELMISARTAN-HCTZ 80-25 MG PO TABS: ORAL_TABLET | ORAL | 1 refills | Status: DC

## 2017-11-06 MED ORDER — INSULIN PEN NEEDLE 32G X 6 MM MISC: 1.0000 | Freq: Every day | 2 refills | Status: DC

## 2017-11-06 NOTE — Progress Notes (Signed)
Name: Wendy [name]   MRN: [id number]    DOB: 01-Oct-1960   Date:11/06/2017       Progress Note  Subjective  Chief Complaint  Chief Complaint  Patient presents with  . Medication Refill    4 month F/U  . Diabetes  . Hypertension    Edema in feet   . Hyperlipidemia  . Extremely Obesity    HPI  DM with nephropathy, retinopathy: she is supposed to be on Ozempic and Synjardi once a day. Her hgbA1C went up from 7.1% to 7.7%, and was down to 7.5%., up to 8.1 % and now 9.7%She has been out of Ozempic for about 3 months because co-pay went up to $100 and cannot afford it. Advised her to contact us when out of medication for any reason. We will try switching to [facility] and place a referral to [facility]. She refuses to see dietician, explained importance of compliance with follow ups, diet and also medication to get glucose at goal   HTN: today she states only taking Telmisartan. HCTZ and bp is at goal. She is not sure if taking norvasc No chest pain or palpitation. We will remove norvasc from her list, since bp is at goal and patient has not been taking medication   Hyperlipidemia: she states she is back on Atorvastatin, no chest pain. Last lipid panel was at goal.   Snoring: she did not go to sleep study, and she did not re-schedule it. She always feels tired. Explained importance of sleep study. There is an increase risk of heart attacks and strokes in patient with sleep apnea that are not treated. She continues to refuse it. Unchanged   B12 and vitamin D deficiency: she is taking supplements as prescribed. Unchanged   Extremity obesity:she lost some weight since last visit, but may be from uncontrolled diabetes, discussed life style modification   Post-menopausal bleeding : she was menopausal, lost follow up with gyn, still has cramping and intermittent bleeding, explained importance of follow up with gyn to rule out endometrium cancer.   Hot flashes/GAD : she has episodes of panic attacks,  phq9 is normal. She states panic attacks can be during the night,we will adjust dose of effexor from 75 to 150 mg daily   Patient Active Problem List   Diagnosis Date Noted  . Mild nonproliferative diabetic retinopathy of both eyes associated with type 2 diabetes mellitus ([place]) 06/09/2016  . Uncontrolled type 2 diabetes mellitus with nonproliferative retinopathy ([place]) 03/01/2016  . Vitamin D deficiency 07/07/2015  . Vitamin B12 deficiency 07/07/2015  . Snoring 12/25/2014  . Allergic rhinitis, seasonal 12/14/2014  . Chronic constipation 12/14/2014  . Diabetes mellitus with renal manifestation ([place]) 12/14/2014  . Dyslipidemia 12/14/2014  . Dermatitis, eczematoid 12/14/2014  . Edema extremities 12/14/2014  . Essential (primary) hypertension 12/14/2014  . Gastro-esophageal reflux disease without esophagitis 12/14/2014  . Genital herpes in women 12/14/2014  . Extreme obesity 12/14/2014  . Female climacteric state 12/14/2014    Past Surgical History:  Procedure Laterality Date  . CESAREAN SECTION  1993  . tonsillectomy and adnoidectomy    . WISDOM TOOTH EXTRACTION      Family History  Problem Relation Age of Onset  . Diabetes Mother   . Asthma Mother   . Heart disease Mother   . Heart disease Father   . Prostate cancer Father   . Other Father        blockages in stomach  . Kidney disease Paternal Grandmother   . Asthma  Son   . Autism Son   . Breast cancer Neg Hx   . Colon cancer Neg Hx   . Esophageal cancer Neg Hx   . Pancreatic cancer Neg Hx   . Rectal cancer Neg Hx   . Stomach cancer Neg Hx     Social History   Socioeconomic History  . Marital status: Married    Spouse name: Not on file  . Number of children: 1  . Years of education: Not on file  . Highest education level: Not on file  Occupational History  . Not on file  Social Needs  . Financial resource strain: Not on file  . Food insecurity:    Worry: Not on file    Inability: Not on file  .  Transportation needs:    Medical: Not on file    Non-medical: Not on file  Tobacco Use  . Smoking status: Former Smoker    Years: 20.00    Types: Cigarettes    Start date: 04/19/1971    Last attempt to quit: 04/19/1991    Years since quitting: 26.5  . Smokeless tobacco: Never Used  Substance and Sexual Activity  . Alcohol use: No    Alcohol/week: 0.0 oz  . Drug use: No  . Sexual activity: Yes    Partners: Male    Birth control/protection: Post-menopausal  Lifestyle  . Physical activity:    Days per week: Not on file    Minutes per session: Not on file  . Stress: Not on file  Relationships  . Social connections:    Talks on phone: Not on file    Gets together: Not on file    Attends religious service: Not on file    Active member of club or organization: Not on file    Attends meetings of clubs or organizations: Not on file    Relationship status: Not on file  . Intimate partner violence:    Fear of current or ex partner: Not on file    Emotionally abused: Not on file    Physically abused: Not on file    Forced sexual activity: Not on file  Other Topics Concern  . Not on file  Social History Narrative   She lost her job Dec 2017 , worked for 36 years at the same company .    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  aspirin (ASPIRIN LOW DOSE) 81 MG chewable tablet, Chew 1 tablet by mouth daily., Disp: , Rfl:  .  atorvastatin (LIPITOR) 40 MG tablet, Take 1 tablet (40 mg total) by mouth every evening., Disp: 90 tablet, Rfl: 1 .  Blood Glucose Monitoring Suppl (ONE TOUCH ULTRA 2) w/Device KIT, See admin instructions., Disp: , Rfl: 0 .  Cholecalciferol (VITAMIN D) 2000 units CAPS, Take 1 capsule (2,000 Units total) by mouth daily., Disp: 30 capsule, Rfl: 0 .  Empagliflozin-metFORMIN HCl ER (SYNJARDY XR) 25-1000 MG  TB24, Take 1 tablet by mouth daily., Disp: 30 tablet, Rfl: 5 .  ONE TOUCH ULTRA TEST test strip, CHECK FASTING BLOOD SUGARS TWICE DAILY, Disp: 100 each, Rfl: 2 .  ONETOUCH DELICA LANCETS FINE MISC, See admin instructions., Disp: , Rfl: 0 .  telmisartan-hydrochlorothiazide (MICARDIS HCT) 80-25 MG tablet, TAKE 1 TABLET BY MOUTH DAILY. *INS ONLY PAYS FOR 30  DAYS*, Disp: 90 tablet, Rfl: 1 .  valACYclovir (VALTREX) 500 MG tablet, Take 1 tablet (500 mg total) by mouth 3 (three) times daily., Disp: 90 tablet, Rfl: 1 .  venlafaxine XR (EFFEXOR-XR) 75 MG 24 hr capsule, Take 2 capsules (150 mg total) by mouth daily., Disp: 90 capsule, Rfl: 1 .  amLODipine (NORVASC) 5 MG tablet, Take 1 tablet (5 mg total) by mouth daily. (Patient not taking: Reported on 11/06/2017), Disp: 90 tablet, Rfl: 0 .  B-12, Methylcobalamin, 1000 MCG SUBL, Place 1 tablet under the tongue daily. (Patient not taking: Reported on 11/06/2017), Disp: 30 tablet, Rfl: 0 .  fluticasone (FLONASE) 50 MCG/ACT nasal spray, Place 2 sprays into both nostrils daily. (Patient not taking: Reported on 11/06/2017), Disp: 16 g, Rfl: 6 .  Insulin Degludec-Liraglutide (XULTOPHY) 100-3.6 UNIT-MG/ML SOPN, Inject 16-50 Units into the skin daily., Disp: 9 mL, Rfl: 0 .  Insulin Pen Needle (NOVOFINE) 32G X 6 MM MISC, 1 each by Does not apply route daily., Disp: 100 each, Rfl: 2 .  Polyethylene Glycol 3350 POWD, Take by mouth., Disp: , Rfl:   Allergies  Allergen Reactions  . Penicillins Swelling     ROS  Constitutional: Negative for fever, positive for weight change.  Respiratory: Negative for cough and shortness of breath.   Cardiovascular: Negative for chest pain or palpitations.  Gastrointestinal: Negative for abdominal pain, no bowel changes.  Musculoskeletal: Negative for gait problem or joint swelling.  Skin: Negative for rash.  Neurological: Negative for dizziness or headache.  No other specific complaints in a complete review of systems (except as  listed in HPI above).  Objective  Vitals:   11/06/17 1202  BP: 136/72  Pulse: (!) 105  Resp: 16  Temp: 97.8 F (36.6 C)  TempSrc: Oral  SpO2: 98%  Weight: 250 lb (113.4 kg)  Height: 5' 4"  (1.626 m)    Body mass index is 42.91 kg/m.  Physical Exam  Constitutional: Patient appears well-developed and well-nourished. Obese  No distress.  HEENT: head atraumatic, normocephalic, pupils equal and reactive to light,  neck supple, throat within normal limits Cardiovascular: Normal rate, regular rhythm and normal heart sounds.  No murmur heard. No BLE edema. Pulmonary/Chest: Effort normal and breath sounds normal. No respiratory distress. Abdominal: Soft.  There is no tenderness. Psychiatric: Patient has a normal mood and affect. behavior is normal. Judgment and thought content normal.  Recent Results (from the past 2160 hour(s))  POCT HgB A1C     Status: Abnormal   Collection Time: 11/06/17 12:15 PM  Result Value Ref Range   Hemoglobin A1C  4.0 - 5.6 %   HbA1c POC (<> result, manual entry)  4.0 - 5.6 %   HbA1c, POC (prediabetic range)  5.7 - 6.4 %   HbA1c, POC (controlled diabetic range) 9.7 (A) 0.0 - 7.0 %  POCT UA - Microalbumin     Status: Normal   Collection Time: 11/06/17 12:18 PM  Result Value Ref Range   Microalbumin Ur, POC 20 mg/L   Creatinine, POC  mg/dL   Albumin/Creatinine Ratio, Urine, POC      Diabetic Foot Exam: Diabetic Foot Exam - Simple   Simple Foot Form Diabetic Foot exam was performed with the following findings:  Yes 11/06/2017 12:39 PM  Visual Inspection No deformities, no ulcerations, no other skin breakdown bilaterally:  Yes Sensation Testing Intact to touch and monofilament testing bilaterally:  Yes Pulse Check Posterior Tibialis and Dorsalis pulse intact bilaterally:  Yes Comments  PHQ2/9: Depression screen [facility] 2/9 11/06/2017 12/15/2016 08/25/2016 03/25/2016 12/09/2015  Decreased Interest 1 0 0 0 0  Down, Depressed, Hopeless 1 0 0 0 0   PHQ - 2 Score 2 0 0 0 0  Altered sleeping 0 - - - -  Tired, decreased energy 0 - - - -  Change in appetite 0 - - - -  Feeling bad or failure about yourself  0 - - - -  Trouble concentrating 0 - - - -  Moving slowly or fidgety/restless 0 - - - -  Suicidal thoughts 0 - - - -  PHQ-9 Score 2 - - - -  Difficult doing work/chores Somewhat difficult - - - -    Fall Risk: Fall Risk  11/06/2017 05/25/2017 02/09/2017 12/15/2016 08/25/2016  Falls in the past year? No No No No No    Functional Status Survey: Is the patient deaf or have difficulty hearing?: No Does the patient have difficulty seeing, even when wearing glasses/contacts?: No Does the patient have difficulty concentrating, remembering, or making decisions?: No Does the patient have difficulty walking or climbing stairs?: No Does the patient have difficulty dressing or bathing?: No Does the patient have difficulty doing errands alone such as visiting a doctor's office or shopping?: No   Assessment & Plan  1. Uncontrolled type 2 diabetes mellitus with nonproliferative retinopathy (HCC)  - POCT HgB A1C - POCT UA - Microalbumin - Insulin Degludec-Liraglutide (XULTOPHY) 100-3.6 UNIT-MG/ML SOPN; Inject 16-50 Units into the skin daily.  Dispense: 9 mL; Refill: 0 - Empagliflozin-metFORMIN HCl ER (SYNJARDY XR) 25-1000 MG TB24; Take 1 tablet by mouth daily.  Dispense: 30 tablet; Refill: 5 - Insulin Pen Needle (NOVOFINE) 32G X 6 MM MISC; 1 each by Does not apply route daily.  Dispense: 100 each; Refill: 2  2. Essential (primary) hypertension  - telmisartan-hydrochlorothiazide (MICARDIS HCT) 80-25 MG tablet; TAKE 1 TABLET BY MOUTH DAILY. Dispense: 90 tablet; Refill: 1  3. Dyslipidemia  - atorvastatin (LIPITOR) 40 MG tablet; Take 1 tablet (40 mg total) by mouth every evening.  Dispense: 90 tablet; Refill: 1  4. Type 2 diabetes with nephropathy (HCC)  - Insulin Degludec-Liraglutide (XULTOPHY) 100-3.6 UNIT-MG/ML SOPN; Inject 16-50 Units  into the skin daily.  Dispense: 9 mL; Refill: 0 - Insulin Pen Needle (NOVOFINE) 32G X 6 MM MISC; 1 each by Does not apply route daily.  Dispense: 100 each; Refill: 2  5. B12 deficiency  Continue otc supplementation   6. Vitamin D deficiency  Continue supplementation   7. Dyspareunia in female  Follow up with gyn   8. Morbid obesity ([place])  Discussed with the patient the risk posed by an increased BMI. Discussed importance of portion control, calorie counting and at least 150 minutes of physical activity weekly. Avoid sweet beverages and drink more water. Eat at least 6 servings of fruit and vegetables daily   9. Female climacteric state  - venlafaxine XR (EFFEXOR-XR) 75 MG 24 hr capsule; Take 2 capsules (150 mg total) by mouth daily.  Dispense: 90 capsule; Refill: 1  10. GAD (generalized anxiety disorder)  - venlafaxine XR (EFFEXOR-XR) 75 MG 24 hr capsule; Take 2 capsules (150 mg total) by mouth daily.  Dispense: 90 capsule; Refill: 1  11. Post-menopausal bleeding  - Ambulatory referral to Obstetrics / Gynecology  12. Financial difficulty  - Ambulatory referral to [facility]  13. Medical non-compliance  - Ambulatory referral to [facility]

## 2017-11-07 ENCOUNTER — Telehealth: Payer: Self-pay | Admitting: Obstetrics & Gynecology

## 2017-11-07 NOTE — Telephone Encounter (Signed)
[facility] referring for Post-menopausal bleeding. Called and left voicemail for patient to call back to be schedule

## 2017-11-08 ENCOUNTER — Other Ambulatory Visit: Payer: Self-pay | Admitting: Family Medicine

## 2017-11-08 DIAGNOSIS — N951 Menopausal and female climacteric states: Secondary | ICD-10-CM

## 2017-11-14 ENCOUNTER — Other Ambulatory Visit (HOSPITAL_COMMUNITY)
Admission: RE | Admit: 2017-11-14 | Discharge: 2017-11-14 | Disposition: A | Discharge status: Home / Self Care | Payer: 59 | Source: Ambulatory Visit | Attending: Obstetrics & Gynecology | Admitting: Obstetrics & Gynecology

## 2017-11-14 ENCOUNTER — Ambulatory Visit (INDEPENDENT_AMBULATORY_CARE_PROVIDER_SITE_OTHER): Payer: 59 | Admitting: Obstetrics & Gynecology

## 2017-11-14 ENCOUNTER — Encounter: Payer: Self-pay | Admitting: Obstetrics & Gynecology

## 2017-11-14 VITALS — BP 130/80 | Ht 64.0 in | Wt 248.0 lb

## 2017-11-14 DIAGNOSIS — N950 Postmenopausal bleeding: Secondary | ICD-10-CM | POA: Insufficient documentation

## 2017-11-14 DIAGNOSIS — N858 Other specified noninflammatory disorders of uterus: Secondary | ICD-10-CM | POA: Diagnosis not present

## 2017-11-14 NOTE — Progress Notes (Signed)
Postmenopausal Bleeding Patient complains of vaginal bleeding. She has been menopausal for 1 year.  Stopped menses for over a year but then bleeding resumed, irregular.  Also reports bloating.  Has minimal menopausal vasomotor sx's.  Pain w sex, also post coital bleeding at times. Currently on no HRT. No recent other meds/changes.  Diabetes.  Bleeding is described as less flow than a normal period and has occurred monthly as well as as irreg times. Other menopausal symptoms include: none. Workup to date: none.  PAP 2017 normal.  Menstrual History: OB History    Gravida  1   Para  1   Term  1   Preterm      AB      Living  1     SAB      TAB      Ectopic      Multiple      Live Births             Patient's last menstrual period was 01/26/2017 (approximate).   PMHx: She  has a past medical history of Acute eczema, Allergy, Anemia, Anxiety, Chronic constipation with overflow, Diabetes mellitus without complication ([place]), GERD (gastroesophageal reflux disease), Herpetic vesicle in vagina, Hyperlipidemia, Hypertension, Leg edema, Morbid obesity ([place]), Peri-menopause, and Snoring. Also,  has a past surgical history that includes Cesarean section (1993); tonsillectomy and adnoidectomy; and Wisdom tooth extraction., family history includes Asthma in her mother and son; Autism in her son; Diabetes in her mother; Heart disease in her father and mother; Kidney disease in her paternal grandmother; Other in her father; Prostate cancer in her father.,  reports that she quit smoking about 26 years ago. Her smoking use included cigarettes. She started smoking about 46 years ago. She quit after 20.00 years of use. She has never used smokeless tobacco. She reports that she does not drink alcohol or use drugs.  She has a current medication list which includes the following prescription(s): aspirin, atorvastatin, b-12 (methylcobalamin), one touch ultra 2, vitamin d, empagliflozin-metformin hcl er,  fluticasone, insulin degludec-liraglutide, insulin pen needle, one touch ultra test, onetouch delica lancets fine, polyethylene glycol 3350, telmisartan-hydrochlorothiazide, valacyclovir, and venlafaxine xr. Also, is allergic to penicillins.  Review of Systems  Constitutional: Positive for malaise/fatigue. Negative for chills and fever.  HENT: Negative for congestion, sinus pain and sore throat.   Eyes: Negative for blurred vision and pain.  Respiratory: Negative for cough and wheezing.   Cardiovascular: Negative for chest pain and leg swelling.  Gastrointestinal: Negative for abdominal pain, constipation, diarrhea, heartburn, nausea and vomiting.  Genitourinary: Negative for dysuria, frequency, hematuria and urgency.  Musculoskeletal: Negative for back pain, joint pain, myalgias and neck pain.  Skin: Negative for itching and rash.  Neurological: Negative for dizziness, tremors and weakness.  Endo/Heme/Allergies: Does not bruise/bleed easily.  Psychiatric/Behavioral: Positive for depression. The patient is nervous/anxious. The patient does not have insomnia.    Objective: BP 130/80   Ht 5\' 4"  (1.626 m)   Wt 248 lb (112.5 kg)   LMP 01/26/2017 (Approximate)   BMI 42.57 kg/m  Physical Exam  Constitutional: She is oriented to person, place, and time. She appears well-developed and well-nourished. No distress.  Genitourinary: Rectum normal, vagina normal and uterus normal. Pelvic exam was performed with patient supine. There is no rash or lesion on the right labia. There is no rash or lesion on the left labia. Vagina exhibits no lesion. No bleeding in the vagina. Right adnexum does not display mass and does not  display tenderness. Left adnexum does not display mass and does not display tenderness. Cervix does not exhibit motion tenderness, lesion, friability or polyp.   Uterus is mobile and midaxial. Uterus is not enlarged or exhibiting a mass.  HENT:  Head: Normocephalic and atraumatic. Head  is without laceration.  Right Ear: Hearing normal.  Left Ear: Hearing normal.  Nose: No epistaxis.  No foreign bodies.  Mouth/Throat: Uvula is midline, oropharynx is clear and moist and mucous membranes are normal.  Eyes: Pupils are equal, round, and reactive to light.  Neck: Normal range of motion. Neck supple. No thyromegaly present.  Cardiovascular: Normal rate and regular rhythm. Exam reveals no gallop and no friction rub.  No murmur heard. Pulmonary/Chest: Effort normal and breath sounds normal. No respiratory distress. She has no wheezes. Right breast exhibits no mass, no skin change and no tenderness. Left breast exhibits no mass, no skin change and no tenderness.  Abdominal: Soft. Bowel sounds are normal. She exhibits no distension. There is no tenderness. There is no rebound.  Musculoskeletal: Normal range of motion.  Neurological: She is alert and oriented to person, place, and time. No cranial nerve deficit.  Skin: Skin is warm and dry.  Psychiatric: She has a normal mood and affect. Judgment normal.  Vitals reviewed.  ASSESSMENT/PLAN:   Problem List Items Addressed This Visit      Other   Post-menopausal bleeding - Primary     Endometrial Biopsy After discussion with the patient regarding her abnormal uterine bleeding I recommended that she proceed with an endometrial biopsy for further diagnosis. The risks, benefits, alternatives, and indications for an endometrial biopsy were discussed with the patient in detail. She understood the risks including infection, bleeding, cervical laceration and uterine perforation.  Verbal consent was obtained.   PROCEDURE NOTE:  [place] endometrial biopsy was performed using aseptic technique with iodine preparation.  The uterus was sounded to a length of 7 cm.  Adequate sampling was obtained with minimal blood loss.  The patient tolerated the procedure well.  Disposition will be pending pathology.  Also, plan pelvic US w f/u for all results  w/in 2 weeks  Patient has abnormal uterine bleeding and is peri or post menopausal. She has a normal exam today, with no evidence of lesions.  Evaluation includes the following: exam, labs, and pelvic ultrasound to evaluate for any structural gynecologic abnormalities.  Patient to follow up after testing. Treatment options discussed (cancer, hyperplasia, normal results yet bleeding).  The pros and cons of each option discussed with patient.  Barnett Applebaum, MD, [facility], [facility] 11/14/2017  2:52 PM

## 2017-11-14 NOTE — Telephone Encounter (Signed)
Patient is schedule 11/14/17 with [facility]

## 2017-11-14 NOTE — Patient Instructions (Signed)

## 2017-11-17 NOTE — Progress Notes (Signed)
Left message to return call if any questions.

## 2017-11-17 NOTE — Progress Notes (Signed)
Let her know endometrial biopsy done was NEGATIVE and will discuss further after she has ultrasound done. Thx.

## 2017-11-20 ENCOUNTER — Telehealth: Payer: Self-pay

## 2017-11-20 ENCOUNTER — Ambulatory Visit: Payer: Self-pay | Admitting: *Deleted

## 2017-11-20 NOTE — Telephone Encounter (Signed)
No I would not be worried, should improve with time.

## 2017-11-20 NOTE — Telephone Encounter (Signed)
Pt called triage line stating she had endometrial bx done last Tuesday, still having some spotting as of today, should she be worried? Please advise as [facility] is out of the office ,

## 2017-11-20 NOTE — Telephone Encounter (Signed)
Pt called with severe abd pain that started on Saturday. She had diarrhea and then the pain started in the upper abd. Unable to eat or drink much. Denies fever, shortness of breath or chest pain,. She is still nauseated, vomited yesterday only.  Pt advised to go to the ED to be assessed, per protocol.  Pt voiced understanding and will be going to [place] that she sure have someone take her.   Reason for Disposition . Patient sounds very sick or weak to the triager  Answer Assessment - Initial Assessment Questions 1. LOCATION: "Where does it hurt?"      Upper abd 2. RADIATION: "Does the pain shoot anywhere else?" (e.g., chest, back)     no 3. ONSET: "When did the pain begin?" (e.g., minutes, hours or days ago)      Saturday 4. SUDDEN: "Gradual or sudden onset?"     sudden 5. PATTERN "Does the pain come and go, or is it constant?"    - If constant: "Is it getting better, staying the same, or worsening?"      (Note: Constant means the pain never goes away completely; most serious pain is constant and it progresses)     - If intermittent: "How long does it last?" "Do you have pain now?"     (Note: Intermittent means the pain goes away completely between bouts)     constant 6. SEVERITY: "How bad is the pain?"  (e.g., Scale 1-10; mild, moderate, or severe)    - MILD (1-3): doesn't interfere with normal activities, abdomen soft and not tender to touch     - MODERATE (4-7): interferes with normal activities or awakens from sleep, tender to touch     - SEVERE (8-10): excruciating pain, doubled over, unable to do any normal activities       Pain # 10 7. RECURRENT SYMPTOM: "Have you ever had this type of abdominal pain before?" If so, ask: "When was the last time?" and "What happened that time?"      no 8. AGGRAVATING FACTORS: "Does anything seem to cause this pain?" (e.g., foods, stress, alcohol)     no 9. CARDIAC SYMPTOMS: "Do you have any of the following symptoms: chest pain,  difficulty breathing, sweating, nausea?"     Nausea and vomiting yesterday, sweating 10. OTHER SYMPTOMS: "Do you have any other symptoms?" (e.g., fever, vomiting, diarrhea)       Diarrhea at the beginning of last week 11. PREGNANCY: "Is there any chance you are pregnant?" "When was your last menstrual period?"       Yes, LMP a few days ago  Protocols used: ABDOMINAL PAIN - UPPER-A-AH

## 2017-11-20 NOTE — Telephone Encounter (Signed)
Left message to let pt know not to worry call with any questions

## 2017-11-29 ENCOUNTER — Encounter: Payer: Self-pay | Admitting: Obstetrics & Gynecology

## 2017-11-29 ENCOUNTER — Ambulatory Visit (INDEPENDENT_AMBULATORY_CARE_PROVIDER_SITE_OTHER): Payer: 59 | Admitting: Obstetrics & Gynecology

## 2017-11-29 ENCOUNTER — Ambulatory Visit (INDEPENDENT_AMBULATORY_CARE_PROVIDER_SITE_OTHER): Payer: 59

## 2017-11-29 VITALS — BP 120/80 | Ht 64.0 in | Wt 249.0 lb

## 2017-11-29 DIAGNOSIS — N840 Polyp of corpus uteri: Secondary | ICD-10-CM

## 2017-11-29 DIAGNOSIS — N950 Postmenopausal bleeding: Secondary | ICD-10-CM

## 2017-11-29 DIAGNOSIS — D219 Benign neoplasm of connective and other soft tissue, unspecified: Secondary | ICD-10-CM | POA: Insufficient documentation

## 2017-11-29 DIAGNOSIS — R9389 Abnormal findings on diagnostic imaging of other specified body structures: Secondary | ICD-10-CM

## 2017-11-29 DIAGNOSIS — D259 Leiomyoma of uterus, unspecified: Secondary | ICD-10-CM | POA: Diagnosis not present

## 2017-11-29 NOTE — Progress Notes (Signed)
  HPI: Postmenopausal Bleeding Patient complains of vaginal bleeding. She still has it daily when w\iping, not on clothes or pad, esp since EMB done.  No pain.  Known h/o fibroids. EMB normal. Ultrasound demonstrates 3 fibroids (3 cm).  Endometrial thickening c/w polyp  PMHx: She  has a past medical history of Acute eczema, Allergy, Anemia, Anxiety, Chronic constipation with overflow, Diabetes mellitus without complication ([place]), GERD (gastroesophageal reflux disease), Herpetic vesicle in vagina, Hyperlipidemia, Hypertension, Leg edema, Morbid obesity ([place]), Peri-menopause, and Snoring. Also,  has a past surgical history that includes Cesarean section (1993); tonsillectomy and adnoidectomy; and Wisdom tooth extraction., family history includes Asthma in her mother and son; Autism in her son; Diabetes in her mother; Heart disease in her father and mother; Kidney disease in her paternal grandmother; Other in her father; Prostate cancer in her father.,  reports that she quit smoking about 26 years ago. Her smoking use included cigarettes. She started smoking about 46 years ago. She quit after 20.00 years of use. She has never used smokeless tobacco. She reports that she does not drink alcohol or use drugs.  She has a current medication list which includes the following prescription(s): aspirin, atorvastatin, b-12 (methylcobalamin), one touch ultra 2, vitamin d, empagliflozin-metformin hcl er, fluticasone, insulin degludec-liraglutide, insulin pen needle, one touch ultra test, onetouch delica lancets fine, polyethylene glycol 3350, telmisartan-hydrochlorothiazide, valacyclovir, and venlafaxine xr. Also, is allergic to penicillins.  Review of Systems  All other systems reviewed and are negative.  Objective: BP 120/80   Ht 5\' 4"  (1.626 m)   Wt 249 lb (112.9 kg)   LMP 01/26/2017 (Approximate)   BMI 42.74 kg/m   Physical examination Constitutional NAD, Conversant  Skin No rashes, lesions or  ulceration.   Extremities: Moves all appropriately.  Normal ROM for age. No lymphadenopathy.  Neuro: Grossly intact  Psych: Oriented to PPT.  Normal mood. Normal affect.   Assessment:  Post-menopausal bleeding  Endometrial polyp  Fibroid  Hysteroscopy D&C for polyps recommended if sx's persist.  Hysterectomy for fibroids but since they have been present for years and seem not to be growing and she is menopausal, then less likely option.  Pt prefers no surgery and will wait at this time.  A total of 15 minutes were spent face-to-face with the patient during this encounter and over half of that time dealt with counseling and coordination of care.  Barnett Applebaum, MD, [facility], [facility] 11/29/2017  3:38 PM

## 2017-12-27 ENCOUNTER — Ambulatory Visit: Payer: Self-pay | Admitting: *Deleted

## 2017-12-27 NOTE — Telephone Encounter (Signed)
Offering her a 2 pm appointment today

## 2017-12-27 NOTE — Telephone Encounter (Signed)
Pt called with having abd pain going on over a month. She states that this started after starting a medication for her diabetes, Xultophy. She is having abd pain, nausea and vomiting after she eats, burping, passing gas and indigestion. She has diarrhea at times. She refuses to see another provider but wants her pcp to give her a call back to see if she can cut back on her Xultophy.  Reason for Disposition . Caller has URGENT medication question about med that PCP prescribed and triager unable to answer question  Answer Assessment - Initial Assessment Questions 1. LOCATION: "Where does it hurt?"      Upper abd 2. RADIATION: "Does the pain shoot anywhere else?" (e.g., chest, back)     no 3. ONSET: "When did the pain begin?" (e.g., minutes, hours or days ago)      For a while 4. SUDDEN: "Gradual or sudden onset?"     Sudden onset 5. PATTERN "Does the pain come and go, or is it constant?"    - If constant: "Is it getting better, staying the same, or worsening?"      (Note: Constant means the pain never goes away completely; most serious pain is constant and it progresses)     - If intermittent: "How long does it last?" "Do you have pain now?"     (Note: Intermittent means the pain goes away completely between bouts)     Pain comes after eating esp fried foods 6. SEVERITY: "How bad is the pain?"  (e.g., Scale 1-10; mild, moderate, or severe)    - MILD (1-3): doesn't interfere with normal activities, abdomen soft and not tender to touch     - MODERATE (4-7): interferes with normal activities or awakens from sleep, tender to touch     - SEVERE (8-10): excruciating pain, doubled over, unable to do any normal activities       Pain #10 7. RECURRENT SYMPTOM: "Have you ever had this type of abdominal pain before?" If so, ask: "When was the last time?" and "What happened that time?"      no 8. AGGRAVATING FACTORS: "Does anything seem to cause this pain?" (e.g., foods, stress, alcohol)     Foods 9.  CARDIAC SYMPTOMS: "Do you have any of the following symptoms: chest pain, difficulty breathing, sweating, nausea?"     Nausea and vomiting, sometimes sweatng 10. OTHER SYMPTOMS: "Do you have any other symptoms?" (e.g., fever, vomiting, diarrhea)       Vomiting, diarrhea some times 11. PREGNANCY: "Is there any chance you are pregnant?" "When was your last menstrual period?"       Had cervical bx and has been having cramping and bleeding all the time. No periods  Answer Assessment - Initial Assessment Questions 1. SYMPTOMS: "Do you have any symptoms?"     Yes, nausea, vomiting, abd pain, gas, indigestion 2. SEVERITY: If symptoms are present, ask "Are they mild, moderate or severe?"     severe  Protocols used: MEDICATION QUESTION CALL-A-AH, ABDOMINAL PAIN - UPPER-A-AH

## 2017-12-29 ENCOUNTER — Ambulatory Visit (INDEPENDENT_AMBULATORY_CARE_PROVIDER_SITE_OTHER): Payer: 59 | Admitting: Family Medicine

## 2017-12-29 ENCOUNTER — Encounter: Payer: Self-pay | Admitting: Family Medicine

## 2017-12-29 VITALS — BP 132/76 | HR 91 | Temp 98.0°F | Ht 64.0 in | Wt 253.1 lb

## 2017-12-29 DIAGNOSIS — E113299 Type 2 diabetes mellitus with mild nonproliferative diabetic retinopathy without macular edema, unspecified eye: Secondary | ICD-10-CM

## 2017-12-29 DIAGNOSIS — R1013 Epigastric pain: Secondary | ICD-10-CM | POA: Diagnosis not present

## 2017-12-29 DIAGNOSIS — A6004 Herpesviral vulvovaginitis: Secondary | ICD-10-CM

## 2017-12-29 DIAGNOSIS — IMO0002 Reserved for concepts with insufficient information to code with codable children: Secondary | ICD-10-CM

## 2017-12-29 DIAGNOSIS — E1165 Type 2 diabetes mellitus with hyperglycemia: Secondary | ICD-10-CM | POA: Diagnosis not present

## 2017-12-29 MED ORDER — OMEPRAZOLE 40 MG PO CPDR: 40.0000 mg | DELAYED_RELEASE_CAPSULE | Freq: Every day | ORAL | 3 refills | Status: DC

## 2017-12-29 MED ORDER — VALACYCLOVIR HCL 500 MG PO TABS: 500.0000 mg | ORAL_TABLET | Freq: Three times a day (TID) | ORAL | 1 refills | Status: DC

## 2017-12-29 MED ORDER — INSULIN DEGLUDEC 100 UNIT/ML ~~LOC~~ SOPN: 10.0000 [IU] | PEN_INJECTOR | Freq: Every day | SUBCUTANEOUS | 0 refills | Status: DC

## 2017-12-29 NOTE — Progress Notes (Signed)
Name: Wendy [name]   MRN: [id number]    DOB: [birth year]   Date:12/29/2017       Progress Note  Subjective  Chief Complaint  Chief Complaint  Patient presents with  . Follow-up    New insulin is causing severe stomach pains.    HPI  DMII: she came in today, because she was started on Xultophy about one month ago , but has noticed severe bloating and epigastric pain, nausea, vomiting and diarrhea. She finally stopped taking medication 3 days ago and symptoms almost resolved, except for some indigestion when she eats - taking Tums. Discussed options and we will try [place] instead   GERD: she is having some reflux symptoms such as epigastric pain and indigestion after meals that is not gone since stopped xultophy, we will try omeprazole    Patient Active Problem List   Diagnosis Date Noted  . Endometrial polyp 11/29/2017  . Fibroid 11/29/2017  . Post-menopausal bleeding 11/14/2017  . Mild nonproliferative diabetic retinopathy of both eyes associated with type 2 diabetes mellitus ([place]) 06/09/2016  . Uncontrolled type 2 diabetes mellitus with nonproliferative retinopathy ([place]) 03/01/2016  . Vitamin D deficiency 07/07/2015  . Vitamin B12 deficiency 07/07/2015  . Snoring 12/25/2014  . Allergic rhinitis, seasonal 12/14/2014  . Chronic constipation 12/14/2014  . Diabetes mellitus with renal manifestation ([place]) 12/14/2014  . Dyslipidemia 12/14/2014  . Dermatitis, eczematoid 12/14/2014  . Edema extremities 12/14/2014  . Essential (primary) hypertension 12/14/2014  . Gastro-esophageal reflux disease without esophagitis 12/14/2014  . Genital herpes in women 12/14/2014  . Extreme obesity 12/14/2014  . Female climacteric state 12/14/2014    Past Surgical History:  Procedure Laterality Date  . CESAREAN SECTION  1993  . tonsillectomy and adnoidectomy    . WISDOM TOOTH EXTRACTION      Family History  Problem Relation Age of Onset  . Diabetes Mother   . Asthma Mother   . Heart disease  Mother   . Heart disease Father   . Prostate cancer Father   . Other Father        blockages in stomach  . Kidney disease Paternal Grandmother   . Asthma Son   . Autism Son   . Breast cancer Neg Hx   . Colon cancer Neg Hx   . Esophageal cancer Neg Hx   . Pancreatic cancer Neg Hx   . Rectal cancer Neg Hx   . Stomach cancer Neg Hx     Social History   Socioeconomic History  . Marital status: Married    Spouse name: Not on file  . Number of children: 1  . Years of education: Not on file  . Highest education level: Not on file  Occupational History  . Not on file  Social Needs  . Financial resource strain: Not on file  . Food insecurity:    Worry: Not on file    Inability: Not on file  . Transportation needs:    Medical: Not on file    Non-medical: Not on file  Tobacco Use  . Smoking status: Former Smoker    Years: 20.00    Types: Cigarettes    Start date: 04/19/1971    Last attempt to quit: 04/19/1991    Years since quitting: 26.7  . Smokeless tobacco: Never Used  Substance and Sexual Activity  . Alcohol use: No    Alcohol/week: 0.0 standard drinks  . Drug use: No  . Sexual activity: Yes    Partners: Male    Birth  control/protection: Post-menopausal  Lifestyle  . Physical activity:    Days per week: Not on file    Minutes per session: Not on file  . Stress: Not on file  Relationships  . Social connections:    Talks on phone: Not on file    Gets together: Not on file    Attends religious service: Not on file    Active member of club or organization: Not on file    Attends meetings of clubs or organizations: Not on file    Relationship status: Not on file  . Intimate partner violence:    Fear of current or ex partner: Not on file    Emotionally abused: Not on file    Physically abused: Not on file    Forced sexual activity: Not on file  Other Topics Concern  . Not on file  Social History Narrative   She lost her job Dec 2017 , worked for 36 years at the  same company .    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  aspirin (ASPIRIN LOW DOSE) 81 MG chewable tablet, Chew 1 tablet by mouth daily., Disp: , Rfl:  .  atorvastatin (LIPITOR) 40 MG tablet, Take 1 tablet (40 mg total) by mouth every evening., Disp: 90 tablet, Rfl: 1 .  B-12, Methylcobalamin, 1000 MCG SUBL, Place 1 tablet under the tongue daily., Disp: 30 tablet, Rfl: 0 .  Cholecalciferol (VITAMIN D) 2000 units CAPS, Take 1 capsule (2,000 Units total) by mouth daily., Disp: 30 capsule, Rfl: 0 .  Empagliflozin-metFORMIN HCl ER (SYNJARDY XR) 25-1000 MG TB24, Take 1 tablet by mouth daily., Disp: 30 tablet, Rfl: 5 .  fluticasone (FLONASE) 50 MCG/ACT nasal spray, Place 2 sprays into both nostrils daily., Disp: 16 g, Rfl: 6 .  telmisartan-hydrochlorothiazide (MICARDIS HCT) 80-25 MG tablet, TAKE 1 TABLET BY MOUTH DAILY. *INS ONLY PAYS FOR 30 DAYS*, Disp: 90 tablet, Rfl: 1 .  valACYclovir (VALTREX) 500 MG tablet, Take 1 tablet (500 mg total) by mouth 3 (three) times daily., Disp: 90 tablet, Rfl: 1 .  venlafaxine XR (EFFEXOR-XR) 75 MG 24 hr capsule, Take 2 capsules (150 mg total) by mouth daily., Disp: 90 capsule, Rfl: 1 .  Blood Glucose Monitoring Suppl (ONE TOUCH ULTRA 2) w/Device KIT, See admin instructions., Disp: , Rfl: 0 .  insulin degludec (TRESIBA FLEXTOUCH) 100 UNIT/ML SOPN FlexTouch Pen, Inject 0.1-0.5 mLs (10-50 Units total) into the skin daily., Disp: 9 mL, Rfl: 0 .  Insulin Pen Needle (NOVOFINE) 32G X 6 MM MISC, 1 each by Does not apply route daily., Disp: 100 each, Rfl: 2 .  omeprazole (PRILOSEC) 40 MG capsule, Take 1 capsule (40 mg total) by mouth daily., Disp: 30 capsule, Rfl: 3 .  ONE TOUCH ULTRA TEST test strip, CHECK FASTING BLOOD SUGARS TWICE DAILY, Disp: 100 each, Rfl: 2 .  ONETOUCH DELICA LANCETS FINE MISC, See admin  instructions., Disp: , Rfl: 0 .  Polyethylene Glycol 3350 POWD, Take by mouth., Disp: , Rfl:   Allergies  Allergen Reactions  . Penicillins Swelling    I personally reviewed active problem list, medication list, allergies, family history, social history with the patient/caregiver today.   ROS  Ten systems reviewed and is negative except as mentioned in HPI   Objective  Vitals:   12/29/17  1318  BP: 132/76  Pulse: 91  Temp: 98 F (36.7 C)  SpO2: 98%  Weight: 253 lb 1.6 oz (114.8 kg)  Height: 5' 4" (1.626 m)    Body mass index is 43.44 kg/m.  Physical Exam  Constitutional: Patient appears well-developed and well-nourished. Obese No distress.  HEENT: head atraumatic, normocephalic, pupils equal and reactive to light,  neck supple, throat within normal limits Cardiovascular: Normal rate, regular rhythm and normal heart sounds.  No murmur heard. No BLE edema. Pulmonary/Chest: Effort normal and breath sounds normal. No respiratory distress. Abdominal: Soft.  There is no tenderness. Psychiatric: Patient has a normal mood and affect. behavior is normal. Judgment and thought content normal.  Recent Results (from the past 2160 hour(s))  POCT HgB A1C     Status: Abnormal   Collection Time: 11/06/17 12:15 PM  Result Value Ref Range   Hemoglobin A1C  4.0 - 5.6 %   HbA1c POC (<> result, manual entry)  4.0 - 5.6 %   HbA1c, POC (prediabetic range)  5.7 - 6.4 %   HbA1c, POC (controlled diabetic range) 9.7 (A) 0.0 - 7.0 %  POCT UA - Microalbumin     Status: Normal   Collection Time: 11/06/17 12:18 PM  Result Value Ref Range   Microalbumin Ur, POC 20 mg/L   Creatinine, POC  mg/dL   Albumin/Creatinine Ratio, Urine, POC        PHQ2/9: Depression screen [facility] 2/9 12/29/2017 11/06/2017 12/15/2016 08/25/2016 03/25/2016  Decreased Interest 0 1 0 0 0  Down, Depressed, Hopeless 0 1 0 0 0  PHQ - 2 Score 0 2 0 0 0  Altered sleeping 3 0 - - -  Tired, decreased energy 0 0 - - -  Change in  appetite 0 0 - - -  Feeling bad or failure about yourself  0 0 - - -  Trouble concentrating 0 0 - - -  Moving slowly or fidgety/restless 0 0 - - -  Suicidal thoughts 0 0 - - -  PHQ-9 Score 3 2 - - -  Difficult doing work/chores Somewhat difficult Somewhat difficult - - -     Fall Risk: Fall Risk  12/29/2017 11/06/2017 05/25/2017 02/09/2017 12/15/2016  Falls in the past year? _0      Functional Status Survey: Is the patient deaf or have difficulty hearing?: No Does the patient have difficulty seeing, even when wearing glasses/contacts?: No Does the patient have difficulty concentrating, remembering, or making decisions?: No Does the patient have difficulty walking or climbing stairs?: No Does the patient have difficulty dressing or bathing?: No Does the patient have difficulty doing errands alone such as visiting a doctor's office or shopping?: No    Assessment & Plan  1. Uncontrolled type 2 diabetes mellitus with nonproliferative retinopathy (HCC)  - insulin degludec (TRESIBA FLEXTOUCH) 100 UNIT/ML SOPN FlexTouch Pen; Inject 0.1-0.5 mLs (10-50 Units total) into the skin daily.  Dispense: 9 mL; Refill: 0  2. Dyspepsia  - omeprazole (PRILOSEC) 40 MG capsule; Take 1 capsule (40 mg total) by mouth daily.  Dispense: 30 capsule; Refill: 3  3. Herpes simplex vulvovaginitis  - valACYclovir (VALTREX) 500 MG tablet; Take 1 tablet (500 mg total) by mouth 3 (three) times daily.  Dispense: 90 tablet; Refill: 1

## 2017-12-29 NOTE — Patient Instructions (Signed)
Start Tresiba at 10 units and go up by 2 units every 3 days to keep sugar in the mornings between 90-140  Call back if you have any questions

## 2018-01-12 ENCOUNTER — Other Ambulatory Visit: Payer: Self-pay | Admitting: Family Medicine

## 2018-01-12 DIAGNOSIS — A6004 Herpesviral vulvovaginitis: Secondary | ICD-10-CM

## 2018-01-15 ENCOUNTER — Other Ambulatory Visit: Payer: Self-pay | Admitting: Family Medicine

## 2018-01-15 DIAGNOSIS — A6004 Herpesviral vulvovaginitis: Secondary | ICD-10-CM

## 2018-02-06 ENCOUNTER — Encounter: Payer: Self-pay | Admitting: Family Medicine

## 2018-02-06 ENCOUNTER — Ambulatory Visit (INDEPENDENT_AMBULATORY_CARE_PROVIDER_SITE_OTHER): Payer: 59 | Admitting: Family Medicine

## 2018-02-06 ENCOUNTER — Ambulatory Visit: Payer: Self-pay

## 2018-02-06 VITALS — BP 128/76 | HR 70 | Temp 98.1°F | Resp 16 | Ht 64.0 in | Wt 256.1 lb

## 2018-02-06 DIAGNOSIS — Z599 Problem related to housing and economic circumstances, unspecified: Secondary | ICD-10-CM

## 2018-02-06 DIAGNOSIS — N951 Menopausal and female climacteric states: Secondary | ICD-10-CM

## 2018-02-06 DIAGNOSIS — Z23 Encounter for immunization: Secondary | ICD-10-CM

## 2018-02-06 DIAGNOSIS — E1121 Type 2 diabetes mellitus with diabetic nephropathy: Secondary | ICD-10-CM

## 2018-02-06 DIAGNOSIS — E133299 Other specified diabetes mellitus with mild nonproliferative diabetic retinopathy without macular edema, unspecified eye: Secondary | ICD-10-CM

## 2018-02-06 DIAGNOSIS — Z598 Other problems related to housing and economic circumstances: Secondary | ICD-10-CM

## 2018-02-06 DIAGNOSIS — I10 Essential (primary) hypertension: Secondary | ICD-10-CM

## 2018-02-06 DIAGNOSIS — E785 Hyperlipidemia, unspecified: Secondary | ICD-10-CM

## 2018-02-06 DIAGNOSIS — F411 Generalized anxiety disorder: Secondary | ICD-10-CM

## 2018-02-06 DIAGNOSIS — R1013 Epigastric pain: Secondary | ICD-10-CM

## 2018-02-06 LAB — POCT GLYCOSYLATED HEMOGLOBIN (HGB A1C): HbA1c, POC (controlled diabetic range): 8.2 % — AB (ref 0.0–7.0)

## 2018-02-06 MED ORDER — INSULIN DEGLUDEC 100 UNIT/ML ~~LOC~~ SOPN: 20.0000 [IU] | PEN_INJECTOR | Freq: Every day | SUBCUTANEOUS | 0 refills | Status: DC

## 2018-02-06 MED ORDER — VENLAFAXINE HCL ER 150 MG PO CP24: 150.0000 mg | ORAL_CAPSULE | Freq: Every day | ORAL | 1 refills | Status: DC

## 2018-02-06 MED ORDER — TELMISARTAN-HCTZ 80-25 MG PO TABS: ORAL_TABLET | ORAL | 1 refills | Status: DC

## 2018-02-06 MED ORDER — EMPAGLIFLOZIN-METFORMIN HCL ER 25-1000 MG PO TB24: 1.0000 | ORAL_TABLET | Freq: Every day | ORAL | 1 refills | Status: DC

## 2018-02-06 MED ORDER — OMEPRAZOLE 40 MG PO CPDR: 40.0000 mg | DELAYED_RELEASE_CAPSULE | Freq: Every day | ORAL | 1 refills | Status: DC

## 2018-02-06 MED ORDER — ATORVASTATIN CALCIUM 40 MG PO TABS: 40.0000 mg | ORAL_TABLET | Freq: Every evening | ORAL | 1 refills | Status: DC

## 2018-02-06 NOTE — Chronic Care Management (AMB) (Signed)
   Care Management   Note  02/06/2018 Name: Wendy [name] MRN: [id number] DOB: 06-19-1960   Wendy [name] is a 57 y.o. year old female who sees Steele Sizer, MD for primary care. Dr. Ancil Boozer asked the CM team to consult the patient for assistance with chronic disease management and care coordination related to DM, HTN, Medication assistance and financial stressors secondary to patient being out of work for some time now . Referral was placed 02/06/18. The CM team met with Wendy [name] face to face and consent obtained for participation in the Care Management Program.    Wendy [name] was given information about Care Management services today including:  1. Case Management services includes personalized support from designated clinical staff supervised by her physician, including individualized plan of care and coordination with other care providers 2. 24/7 contact phone numbers for assistance for urgent and routine care needs. 3. The patient may stop case management services at any time by phone call to the office staff.  Patient agreed to services and verbal consent obtained.   Plan: Will schedule an initial face to face appointment with the Care Management Team within the next 7 days.    Wendy [name] E. Rollene Rotunda, RN, BSN Nurse Care Coordinator [facility] Care Management  [phone]

## 2018-02-06 NOTE — Progress Notes (Signed)
Name: Wendy [name]   MRN: [id number]    DOB: May 11, 1960   Date:02/06/2018       Progress Note  Subjective  Chief Complaint  Chief Complaint  Patient presents with  . Medication Refill  . Diabetes    Start Tresiba on 12/29/2017 and now up to 20 units daily  . Hypertension  . Hyperlipidemia  . Hot Flashes    HPI  DM with nephropathy, retinopathy: we switched her from [place] to tresiba about one month ago because Ozempic was causing severe nausea and she stopped medication.  Her hgbA1C went up from 7.1% to 7.7%, 7.5%., 8.1 % , 9.7 and today is down to 8.2%  She has been taking Synajardi and denies side effects. Glucose has been around 140 fasting.  She refuses to see dietician, explained importance of compliance with follow ups, diet and also medication to get glucose at goal , but happy level is improving, also advised to go up on Tresiba a little more. Urine micro is up to date.   HTN: today she has been compliant with Telmisartan. HCTZ and bp is at goal. No chest pain or palpitation. Denies dizziness  Hyperlipidemia: she states she is back on Atorvastatin, no chest pain. Last lipid panel was at goal. Recheck yearly   Snoring: she did not go to sleep study, and she did not re-schedule it. She always feels tired. Explained importance of sleep study. There is an increase risk of heart attacks and strokes in patient with sleep apnea that are not treated.She continues to refuse it. Unchanged   Extremity obesity:weight is stable since last visit, she has financial difficulties and runs out of money to buy food , we will place referral to chronic care management  Dyspepsia: she has been taking PPI but continues to have indigestion and sometimes dysphagia with most meals, no weight loss , no blood in stools or change in bowel movements. She would like to [facility] GI at [facility] because of cost  Hot Flashes/Depression and GAD: she has been on Effexor, currently only taking 75 mg daily and phq 9  is higher, she states since she has not been working she gets very upset about having to depend on her husband. She cannot afford therapy but is willing to try higher dose of Effexor, we will increase dose to 150 mg daily   Patient Active Problem List   Diagnosis Date Noted  . Endometrial polyp 11/29/2017  . Fibroid 11/29/2017  . Post-menopausal bleeding 11/14/2017  . Mild nonproliferative diabetic retinopathy of both eyes associated with type 2 diabetes mellitus ([place]) 06/09/2016  . Uncontrolled type 2 diabetes mellitus with nonproliferative retinopathy ([place]) 03/01/2016  . Vitamin D deficiency 07/07/2015  . Vitamin B12 deficiency 07/07/2015  . Snoring 12/25/2014  . Allergic rhinitis, seasonal 12/14/2014  . Chronic constipation 12/14/2014  . Diabetes mellitus with renal manifestation ([place]) 12/14/2014  . Dyslipidemia 12/14/2014  . Dermatitis, eczematoid 12/14/2014  . Edema extremities 12/14/2014  . Essential (primary) hypertension 12/14/2014  . Gastro-esophageal reflux disease without esophagitis 12/14/2014  . Genital herpes in women 12/14/2014  . Extreme obesity 12/14/2014  . Female climacteric state 12/14/2014    Past Surgical History:  Procedure Laterality Date  . CESAREAN SECTION  1993  . tonsillectomy and adnoidectomy    . WISDOM TOOTH EXTRACTION      Family History  Problem Relation Age of Onset  . Diabetes Mother   . Asthma Mother   . Heart disease Mother   . Heart disease Father   .  Prostate cancer Father   . Other Father        blockages in stomach  . Kidney disease Paternal Grandmother   . Asthma Son   . Autism Son   . Breast cancer Neg Hx   . Colon cancer Neg Hx   . Esophageal cancer Neg Hx   . Pancreatic cancer Neg Hx   . Rectal cancer Neg Hx   . Stomach cancer Neg Hx     Social History   Socioeconomic History  . Marital status: Married    Spouse name: Not on file  . Number of children: 1  . Years of education: Not on file  . Highest education  level: 12th grade  Occupational History  . Not on file  Social Needs  . Financial resource strain: Somewhat hard  . Food insecurity:    Worry: Often true    Inability: Often true  . Transportation needs:    Medical: No    Non-medical: No  Tobacco Use  . Smoking status: Former Smoker    Years: 20.00    Types: Cigarettes    Start date: 04/19/1971    Last attempt to quit: 04/19/1991    Years since quitting: 26.8  . Smokeless tobacco: Never Used  Substance and Sexual Activity  . Alcohol use: No    Alcohol/week: 0.0 standard drinks  . Drug use: No  . Sexual activity: Yes    Partners: Male    Birth control/protection: Post-menopausal  Lifestyle  . Physical activity:    Days per week: 0 days    Minutes per session: 0 min  . Stress: Very much  Relationships  . Social connections:    Talks on phone: Not on file    Gets together: Not on file    Attends religious service: Not on file    Active member of club or organization: Not on file    Attends meetings of clubs or organizations: Not on file    Relationship status: Not on file  . Intimate partner violence:    Fear of current or ex partner: No    Emotionally abused: No    Physically abused: No    Forced sexual activity: No  Other Topics Concern  . Not on file  Social History Narrative   She lost her job Dec 2017 , worked for 36 years at the same company .    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  aspirin (ASPIRIN LOW DOSE) 81 MG chewable tablet, Chew 1 tablet by mouth daily., Disp: , Rfl:  .  atorvastatin (LIPITOR) 40 MG tablet, Take 1 tablet (40 mg total) by mouth every evening., Disp: 90 tablet, Rfl: 1 .  B-12, Methylcobalamin, 1000 MCG SUBL, Place 1 tablet under the tongue daily., Disp: 30 tablet, Rfl: 0 .  Blood Glucose Monitoring  Suppl (ONE TOUCH ULTRA 2) w/Device KIT, See admin instructions., Disp: , Rfl: 0 .  Cholecalciferol (VITAMIN D) 2000 units CAPS, Take 1 capsule (2,000 Units total) by mouth daily., Disp: 30 capsule, Rfl: 0 .  Empagliflozin-metFORMIN HCl ER (SYNJARDY XR) 25-1000 MG TB24, Take 1 tablet by mouth daily., Disp: 90 tablet, Rfl: 1 .  fluticasone (FLONASE) 50 MCG/ACT nasal spray, Place 2 sprays into both nostrils daily., Disp: 16 g, Rfl:  6 .  insulin degludec (TRESIBA FLEXTOUCH) 100 UNIT/ML SOPN FlexTouch Pen, Inject 0.2-0.5 mLs (20-50 Units total) into the skin daily., Disp: 9 mL, Rfl: 0 .  Insulin Pen Needle (NOVOFINE) 32G X 6 MM MISC, 1 each by Does not apply route daily., Disp: 100 each, Rfl: 2 .  omeprazole (PRILOSEC) 40 MG capsule, Take 1 capsule (40 mg total) by mouth daily., Disp: 90 capsule, Rfl: 1 .  ONE TOUCH ULTRA TEST test strip, CHECK FASTING BLOOD SUGARS TWICE DAILY, Disp: 100 each, Rfl: 2 .  ONETOUCH DELICA LANCETS FINE MISC, See admin instructions., Disp: , Rfl: 0 .  Polyethylene Glycol 3350 POWD, Take by mouth., Disp: , Rfl:  .  telmisartan-hydrochlorothiazide (MICARDIS HCT) 80-25 MG tablet, TAKE 1 TABLET BY MOUTH DAILY. *INS ONLY PAYS FOR 30 DAYS*, Disp: 90 tablet, Rfl: 1 .  valACYclovir (VALTREX) 500 MG tablet, Take 1 tablet (500 mg total) by mouth 3 (three) times daily as needed. And daily for prevention, Disp: 100 tablet, Rfl: 1 .  venlafaxine XR (EFFEXOR-XR) 150 MG 24 hr capsule, Take 1 capsule (150 mg total) by mouth daily., Disp: 90 capsule, Rfl: 1  Allergies  Allergen Reactions  . Penicillins Swelling    I personally reviewed active problem list, medication list, allergies, family history, social history with the patient/caregiver today.   ROS  Constitutional: Negative for fever or weight change.  Respiratory: Negative for cough and shortness of breath.   Cardiovascular: positive  For intermittent pinching sensation of left side of  chest pain - lasts just a few minutes and  resolves by itself but no palpitations.  Gastrointestinal: Negative for abdominal pain, no bowel changes.  Musculoskeletal: Negative for gait problem or joint swelling.  Skin: Negative for rash.  Neurological: Negative for dizziness or headache.  No other specific complaints in a complete review of systems (except as listed in HPI above).   Objective  Vitals:   02/06/18 1428  BP: 128/76  Pulse: 70  Resp: 16  Temp: 98.1 F (36.7 C)  TempSrc: Oral  SpO2: 97%  Weight: 256 lb 1.6 oz (116.2 kg)  Height: 5' 4"  (1.626 m)    Body mass index is 43.96 kg/m.  Physical Exam  Constitutional: Patient appears well-developed and well-nourished. Obese No distress.  HEENT: head atraumatic, normocephalic, pupils equal and reactive to light,  neck supple, throat within normal limits Cardiovascular: Normal rate, regular rhythm and normal heart sounds.  No murmur heard. No BLE edema. Pulmonary/Chest: Effort normal and breath sounds normal. No respiratory distress. Abdominal: Soft.  There is no tenderness. Psychiatric: Patient has a normal mood and affect. behavior is normal. Judgment and thought content normal.  PHQ2/9: Depression screen [facility] 2/9 02/06/2018 12/29/2017 11/06/2017 12/15/2016 08/25/2016  Decreased Interest 2 0 1 0 0  Down, Depressed, Hopeless 1 0 1 0 0  PHQ - 2 Score 3 0 2 0 0  Altered sleeping 3 3 0 - -  Tired, decreased energy 3 0 0 - -  Change in appetite 0 0 0 - -  Feeling bad or failure about yourself  0 0 0 - -  Trouble concentrating 0 0 0 - -  Moving slowly or fidgety/restless 0 0 0 - -  Suicidal thoughts 0 0 0 - -  PHQ-9 Score 9 3 2  - -  Difficult doing work/chores Not difficult at all Somewhat difficult Somewhat difficult - -     Fall Risk: Fall Risk  02/06/2018 12/29/2017 11/06/2017 05/25/2017 02/09/2017  Falls in the past  year? No No No No No    Assessment & Plan   1. Type 2 diabetes with nephropathy (HCC)  - POCT HgB A1C  2. Needs flu shot  Patient  refused  3. Type 2 diabetes mellitus with nonproliferative retinopathy (HCC)  - insulin degludec (TRESIBA FLEXTOUCH) 100 UNIT/ML SOPN FlexTouch Pen; Inject 0.2-0.5 mLs (20-50 Units total) into the skin daily.  Dispense: 9 mL; Refill: 0 - Empagliflozin-metFORMIN HCl ER (SYNJARDY XR) 25-1000 MG TB24; Take 1 tablet by mouth daily.  Dispense: 90 tablet; Refill: 1  4. Female climacteric state  - venlafaxine XR (EFFEXOR-XR) 150 MG 24 hr capsule; Take 1 capsule (150 mg total) by mouth daily.  Dispense: 90 capsule; Refill: 1  5. GAD (generalized anxiety disorder)  - venlafaxine XR (EFFEXOR-XR) 150 MG 24 hr capsule; Take 1 capsule (150 mg total) by mouth daily.  Dispense: 90 capsule; Refill: 1  6. Essential (primary) hypertension  - telmisartan-hydrochlorothiazide (MICARDIS HCT) 80-25 MG tablet; TAKE 1 TABLET BY MOUTH DAILY. *INS ONLY PAYS FOR 30 DAYS*  Dispense: 90 tablet; Refill: 1  7. Dyspepsia  - omeprazole (PRILOSEC) 40 MG capsule; Take 1 capsule (40 mg total) by mouth daily.  Dispense: 90 capsule; Refill: 1 - referral GI   8. Dyslipidemia  - atorvastatin (LIPITOR) 40 MG tablet; Take 1 tablet (40 mg total) by mouth every evening.  Dispense: 90 tablet; Refill: 1  9. Financial difficulties  - Referral to Chronic Care Management Services

## 2018-02-13 ENCOUNTER — Other Ambulatory Visit: Payer: Self-pay | Admitting: Family Medicine

## 2018-02-13 ENCOUNTER — Ambulatory Visit: Payer: 59 | Admitting: Pharmacist

## 2018-02-13 DIAGNOSIS — E669 Obesity, unspecified: Secondary | ICD-10-CM

## 2018-02-13 DIAGNOSIS — Z598 Other problems related to housing and economic circumstances: Secondary | ICD-10-CM

## 2018-02-13 DIAGNOSIS — E538 Deficiency of other specified B group vitamins: Secondary | ICD-10-CM

## 2018-02-13 DIAGNOSIS — E559 Vitamin D deficiency, unspecified: Secondary | ICD-10-CM

## 2018-02-13 DIAGNOSIS — E1121 Type 2 diabetes mellitus with diabetic nephropathy: Secondary | ICD-10-CM

## 2018-02-13 DIAGNOSIS — Z599 Problem related to housing and economic circumstances, unspecified: Secondary | ICD-10-CM

## 2018-02-13 NOTE — Chronic Care Management (AMB) (Signed)
Care Management   Note  02/13/2018 Name: Wendy [name] MRN: [id number] DOB: Jun 13, 1960  Subjective:  57 y.o. year old female referred to CM pharmacist for medication assistance and community resources related to recent financial difficulties.   Referral source: Dr. Ancil Boozer Current insurance: [place]  Objective: Medications Reviewed Today    Reviewed by Cathi Roan, [facility] (Pharmacist) on 02/13/18 at 1338  Med List Status: <None>  Medication Order Taking? Sig Documenting Provider Last Dose Status Informant  aspirin (ASPIRIN LOW DOSE) 81 MG chewable tablet [id number] Yes Chew 1 tablet by mouth daily. [provider] Taking Active            Med Note (DAVIS, SOPHIA A   Mon Jul 04, 2016  2:20 PM)    atorvastatin (LIPITOR) 40 MG tablet [id number] Yes Take 1 tablet (40 mg total) by mouth every evening. Steele Sizer, MD Taking Active   B-12, Methylcobalamin, 1000 MCG SUBL [id number] Yes Place 1 tablet under the tongue daily. Steele Sizer, MD Taking Active   Blood Glucose Monitoring Suppl (ONE TOUCH ULTRA 2) w/Device KIT [id number] Yes See admin instructions. [provider] Taking Active   Cholecalciferol (VITAMIN D) 2000 units CAPS [id number] Yes Take 1 capsule (2,000 Units total) by mouth daily. Steele Sizer, MD Taking Active   Empagliflozin-metFORMIN HCl ER (SYNJARDY XR) 25-1000 MG TB24 [id number] Yes Take 1 tablet by mouth daily. Steele Sizer, MD Taking Active         Discontinued 02/13/18 1337 (No longer needed (for PRN medications))   insulin degludec (TRESIBA FLEXTOUCH) 100 UNIT/ML SOPN FlexTouch Pen [id number] Yes Inject 0.2-0.5 mLs (20-50 Units total) into the skin daily. Steele Sizer, MD Taking Active            Med Note Kary Kos, Blair Heys   Tue Feb 13, 2018  1:33 PM) Takes 25 units   Insulin Pen Needle (NOVOFINE) 32G X 6 MM MISC [id number] No 1 each by Does not apply route daily.  Patient not taking:  Reported on 02/13/2018   Steele Sizer, MD Not  Taking Active         Discontinued 02/13/18 1337 (Patient Preference)   ONE TOUCH ULTRA TEST test strip [id number] No CHECK FASTING BLOOD SUGARS TWICE DAILY  Patient not taking:  Reported on 02/13/2018   Steele Sizer, MD Not Taking Active            Med Note Cathi Roan   Tue Feb 13, 2018  1:34 PM) Needs new strips= too expensive  [facility] LANCETS [place] [id number] Yes See admin instructions. [provider] Taking Active         Discontinued 02/13/18 1337 (No longer needed (for PRN medications))            Med Note (DAVIS, SOPHIA A   Mon Jul 04, 2016  2:20 PM)    telmisartan-hydrochlorothiazide (MICARDIS HCT) 80-25 MG tablet [id number] Yes TAKE 1 TABLET BY MOUTH DAILY. *INS ONLY PAYS FOR 30 DAYS* Sowles, Drue Stager, MD Taking Active   valACYclovir (VALTREX) 500 MG tablet [id number] Yes Take 1 tablet (500 mg total) by mouth 3 (three) times daily as needed. And daily for prevention Steele Sizer, MD Taking Active   venlafaxine XR (EFFEXOR-XR) 150 MG 24 hr capsule [id number] Yes Take 1 capsule (150 mg total) by mouth daily. Steele Sizer, MD Taking Active          Medication Review Findings:  . Patient is not experiencing any known side effects from medications . All  medications match an indication  . Vitamin B12 and Vitamin D lab values have been stable for the past 9 months  Medication Assistance Findings:   . Mail order programs available through [facility] . Discount pharmacy list: [facility] $4 list for venlafaxine and atorvastatin . Already using coupons for branded medications: Ozempic and Synjardy  Goals Addressed            This Visit's Progress   . "I need assistance with my medicines" (pt-stated)        Wendy [name] is experiencing financial difficulties right now. She uses coupons for her brand name medications but wants to continue managing her health conditions during this time of difficulty.   Clinical Goal(s): Over the next 90 days, Wendy [name] will be  adherent to all medications as evidenced by a percentage of days covered [facility]) report of >80%.   Interventions:  -Recommendation to switch atorvastatin and venlafaxine to [facility] $4 list -Recommend switching telmisartan/HCTZ to valsartan/HCTZ as valsartan/HCTZ is on the [facility] $4 list.       Plan: 1. Make recommendation to switch to valsartan/HCTZ 2. Find affordable test strips and pen needles and send in new prescriptions for these items 3. Collaborate with Trish Fountain, nurse care manager, regarding community resources (jobs, food stamps, diet education)   Wendy [name] was given information about Care Management services today including:  1. Case Management services includes personalized support from designated clinical staff supervised by her physician, including individualized plan of care and coordination with other care providers 2. 24/7 contact phone numbers for assistance for urgent and routine care needs. 3. The patient may stop case management services at any time by phone call to the office staff.  Patient agreed to services and verbal consent obtained.  Written patient instructions provided.  Total time in face to face counseling 60 minutes.    Follow up CCM Visit with nurse care manager via telephone in 1 week.   Ruben Reason, PharmD Clinical Pharmacist [facility] [phone]

## 2018-02-13 NOTE — Patient Instructions (Addendum)
1. Consider switching to [facility] for atorvastain 40mg  (generic for Lipitor), venlafaxine ER 150mg  (generic for Effexor XR). These medications will be $4 for 30 days supply or $10 for 3 months supply.    2. Almyra Free or Truddie Crumble will look into some resources in [facility] for a diabetes cooking class, job resources, and [employer].  3. Check blood sugar twice daily: first thing in the morning and one other time during the day 2 hours after eating (2 hours after breakfast, 2 hours after lunch, 2 hours after supper).   4. Try "eating breakfast like a king, eat lunch like a queen, and supper like a pauper": Have a breakfast with a protein (a meat, eggs, cheese, greek yogurt) and eat smaller meals as the day progresses.   5. Turn off TV 30-45 minutes before bedtime. If it helps, listen to gospel music or ministers on your phone to help you go to sleep.    Please call a member of the CCM (Chronic Care Management) Team with any questions or case management needs:   Vanetta Mulders, BSN Nurse Care Coordinator  [phone]  Ruben Reason, PharmD  Clinical Pharmacist  [phone]   Diabetes and Nutrition When you have diabetes, it is very important to have healthy eating habits because your blood sugar (glucose) levels are greatly affected by what you eat and drink. Eating healthy foods in the appropriate amounts, at about the same times every day, can help you:  Control your blood glucose.  Lower your risk of heart disease.  Improve your blood pressure.  Reach or maintain a healthy weight.  Every person with diabetes is different, and each person has different needs for a meal plan. Your health care provider may recommend that you work with a diet and nutrition specialist (dietitian) to make a meal plan that is best for you. Your meal plan may vary depending on factors such as:  The calories you need.  The medicines you take.  Your weight.  Your blood glucose, blood pressure,  and cholesterol levels.  Your activity level.  Other health conditions you have, such as heart or kidney disease.  How do carbohydrates affect me? Carbohydrates affect your blood glucose level more than any other type of food. Eating carbohydrates naturally increases the amount of glucose in your blood.  Foods that contain carbohydrates include:  Bread, cereal, rice, pasta, and crackers.  Potatoes and corn.  Peas, beans, and lentils.  Milk and yogurt.  Fruit and juice.  Desserts, such as cakes, cookies, ice cream, and candy.   What are tips for following this plan? Reading food labels  Start by checking the serving size on the label. The amount of calories, carbohydrates, fats, and other nutrients listed on the label are based on one serving of the food. Many foods contain more than one serving per package.  Check the total grams (g) of carbohydrates in one serving. You can calculate the number of servings of carbohydrates in one serving by dividing the total carbohydrates by 15. For example, if a food has 30 g of total carbohydrates, it would be equal to 2 servings of carbohydrates.  Check the number of grams (g) of saturated and trans fats in one serving. Choose foods that have low or no amount of these fats.  Check the number of milligrams (mg) of sodium in one serving. Most people should limit total sodium intake to less than 2,300 mg per day.  Always check the nutrition information of  foods labeled as "low-fat" or "nonfat". These foods may be higher in added sugar or refined carbohydrates and should be avoided.  Talk to your dietitian to identify your daily goals for nutrients listed on the label. Shopping  Avoid buying canned, premade, or processed foods. These foods tend to be high in fat, sodium, and added sugar.  Shop around the outside edge of the grocery store. This includes fresh fruits and vegetables, bulk grains, fresh meats, and fresh dairy. Cooking  Use  low-heat cooking methods, such as baking, instead of high-heat cooking methods like deep frying.  Cook using healthy oils, such as olive, canola, or sunflower oil.  Avoid cooking with butter, cream, or high-fat meats. Meal planning  Eat meals and snacks regularly, preferably at the same times every day. Avoid going long periods of time without eating.  Eat foods high in fiber, such as fresh fruits, vegetables, beans, and whole grains. Talk to your dietitian about how many servings of carbohydrates you can eat at each meal.  Eat 4-6 ounces of lean protein each day, such as lean meat, chicken, fish, eggs, or tofu. 1 ounce is equal to 1 ounce of meat, chicken, or fish, 1 egg, or 1/4 cup of tofu.  Eat some foods each day that contain healthy fats, such as avocado, nuts, seeds, and fish.     Ms. Sockwell was given information about Care Management services today including:  1. Case Management services includes personalized support from designated clinical staff supervised by her physician, including individualized plan of care and coordination with other care providers 2. 24/7 contact phone numbers for assistance for urgent and routine care needs. 3. The patient may stop case management services at any time by phone call to the office staff.

## 2018-02-13 NOTE — Chronic Care Management (AMB) (Signed)
Chronic Care Management   Initial Visit Note  02/13/2018 Name: Wendy [name] MRN: [id number] DOB: 02-12-1961  Referred by: Steele Sizer, MD Reason for referral : Chronic Care Management (initial face to face visit)   Subjective: "I need help loosing this weight and getting on a schedule"  Objective:  Lab Results  Component Value Date   HGBA1C 8.2 (A) 02/06/2018   BP Readings from Last 3 Encounters:  02/06/18 128/76  12/29/17 132/76  11/29/17 120/80   Wt Readings from Last 3 Encounters:  02/06/18 256 lb 1.6 oz (116.2 kg)  12/29/17 253 lb 1.6 oz (114.8 kg)  11/29/17 249 lb (112.9 kg)      Assessment: Wendy [name] is a 57 y.o. year old female who sees Steele Sizer, MD for primary care. Dr. Ancil Boozer asked the CM team to consult the patient for assistance with chronic disease management and care coordination related to DM, HTN, Medication assistance and financial stressors secondary to patient being out of work for some time now . Referral was placed 02/06/18.   Goals    . "I need assistance with my medicines" (pt-stated)      Wendy [name] is experiencing financial difficulties right now. She uses coupons for her brand name medications but wants to continue managing her health conditions during this time of difficulty.   Clinical Goal(s): Over the next 90 days, Wendy [name] will be adherent to all medications as evidenced by a percentage of days covered [facility]) report of >80%.   Interventions:  -Recommendation to switch atorvastatin and venlafaxine to [facility] $4 list -Recommend switching telmisartan/HCTZ to valsartan/HCTZ as valsartan/HCTZ is on the [facility] $4 list.       . "I need more sleep" (pt-stated)     Patient is currently unemployed and not on a routine schedule. Her husband work swing shift and Wendy [name] does not sleep during the nights her husband works. She watches TV all night and sleeps during the day. She admits to needing to be on a more regular schedule and  solicits the CCM team for help and support.    Clinical Goal(s): Over the next 7 days, patient will verbalize initiation of suggested interventions for better sleep discussed during today's visit.  Interventions: The CCM team provided interventions for better sleep quality ie;  - making a routine, ritual - Christian music  (app on phone) - eye mask - cutting TV off 30 to 45 min prior to sleep time to decrease blue light    . "I would like to lose this weight" (pt-stated)     Patient discussed her desires to loose weight and its effects she feels it will have on her diabetes. Most recent A1C 8.2 down from 9.7. She voices understanding that her diet needs improving. She is currently consuming simple carbs without protein for breakfast and lunch when she is not skipping meals and enjoys a large dinner in the evenings. She also drinks regular soda. Carbohydrates account for much of her dietary intake per patient report. She expressed interest in nutrition/diabetes education related to weightloss.    Clinical Goal(s): Over the next 7 days, patient will report eating more balanced meals including protein on a more regular schedule and limiting her carbohydrates as discussed.   Interventions: Generalized diabetic nutrition education provided including the plate method. EMMI education "Diabetic Meal Planning" reviewed. Discussed with patient effects of regular nutrition on metabolism. Will request referral to diabetic/nutrition education in [place] from PCP.     Plan: CCM Clinic RN  CM will follow up with Wendy [name] in 1 week to address progress towards goals.   Wendy [name] was given information about Chronic Care Management services today including:  1. CCM service includes personalized support from designated clinical staff supervised by her physician, including individualized plan of care and coordination with other care providers 2. 24/7 contact phone numbers for assistance for urgent and routine care  needs. 3. Service will only be billed when office clinical staff spend 20 minutes or more in a month to coordinate care. 4. Only one practitioner may furnish and bill the service in a calendar month. 5. The patient may stop CCM services at any time (effective at the end of the month) by phone call to the office staff. 6. The patient will be responsible for cost sharing (co-pay) of up to 20% of the service fee (after annual deductible is met).  Patient agreed to services and verbal consent obtained.    Ming Kunka E. Rollene Rotunda, RN, BSN Nurse Care Coordinator [facility] Care Management  [phone]

## 2018-02-20 ENCOUNTER — Telehealth: Payer: Self-pay

## 2018-02-22 ENCOUNTER — Telehealth: Payer: Self-pay

## 2018-02-22 ENCOUNTER — Ambulatory Visit: Payer: Self-pay | Admitting: Pharmacist

## 2018-02-22 NOTE — Patient Instructions (Signed)
The care management team will contact you to follow up on medication costs and diabetes education.

## 2018-02-22 NOTE — Chronic Care Management (AMB) (Signed)
  Care Management   Note  02/22/2018 Name: Wendy [name] MRN: [id number] DOB: 09-Aug-1960   57 y.o. year old female referred to Chronic Care Management by Dr. Ancil Boozer for medication and disease state management related to financial difficulties. Chronic conditions include diabetes, HTN, GERD, obesity. Last office visit with Steele Sizer, MD was 02/06/18.   Was unable to reach patient via telephone today and have left HIPAA compliant voicemail asking patient to return my call. (unsuccessful outreach #1).  Plan: Will follow-up within 3-5  business days via telephone.   Ruben Reason, PharmD Clinical Pharmacist [facility] [phone]

## 2018-02-27 ENCOUNTER — Telehealth: Payer: Self-pay

## 2018-02-27 ENCOUNTER — Ambulatory Visit: Payer: Self-pay | Admitting: Pharmacist

## 2018-02-27 NOTE — Chronic Care Management (AMB) (Signed)
  Care Management   Note  02/27/2018 Name: Wendy [name] MRN: [id number] DOB: 06/14/1960   57 y.o. year old female referred to Chronic Care Management by Dr. Ancil Boozer for medication assistance and disease state management related to financial difficulties. Chronic conditions include diabetes, HTN, GERD, obesity. Last office visit with Steele Sizer, MD was 02/06/18. Last office visit with CM team was 02/13/18.    Was unable to reach patient via telephone today and have left HIPAA compliant voicemail asking patient to return my call. (unsuccessful outreach #2).  Plan: Will follow-up within 3-5  business days via telephone.   Ruben Reason, PharmD Clinical Pharmacist [facility] [phone]

## 2018-03-06 ENCOUNTER — Telehealth: Payer: Self-pay

## 2018-03-06 ENCOUNTER — Ambulatory Visit: Payer: Self-pay

## 2018-03-06 DIAGNOSIS — Z599 Problem related to housing and economic circumstances, unspecified: Secondary | ICD-10-CM

## 2018-03-06 DIAGNOSIS — E669 Obesity, unspecified: Secondary | ICD-10-CM

## 2018-03-06 DIAGNOSIS — Z598 Other problems related to housing and economic circumstances: Secondary | ICD-10-CM

## 2018-03-06 DIAGNOSIS — E1121 Type 2 diabetes mellitus with diabetic nephropathy: Secondary | ICD-10-CM

## 2018-03-06 NOTE — Chronic Care Management (AMB) (Signed)
  Care Management Note  57 y.o. year old female referred to Chronic Care Management by Dr. Ancil Boozer for medication assistance and disease state management related to financial difficulties. Chronic conditions include diabetes, HTN, GERD, obesity. Last office visit with Steele Sizer, MD was 02/06/18. Last office visit with CM team was 02/13/18.  Was unable to reach patient via telephone today for follow up progression towards goals established during face to face encounter with the CCM Team. I have left HIPAA compliant voicemail asking patient to return my call. (unsuccessful outreach #3).  Plan: The CCM Team will remain available to patient for chronic case management needs. Will suspend contact efforts to engage patient as CCM Team has made 3 unsuccessful call attempts to reach patient.     Fabiha Rougeau E. Rollene Rotunda, RN, BSN Nurse Care Coordinator [facility] Care Management  [phone]

## 2018-03-26 ENCOUNTER — Other Ambulatory Visit: Payer: Self-pay | Admitting: Family Medicine

## 2018-03-26 DIAGNOSIS — Z1231 Encounter for screening mammogram for malignant neoplasm of breast: Secondary | ICD-10-CM

## 2018-03-27 ENCOUNTER — Encounter: Payer: Self-pay | Admitting: Family Medicine

## 2018-03-27 ENCOUNTER — Ambulatory Visit (INDEPENDENT_AMBULATORY_CARE_PROVIDER_SITE_OTHER): Payer: 59 | Admitting: Family Medicine

## 2018-03-27 ENCOUNTER — Ambulatory Visit
Admission: RE | Admit: 2018-03-27 | Discharge: 2018-03-27 | Disposition: A | Discharge status: Home / Self Care | Payer: 59 | Source: Ambulatory Visit | Attending: Family Medicine | Admitting: Family Medicine

## 2018-03-27 VITALS — BP 138/90 | HR 100 | Temp 98.3°F | Resp 16 | Ht 63.0 in | Wt 261.8 lb

## 2018-03-27 DIAGNOSIS — Z1231 Encounter for screening mammogram for malignant neoplasm of breast: Secondary | ICD-10-CM | POA: Diagnosis not present

## 2018-03-27 DIAGNOSIS — H6983 Other specified disorders of Eustachian tube, bilateral: Secondary | ICD-10-CM

## 2018-03-27 DIAGNOSIS — D259 Leiomyoma of uterus, unspecified: Secondary | ICD-10-CM | POA: Diagnosis not present

## 2018-03-27 DIAGNOSIS — H9203 Otalgia, bilateral: Secondary | ICD-10-CM | POA: Diagnosis not present

## 2018-03-27 DIAGNOSIS — J069 Acute upper respiratory infection, unspecified: Secondary | ICD-10-CM

## 2018-03-27 DIAGNOSIS — R296 Repeated falls: Secondary | ICD-10-CM

## 2018-03-27 DIAGNOSIS — Z01419 Encounter for gynecological examination (general) (routine) without abnormal findings: Secondary | ICD-10-CM

## 2018-03-27 DIAGNOSIS — N950 Postmenopausal bleeding: Secondary | ICD-10-CM | POA: Diagnosis not present

## 2018-03-27 DIAGNOSIS — I10 Essential (primary) hypertension: Secondary | ICD-10-CM

## 2018-03-27 MED ORDER — FLUTICASONE PROPIONATE 50 MCG/ACT NA SUSP: 2.0000 | Freq: Every day | NASAL | 0 refills | Status: DC

## 2018-03-27 NOTE — Patient Instructions (Signed)
Preventive Care 40-64 Years, Female Preventive care refers to lifestyle choices and visits with your health care provider that can promote health and wellness. What does preventive care include?  A yearly physical exam. This is also called an annual well check.  Dental exams once or twice a year.  Routine eye exams. Ask your health care provider how often you should have your eyes checked.  Personal lifestyle choices, including: ? Daily care of your teeth and gums. ? Regular physical activity. ? Eating a healthy diet. ? Avoiding tobacco and drug use. ? Limiting alcohol use. ? Practicing safe sex. ? Taking low-dose aspirin daily starting at age 58. ? Taking vitamin and mineral supplements as recommended by your health care provider. What happens during an annual well check? The services and screenings done by your health care provider during your annual well check will depend on your age, overall health, lifestyle risk factors, and family history of disease. Counseling Your health care provider may ask you questions about your:  Alcohol use.  Tobacco use.  Drug use.  Emotional well-being.  Home and relationship well-being.  Sexual activity.  Eating habits.  Work and work Statistician.  Method of birth control.  Menstrual cycle.  Pregnancy history.  Screening You may have the following tests or measurements:  Height, weight, and BMI.  Blood pressure.  Lipid and cholesterol levels. These may be checked every 5 years, or more frequently if you are over 81 years old.  Skin check.  Lung cancer screening. You may have this screening every year starting at age 78 if you have a 30-pack-year history of smoking and currently smoke or have quit within the past 15 years.  Fecal occult blood test (FOBT) of the stool. You may have this test every year starting at age 65.  Flexible sigmoidoscopy or colonoscopy. You may have a sigmoidoscopy every 5 years or a colonoscopy  every 10 years starting at age 30.  Hepatitis C blood test.  Hepatitis B blood test.  Sexually transmitted disease (STD) testing.  Diabetes screening. This is done by checking your blood sugar (glucose) after you have not eaten for a while (fasting). You may have this done every 1-3 years.  Mammogram. This may be done every 1-2 years. Talk to your health care provider about when you should start having regular mammograms. This may depend on whether you have a family history of breast cancer.  BRCA-related cancer screening. This may be done if you have a family history of breast, ovarian, tubal, or peritoneal cancers.  Pelvic exam and Pap test. This may be done every 3 years starting at age 80. Starting at age 36, this may be done every 5 years if you have a Pap test in combination with an HPV test.  Bone density scan. This is done to screen for osteoporosis. You may have this scan if you are at high risk for osteoporosis.  Discuss your test results, treatment options, and if necessary, the need for more tests with your health care provider. Vaccines Your health care provider may recommend certain vaccines, such as:  Influenza vaccine. This is recommended every year.  Tetanus, diphtheria, and acellular pertussis (Tdap, Td) vaccine. You may need a Td booster every 10 years.  Varicella vaccine. You may need this if you have not been vaccinated.  Zoster vaccine. You may need this after age 5.  Measles, mumps, and rubella (MMR) vaccine. You may need at least one dose of MMR if you were born in  1957 or later. You may also need a second dose.  Pneumococcal 13-valent conjugate (PCV13) vaccine. You may need this if you have certain conditions and were not previously vaccinated.  Pneumococcal polysaccharide (PPSV23) vaccine. You may need one or two doses if you smoke cigarettes or if you have certain conditions.  Meningococcal vaccine. You may need this if you have certain  conditions.  Hepatitis A vaccine. You may need this if you have certain conditions or if you travel or work in places where you may be exposed to hepatitis A.  Hepatitis B vaccine. You may need this if you have certain conditions or if you travel or work in places where you may be exposed to hepatitis B.  Haemophilus influenzae type b (Hib) vaccine. You may need this if you have certain conditions.  Talk to your health care provider about which screenings and vaccines you need and how often you need them. This information is not intended to replace advice given to you by your health care provider. Make sure you discuss any questions you have with your health care provider. Document Released: 05/01/2015 Document Revised: 12/23/2015 Document Reviewed: 02/03/2015 Elsevier Interactive Patient Education  2018 [employer]  

## 2018-03-27 NOTE — Progress Notes (Signed)
Name: Wendy [name]   MRN: [id number]    DOB: February 23, 1961   Date:03/27/2018       Progress Note  Subjective  Chief Complaint  Chief Complaint  Patient presents with  . Annual Exam    HPI   Patient presents for annual CPE and otalgia  URI: she states she had an URI a couple of weeks ago, she states she had chest congestion and wheezing, mild cough, she used her son's inhaler but continues to have mild sore throat and ear pain that comes and goes.   Recurrent Falls: she denies dizziness, she states she just stumbles and falls, and not sure why. Discussed PT but she refuses  Diet: she avoids junk food , not very compliant with diabetic diet because of holidays  Exercise: going to the gym 7 days a week for 30-40 minutes     Office Visit from 03/27/2018 in [facility]  AUDIT-C Score  3     Depression:  Depression screen [facility] 2/9 03/27/2018 02/06/2018 12/29/2017 11/06/2017 12/15/2016  Decreased Interest 0 2 0 1 0  Down, Depressed, Hopeless 0 1 0 1 0  PHQ - 2 Score 0 3 0 2 0  Altered sleeping _0 0 -  Tired, decreased energy 2 3 0 0 -  Change in appetite 2 0 0 0 -  Feeling bad or failure about yourself  0 0 0 0 -  Trouble concentrating 0 0 0 0 -  Moving slowly or fidgety/restless 0 0 0 0 -  Suicidal thoughts 0 0 0 0 -  PHQ-9 Score _1 -  Difficult doing work/chores Somewhat difficult Not difficult at all Somewhat difficult Somewhat difficult -   Hypertension: BP Readings from Last 3 Encounters:  03/27/18 138/90  02/06/18 128/76  12/29/17 132/76   Obesity: Wt Readings from Last 3 Encounters:  03/27/18 261 lb 12.8 oz (118.8 kg)  02/06/18 256 lb 1.6 oz (116.2 kg)  12/29/17 253 lb 1.6 oz (114.8 kg)   BMI Readings from Last 3 Encounters:  03/27/18 46.38 kg/m  02/06/18 43.96 kg/m  12/29/17 43.44 kg/m    Hep C Screening: negative screen  STD testing and prevention (HIV/chl/gon/syphilis): not interested Intimate partner violence: negative screen   Sexual History/Pain during Intercourse: pain during intercourse and has post-coital bleeding, needs to follow up with GYN  Menstrual History/LMP/Abnormal Bleeding: post-menopausal bleeding , seen by Dr. Kenton Kingfisher but wants to see someone else. She also has cramps  Incontinence Symptoms:  Urinary frequency, she states she is thirsty all the time.   Advanced Care Planning: A voluntary discussion about advance care planning including the explanation and discussion of advance directives.  Discussed health care proxy and Living will, and the patient was able to identify a health care proxy as husband .  Patient does not have a living will at present time.  Breast cancer:  HM Mammogram  Date Value Ref Range Status  06/14/2013 normal  Final    BRCA gene screening: N/A Cervical cancer screening: up do date   Osteoporosis Screening: we will wait for now  Lipids:  Lab Results  Component Value Date   CHOL 146 05/25/2017   CHOL 149 08/25/2016   CHOL 166 12/09/2015   Lab Results  Component Value Date   HDL 62 05/25/2017   HDL 42 (L) 08/25/2016   HDL 52 12/09/2015   Lab Results  Component Value Date   LDLCALC 68 05/25/2017   [place] 83 08/25/2016  LDLCALC 94 12/09/2015   Lab Results  Component Value Date   TRIG 82 05/25/2017   TRIG 120 08/25/2016   TRIG 100 12/09/2015   Lab Results  Component Value Date   CHOLHDL 2.4 05/25/2017   CHOLHDL 3.5 08/25/2016   CHOLHDL 3.2 12/09/2015   No results found for: LDLDIRECT  Glucose:  Glucose, Bld  Date Value Ref Range Status  05/25/2017 121 65 - 139 mg/dL Final    Comment:    .        Non-fasting reference interval .   08/25/2016 137 (H) 65 - 99 mg/dL Final  12/09/2015 158 (H) 65 - 99 mg/dL Final    Skin cancer: discussed atypical lesions  Colorectal cancer: up to date   Lung cancer:  Low Dose CT Chest recommended if Age 69-80 years, 30 pack-year currently smoking OR have quit w/in 15years. Patient had quit for many years,  resumed recently a few cigarettes daily but she will try to quit again  ECG: today   Patient Active Problem List   Diagnosis Date Noted  . Endometrial polyp 11/29/2017  . Fibroid 11/29/2017  . Post-menopausal bleeding 11/14/2017  . Mild nonproliferative diabetic retinopathy of both eyes associated with type 2 diabetes mellitus ([place]) 06/09/2016  . Uncontrolled type 2 diabetes mellitus with nonproliferative retinopathy ([place]) 03/01/2016  . Vitamin D deficiency 07/07/2015  . Vitamin B12 deficiency 07/07/2015  . Snoring 12/25/2014  . Allergic rhinitis, seasonal 12/14/2014  . Chronic constipation 12/14/2014  . Diabetes mellitus with renal manifestation ([place]) 12/14/2014  . Dyslipidemia 12/14/2014  . Dermatitis, eczematoid 12/14/2014  . Edema extremities 12/14/2014  . Essential (primary) hypertension 12/14/2014  . Gastro-esophageal reflux disease without esophagitis 12/14/2014  . Genital herpes in women 12/14/2014  . Extreme obesity 12/14/2014  . Female climacteric state 12/14/2014    Past Surgical History:  Procedure Laterality Date  . CESAREAN SECTION  1993  . tonsillectomy and adnoidectomy    . WISDOM TOOTH EXTRACTION      Family History  Problem Relation Age of Onset  . Diabetes Mother   . Asthma Mother   . Heart disease Mother   . Heart disease Father   . Prostate cancer Father   . Other Father        blockages in stomach  . Kidney disease Paternal Grandmother   . Asthma Son   . Autism Son   . Breast cancer Neg Hx   . Colon cancer Neg Hx   . Esophageal cancer Neg Hx   . Pancreatic cancer Neg Hx   . Rectal cancer Neg Hx   . Stomach cancer Neg Hx     Social History   Socioeconomic History  . Marital status: Married    Spouse name: Not on file  . Number of children: 1  . Years of education: Not on file  . Highest education level: 12th grade  Occupational History    Comment: unemployed   Social Needs  . Financial resource strain: Somewhat hard  . Food  insecurity:    Worry: Often true    Inability: Often true  . Transportation needs:    Medical: No    Non-medical: No  Tobacco Use  . Smoking status: Light Tobacco Smoker    Years: 20.00    Types: Cigarettes    Start date: 04/19/1971    Last attempt to quit: 04/19/1991    Years since quitting: 26.9  . Smokeless tobacco: Never Used  . Tobacco comment: she quitted and started back  October 2019  Substance and Sexual Activity  . Alcohol use: No    Alcohol/week: 0.0 standard drinks  . Drug use: No  . Sexual activity: Yes    Partners: Male    Birth control/protection: Post-menopausal  Lifestyle  . Physical activity:    Days per week: 0 days    Minutes per session: 0 min  . Stress: Very much  Relationships  . Social connections:    Talks on phone: Not on file    Gets together: Not on file    Attends religious service: Not on file    Active member of club or organization: Not on file    Attends meetings of clubs or organizations: Not on file    Relationship status: Not on file  . Intimate partner violence:    Fear of current or ex partner: No    Emotionally abused: No    Physically abused: No    Forced sexual activity: No  Other Topics Concern  . Not on file  Social History Narrative   She lost her job Dec 2017 , worked for 36 years at the same company .    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  aspirin (ASPIRIN LOW DOSE) 81 MG chewable tablet, Chew 1 tablet by mouth daily., Disp: , Rfl:  .  atorvastatin (LIPITOR) 40 MG tablet, Take 1 tablet (40 mg total) by mouth every evening., Disp: 90 tablet, Rfl: 1 .  B-12, Methylcobalamin, 1000 MCG SUBL, Place 1 tablet under the tongue daily., Disp: 30 tablet, Rfl: 0 .  Blood Glucose Monitoring Suppl (ONE TOUCH ULTRA 2) w/Device KIT, See admin  instructions., Disp: , Rfl: 0 .  Cholecalciferol (VITAMIN D) 2000 units CAPS, Take 1 capsule (2,000 Units total) by mouth daily., Disp: 30 capsule, Rfl: 0 .  Empagliflozin-metFORMIN HCl ER (SYNJARDY XR) 25-1000 MG TB24, Take 1 tablet by mouth daily., Disp: 90 tablet, Rfl: 1 .  insulin degludec (TRESIBA FLEXTOUCH) 100 UNIT/ML SOPN FlexTouch Pen, Inject 0.2-0.5 mLs (20-50 Units total) into the skin daily., Disp: 9 mL, Rfl: 0 .  Insulin Pen Needle (NOVOFINE) 32G X 6 MM MISC, 1 each by Does not apply route daily., Disp: 100 each, Rfl: 2 .  ONE TOUCH ULTRA TEST test strip, CHECK FASTING BLOOD SUGARS TWICE DAILY, Disp: 100 each, Rfl: 2 .  ONETOUCH DELICA LANCETS FINE MISC, See admin instructions., Disp: , Rfl: 0 .  telmisartan-hydrochlorothiazide (MICARDIS HCT) 80-25 MG tablet, TAKE 1 TABLET BY MOUTH DAILY. *INS ONLY PAYS FOR 30 DAYS*, Disp: 90 tablet, Rfl: 1 .  valACYclovir (VALTREX) 500 MG tablet, Take 1 tablet (500 mg total) by mouth 3 (three) times daily as needed. And daily for prevention, Disp: 100 tablet, Rfl: 1 .  venlafaxine XR (EFFEXOR-XR) 150 MG 24 hr capsule, Take 1 capsule (150 mg total) by mouth daily., Disp: 90 capsule, Rfl: 1  Allergies  Allergen Reactions  . Penicillins Swelling     ROS  Constitutional: Negative for fever or weight change.  Respiratory: Negative for cough and shortness of breath.   Cardiovascular: Negative for chest pain or palpitations.  Gastrointestinal: Negative for abdominal pain, no bowel changes.  Musculoskeletal: Negative for gait problem or joint swelling.  Skin: Negative for rash.  Neurological: Negative for dizziness or headache.  No other specific complaints in a complete review of systems (except as listed in HPI above).  Objective  Vitals:   03/27/18 1247  BP: 138/90  Pulse: 100  Resp: 16  Temp: 98.3 F (36.8 C)  TempSrc: Oral  SpO2: 99%  Weight: 261 lb 12.8 oz (118.8 kg)  Height: _0  (1.6 m)    Body mass index is 46.38  kg/m.  Physical Exam  Constitutional: Patient appears well-developed and obese . No distress.  HENT: Head: Normocephalic and atraumatic. Ears: B TMs ok, no erythema or effusion; Nose: Nose normal. Mouth/Throat: Oropharynx is clear and moist. No oropharyngeal exudate.  Eyes: Conjunctivae and EOM are normal. Pupils are equal, round, and reactive to light. No scleral icterus.  Neck: Normal range of motion. Neck supple. No JVD present. No thyromegaly present.  Cardiovascular: Normal rate, regular rhythm and normal heart sounds.  No murmur heard. No BLE edema. Pulmonary/Chest: Effort normal and breath sounds normal. No respiratory distress. Abdominal: Soft. Bowel sounds are normal, no distension. There is no tenderness. no masses Breast: no lumps or masses, no nipple discharge or rashes FEMALE GENITALIA:  Not done RECTAL: not done Musculoskeletal: Normal range of motion, no joint effusions. No gross deformities Neurological: he is alert and oriented to person, place, and time. No cranial nerve deficit. Coordination, balance, strength, speech and gait are normal.  Skin: Skin is warm and dry. No rash noted. No erythema.  Psychiatric: Patient has a normal mood and affect. behavior is normal. Judgment and thought content normal.   Recent Results (from the past 2160 hour(s))  POCT HgB A1C     Status: Abnormal   Collection Time: 02/06/18  5:16 PM  Result Value Ref Range   Hemoglobin A1C     HbA1c POC (<> result, manual entry)     HbA1c, POC (prediabetic range)     HbA1c, POC (controlled diabetic range) 8.2 (A) 0.0 - 7.0 %      PHQ2/9: Depression screen [facility] 2/9 03/27/2018 02/06/2018 12/29/2017 11/06/2017 12/15/2016  Decreased Interest 0 2 0 1 0  Down, Depressed, Hopeless 0 1 0 1 0  PHQ - 2 Score 0 3 0 2 0  Altered sleeping _1 0 -  Tired, decreased energy 2 3 0 0 -  Change in appetite 2 0 0 0 -  Feeling bad or failure about yourself  0 0 0 0 -  Trouble concentrating 0 0 0 0 -  Moving  slowly or fidgety/restless 0 0 0 0 -  Suicidal thoughts 0 0 0 0 -  PHQ-9 Score _2 -  Difficult doing work/chores Somewhat difficult Not difficult at all Somewhat difficult Somewhat difficult -     Fall Risk: Fall Risk  03/27/2018 02/06/2018 12/29/2017 11/06/2017 05/25/2017  Falls in the past year? 1 No No No No  Number falls in past yr: 1 - - - -  Injury with Fall? 0 - - - -  Risk for fall due to : History of fall(s) - - - -  Follow up Falls prevention discussed - - - -     Functional Status Survey: Is the patient deaf or have difficulty hearing?: No Does the patient have difficulty seeing, even when wearing glasses/contacts?: No Does the patient have difficulty concentrating, remembering, or making decisions?: No Does the patient have difficulty walking or climbing stairs?: No Does the patient have difficulty dressing or bathing?: No Does the patient have difficulty doing errands alone such as visiting a  doctor's office or shopping?: No   Assessment & Plan  1. Well woman exam   2. Post-menopausal bleeding  She wants to see another provider, was seeing Dr. Kenton Kingfisher, we will refer her to [facility]   3. Uterine leiomyoma, unspecified location  - Ambulatory referral to Obstetrics / Gynecology  4. Otalgia of both ears  Likely eustachian tube dysfunction we will try nasal steroid   5. Recent URI  She was wheezing, explained likely from starting smoking again   6. Essential (primary) hypertension  - EKG 12-Lead  7. Recurrent falls  Refused referral to PT   -USPSTF grade A and B recommendations reviewed with patient; age-appropriate recommendations, preventive care, screening tests, etc discussed and encouraged; healthy living encouraged; see AVS for patient education given to patient -Discussed importance of 150 minutes of physical activity weekly, eat two servings of fish weekly, eat one serving of tree nuts ( cashews, pistachios, pecans, almonds.Marland Kitchen) every other day,  eat 6 servings of fruit/vegetables daily and drink plenty of water and avoid sweet beverages.

## 2018-04-18 HISTORY — PX: APPLICATION OF WOUND VAC: SHX5189

## 2018-05-06 ENCOUNTER — Other Ambulatory Visit: Payer: Self-pay | Admitting: Family Medicine

## 2018-05-06 DIAGNOSIS — E133299 Other specified diabetes mellitus with mild nonproliferative diabetic retinopathy without macular edema, unspecified eye: Secondary | ICD-10-CM

## 2018-05-09 ENCOUNTER — Ambulatory Visit: Payer: 59 | Admitting: Family Medicine

## 2018-05-14 ENCOUNTER — Encounter (HOSPITAL_COMMUNITY)
Admission: EM | Disposition: A | Discharge status: Home Health | Payer: Self-pay | Source: Home / Self Care | Attending: Internal Medicine

## 2018-05-14 ENCOUNTER — Emergency Department (HOSPITAL_COMMUNITY): Payer: 59

## 2018-05-14 ENCOUNTER — Inpatient Hospital Stay (HOSPITAL_COMMUNITY): Payer: 59 | Admitting: Certified Registered"

## 2018-05-14 ENCOUNTER — Other Ambulatory Visit: Payer: Self-pay

## 2018-05-14 ENCOUNTER — Encounter (HOSPITAL_COMMUNITY): Payer: Self-pay | Admitting: Internal Medicine

## 2018-05-14 ENCOUNTER — Inpatient Hospital Stay (HOSPITAL_COMMUNITY)
Admission: EM | Admit: 2018-05-14 | Discharge: 2018-06-04 | DRG: 853 | Disposition: A | Discharge status: Home Health | Payer: 59 | Attending: Internal Medicine | Admitting: Internal Medicine

## 2018-05-14 DIAGNOSIS — E113293 Type 2 diabetes mellitus with mild nonproliferative diabetic retinopathy without macular edema, bilateral: Secondary | ICD-10-CM | POA: Diagnosis present

## 2018-05-14 DIAGNOSIS — J189 Pneumonia, unspecified organism: Secondary | ICD-10-CM | POA: Diagnosis not present

## 2018-05-14 DIAGNOSIS — Z978 Presence of other specified devices: Secondary | ICD-10-CM

## 2018-05-14 DIAGNOSIS — Z794 Long term (current) use of insulin: Secondary | ICD-10-CM

## 2018-05-14 DIAGNOSIS — J9601 Acute respiratory failure with hypoxia: Secondary | ICD-10-CM | POA: Diagnosis not present

## 2018-05-14 DIAGNOSIS — I119 Hypertensive heart disease without heart failure: Secondary | ICD-10-CM | POA: Diagnosis present

## 2018-05-14 DIAGNOSIS — IMO0002 Reserved for concepts with insufficient information to code with codable children: Secondary | ICD-10-CM | POA: Diagnosis present

## 2018-05-14 DIAGNOSIS — I10 Essential (primary) hypertension: Secondary | ICD-10-CM | POA: Diagnosis not present

## 2018-05-14 DIAGNOSIS — E559 Vitamin D deficiency, unspecified: Secondary | ICD-10-CM | POA: Diagnosis present

## 2018-05-14 DIAGNOSIS — Z8249 Family history of ischemic heart disease and other diseases of the circulatory system: Secondary | ICD-10-CM

## 2018-05-14 DIAGNOSIS — Z833 Family history of diabetes mellitus: Secondary | ICD-10-CM | POA: Diagnosis not present

## 2018-05-14 DIAGNOSIS — J9811 Atelectasis: Secondary | ICD-10-CM | POA: Diagnosis not present

## 2018-05-14 DIAGNOSIS — K6389 Other specified diseases of intestine: Secondary | ICD-10-CM | POA: Insufficient documentation

## 2018-05-14 DIAGNOSIS — E113299 Type 2 diabetes mellitus with mild nonproliferative diabetic retinopathy without macular edema, unspecified eye: Secondary | ICD-10-CM | POA: Diagnosis present

## 2018-05-14 DIAGNOSIS — R1084 Generalized abdominal pain: Secondary | ICD-10-CM | POA: Diagnosis not present

## 2018-05-14 DIAGNOSIS — R52 Pain, unspecified: Secondary | ICD-10-CM | POA: Diagnosis not present

## 2018-05-14 DIAGNOSIS — R0602 Shortness of breath: Secondary | ICD-10-CM | POA: Diagnosis not present

## 2018-05-14 DIAGNOSIS — K559 Vascular disorder of intestine, unspecified: Secondary | ICD-10-CM | POA: Diagnosis present

## 2018-05-14 DIAGNOSIS — Z880 Allergy status to penicillin: Secondary | ICD-10-CM

## 2018-05-14 DIAGNOSIS — Z8042 Family history of malignant neoplasm of prostate: Secondary | ICD-10-CM

## 2018-05-14 DIAGNOSIS — Z6841 Body Mass Index (BMI) 40.0 and over, adult: Secondary | ICD-10-CM | POA: Diagnosis not present

## 2018-05-14 DIAGNOSIS — A419 Sepsis, unspecified organism: Secondary | ICD-10-CM | POA: Diagnosis not present

## 2018-05-14 DIAGNOSIS — G9341 Metabolic encephalopathy: Secondary | ICD-10-CM | POA: Diagnosis not present

## 2018-05-14 DIAGNOSIS — F05 Delirium due to known physiological condition: Secondary | ICD-10-CM | POA: Diagnosis not present

## 2018-05-14 DIAGNOSIS — K219 Gastro-esophageal reflux disease without esophagitis: Secondary | ICD-10-CM | POA: Diagnosis present

## 2018-05-14 DIAGNOSIS — R5381 Other malaise: Secondary | ICD-10-CM | POA: Diagnosis not present

## 2018-05-14 DIAGNOSIS — E870 Hyperosmolality and hypernatremia: Secondary | ICD-10-CM | POA: Diagnosis not present

## 2018-05-14 DIAGNOSIS — F17210 Nicotine dependence, cigarettes, uncomplicated: Secondary | ICD-10-CM | POA: Diagnosis present

## 2018-05-14 DIAGNOSIS — E785 Hyperlipidemia, unspecified: Secondary | ICD-10-CM | POA: Diagnosis present

## 2018-05-14 DIAGNOSIS — R109 Unspecified abdominal pain: Secondary | ICD-10-CM | POA: Diagnosis not present

## 2018-05-14 DIAGNOSIS — K659 Peritonitis, unspecified: Secondary | ICD-10-CM | POA: Diagnosis present

## 2018-05-14 DIAGNOSIS — A4181 Sepsis due to Enterococcus: Principal | ICD-10-CM | POA: Diagnosis present

## 2018-05-14 DIAGNOSIS — Z7951 Long term (current) use of inhaled steroids: Secondary | ICD-10-CM

## 2018-05-14 DIAGNOSIS — N179 Acute kidney failure, unspecified: Secondary | ICD-10-CM | POA: Diagnosis present

## 2018-05-14 DIAGNOSIS — E1169 Type 2 diabetes mellitus with other specified complication: Secondary | ICD-10-CM | POA: Diagnosis present

## 2018-05-14 DIAGNOSIS — Z825 Family history of asthma and other chronic lower respiratory diseases: Secondary | ICD-10-CM

## 2018-05-14 DIAGNOSIS — J90 Pleural effusion, not elsewhere classified: Secondary | ICD-10-CM | POA: Diagnosis not present

## 2018-05-14 DIAGNOSIS — K56609 Unspecified intestinal obstruction, unspecified as to partial versus complete obstruction: Secondary | ICD-10-CM | POA: Diagnosis not present

## 2018-05-14 DIAGNOSIS — R1110 Vomiting, unspecified: Secondary | ICD-10-CM

## 2018-05-14 DIAGNOSIS — K561 Intussusception: Secondary | ICD-10-CM | POA: Diagnosis not present

## 2018-05-14 DIAGNOSIS — R140 Abdominal distension (gaseous): Secondary | ICD-10-CM | POA: Diagnosis not present

## 2018-05-14 DIAGNOSIS — K55029 Acute infarction of small intestine, extent unspecified: Secondary | ICD-10-CM | POA: Diagnosis not present

## 2018-05-14 DIAGNOSIS — R6521 Severe sepsis with septic shock: Secondary | ICD-10-CM | POA: Diagnosis present

## 2018-05-14 DIAGNOSIS — J986 Disorders of diaphragm: Secondary | ICD-10-CM | POA: Diagnosis not present

## 2018-05-14 DIAGNOSIS — R0689 Other abnormalities of breathing: Secondary | ICD-10-CM | POA: Diagnosis not present

## 2018-05-14 DIAGNOSIS — J181 Lobar pneumonia, unspecified organism: Secondary | ICD-10-CM | POA: Diagnosis not present

## 2018-05-14 DIAGNOSIS — Y95 Nosocomial condition: Secondary | ICD-10-CM | POA: Diagnosis not present

## 2018-05-14 DIAGNOSIS — L309 Dermatitis, unspecified: Secondary | ICD-10-CM | POA: Diagnosis present

## 2018-05-14 DIAGNOSIS — L8990 Pressure ulcer of unspecified site, unspecified stage: Secondary | ICD-10-CM

## 2018-05-14 DIAGNOSIS — K567 Ileus, unspecified: Secondary | ICD-10-CM | POA: Diagnosis not present

## 2018-05-14 DIAGNOSIS — R000 Tachycardia, unspecified: Secondary | ICD-10-CM | POA: Diagnosis not present

## 2018-05-14 DIAGNOSIS — Z4682 Encounter for fitting and adjustment of non-vascular catheter: Secondary | ICD-10-CM | POA: Diagnosis not present

## 2018-05-14 DIAGNOSIS — K631 Perforation of intestine (nontraumatic): Secondary | ICD-10-CM | POA: Diagnosis not present

## 2018-05-14 DIAGNOSIS — K8020 Calculus of gallbladder without cholecystitis without obstruction: Secondary | ICD-10-CM | POA: Diagnosis not present

## 2018-05-14 DIAGNOSIS — K56699 Other intestinal obstruction unspecified as to partial versus complete obstruction: Secondary | ICD-10-CM | POA: Diagnosis not present

## 2018-05-14 DIAGNOSIS — R112 Nausea with vomiting, unspecified: Secondary | ICD-10-CM | POA: Diagnosis not present

## 2018-05-14 DIAGNOSIS — K55069 Acute infarction of intestine, part and extent unspecified: Secondary | ICD-10-CM | POA: Diagnosis not present

## 2018-05-14 DIAGNOSIS — Z7982 Long term (current) use of aspirin: Secondary | ICD-10-CM | POA: Diagnosis not present

## 2018-05-14 DIAGNOSIS — E1165 Type 2 diabetes mellitus with hyperglycemia: Secondary | ICD-10-CM

## 2018-05-14 DIAGNOSIS — E1110 Type 2 diabetes mellitus with ketoacidosis without coma: Secondary | ICD-10-CM | POA: Diagnosis not present

## 2018-05-14 DIAGNOSIS — R05 Cough: Secondary | ICD-10-CM | POA: Diagnosis not present

## 2018-05-14 DIAGNOSIS — D6489 Other specified anemias: Secondary | ICD-10-CM | POA: Diagnosis present

## 2018-05-14 DIAGNOSIS — M7989 Other specified soft tissue disorders: Secondary | ICD-10-CM | POA: Diagnosis not present

## 2018-05-14 DIAGNOSIS — T8189XA Other complications of procedures, not elsewhere classified, initial encounter: Secondary | ICD-10-CM | POA: Diagnosis not present

## 2018-05-14 DIAGNOSIS — E876 Hypokalemia: Secondary | ICD-10-CM | POA: Diagnosis not present

## 2018-05-14 HISTORY — PX: LAPAROTOMY: SHX154

## 2018-05-14 HISTORY — DX: Unspecified intestinal obstruction, unspecified as to partial versus complete obstruction: K56.609

## 2018-05-14 LAB — COMPREHENSIVE METABOLIC PANEL
ALT: 29 U/L (ref 0–44)
AST: 49 U/L — ABNORMAL HIGH (ref 15–41)
Albumin: 2.7 g/dL — ABNORMAL LOW (ref 3.5–5.0)
Alkaline Phosphatase: 78 U/L (ref 38–126)
Anion gap: 12 (ref 5–15)
BILIRUBIN TOTAL: 1.4 mg/dL — AB (ref 0.3–1.2)
BUN: 31 mg/dL — ABNORMAL HIGH (ref 6–20)
CO2: 19 mmol/L — ABNORMAL LOW (ref 22–32)
Calcium: 8.9 mg/dL (ref 8.9–10.3)
Chloride: 106 mmol/L (ref 98–111)
Creatinine, Ser: 1.59 mg/dL — ABNORMAL HIGH (ref 0.44–1.00)
GFR calc Af Amer: 41 mL/min — ABNORMAL LOW (ref >60)
GFR, EST NON AFRICAN AMERICAN: 36 mL/min — AB (ref >60)
Glucose, Bld: 339 mg/dL — ABNORMAL HIGH (ref 70–99)
Potassium: 3.4 mmol/L — ABNORMAL LOW (ref 3.5–5.1)
Sodium: 137 mmol/L (ref 135–145)
Total Protein: 5.1 g/dL — ABNORMAL LOW (ref 6.5–8.1)

## 2018-05-14 LAB — BLOOD GAS, ARTERIAL
Acid-base deficit: 5.7 mmol/L — ABNORMAL HIGH (ref 0.0–2.0)
Bicarbonate: 19.6 mmol/L — ABNORMAL LOW (ref 20.0–28.0)
Drawn by: 51155
FIO2: 80
LHR: 16 {breaths}/min
MECHVT: 450 mL
O2 Saturation: 98.7 %
PEEP: 5 cmH2O
PO2 ART: 249 mmHg — AB (ref 83.0–108.0)
Patient temperature: 100
pCO2 arterial: 42.8 mmHg (ref 32.0–48.0)
pH, Arterial: 7.288 — ABNORMAL LOW (ref 7.350–7.450)

## 2018-05-14 LAB — POCT I-STAT 7, (LYTES, BLD GAS, ICA,H+H)
Acid-base deficit: 3 mmol/L — ABNORMAL HIGH (ref 0.0–2.0)
Acid-base deficit: 7 mmol/L — ABNORMAL HIGH (ref 0.0–2.0)
BICARBONATE: 18.6 mmol/L — AB (ref 20.0–28.0)
Bicarbonate: 21.3 mmol/L (ref 20.0–28.0)
Calcium, Ion: 1.35 mmol/L (ref 1.15–1.40)
Calcium, Ion: 1.21 mmol/L (ref 1.15–1.40)
HCT: 34 % — ABNORMAL LOW (ref 36.0–46.0)
HCT: 31 % — ABNORMAL LOW (ref 36.0–46.0)
Hemoglobin: 11.6 g/dL — ABNORMAL LOW (ref 12.0–15.0)
Hemoglobin: 10.5 g/dL — ABNORMAL LOW (ref 12.0–15.0)
O2 Saturation: 94 %
O2 Saturation: 89 %
Patient temperature: 98.6
Potassium: 3 mmol/L — ABNORMAL LOW (ref 3.5–5.1)
Potassium: 3.1 mmol/L — ABNORMAL LOW (ref 3.5–5.1)
Sodium: 137 mmol/L (ref 135–145)
Sodium: 140 mmol/L (ref 135–145)
TCO2: 22 mmol/L (ref 22–32)
TCO2: 20 mmol/L — ABNORMAL LOW (ref 22–32)
pCO2 arterial: 35.4 mmHg (ref 32.0–48.0)
pCO2 arterial: 35.6 mmHg (ref 32.0–48.0)
pH, Arterial: 7.388 (ref 7.350–7.450)
pH, Arterial: 7.328 — ABNORMAL LOW (ref 7.350–7.450)
pO2, Arterial: 71 mmHg — ABNORMAL LOW (ref 83.0–108.0)
pO2, Arterial: 60 mmHg — ABNORMAL LOW (ref 83.0–108.0)

## 2018-05-14 LAB — URINALYSIS, ROUTINE W REFLEX MICROSCOPIC
Bacteria, UA: NONE SEEN
Bilirubin Urine: NEGATIVE
Glucose, UA: >=500 mg/dL — AB
Ketones, ur: 20 mg/dL — AB
Nitrite: NEGATIVE
Protein, ur: 100 mg/dL — AB
Specific Gravity, Urine: 1.023 (ref 1.005–1.030)
pH: 5 (ref 5.0–8.0)

## 2018-05-14 LAB — LACTIC ACID, PLASMA
Lactic Acid, Venous: 5.2 mmol/L (ref 0.5–1.9)
Lactic Acid, Venous: 1.2 mmol/L (ref 0.5–1.9)
Lactic Acid, Venous: 1.3 mmol/L (ref 0.5–1.9)

## 2018-05-14 LAB — CBC WITH DIFFERENTIAL/PLATELET
Abs Immature Granulocytes: 0 10*3/uL (ref 0.00–0.07)
Abs Immature Granulocytes: 0.3 10*3/uL — ABNORMAL HIGH (ref 0.00–0.07)
BAND NEUTROPHILS: 2 %
BASOS PCT: 0 %
BASOS PCT: 0 %
Basophils Absolute: 0 10*3/uL (ref 0.0–0.1)
Basophils Absolute: 0 10*3/uL (ref 0.0–0.1)
Eosinophils Absolute: 0 10*3/uL (ref 0.0–0.5)
Eosinophils Absolute: 0 10*3/uL (ref 0.0–0.5)
Eosinophils Relative: 0 %
Eosinophils Relative: 0 %
HCT: 41.7 % (ref 36.0–46.0)
HCT: 35.5 % — ABNORMAL LOW (ref 36.0–46.0)
HEMOGLOBIN: 13.2 g/dL (ref 12.0–15.0)
Hemoglobin: 11.4 g/dL — ABNORMAL LOW (ref 12.0–15.0)
Lymphocytes Relative: 11 %
Lymphocytes Relative: 13 %
Lymphs Abs: 2.5 10*3/uL (ref 0.7–4.0)
Lymphs Abs: 1.9 10*3/uL (ref 0.7–4.0)
MCH: 25.9 pg — AB (ref 26.0–34.0)
MCH: 26.6 pg (ref 26.0–34.0)
MCHC: 31.7 g/dL (ref 30.0–36.0)
MCHC: 32.1 g/dL (ref 30.0–36.0)
MCV: 81.9 fL (ref 80.0–100.0)
MCV: 82.8 fL (ref 80.0–100.0)
Monocytes Absolute: 0.9 10*3/uL (ref 0.1–1.0)
Monocytes Absolute: 0.6 10*3/uL (ref 0.1–1.0)
Monocytes Relative: 4 %
Monocytes Relative: 4 %
Myelocytes: 2 %
NRBC: 0 % (ref 0.0–0.2)
Neutro Abs: 19.1 10*3/uL — ABNORMAL HIGH (ref 1.7–7.7)
Neutro Abs: 12 10*3/uL — ABNORMAL HIGH (ref 1.7–7.7)
Neutrophils Relative %: 85 %
Neutrophils Relative %: 79 %
Platelets: 370 10*3/uL (ref 150–400)
Platelets: 256 10*3/uL (ref 150–400)
RBC: 5.09 MIL/uL (ref 3.87–5.11)
RBC: 4.29 MIL/uL (ref 3.87–5.11)
RDW: 14.7 % (ref 11.5–15.5)
RDW: 15 % (ref 11.5–15.5)
WBC: 22.5 10*3/uL — ABNORMAL HIGH (ref 4.0–10.5)
WBC: 14.8 10*3/uL — ABNORMAL HIGH (ref 4.0–10.5)
nRBC: 0 % (ref 0.0–0.2)
nRBC: 0 /100 WBC
nRBC: 0 /100 WBC

## 2018-05-14 LAB — APTT: aPTT: 29 seconds (ref 24–36)

## 2018-05-14 LAB — BASIC METABOLIC PANEL
Anion gap: 21 — ABNORMAL HIGH (ref 5–15)
Anion gap: 12 (ref 5–15)
BUN: 40 mg/dL — ABNORMAL HIGH (ref 6–20)
BUN: 29 mg/dL — AB (ref 6–20)
CO2: 20 mmol/L — ABNORMAL LOW (ref 22–32)
CO2: 18 mmol/L — ABNORMAL LOW (ref 22–32)
Calcium: 9.8 mg/dL (ref 8.9–10.3)
Calcium: 9 mg/dL (ref 8.9–10.3)
Chloride: 89 mmol/L — ABNORMAL LOW (ref 98–111)
Chloride: 107 mmol/L (ref 98–111)
Creatinine, Ser: 2.18 mg/dL — ABNORMAL HIGH (ref 0.44–1.00)
Creatinine, Ser: 1.55 mg/dL — ABNORMAL HIGH (ref 0.44–1.00)
GFR calc Af Amer: 28 mL/min — ABNORMAL LOW (ref >60)
GFR calc Af Amer: 43 mL/min — ABNORMAL LOW (ref >60)
GFR calc non Af Amer: 37 mL/min — ABNORMAL LOW (ref >60)
GFR, EST NON AFRICAN AMERICAN: 24 mL/min — AB (ref >60)
GLUCOSE: 366 mg/dL — AB (ref 70–99)
Glucose, Bld: 711 mg/dL (ref 70–99)
Potassium: 4.6 mmol/L (ref 3.5–5.1)
Potassium: 3.3 mmol/L — ABNORMAL LOW (ref 3.5–5.1)
SODIUM: 130 mmol/L — AB (ref 135–145)
Sodium: 137 mmol/L (ref 135–145)

## 2018-05-14 LAB — CBG MONITORING, ED
GLUCOSE-CAPILLARY: 541 mg/dL — AB (ref 70–99)
Glucose-Capillary: >600 mg/dL (ref 70–99)
Glucose-Capillary: 599 mg/dL (ref 70–99)
Glucose-Capillary: 559 mg/dL (ref 70–99)
Glucose-Capillary: >600 mg/dL (ref 70–99)
Glucose-Capillary: 400 mg/dL — ABNORMAL HIGH (ref 70–99)

## 2018-05-14 LAB — POCT I-STAT EG7
Acid-Base Excess: 3 mmol/L — ABNORMAL HIGH (ref 0.0–2.0)
Acid-base deficit: 2 mmol/L (ref 0.0–2.0)
Bicarbonate: 26.7 mmol/L (ref 20.0–28.0)
Bicarbonate: 25.7 mmol/L (ref 20.0–28.0)
CALCIUM ION: 1.19 mmol/L (ref 1.15–1.40)
Calcium, Ion: 1.11 mmol/L — ABNORMAL LOW (ref 1.15–1.40)
HCT: 43 % (ref 36.0–46.0)
HCT: 40 % (ref 36.0–46.0)
Hemoglobin: 14.6 g/dL (ref 12.0–15.0)
Hemoglobin: 13.6 g/dL (ref 12.0–15.0)
O2 Saturation: 96 %
O2 Saturation: 98 %
POTASSIUM: 5.2 mmol/L — AB (ref 3.5–5.1)
Potassium: 4.1 mmol/L (ref 3.5–5.1)
Sodium: 129 mmol/L — ABNORMAL LOW (ref 135–145)
Sodium: 134 mmol/L — ABNORMAL LOW (ref 135–145)
TCO2: 28 mmol/L (ref 22–32)
TCO2: 27 mmol/L (ref 22–32)
pCO2, Ven: 35.7 mmHg — ABNORMAL LOW (ref 44.0–60.0)
pCO2, Ven: 54.4 mmHg (ref 44.0–60.0)
pH, Ven: 7.481 — ABNORMAL HIGH (ref 7.250–7.430)
pH, Ven: 7.283 (ref 7.250–7.430)
pO2, Ven: 76 mmHg — ABNORMAL HIGH (ref 32.0–45.0)
pO2, Ven: 127 mmHg — ABNORMAL HIGH (ref 32.0–45.0)

## 2018-05-14 LAB — GLUCOSE, CAPILLARY
Glucose-Capillary: 332 mg/dL — ABNORMAL HIGH (ref 70–99)
Glucose-Capillary: 332 mg/dL — ABNORMAL HIGH (ref 70–99)
Glucose-Capillary: 345 mg/dL — ABNORMAL HIGH (ref 70–99)
Glucose-Capillary: 349 mg/dL — ABNORMAL HIGH (ref 70–99)
Glucose-Capillary: 356 mg/dL — ABNORMAL HIGH (ref 70–99)

## 2018-05-14 LAB — POCT I-STAT 4, (NA,K, GLUC, HGB,HCT)
GLUCOSE: 298 mg/dL — AB (ref 70–99)
HCT: 36 % (ref 36.0–46.0)
Hemoglobin: 12.2 g/dL (ref 12.0–15.0)
Potassium: 3.6 mmol/L (ref 3.5–5.1)
Sodium: 139 mmol/L (ref 135–145)

## 2018-05-14 LAB — TYPE AND SCREEN
ABO/RH(D): O POS
Antibody Screen: NEGATIVE

## 2018-05-14 LAB — I-STAT BETA HCG BLOOD, ED (MC, WL, AP ONLY): I-stat hCG, quantitative: 28.5 m[IU]/mL — ABNORMAL HIGH (ref <5)

## 2018-05-14 LAB — PROTIME-INR
INR: 1.44
Prothrombin Time: 17.4 seconds — ABNORMAL HIGH (ref 11.4–15.2)

## 2018-05-14 LAB — BRAIN NATRIURETIC PEPTIDE: B Natriuretic Peptide: 118.7 pg/mL — ABNORMAL HIGH (ref 0.0–100.0)

## 2018-05-14 LAB — BETA-HYDROXYBUTYRIC ACID: Beta-Hydroxybutyric Acid: 2.44 mmol/L — ABNORMAL HIGH (ref 0.05–0.27)

## 2018-05-14 LAB — CORTISOL: Cortisol, Plasma: 55.8 ug/dL

## 2018-05-14 LAB — ABO/RH: ABO/RH(D): O POS

## 2018-05-14 LAB — PHOSPHORUS: Phosphorus: 2.5 mg/dL (ref 2.5–4.6)

## 2018-05-14 LAB — MAGNESIUM: Magnesium: 1.8 mg/dL (ref 1.7–2.4)

## 2018-05-14 LAB — LIPASE, BLOOD: Lipase: 37 U/L (ref 11–51)

## 2018-05-14 LAB — AMYLASE: Amylase: 60 U/L (ref 28–100)

## 2018-05-14 LAB — PROCALCITONIN: Procalcitonin: 25.12 ng/mL

## 2018-05-14 SURGERY — LAPAROTOMY, EXPLORATORY
Anesthesia: General | Site: Abdomen

## 2018-05-14 MED ORDER — POTASSIUM CHLORIDE 10 MEQ/100ML IV SOLN
10.0000 meq | INTRAVENOUS | Status: DC
  Administered 2018-05-14: 10 meq via INTRAVENOUS
  Filled 2018-05-14: qty 100

## 2018-05-14 MED ORDER — SODIUM CHLORIDE 0.9 % IV SOLN: INTRAVENOUS | Status: AC

## 2018-05-14 MED ORDER — DEXTROSE-NACL 5-0.45 % IV SOLN
INTRAVENOUS | Status: DC
  Administered 2018-05-15 (×2): via INTRAVENOUS

## 2018-05-14 MED ORDER — ROCURONIUM BROMIDE 10 MG/ML (PF) SYRINGE
PREFILLED_SYRINGE | INTRAVENOUS | Status: DC | PRN
  Administered 2018-05-14: 30 mg via INTRAVENOUS
  Administered 2018-05-14: 50 mg via INTRAVENOUS

## 2018-05-14 MED ORDER — SODIUM CHLORIDE 0.9 % IV SOLN
INTRAVENOUS | Status: DC
  Administered 2018-05-14 (×3): via INTRAVENOUS

## 2018-05-14 MED ORDER — DEXMEDETOMIDINE HCL IN NACL 200 MCG/50ML IV SOLN
0.0000 ug/kg/h | INTRAVENOUS | Status: DC
  Filled 2018-05-14: qty 50

## 2018-05-14 MED ORDER — POTASSIUM CHLORIDE 10 MEQ/100ML IV SOLN: 10.0000 meq | INTRAVENOUS | Status: AC

## 2018-05-14 MED ORDER — SODIUM CHLORIDE 0.9 % IV SOLN: INTRAVENOUS | Status: DC

## 2018-05-14 MED ORDER — ORAL CARE MOUTH RINSE
15.0000 mL | OROMUCOSAL | Status: DC
  Administered 2018-05-14 – 2018-05-19 (×47): 15 mL via OROMUCOSAL

## 2018-05-14 MED ORDER — LACTATED RINGERS IV SOLN
INTRAVENOUS | Status: DC | PRN
  Administered 2018-05-14 (×2): via INTRAVENOUS

## 2018-05-14 MED ORDER — ONDANSETRON HCL 4 MG/2ML IJ SOLN
INTRAMUSCULAR | Status: DC | PRN
  Administered 2018-05-14: 4 mg via INTRAVENOUS

## 2018-05-14 MED ORDER — PROPOFOL 500 MG/50ML IV EMUL
INTRAVENOUS | Status: DC | PRN
  Administered 2018-05-14: 50 ug/kg/min via INTRAVENOUS

## 2018-05-14 MED ORDER — SODIUM CHLORIDE 0.9 % IV SOLN
200.0000 mg | Freq: Once | INTRAVENOUS | Status: AC
  Administered 2018-05-14: 200 mg via INTRAVENOUS
  Filled 2018-05-14: qty 200

## 2018-05-14 MED ORDER — CHLORHEXIDINE GLUCONATE 0.12% ORAL RINSE (MEDLINE KIT)
15.0000 mL | Freq: Two times a day (BID) | OROMUCOSAL | Status: DC
  Administered 2018-05-14 – 2018-05-19 (×10): 15 mL via OROMUCOSAL

## 2018-05-14 MED ORDER — PROPOFOL 10 MG/ML IV BOLUS
INTRAVENOUS | Status: DC | PRN
  Administered 2018-05-14 (×2): 20 mg via INTRAVENOUS

## 2018-05-14 MED ORDER — INSULIN REGULAR(HUMAN) IN NACL 100-0.9 UT/100ML-% IV SOLN
INTRAVENOUS | Status: DC
  Administered 2018-05-14: 2.7 [IU]/h via INTRAVENOUS
  Filled 2018-05-14: qty 100

## 2018-05-14 MED ORDER — SODIUM CHLORIDE 0.9 % IV BOLUS
1000.0000 mL | Freq: Once | INTRAVENOUS | Status: AC
  Administered 2018-05-14: 1000 mL via INTRAVENOUS

## 2018-05-14 MED ORDER — FENTANYL CITRATE (PF) 250 MCG/5ML IJ SOLN
INTRAMUSCULAR | Status: AC
  Filled 2018-05-14: qty 5

## 2018-05-14 MED ORDER — PHENYLEPHRINE HCL 10 MG/ML IJ SOLN
INTRAMUSCULAR | Status: DC | PRN
  Administered 2018-05-14: 80 ug via INTRAVENOUS

## 2018-05-14 MED ORDER — VANCOMYCIN VARIABLE DOSE PER UNSTABLE RENAL FUNCTION (PHARMACIST DOSING): Status: DC

## 2018-05-14 MED ORDER — METRONIDAZOLE IN NACL 5-0.79 MG/ML-% IV SOLN
500.0000 mg | Freq: Three times a day (TID) | INTRAVENOUS | Status: DC
  Administered 2018-05-14 – 2018-05-19 (×15): 500 mg via INTRAVENOUS
  Filled 2018-05-14 (×15): qty 100

## 2018-05-14 MED ORDER — INSULIN REGULAR(HUMAN) IN NACL 100-0.9 UT/100ML-% IV SOLN
INTRAVENOUS | Status: DC
  Administered 2018-05-14: 5.4 [IU]/h via INTRAVENOUS
  Filled 2018-05-14: qty 100

## 2018-05-14 MED ORDER — SODIUM CHLORIDE 0.9 % IV SOLN
2.0000 g | Freq: Once | INTRAVENOUS | Status: AC
  Administered 2018-05-14: 2 g via INTRAVENOUS
  Filled 2018-05-14: qty 2

## 2018-05-14 MED ORDER — FENTANYL BOLUS VIA INFUSION
50.0000 ug | INTRAVENOUS | Status: DC | PRN
  Administered 2018-05-15 – 2018-05-16 (×6): 50 ug via INTRAVENOUS
  Administered 2018-05-16: 100 ug via INTRAVENOUS
  Administered 2018-05-17: 50 ug via INTRAVENOUS
  Filled 2018-05-14 (×2): qty 50

## 2018-05-14 MED ORDER — POTASSIUM CHLORIDE 10 MEQ/50ML IV SOLN
10.0000 meq | INTRAVENOUS | Status: AC
  Administered 2018-05-14 – 2018-05-15 (×4): 10 meq via INTRAVENOUS
  Filled 2018-05-14 (×4): qty 50

## 2018-05-14 MED ORDER — FENTANYL CITRATE (PF) 100 MCG/2ML IJ SOLN: 50.0000 ug | Freq: Once | INTRAMUSCULAR | Status: DC

## 2018-05-14 MED ORDER — SODIUM CHLORIDE 0.9 % IV SOLN
INTRAVENOUS | Status: DC
  Administered 2018-05-14 – 2018-05-18 (×4): via INTRAVENOUS

## 2018-05-14 MED ORDER — SODIUM CHLORIDE 0.9 % IV SOLN
1.0000 g | INTRAVENOUS | Status: DC
  Administered 2018-05-15: 1 g via INTRAVENOUS
  Filled 2018-05-14: qty 1

## 2018-05-14 MED ORDER — LIDOCAINE 2% (20 MG/ML) 5 ML SYRINGE
INTRAMUSCULAR | Status: DC | PRN
  Administered 2018-05-14: 60 mg via INTRAVENOUS

## 2018-05-14 MED ORDER — SODIUM CHLORIDE 0.9 % IV BOLUS
1000.0000 mL | Freq: Once | INTRAVENOUS | Status: AC
  Administered 2018-05-15: 1000 mL via INTRAVENOUS

## 2018-05-14 MED ORDER — 0.9 % SODIUM CHLORIDE (POUR BTL) OPTIME
TOPICAL | Status: DC | PRN
  Administered 2018-05-14: 1000 mL

## 2018-05-14 MED ORDER — ONDANSETRON HCL 4 MG/2ML IJ SOLN
INTRAMUSCULAR | Status: AC
  Filled 2018-05-14: qty 2

## 2018-05-14 MED ORDER — LIDOCAINE 2% (20 MG/ML) 5 ML SYRINGE
INTRAMUSCULAR | Status: AC
  Filled 2018-05-14: qty 10

## 2018-05-14 MED ORDER — ROCURONIUM BROMIDE 50 MG/5ML IV SOSY
PREFILLED_SYRINGE | INTRAVENOUS | Status: AC
  Filled 2018-05-14: qty 10

## 2018-05-14 MED ORDER — FENTANYL CITRATE (PF) 250 MCG/5ML IJ SOLN
INTRAMUSCULAR | Status: DC | PRN
  Administered 2018-05-14 (×3): 50 ug via INTRAVENOUS
  Administered 2018-05-14: 100 ug via INTRAVENOUS

## 2018-05-14 MED ORDER — ALBUMIN HUMAN 5 % IV SOLN
INTRAVENOUS | Status: DC | PRN
  Administered 2018-05-14 (×2): via INTRAVENOUS

## 2018-05-14 MED ORDER — SODIUM CHLORIDE 0.9 % IV SOLN
100.0000 mg | INTRAVENOUS | Status: DC
  Administered 2018-05-15: 100 mg via INTRAVENOUS
  Filled 2018-05-14: qty 100

## 2018-05-14 MED ORDER — ONDANSETRON HCL 4 MG/2ML IJ SOLN
4.0000 mg | Freq: Once | INTRAMUSCULAR | Status: AC
  Administered 2018-05-14: 4 mg via INTRAVENOUS
  Filled 2018-05-14: qty 2

## 2018-05-14 MED ORDER — SUCCINYLCHOLINE CHLORIDE 20 MG/ML IJ SOLN
INTRAMUSCULAR | Status: DC | PRN
  Administered 2018-05-14: 100 mg via INTRAVENOUS

## 2018-05-14 MED ORDER — FENTANYL 2500MCG IN NS 250ML (10MCG/ML) PREMIX INFUSION
25.0000 ug/h | INTRAVENOUS | Status: DC
  Administered 2018-05-14: 50 ug/h via INTRAVENOUS
  Administered 2018-05-15 (×2): 200 ug/h via INTRAVENOUS
  Administered 2018-05-16: 300 ug/h via INTRAVENOUS
  Administered 2018-05-16: 250 ug/h via INTRAVENOUS
  Administered 2018-05-16 – 2018-05-17 (×2): 350 ug/h via INTRAVENOUS
  Filled 2018-05-14 (×7): qty 250

## 2018-05-14 MED ORDER — VANCOMYCIN HCL 10 G IV SOLR
2000.0000 mg | Freq: Once | INTRAVENOUS | Status: DC
  Filled 2018-05-14: qty 2000

## 2018-05-14 MED ORDER — MORPHINE SULFATE (PF) 4 MG/ML IV SOLN
4.0000 mg | Freq: Once | INTRAVENOUS | Status: AC
  Administered 2018-05-14: 4 mg via INTRAVENOUS
  Filled 2018-05-14: qty 1

## 2018-05-14 SURGICAL SUPPLY — 45 items
BENZOIN TINCTURE PRP APPL 2/3 (GAUZE/BANDAGES/DRESSINGS) ×3 IMPLANT
BLADE CLIPPER SURG (BLADE) IMPLANT
CANISTER SUCT 3000ML PPV (MISCELLANEOUS) ×3 IMPLANT
CANISTER WOUNDNEG PRESSURE 500 (CANNISTER) ×3 IMPLANT
CHLORAPREP W/TINT 26ML (MISCELLANEOUS) ×3 IMPLANT
COVER SURGICAL LIGHT HANDLE (MISCELLANEOUS) ×3 IMPLANT
COVER WAND RF STERILE (DRAPES) ×3 IMPLANT
DRAPE LAPAROSCOPIC ABDOMINAL (DRAPES) ×3 IMPLANT
DRAPE WARM FLUID 44X44 (DRAPE) ×3 IMPLANT
DRSG OPSITE POSTOP 4X10 (GAUZE/BANDAGES/DRESSINGS) IMPLANT
DRSG OPSITE POSTOP 4X8 (GAUZE/BANDAGES/DRESSINGS) IMPLANT
ELECT BLADE 6.5 EXT (BLADE) IMPLANT
ELECT CAUTERY BLADE 6.4 (BLADE) ×3 IMPLANT
ELECT REM PT RETURN 9FT ADLT (ELECTROSURGICAL) ×3
ELECTRODE REM PT RTRN 9FT ADLT (ELECTROSURGICAL) ×1 IMPLANT
GLOVE BIO SURGEON STRL SZ8 (GLOVE) ×3 IMPLANT
GLOVE BIOGEL PI IND STRL 8 (GLOVE) ×1 IMPLANT
GLOVE BIOGEL PI INDICATOR 8 (GLOVE) ×2
GOWN PREVENTION PLUS LG XLONG (DISPOSABLE) ×3 IMPLANT
GOWN STRL REUS W/ TWL LRG LVL3 (GOWN DISPOSABLE) ×1 IMPLANT
GOWN STRL REUS W/ TWL XL LVL3 (GOWN DISPOSABLE) ×1 IMPLANT
GOWN STRL REUS W/TWL LRG LVL3 (GOWN DISPOSABLE) ×2
GOWN STRL REUS W/TWL XL LVL3 (GOWN DISPOSABLE) ×2
KIT BASIN OR (CUSTOM PROCEDURE TRAY) ×3 IMPLANT
KIT TURNOVER KIT B (KITS) ×3 IMPLANT
LIGASURE IMPACT 36 18CM CVD LR (INSTRUMENTS) ×3 IMPLANT
NS IRRIG 1000ML POUR BTL (IV SOLUTION) ×6 IMPLANT
PACK GENERAL/GYN (CUSTOM PROCEDURE TRAY) ×3 IMPLANT
PAD ARMBOARD 7.5X6 YLW CONV (MISCELLANEOUS) ×3 IMPLANT
PENCIL SMOKE EVACUATOR (MISCELLANEOUS) ×3 IMPLANT
RELOAD PROXIMATE 75MM BLUE (ENDOMECHANICALS) ×3 IMPLANT
SPECIMEN JAR LARGE (MISCELLANEOUS) IMPLANT
SPONGE ABD ABTHERA ADVANCE (MISCELLANEOUS) ×3 IMPLANT
SPONGE LAP 18X18 X RAY DECT (DISPOSABLE) IMPLANT
STAPLER PROXIMATE 75MM BLUE (STAPLE) ×3 IMPLANT
STAPLER VISISTAT 35W (STAPLE) ×3 IMPLANT
SUCTION POOLE TIP (SUCTIONS) ×3 IMPLANT
SUT PDS AB 1 TP1 96 (SUTURE) ×6 IMPLANT
SUT SILK 2 0 SH CR/8 (SUTURE) ×3 IMPLANT
SUT SILK 2 0 TIES 10X30 (SUTURE) ×3 IMPLANT
SUT SILK 3 0 SH CR/8 (SUTURE) ×3 IMPLANT
SUT SILK 3 0 TIES 10X30 (SUTURE) ×3 IMPLANT
TOWEL OR 17X26 10 PK STRL BLUE (TOWEL DISPOSABLE) ×3 IMPLANT
TRAY FOLEY MTR SLVR 16FR STAT (SET/KITS/TRAYS/PACK) IMPLANT
YANKAUER SUCT BULB TIP NO VENT (SUCTIONS) IMPLANT

## 2018-05-14 NOTE — Anesthesia Procedure Notes (Signed)
Arterial Line Insertion Start/End1/27/2020 6:15 PM, 05/14/2018 6:23 PM Performed by: Shirlyn Goltz, CRNA, CRNA  Patient location: OR. Preanesthetic checklist: patient identified, IV checked, monitors and equipment checked and pre-op evaluation Emergency situation Patient sedated Left, radial was placed Catheter size: 20 G  Attempts: 1 Procedure performed without using ultrasound guided technique. Following insertion, Biopatch and dressing applied. Post procedure assessment: normal  Patient tolerated the procedure well with no immediate complications.

## 2018-05-14 NOTE — Progress Notes (Signed)
[place] Progress Note Patient Name: Rayvon Brandvold DOB: 1960/08/16 MRN: [id number]   Date of Service  05/14/2018  HPI/Events of Note  K+ = 3.3 and Creatinine = 1.55.   eICU Interventions  Will replace K+.     Intervention Category Major Interventions: Electrolyte abnormality - evaluation and management  Sommer,Steven Eugene 05/14/2018, 11:14 PM

## 2018-05-14 NOTE — Op Note (Signed)
05/14/2018  7:24 PM  PATIENT:  Wendy [name]  58 y.o. female  PRE-OPERATIVE DIAGNOSIS:  ischemic bowel  POST-OPERATIVE DIAGNOSIS: Small bowel intussusception distal ileum with necrosis and perforation  PROCEDURE:  Procedure(s): EXPLORATORY LAPAROTOMY SMALL BOWEL RESECTION CLOSURE WITH ABTHERA VAC  SURGEON:  Surgeon(s): Georganna Skeans, MD  ASSISTANTS: none   ANESTHESIA:   general  EBL:  Total I/O In: 1500 [I.V.:1000; IV Piggyback:500] Out: 100 [Blood:100]  BLOOD ADMINISTERED:none  DRAINS: Abthera VAC   SPECIMEN:  Excision  DISPOSITION OF SPECIMEN:  PATHOLOGY  COUNTS:  YES  DICTATION: .Dragon Dictation Findings: Distal ileum small bowel intussusception with necrosis and perforation.  Procedure in detail: [name] is brought for emergency exploratory laparotomy for suspected dead bowel.  I obtained telephone consent from her husband.  She received broad-spectrum antibiotics.  She was brought to the operating room and general endotracheal anesthesia was administered by the anesthesia staff.  They also placed arterial line and central venous catheter.  Foley catheter was placed by nursing.  Her abdomen was prepped and draped in a sterile fashion.  We did a timeout procedure.  Midline incision was made.  Subcutaneous tissues were dissected down to the fascia.  This was divided along the midline.  Peritoneal cavity was entered revealing some some foul-smelling fluid.  Cultures were sent.  Exploration revealed dilated proximal bowel leading to a small bowel intussusception with necrosis and perforation.  This was about 20 cm from the ileocecal valve.  The normal small bowel distal to this was divided with GIA-75 stapler.  Dilated but otherwise normal bowel proximal to it was divided with GIA-75 stapler.  The mesentery was taken down with LigaSure.  Specimen was sent to pathology.  Hemostasis was obtained with some 3 0 silks along the staple line.  The abdomen was copiously irrigated  with multiple liters of saline.  Due to her blood glucose of 700 and sepsis, decision was made to leave her in discontinuity.  Nasogastric tube was adjusted in the stomach and evacuated 2 L.  I then placed an AB Thera open abdomen VAC in standard fashion.  The inner drape was tucked around the bowel thoroughly.  2 blue sponges were fashioned and placed on top.  VAC drapes were placed after prepping the skin with benzoin.  It was hooked up to the [facility] device and an excellent seal was obtained.  All counts were correct.  She will be taken directly to the intensive care unit in critical condition on the ventilator with plans for return to the operating room in 2 days.  There were no apparent complications. PATIENT DISPOSITION:  ICU - intubated and critically ill.   Delay start of Pharmacological VTE agent (>24hrs) due to surgical blood loss or risk of bleeding:  no  Georganna Skeans, MD, MPH, FACS Pager: [phone]  1/27/20207:24 PM

## 2018-05-14 NOTE — Consult Note (Signed)
Medical Consultation for unassigned patient   Wendy [name]  BLT:[id number]  DOB: [birth year]  DOA: 05/14/2018  PCP: Steele Sizer, MD   Outpatient Specialists:    Requesting physician: Dr. Andy Gauss, emergency department resident  Reason for consultation: DKA   History of Present Illness: Wendy [name] is an 58 y.o. female the past medical history significant for diabetes type 2 poorly controlled, diabetic retinopathy, primary hypertension, obesity, B12 and vitamin D deficiency who presented to the emergency department for diffuse abdominal pain.  Her symptoms started 3 days ago.  She has had multiple episodes of nonbloody nonbilious emesis.  She has been unable to eat anything for 3 days.  She is had associated polyuria and dyspnea and reports a prior to have the symptoms she was at her baseline.  She was supposed to be taking [place] but stopped taking it due to generalized pain.  She is also taking Synjardy (empagliflozin/metformin).  Patient denied any chest pain, palpitations, shortness of breath headache or dizziness fevers or chills when she spoke to Dr. Andy Gauss initially.  By the time I saw her the patient was confused and unable to answer any questions.  Her mental status has significantly deteriorated.  She was seen in CT scan and found to have a small bowel obstruction with pneumoperitoneum.  Is also found to be in DKA with an anion gap of 21 and a bicarb of 21.  Her white blood cell count elevated at 22.  She has a tender abdomen on examination. Was initially consulted for admission but after evaluating her Dr. Wilson Singer the ED attending felt that the patient has deteriorated to the point where she needs critical care.  Critical care is being consulted.  Review of Systems:  She is unable to answer any questions to medical condition  Past Medical History: Past Medical History:  Diagnosis Date  . Acute eczema   . Allergy   . Anemia   . Anxiety   . Chronic constipation  with overflow   . Diabetes mellitus without complication ([place])   . GERD (gastroesophageal reflux disease)   . Herpetic vesicle in vagina   . Hyperlipidemia   . Hypertension   . Leg edema   . Morbid obesity ([place])   . Peri-menopause   . Snoring     Past Surgical History: Past Surgical History:  Procedure Laterality Date  . CESAREAN SECTION  1993  . tonsillectomy and adnoidectomy    . WISDOM TOOTH EXTRACTION       Allergies:   Allergies  Allergen Reactions  . Penicillins Swelling    Did it involve swelling of the face/tongue/throat, SOB, or low BP? Yes Did it involve sudden or severe rash/hives, skin peeling, or any reaction on the inside of your mouth or nose? No Did you need to seek medical attention at a hospital or doctor's office? Yes When did it last happen? unknown If all above answers are "NO", may proceed with cephalosporin use.      Social History:  reports that she has been smoking cigarettes. She started smoking about 47 years ago. She has smoked for the past 20.00 years. She has never used smokeless tobacco. She reports that she does not drink alcohol or use drugs.   Family History: Family History  Problem Relation Age of Onset  . Diabetes Mother   . Asthma Mother   . Heart disease Mother   . Heart disease Father   .  Prostate cancer Father   . Other Father        blockages in stomach  . Kidney disease Paternal Grandmother   . Asthma Son   . Autism Son   . Breast cancer Neg Hx   . Colon cancer Neg Hx   . Esophageal cancer Neg Hx   . Pancreatic cancer Neg Hx   . Rectal cancer Neg Hx   . Stomach cancer Neg Hx      Physical Exam: Vitals:   05/14/18 1600 05/14/18 1615 05/14/18 1630 05/14/18 1645  BP: 117/72 (!) 138/91 (!) 143/94 120/80  Pulse: (!) 133 (!) 136 (!) 135 (!) 139  Resp: (!) 39 (!) 34 (!) 34 (!) 29  Temp:      TempSrc:      SpO2: 99% 100% 100% 100%  Weight:      Height:        Constitutional: Acutely ill-appearing, confused and  lethargic not answering any questions Eyes: PERLA, EOMI, irises appear normal, anicteric sclera,  ENMT: external ears and nose appear normal,             Lips appears normal, oropharynx mucosa, tongue, posterior pharynx appear normal  Neck: neck appears normal, no masses, normal ROM, no thyromegaly, no JVD  CVS: S1-S2 clear, no murmur rubs or gallops, no LE edema, normal pedal pulses  Respiratory:  clear to auscultation bilaterally, no wheezing, rales or rhonchi. Respiratory effort normal. No accessory muscle use.  Abdomen: Firm, diffusely tender, nondistended, absent bowel sounds, no hepatosplenomegaly, no hernias  Musculoskeletal: : no cyanosis, clubbing or edema noted bilaterally Neuro: Cannot not follow commands not alert or oriented Psych: Not answering questions lethargic Skin: no rashes or lesions or ulcers, no induration or nodules   Data reviewed:  I have personally reviewed following labs and imaging studies Labs:  CBC: Recent Labs  Lab 05/14/18 1133 05/14/18 1143 05/14/18 1641  WBC 22.5*  --   --   NEUTROABS 19.1*  --   --   HGB 13.2 14.6 13.6  HCT 41.7 43.0 40.0  MCV 81.9  --   --   PLT 370  --   --     Basic Metabolic Panel: Recent Labs  Lab 05/14/18 1133 05/14/18 1143 05/14/18 1641  NA 130* 129* 134*  K 4.6 5.2* 4.1  CL 89*  --   --   CO2 20*  --   --   GLUCOSE 711*  --   --   BUN 40*  --   --   CREATININE 2.18*  --   --   CALCIUM 9.8  --   --    GFR Estimated Creatinine Clearance: 34.5 mL/min (A) (by C-G formula based on SCr of 2.18 mg/dL (H)). Liver Function Tests: No results for input(s): AST, ALT, ALKPHOS, BILITOT, PROT, ALBUMIN in the last 168 hours. No results for input(s): LIPASE, AMYLASE in the last 168 hours. No results for input(s): AMMONIA in the last 168 hours. Coagulation profile No results for input(s): INR, PROTIME in the last 168 hours.  Cardiac Enzymes: No results for input(s): CKTOTAL, CKMB, CKMBINDEX, TROPONINI in the last 168  hours. BNP: Invalid input(s): POCBNP CBG: Recent Labs  Lab 05/14/18 1427 05/14/18 1531 05/14/18 1533 05/14/18 1537 05/14/18 1639  GLUCAP 599* >600* 559* 541* 400*   D-Dimer No results for input(s): DDIMER in the last 72 hours. Hgb A1c No results for input(s): HGBA1C in the last 72 hours. Lipid Profile No results for input(s): CHOL, HDL, LDLCALC,  TRIG, CHOLHDL, LDLDIRECT in the last 72 hours. Thyroid function studies No results for input(s): TSH, T4TOTAL, T3FREE, THYROIDAB in the last 72 hours.  Invalid input(s): FREET3 Anemia work up No results for input(s): VITAMINB12, FOLATE, FERRITIN, TIBC, IRON, RETICCTPCT in the last 72 hours. Urinalysis No results found for: COLORURINE, APPEARANCEUR, LABSPEC, [place], GLUCOSEU, HGBUR, BILIRUBINUR, KETONESUR, PROTEINUR, UROBILINOGEN, NITRITE, [place]   Microbiology No results found for this or any previous visit (from the past 240 hour(s)).     Inpatient Medications:   Scheduled Meds: Continuous Infusions: . sodium chloride 125 mL/hr at 05/14/18 1434  . [START ON 05/15/2018] ceFEPime (MAXIPIME) IV    . insulin 10.2 Units/hr (05/14/18 1645)  . metronidazole Stopped (05/14/18 1649)   EKG: Personally evaluated by me shows sinus tachycardia with ventricular premature complexes, right axis deviation, when compared with March 27, 2018 there is no change.  Radiological Exams on Admission: Dg Chest Portable 1 View  Result Date: 05/14/2018 CLINICAL DATA:  Abdominal pain for several days EXAM: PORTABLE CHEST 1 VIEW COMPARISON:  CT from earlier in the same day. FINDINGS: Elevation of the right hemidiaphragm is again seen. Cardiac shadow is within normal limits. Mild right basilar atelectatic changes are noted. No sizable effusion is seen. No bony abnormality is noted. IMPRESSION: Mild right basilar atelectasis. Electronically Signed   By: Inez Catalina M.D.   On: 05/14/2018 15:26    Impression/Recommendations Principal Problem:    Small bowel obstruction (HCC) Active Problems:   DKA (diabetic ketoacidoses) ([place])   Uncontrolled type 2 diabetes mellitus with nonproliferative retinopathy ([place])   Essential (primary) hypertension   Extreme obesity   Mild nonproliferative diabetic retinopathy of both eyes associated with type 2 diabetes mellitus (HCC)  Impression: Critically ill 58 year old female with diabetes, DKA, and small bowel obstruction.  Her mentation is extraordinarily poor and she will be seen by critical care due to the severity of her current illnesses  1.  Small bowel obstruction: Patient with pneumoperitoneum.  This is very serious.  Surgery is being consulted and is in the process of seeing the patient.  At the very least she will need an NG tube if not surgery.  2.  Diabetic ketoacidosis: Fairly severe with glucoses in the 700 range she is currently on IV insulin protocol and will be admitted to the ICU.  3.  Uncontrolled type 2 diabetes mellitus with nonproliferative retinopathy: Noted patient will need better diabetes management as an outpatient.  She was unable to tolerate [place] she said it made her ache all over.  Would likely need a different medication formula.  4.  Essential primary hypertension: Blood pressures are acceptable.  We will continue to monitor.  5.  Extreme obesity: Noted diet recommendations by nutrition would be helpful for the patient.  6.  Critical illness: Patient being referred to critical care by Dr. Wilson Singer.  Would be happy to take her over once echo issues resolved.   Thank you for this consultation.  Our [facility] hospitalist team will follow the patient with you.   Time Spent: 30 minutes  Lady Deutscher M.D. [facility] 05/14/2018, 5:01 PM

## 2018-05-14 NOTE — ED Notes (Signed)
CBG 400 RN noitified

## 2018-05-14 NOTE — Anesthesia Postprocedure Evaluation (Signed)
Anesthesia Post Note  Patient: Wendy [name]  Procedure(s) Performed: EXPLORATORY LAPAROTOMY (N/A Abdomen)     Patient location during evaluation: SICU Anesthesia Type: General Level of consciousness: sedated Pain management: pain level controlled Vital Signs Assessment: post-procedure vital signs reviewed and stable Respiratory status: patient remains intubated per anesthesia plan Cardiovascular status: stable Postop Assessment: no apparent nausea or vomiting Anesthetic complications: no    Last Vitals:  Vitals:   05/14/18 1945 05/14/18 2015  BP:    Pulse:    Resp:    Temp:  37.8 C  SpO2: 99%     Last Pain:  Vitals:   05/14/18 2015  TempSrc: Oral  PainSc:                  Aerika Groll

## 2018-05-14 NOTE — ED Notes (Signed)
MD to bedside to reassess pt declining status. More lethargic and less responsive. MD to consult critical care service.

## 2018-05-14 NOTE — Anesthesia Procedure Notes (Signed)
Procedure Name: Intubation Date/Time: 05/14/2018 6:19 PM Performed by: Myna Bright, CRNA Pre-anesthesia Checklist: Patient identified, Emergency Drugs available, Suction available and Patient being monitored Patient Re-evaluated:Patient Re-evaluated prior to induction Oxygen Delivery Method: Circle system utilized Preoxygenation: Pre-oxygenation with 100% oxygen Induction Type: IV induction, Rapid sequence and Cricoid Pressure applied Laryngoscope Size: Mac and 3 Grade View: Grade II Tube type: Subglottic suction tube Tube size: 7.5 mm Number of attempts: 1 Airway Equipment and Method: Stylet Placement Confirmation: ETT inserted through vocal cords under direct vision,  positive ETCO2 and breath sounds checked- equal and bilateral Secured at: 21 cm Tube secured with: Tape Dental Injury: Teeth and Oropharynx as per pre-operative assessment

## 2018-05-14 NOTE — ED Provider Notes (Signed)
[facility] EMERGENCY DEPARTMENT Provider Note   CSN: [id number] Arrival date & time: 05/14/18  1103     History   Chief Complaint Chief Complaint  Patient presents with  . Abdominal Pain  . Hyperglycemia    HPI Wendy [name] is a 58 y.o. female with a past medical history significant for type 2 diabetes, hypertension, hyperlipidemia, GERD who presents today complaining of severe diffuse abdominal pain.  Patient reports her symptoms started 3 days ago.  She has had multiple episodes of nonbloody nonbilious vomiting.  She has not been able to eat anything for the past 3 days. Patient also endorsed polyuria and for dyspnea. Patient reports that prior to having her symptoms she was at her baseline.  Patient reports that she has not taken her Tyler Aas in a few days due to generalized pain.  She is supposed to be on Tresiba 22 units daily.  Patient is also on Synjardy (empagliflozin-metformin).  Patient denies any chest pain, palpitation, shortness of breath, headache, dizziness, fever, chills.  HPI  Past Medical History:  Diagnosis Date  . Acute eczema   . Allergy   . Anemia   . Anxiety   . Chronic constipation with overflow   . Diabetes mellitus without complication ([place])   . GERD (gastroesophageal reflux disease)   . Herpetic vesicle in vagina   . Hyperlipidemia   . Hypertension   . Leg edema   . Morbid obesity ([place])   . Peri-menopause   . Snoring     Patient Active Problem List   Diagnosis Date Noted  . Small bowel obstruction ([place]) 05/14/2018  . Endometrial polyp 11/29/2017  . Fibroid 11/29/2017  . Post-menopausal bleeding 11/14/2017  . Mild nonproliferative diabetic retinopathy of both eyes associated with type 2 diabetes mellitus ([place]) 06/09/2016  . Uncontrolled type 2 diabetes mellitus with nonproliferative retinopathy ([place]) 03/01/2016  . Vitamin D deficiency 07/07/2015  . Vitamin B12 deficiency 07/07/2015  . Snoring 12/25/2014  . Allergic  rhinitis, seasonal 12/14/2014  . Chronic constipation 12/14/2014  . Diabetes mellitus with renal manifestation ([place]) 12/14/2014  . Dyslipidemia 12/14/2014  . Dermatitis, eczematoid 12/14/2014  . Edema extremities 12/14/2014  . Essential (primary) hypertension 12/14/2014  . Gastro-esophageal reflux disease without esophagitis 12/14/2014  . Genital herpes in women 12/14/2014  . Extreme obesity 12/14/2014  . Female climacteric state 12/14/2014    Past Surgical History:  Procedure Laterality Date  . CESAREAN SECTION  1993  . tonsillectomy and adnoidectomy    . WISDOM TOOTH EXTRACTION       OB History    Gravida  1   Para  1   Term  1   Preterm      AB      Living  1     SAB      TAB      Ectopic      Multiple      Live Births               Home Medications    Prior to Admission medications   Medication Sig Start Date End Date Taking? Authorizing Provider  aspirin (ASPIRIN LOW DOSE) 81 MG chewable tablet Chew 1 tablet by mouth daily. 04/10/14  Yes [provider]  atorvastatin (LIPITOR) 40 MG tablet Take 1 tablet (40 mg total) by mouth every evening. 02/06/18  Yes Sowles, Drue Stager, MD  Cholecalciferol (VITAMIN D) 2000 units CAPS Take 1 capsule (2,000 Units total) by mouth daily. 03/25/16  Yes Sowles,  Drue Stager, MD  Empagliflozin-metFORMIN HCl ER (SYNJARDY XR) 25-1000 MG TB24 Take 1 tablet by mouth daily. 02/06/18  Yes Sowles, Drue Stager, MD  fluticasone (FLONASE) 50 MCG/ACT nasal spray Place 2 sprays into both nostrils daily. Patient taking differently: Place 2 sprays into both nostrils daily as needed for allergies.  03/27/18  Yes Sowles, Drue Stager, MD  insulin degludec (TRESIBA) 100 UNIT/ML SOPN FlexTouch Pen INJECT 20 TO 50 UNITS INTO THE SKIN DAILY Patient taking differently: Inject 20-50 Units into the skin daily.  05/06/18  Yes Sowles, Drue Stager, MD  telmisartan-hydrochlorothiazide (MICARDIS HCT) 80-25 MG tablet TAKE 1 TABLET BY MOUTH DAILY. *INS ONLY PAYS  FOR 30 DAYS* Patient taking differently: Take 1 tablet by mouth daily.  02/06/18  Yes Sowles, Drue Stager, MD  valACYclovir (VALTREX) 500 MG tablet Take 1 tablet (500 mg total) by mouth 3 (three) times daily as needed. And daily for prevention Patient taking differently: Take 500 mg by mouth 3 (three) times daily as needed (for prevention).  01/15/18  Yes Sowles, Drue Stager, MD  venlafaxine XR (EFFEXOR-XR) 150 MG 24 hr capsule Take 1 capsule (150 mg total) by mouth daily. 02/06/18  Yes Sowles, Drue Stager, MD  B-12, Methylcobalamin, 1000 MCG SUBL Place 1 tablet under the tongue daily. Patient not taking: Reported on 05/14/2018 03/25/16   Steele Sizer, MD  Blood Glucose Monitoring Suppl (ONE TOUCH ULTRA 2) w/Device KIT See admin instructions. 06/08/16   [provider]  Insulin Pen Needle (NOVOFINE) 32G X 6 MM MISC 1 each by Does not apply route daily. 11/06/17   Steele Sizer, MD  ONE TOUCH ULTRA TEST test strip CHECK FASTING BLOOD SUGARS TWICE DAILY 03/29/16   Steele Sizer, MD  [facility] DELICA LANCETS FINE MISC See admin instructions. 06/08/16   [provider]    Family History Family History  Problem Relation Age of Onset  . Diabetes Mother   . Asthma Mother   . Heart disease Mother   . Heart disease Father   . Prostate cancer Father   . Other Father        blockages in stomach  . Kidney disease Paternal Grandmother   . Asthma Son   . Autism Son   . Breast cancer Neg Hx   . Colon cancer Neg Hx   . Esophageal cancer Neg Hx   . Pancreatic cancer Neg Hx   . Rectal cancer Neg Hx   . Stomach cancer Neg Hx     Social History Social History   Tobacco Use  . Smoking status: Light Tobacco Smoker    Years: 20.00    Types: Cigarettes    Start date: 04/19/1971    Last attempt to quit: 04/19/1991    Years since quitting: 27.0  . Smokeless tobacco: Never Used  . Tobacco comment: she quitted and started back October 2019  Substance Use Topics  . Alcohol use: No     Alcohol/week: 0.0 standard drinks  . Drug use: No     Allergies   Penicillins   Review of Systems Review of Systems  Constitutional: Negative.   HENT: Negative.   Eyes: Negative.   Respiratory: Negative.   Cardiovascular: Negative.   Gastrointestinal: Positive for abdominal pain, nausea and vomiting.  Endocrine: Positive for polydipsia and polyuria.  Genitourinary: Negative.   Musculoskeletal: Negative.   Neurological: Negative.   Psychiatric/Behavioral: Negative.      Physical Exam Updated Vital Signs BP 117/72   Pulse (!) 133   Temp 98 F (36.7 C) (Oral)   Resp Marland Kitchen)  39   Ht _0  (1.6 m)   Wt 113.4 kg   LMP 01/26/2017 (Approximate)   SpO2 99%   BMI 44.29 kg/m   Physical Exam Constitutional:      Appearance: She is well-developed. She is obese.  HENT:     Head: Normocephalic and atraumatic.     Mouth/Throat:     Mouth: Mucous membranes are moist.     Pharynx: Oropharynx is clear.  Eyes:     Extraocular Movements: Extraocular movements intact.     Pupils: Pupils are equal, round, and reactive to light.  Cardiovascular:     Rate and Rhythm: Normal rate and regular rhythm.     Heart sounds: Normal heart sounds.  Pulmonary:     Effort: Pulmonary effort is normal.     Breath sounds: Normal breath sounds.  Abdominal:     General: Abdomen is flat. Bowel sounds are normal.     Palpations: Abdomen is soft.     Tenderness: There is abdominal tenderness in the right upper quadrant, right lower quadrant, left upper quadrant and left lower quadrant.  Skin:    General: Skin is warm and dry.     Capillary Refill: Capillary refill takes less than 2 seconds.  Neurological:     General: No focal deficit present.     Mental Status: She is alert.  Psychiatric:        Mood and Affect: Mood normal.      ED Treatments / Results  Labs (all labs ordered are listed, but only abnormal results are displayed) Labs Reviewed  BASIC METABOLIC PANEL - Abnormal; Notable for  the following components:      Result Value   Sodium 130 (*)    Chloride 89 (*)    CO2 20 (*)    Glucose, Bld 711 (*)    BUN 40 (*)    Creatinine, Ser 2.18 (*)    GFR calc non Af Amer 24 (*)    GFR calc Af Amer 28 (*)    Anion gap 21 (*)    All other components within normal limits  CBC WITH DIFFERENTIAL/PLATELET - Abnormal; Notable for the following components:   WBC 22.5 (*)    MCH 25.9 (*)    Neutro Abs 19.1 (*)    All other components within normal limits  CBG MONITORING, ED - Abnormal; Notable for the following components:   Glucose-Capillary >600 (*)    All other components within normal limits  I-STAT BETA HCG BLOOD, ED ([facility], [facility], [facility] ONLY) - Abnormal; Notable for the following components:   I-stat hCG, quantitative 28.5 (*)    All other components within normal limits  POCT I-STAT EG7 - Abnormal; Notable for the following components:   pH, Ven 7.481 (*)    pCO2, Ven 35.7 (*)    pO2, Ven 76.0 (*)    Acid-Base Excess 3.0 (*)    Sodium 129 (*)    Potassium 5.2 (*)    Calcium, Ion 1.11 (*)    All other components within normal limits  CBG MONITORING, ED - Abnormal; Notable for the following components:   Glucose-Capillary 599 (*)    All other components within normal limits  CBG MONITORING, ED - Abnormal; Notable for the following components:   Glucose-Capillary >600 (*)    All other components within normal limits  CBG MONITORING, ED - Abnormal; Notable for the following components:   Glucose-Capillary 559 (*)    All other components within normal limits  CBG MONITORING, ED - Abnormal; Notable for the following components:   Glucose-Capillary 541 (*)    All other components within normal limits  CULTURE, BLOOD (ROUTINE X 2)  CULTURE, BLOOD (ROUTINE X 2)  URINALYSIS, ROUTINE W REFLEX MICROSCOPIC  BLOOD GAS, VENOUS  I-STAT VENOUS BLOOD GAS, ED  I-STAT CG4 LACTIC ACID, ED    EKG EKG Interpretation  Date/Time:  Monday May 14 2018 14:26:05 EST Ventricular  Rate:  142 PR Interval:    QRS Duration: 83 QT Interval:  304 QTC Calculation: 468 R Axis:   96 Text Interpretation:  Sinus tachycardia Multiform ventricular premature complexes Aberrant complex Borderline right axis deviation Artifact in lead(s) I III aVL Confirmed by Elnora Morrison [phone]) on 05/14/2018 2:30:04 PM   Radiology Dg Chest Portable 1 View  Result Date: 05/14/2018 CLINICAL DATA:  Abdominal pain for several days EXAM: PORTABLE CHEST 1 VIEW COMPARISON:  CT from earlier in the same day. FINDINGS: Elevation of the right hemidiaphragm is again seen. Cardiac shadow is within normal limits. Mild right basilar atelectatic changes are noted. No sizable effusion is seen. No bony abnormality is noted. IMPRESSION: Mild right basilar atelectasis. Electronically Signed   By: Inez Catalina M.D.   On: 05/14/2018 15:26    Procedures Procedures (including critical care time)  Medications Ordered in ED Medications  insulin regular, human (MYXREDLIN) 100 units/ 100 mL infusion (14.4 Units/hr Intravenous Rate/Dose Change 05/14/18 1544)  sodium chloride 0.9 % bolus 1,000 mL (0 mLs Intravenous Stopped 05/14/18 1434)    And  0.9 %  sodium chloride infusion ( Intravenous New Bag/Given 05/14/18 1434)  potassium chloride 10 mEq in 100 mL IVPB (10 mEq Intravenous Not Given 05/14/18 1434)  ceFEPIme (MAXIPIME) 2 g in sodium chloride 0.9 % 100 mL IVPB (has no administration in time range)  metroNIDAZOLE (FLAGYL) IVPB 500 mg (has no administration in time range)  ceFEPIme (MAXIPIME) 1 g in sodium chloride 0.9 % 100 mL IVPB (has no administration in time range)  sodium chloride 0.9 % bolus 1,000 mL (1,000 mLs Intravenous New Bag/Given 05/14/18 1613)  ondansetron (ZOFRAN) injection 4 mg (4 mg Intravenous Given 05/14/18 1244)  morphine 4 MG/ML injection 4 mg (4 mg Intravenous Given 05/14/18 1433)     Initial Impression / Assessment and Plan / ED Course  I have reviewed the triage vital signs and the nursing  notes.  Pertinent labs & imaging results that were available during my care of the patient were reviewed by me and considered in my medical decision making (see chart for details).   Patient is a 58 year old female past medical history significant for type 2 diabetes who presents today with hyperglycemia with blood glucose greater than 700 in the setting of diabetes medication nonadherence.  Symptoms and presentation consistent with DKA.  Patient was started on IV fluid and insulin drip.  Initial VBG revealed pH of 7.4 CO2 75.72 26 bicarb 26.7.  Initial sodium 129, potassium 4.6, bicarb 20, AG 21 consistent with DKA. Patient also has leukocytosis 22.5 with neutrophilia concerning for possible abdominal process. Lactic acid is pending. CXR showed mild bibasilar atelectasis but no signs of pneumonia. CT abdomen showed small bowel obstruction with microperforation. Patient has been bolus with 2L NS and started on Zosyn. Blood cultures were also collected.  Patient will need admission for further management of DKA and microperforation of the distal ileus with microperforation seen on CT. Hospitalist service was consulted and will admit for DKA management and sepsis picture. General surgery was  also consulted and will see patient for small bowel obstruction with microperforation.  Final Clinical Impressions(s) / ED Diagnoses   Final diagnoses:  Type 2 diabetes mellitus with ketoacidosis without coma, with long-term current use of insulin ([place])  Small bowel obstruction [facility])    ED Discharge Orders    None       Marjie Skiff, MD 05/14/18 1621    Elnora Morrison, MD 05/14/18 1624

## 2018-05-14 NOTE — Progress Notes (Signed)
Pharmacy Antibiotic Note  Wendy [name] is a 58 y.o. female admitted on 05/14/2018 with sepsis.  Pharmacy has been consulted for Cefepime and Flagyl dosing.  Plan: Cefepime 2 gm IV x 1, then 1 gm IV q8hr Flagyl 500 mg IV q8hr Will monitor renal function, C&S  Height: 5\' 3"  (160 cm) Weight: 250 lb (113.4 kg) IBW/kg (Calculated) : 52.4  Temp (24hrs), Avg:98 F (36.7 C), Min:98 F (36.7 C), Max:98 F (36.7 C)  Recent Labs  Lab 05/14/18 1133  WBC 22.5*  CREATININE 2.18*    Estimated Creatinine Clearance: 34.5 mL/min (A) (by C-G formula based on SCr of 2.18 mg/dL (H)).    Allergies  Allergen Reactions  . Penicillins Swelling    Did it involve swelling of the face/tongue/throat, SOB, or low BP? Yes Did it involve sudden or severe rash/hives, skin peeling, or any reaction on the inside of your mouth or nose? No Did you need to seek medical attention at a hospital or doctor's office? Yes When did it last happen? unknown If all above answers are "NO", may proceed with cephalosporin use.     Antimicrobials this admission: Cefepime 1/27 >> Flagyl 1/27 >>   Thank you for allowing pharmacy to be a part of this patient's care.  Alanda Slim, PharmD, [facility] Clinical Pharmacist Please see AMION for all Pharmacists' Contact Phone Numbers 05/14/2018, 3:19 PM

## 2018-05-14 NOTE — Consult Note (Signed)
[facility] Consult/Admission Note  Wendy [name] 27-Nov-1960  [id number].    Requesting MD: Dr. Reather Converse Chief Complaint/Reason for Consult: SBO with pneumotosis  HPI:   Pt is a 58 yo female with a hx of obesity, HTN, Dm type II who presented to ED with complaints of upper abdominal pain. Per EDP note: Pt states pain started 3 days ago, with vomiting, and pt has not been able to eat. When I went to exam the pt she was slow to respond to questions and unable to answer some of them. She states abdominal pain for a while now without being able to give me a time frame. When asked weeks or months she stated yes. She was unable to tell me how much or about her emesis. Nurse states pt was much more responsive earlier and that the pt had morphine hours ago and nothing since. I spoke to Dr. Wilson Singer about the pt's mental status and he examed the pt with me and stated he was going to contact critical care.  I spoke to the radiologist, Dr. Jillyn Hidden, and he stated SBO with transition point of the distal ileum, small microperforation, few dots of free air.   ROS:  Review of Systems  Unable to perform ROS: Mental status change  level 5 caveat, pt was not cooperative    Family History  Problem Relation Age of Onset  . Diabetes Mother   . Asthma Mother   . Heart disease Mother   . Heart disease Father   . Prostate cancer Father   . Other Father        blockages in stomach  . Kidney disease Paternal Grandmother   . Asthma Son   . Autism Son   . Breast cancer Neg Hx   . Colon cancer Neg Hx   . Esophageal cancer Neg Hx   . Pancreatic cancer Neg Hx   . Rectal cancer Neg Hx   . Stomach cancer Neg Hx     Past Medical History:  Diagnosis Date  . Acute eczema   . Allergy   . Anemia   . Anxiety   . Chronic constipation with overflow   . Diabetes mellitus without complication ([place])   . GERD (gastroesophageal reflux disease)   . Herpetic vesicle in vagina   . Hyperlipidemia   .  Hypertension   . Leg edema   . Morbid obesity ()   . Peri-menopause   . Snoring     Past Surgical History:  Procedure Laterality Date  . CESAREAN SECTION  1993  . tonsillectomy and adnoidectomy    . WISDOM TOOTH EXTRACTION      Social History:  reports that she has been smoking cigarettes. She started smoking about 47 years ago. She has smoked for the past 20.00 years. She has never used smokeless tobacco. She reports that she does not drink alcohol or use drugs.  Allergies:  Allergies  Allergen Reactions  . Penicillins Swelling    Did it involve swelling of the face/tongue/throat, SOB, or low BP? Yes Did it involve sudden or severe rash/hives, skin peeling, or any reaction on the inside of your mouth or nose? No Did you need to seek medical attention at a hospital or doctor's office? Yes When did it last happen? unknown If all above answers are "NO", may proceed with cephalosporin use.     (Not in a hospital admission)   Blood pressure 120/80, pulse (!) 139, temperature 98 F (36.7 C), temperature source Oral, resp.  rate (!) 29, height 5\' 3"  (1.6 m), weight 113.4 kg, last menstrual period 01/26/2017, SpO2 100 %.  Physical Exam Vitals signs and nursing note reviewed.  Constitutional:      Appearance: Normal appearance. She is obese. She is not diaphoretic.     Comments: Uncooperative   HENT:     Head: Normocephalic and atraumatic.     Nose: Nose normal.     Mouth/Throat:     Lips: Pink.     Mouth: Mucous membranes are dry.     Pharynx: No oropharyngeal exudate.  Eyes:     General: No scleral icterus.       Right eye: No discharge.        Left eye: No discharge.     Conjunctiva/sclera: Conjunctivae normal.     Pupils: Pupils are equal, round, and reactive to light.  Neck:     Musculoskeletal: Normal range of motion and neck supple.  Cardiovascular:     Rate and Rhythm: Regular rhythm. Tachycardia present.     Pulses:          Radial pulses are 2+ on the  right side and 2+ on the left side.       Dorsalis pedis pulses are 2+ on the right side and 2+ on the left side.     Heart sounds: Normal heart sounds. No murmur.  Pulmonary:     Effort: Pulmonary effort is normal. No respiratory distress.     Breath sounds: Normal breath sounds. No wheezing, rhonchi or rales.  Abdominal:     General: Bowel sounds are decreased. There is distension.     Palpations: Abdomen is not rigid.     Tenderness: There is generalized abdominal tenderness. There is no guarding.     Comments: Firm abdomen, no peritonitis   Musculoskeletal: Normal range of motion.        General: No tenderness or deformity.  Skin:    General: Skin is warm and dry.     Findings: No rash.  Neurological:     Mental Status: She is alert and oriented to person, place, and time.     Comments: Follows commands and answers some questions although not cooperative      Results for orders placed or performed during the hospital encounter of 05/14/18 (from the past 48 hour(s))  CBG monitoring, ED     Status: Abnormal   Collection Time: 05/14/18 11:15 AM  Result Value Ref Range   Glucose-Capillary >600 (HH) 70 - 99 mg/dL  Basic metabolic panel     Status: Abnormal   Collection Time: 05/14/18 11:33 AM  Result Value Ref Range   Sodium 130 (L) 135 - 145 mmol/L   Potassium 4.6 3.5 - 5.1 mmol/L   Chloride 89 (L) 98 - 111 mmol/L   CO2 20 (L) 22 - 32 mmol/L   Glucose, Bld 711 (HH) 70 - 99 mg/dL    Comment: CRITICAL RESULT CALLED TO, READ BACK BY AND VERIFIED WITH: Carlus Pavlov RN [id number] [phone] BY A BENNETT    BUN 40 (H) 6 - 20 mg/dL   Creatinine, Ser 2.18 (H) 0.44 - 1.00 mg/dL   Calcium 9.8 8.9 - 10.3 mg/dL   GFR calc non Af Amer 24 (L) >60 mL/min   GFR calc Af Amer 28 (L) >60 mL/min   Anion gap 21 (H) 5 - 15    Comment: Performed at [facility], [address], [place], [place] [address]  CBC with Differential (PNL)  Status: Abnormal   Collection Time: 05/14/18 11:33 AM  Result  Value Ref Range   WBC 22.5 (H) 4.0 - 10.5 K/uL   RBC 5.09 3.87 - 5.11 MIL/uL   Hemoglobin 13.2 12.0 - 15.0 g/dL   HCT 41.7 36.0 - 46.0 %   MCV 81.9 80.0 - 100.0 fL   MCH 25.9 (L) 26.0 - 34.0 pg   MCHC 31.7 30.0 - 36.0 g/dL   RDW 14.7 11.5 - 15.5 %   Platelets 370 150 - 400 K/uL   nRBC 0.0 0.0 - 0.2 %   Neutrophils Relative % 85 %   Neutro Abs 19.1 (H) 1.7 - 7.7 K/uL   Lymphocytes Relative 11 %   Lymphs Abs 2.5 0.7 - 4.0 K/uL   Monocytes Relative 4 %   Monocytes Absolute 0.9 0.1 - 1.0 K/uL   Eosinophils Relative 0 %   Eosinophils Absolute 0.0 0.0 - 0.5 K/uL   Basophils Relative 0 %   Basophils Absolute 0.0 0.0 - 0.1 K/uL   nRBC 0 0 /100 WBC   Abs Immature Granulocytes 0.00 0.00 - 0.07 K/uL   Acanthocytes PRESENT    Tear Drop Cells PRESENT    Burr Cells PRESENT    Polychromasia PRESENT     Comment: Performed at [facility], [place] [address], [place], [place] [address]  I-Stat beta hCG blood, ED     Status: Abnormal   Collection Time: 05/14/18 11:42 AM  Result Value Ref Range   I-stat hCG, quantitative 28.5 (H) <5 mIU/mL   Comment 3            Comment:   GEST. AGE      CONC.  (mIU/mL)   <=1 WEEK        5 - 50     2 WEEKS       50 - 500     3 WEEKS       100 - 10,000     4 WEEKS     1,000 - 30,000        FEMALE AND NON-PREGNANT FEMALE:     LESS THAN 5 mIU/mL   POCT I-Stat EG7     Status: Abnormal   Collection Time: 05/14/18 11:43 AM  Result Value Ref Range   pH, Ven 7.481 (H) 7.250 - 7.430   pCO2, Ven 35.7 (L) 44.0 - 60.0 mmHg   pO2, Ven 76.0 (H) 32.0 - 45.0 mmHg   Bicarbonate 26.7 20.0 - 28.0 mmol/L   TCO2 28 22 - 32 mmol/L   O2 Saturation 96.0 %   Acid-Base Excess 3.0 (H) 0.0 - 2.0 mmol/L   Sodium 129 (L) 135 - 145 mmol/L   Potassium 5.2 (H) 3.5 - 5.1 mmol/L   Calcium, Ion 1.11 (L) 1.15 - 1.40 mmol/L   HCT 43.0 36.0 - 46.0 %   Hemoglobin 14.6 12.0 - 15.0 g/dL   Patient temperature HIDE    Sample type VENOUS   CBG monitoring, ED     Status: Abnormal    Collection Time: 05/14/18  2:27 PM  Result Value Ref Range   Glucose-Capillary 599 (HH) 70 - 99 mg/dL   Comment 1 Document in Chart   CBG monitoring, ED     Status: Abnormal   Collection Time: 05/14/18  3:31 PM  Result Value Ref Range   Glucose-Capillary >600 (HH) 70 - 99 mg/dL  CBG monitoring, ED     Status: Abnormal   Collection Time: 05/14/18  3:33 PM  Result Value Ref Range   Glucose-Capillary 559 (HH) 70 - 99 mg/dL   Comment 1 Notify RN   CBG monitoring, ED     Status: Abnormal   Collection Time: 05/14/18  3:37 PM  Result Value Ref Range   Glucose-Capillary 541 (HH) 70 - 99 mg/dL   Comment 1 Notify RN    Comment 2 Document in Chart   CBG monitoring, ED     Status: Abnormal   Collection Time: 05/14/18  4:39 PM  Result Value Ref Range   Glucose-Capillary 400 (H) 70 - 99 mg/dL   Comment 1 Notify RN    Comment 2 Document in Chart   POCT I-Stat EG7     Status: Abnormal   Collection Time: 05/14/18  4:41 PM  Result Value Ref Range   pH, Ven 7.283 7.250 - 7.430   pCO2, Ven 54.4 44.0 - 60.0 mmHg   pO2, Ven 127.0 (H) 32.0 - 45.0 mmHg   Bicarbonate 25.7 20.0 - 28.0 mmol/L   TCO2 27 22 - 32 mmol/L   O2 Saturation 98.0 %   Acid-base deficit 2.0 0.0 - 2.0 mmol/L   Sodium 134 (L) 135 - 145 mmol/L   Potassium 4.1 3.5 - 5.1 mmol/L   Calcium, Ion 1.19 1.15 - 1.40 mmol/L   HCT 40.0 36.0 - 46.0 %   Hemoglobin 13.6 12.0 - 15.0 g/dL   Patient temperature HIDE    Sample type VENOUS    Dg Chest Portable 1 View  Result Date: 05/14/2018 CLINICAL DATA:  Abdominal pain for several days EXAM: PORTABLE CHEST 1 VIEW COMPARISON:  CT from earlier in the same day. FINDINGS: Elevation of the right hemidiaphragm is again seen. Cardiac shadow is within normal limits. Mild right basilar atelectatic changes are noted. No sizable effusion is seen. No bony abnormality is noted. IMPRESSION: Mild right basilar atelectasis. Electronically Signed   By: Inez Catalina M.D.   On: 05/14/2018 15:26       Assessment/Plan Active Problems:   Small bowel obstruction (HCC)  DKA SBO with transition point of distal ileum Pneumoperitoneum indicating small microperforation - concerned for ischemic bowel - WBC 22.5, creatinine 2.18, tachycardic,  - lactic acid pending - agree with Dr. Wilson Singer that pt should go to ICU under the care of critical care in setting of mental status change  - will discuss plan with Dr. Grandville Silos  Plan: IVF, NPO, NGT  Kalman Drape, Ms [facility] 05/14/2018, 4:51 PM Pager: [phone] Consults: [phone] Mon-Fri 7:00 am-4:30 pm Sat-Sun 7:00 am-11:30 am

## 2018-05-14 NOTE — Anesthesia Preprocedure Evaluation (Signed)
Anesthesia Evaluation  Patient identified by MRN, date of birth, ID band Patient awake    Reviewed: Unable to perform ROS - Chart review onlyPreop documentation limited or incomplete due to emergent nature of procedure.  Airway Mallampati: III  TM Distance: >3 FB Neck ROM: Full    Dental  (+) Teeth Intact   Pulmonary Current Smoker,    breath sounds clear to auscultation       Cardiovascular hypertension,  Rhythm:Regular Rate:Tachycardia     Neuro/Psych Anxiety    GI/Hepatic GERD  ,ischemic bowel   Endo/Other  diabetes, Poorly Controlled, Type 2, Insulin DependentDKA  Renal/GU Renal disease     Musculoskeletal   Abdominal   Peds  Hematology  (+) anemia ,   Anesthesia Other Findings   Reproductive/Obstetrics                             Anesthesia Physical Anesthesia Plan  ASA: IV and emergent  Anesthesia Plan: General   Post-op Pain Management:    Induction: Intravenous and Rapid sequence  PONV Risk Score and Plan: 2 and Ondansetron and Treatment may vary due to age or medical condition  Airway Management Planned: Oral ETT  Additional Equipment: Arterial line and CVP  Intra-op Plan:   Post-operative Plan: Extubation in OR and Possible Post-op intubation/ventilation  Informed Consent: I have reviewed the patients History and Physical, chart, labs and discussed the procedure including the risks, benefits and alternatives for the proposed anesthesia with the patient or authorized representative who has indicated his/her understanding and acceptance.     Dental advisory given and Only emergency history available  Plan Discussed with: CRNA and Surgeon  Anesthesia Plan Comments:         Anesthesia Quick Evaluation

## 2018-05-14 NOTE — Progress Notes (Signed)
Patient came up from ED. Sinus Tach rates in 138, Phone Consent done it ED with Jill Poling per Jinny Blossom, RN in ED. Attempted to contact husband to sign as second RN for telephone consent and I was unable to contact him.

## 2018-05-14 NOTE — ED Triage Notes (Signed)
Pt BIB ptar, complains of n/v and abd x3 days. CBG 561 and has not taken insulin today. VS 110/70, HR 140, RR 18

## 2018-05-14 NOTE — Anesthesia Procedure Notes (Signed)
Central Venous Catheter Insertion Performed by: Oleta Mouse, MD, anesthesiologist Start/End1/27/2020 6:21 PM, 05/14/2018 6:27 PM Patient location: OR. Preanesthetic checklist: patient identified, IV checked, site marked, risks and benefits discussed, surgical consent, monitors and equipment checked, pre-op evaluation, timeout performed and anesthesia consent Position: supine Patient sedated Hand hygiene performed  and maximum sterile barriers used  Catheter size: 8 Fr Total catheter length 16. Central line was placed.Double lumen Procedure performed using ultrasound guided technique. Ultrasound Notes:anatomy identified, needle tip was noted to be adjacent to the nerve/plexus identified, no ultrasound evidence of intravascular and/or intraneural injection and image(s) printed for medical record Attempts: 1 Following insertion, dressing applied, line sutured and Biopatch. Post procedure assessment: blood return through all ports, free fluid flow and no air  Patient tolerated the procedure well with no immediate complications.

## 2018-05-14 NOTE — Progress Notes (Signed)
Patient ID: Wendy [name], female   DOB: March 18, 1961, 58 y.o.   MRN: [id number] I called her husband and discussed the planned surgery for exploratory laparotomy and bowel resection including the risks and benefits. I also discussed the expected post op course and possible need for more surgery. He agrees. Phone consent done. He is coming to the hospital.  Georganna Skeans, MD, MPH, FACS Trauma: [phone] General Surgery: [phone]

## 2018-05-14 NOTE — ED Provider Notes (Addendum)
5:01 PM Pt seen on previous shift and initial plan was admission to the medical service to stepdown unit. DKA and CT with ileus, pneumatosis and tiny amount of pneumoperitoneum.Jackson Latino, PA, with surgery discussed with me after her evaluation. She is concerned about the patient's mental status. Nursing also reporting that it has changed. On my exam she is pretty confused. Does answer some simple questions but clearly her comprehension is impaired. Lungs sounds clear. Heart tachy. Abdomen is distended and rigid.  She is getting cefepime/flagyl. She is on an insulin gtt.  I am concerned with her worsening mental status and also her abdominal exam with these CT findings. Dead gut? Discussed with CCM, Dr Nelda Marseille.    Dg Chest Portable 1 View  Result Date: 05/14/2018 CLINICAL DATA:  Abdominal pain for several days EXAM: PORTABLE CHEST 1 VIEW COMPARISON:  CT from earlier in the same day. FINDINGS: Elevation of the right hemidiaphragm is again seen. Cardiac shadow is within normal limits. Mild right basilar atelectatic changes are noted. No sizable effusion is seen. No bony abnormality is noted. IMPRESSION: Mild right basilar atelectasis. Electronically Signed   By: Inez Catalina M.D.   On: 05/14/2018 15:26        Virgel Manifold, MD 05/14/18 1702    Virgel Manifold, MD 05/14/18 2016

## 2018-05-14 NOTE — Progress Notes (Signed)
Fio2 dropped to 40% per ABG results.

## 2018-05-14 NOTE — Progress Notes (Addendum)
Pharmacy Antibiotic Note  Wendy [name] is a 58 y.o. female admitted on 05/14/2018 with sepsis from small bowel perforation.  Pharmacy has been consulted for Vancomycin and Anidulafungin dosing.  Plan: Vanc 2000 mg IV x 1, pharmacy will dose further based on changing renal function. Anidulafungin 200 mg IV x 1, then 100 mg IV q24hr Monitor renal function, C&S, and vanc levels as needed.   Height: 5\' 3"  (160 cm) Weight: 250 lb (113.4 kg) IBW/kg (Calculated) : 52.4  Temp (24hrs), Avg:99.9 F (37.7 C), Min:98 F (36.7 C), Max:101.8 F (38.8 C)  Recent Labs  Lab 05/14/18 1133 05/14/18 1648  WBC 22.5*  --   CREATININE 2.18*  --   LATICACIDVEN  --  5.2*    Estimated Creatinine Clearance: 34.5 mL/min (A) (by C-G formula based on SCr of 2.18 mg/dL (H)).    Allergies  Allergen Reactions  . Penicillins Swelling    Did it involve swelling of the face/tongue/throat, SOB, or low BP? Yes Did it involve sudden or severe rash/hives, skin peeling, or any reaction on the inside of your mouth or nose? No Did you need to seek medical attention at a hospital or doctor's office? Yes When did it last happen? unknown If all above answers are "NO", may proceed with cephalosporin use.     Antimicrobials this admission: vanc 1/27 >> Cefepime 1/27 >> Flagyl 1/27 >> Anidulafungin 1/27 >>   Thank you for allowing pharmacy to be a part of this patient's care.  Alanda Slim, PharmD, [facility] Clinical Pharmacist Please see AMION for all Pharmacists' Contact Phone Numbers 05/14/2018, 5:46 PM

## 2018-05-14 NOTE — ED Notes (Signed)
CBG 541 RN notified

## 2018-05-14 NOTE — H&P (Addendum)
NAME:  Wendy [name], MRN:  [id number], DOB:  19-Jul-1960, LOS: 0 ADMISSION DATE:  05/14/2018, CONSULTATION DATE:  05/14/2018 REFERRING MD:  Dr. Wilson Singer, CHIEF COMPLAINT:  Abd pain  Brief History   61 yoF presenting with 3 days of vomiting and abdominal pain.  Glucose 711 in ER.  Additionally patient with acute abd, CT showing SBO with pneumatosis.  Going to OR.  PCCM to admit.  History of present illness   HPI obtained mostly from medical chart review as patient is vague and not able to provide much information.   58 year old female with history of morbid obesity, HTN, DMT2, GERD, and HLD presenting to ER with 3 day history of vomiting and abdominal pain. Also had complained of polyuria and unable to eat for 3 days.  Initially denied any SOB or dyspnea.  Has not been taking her home diabetes meds as prescribed for several days.    In ER, labs noted for glucose 771, AG 21, corrected Na 140, K 4.6, Cl 89, BUN 40, sCr 2.18 (previously 0.69 in 05/2017), WBC 22.15.  CT of abd/ pelvis showed a diffuse small bowel obstruction with penumoperitoneum.  In the ER, noted to be febrile, tachycardic, and normotensive.  Given 3L NS and started on insulin gtt. Started on cefepime and flagyl.  In ER, patient progressively worsened status and mental status.  Surgery consulted and PCCM consulted for admission.    Past Medical History  Obesity, HTN, DMT2 (poorly controlled), diabetic retinopathy, GERD, HLD  Significant Hospital Events   1/27 Admit -> OR   Consults:  CCS  Procedures:  1/27 to OR for exlap  Significant Diagnostic Tests:  1/27 CT abd/pelvis >> Severe diffuse SBO w/transition level of distal ileum in RLQ.  Few punctate foci of pneumoperitoneum indicating small/ micro perforation.  Small amount of ascites.    1/27 CXR >> mild right basilar atelectasis  Micro Data:  1/27  BC x 2 >> 1/27 UC  >> 1/27 MRSA PCR >> 1/27 trach asp >>  Antimicrobials:  1/27 cefepime x 1 1/27 flagyl x 1 1/27 vanc  >> 1/27 zosyn >> 1/27 eraxis   Interim history/subjective:   Objective   Blood pressure 120/80, pulse (!) 139, temperature 98 F (36.7 C), temperature source Oral, resp. rate (!) 29, height _0  (1.6 m), weight 113.4 kg, last menstrual period 01/26/2017, SpO2 100 %.        Intake/Output Summary (Last 24 hours) at 05/14/2018 1712 Last data filed at 05/14/2018 1649 Gross per 24 hour  Intake 2220 ml  Output -  Net 2220 ml   Filed Weights   05/14/18 1436  Weight: 113.4 kg    Examination: General: Ill appearing morbidly obese adult female HEENT: MM gray/dry Neuro: Lethargic, attempts to answer some questions, MAE CV: ST 140's PULM: even/non-labored, lungs bilaterally clear GI: firm, guarding, no BS Extremities: warm/dry, no edema  Skin: no rashes  Resolved Hospital Problem list    Assessment & Plan:  Small bowel obstruction with small perforation w/ peritonitis on exam  P:  Per CCS Going to emergently to OR- will be intubated/ CVL placed there Change to vanc/ zosyn/ eraxis  Will return to ICU on MV  Sepsis related to acute abdomen  - BP stable thus far P:  Tele monitoring Goal MAP >65 Assess and trend lactate Additional 2L NS now (will make 5 liters total) abx as above  Follow culture data/ narrow abx as able   Hyperglycemia with AG Hx DMT2  P:  Pending UA, beta hydroxybutyric acid Continue insulin gtt per protocol w/ additional 2L NS as above (total 5)  AKI AGMA/ lactic acidosis - related to ?DKA vs ischemic bowel/ lactic acidosis P:  Additional IVF Insert foley, send for UA/ culture Strict I/O's, daily weights Renal panel in am  Avoid nephrotoxic meds    Acute respiratory insufficiency  - returning to ICU on MV P:  Full MV support, PRVC 8 cc/kg CXR and ABG stat VAP measures PPI for SUP  Best practice:  Diet: NPO Pain/Anxiety/Delirium protocol (if indicated): fentanyl/ versed  VAP protocol (if indicated): yes DVT prophylaxis: SCDs GI  prophylaxis: PPI Glucose control: insulin gtt/ glucostabilizer  Mobility: BR Code Status: Full  Family Communication: No family at bedside.  Husband was contacted by CCS who consented to surgery.   Disposition:  ER -> OR- > ICU  Labs   CBC: Recent Labs  Lab 05/14/18 1133 05/14/18 1143 05/14/18 1641  WBC 22.5*  --   --   NEUTROABS 19.1*  --   --   HGB 13.2 14.6 13.6  HCT 41.7 43.0 40.0  MCV 81.9  --   --   PLT 370  --   --     Basic Metabolic Panel: Recent Labs  Lab 05/14/18 1133 05/14/18 1143 05/14/18 1641  NA 130* 129* 134*  K 4.6 5.2* 4.1  CL 89*  --   --   CO2 20*  --   --   GLUCOSE 711*  --   --   BUN 40*  --   --   CREATININE 2.18*  --   --   CALCIUM 9.8  --   --    GFR: Estimated Creatinine Clearance: 34.5 mL/min (A) (by C-G formula based on SCr of 2.18 mg/dL (H)). Recent Labs  Lab 05/14/18 1133  WBC 22.5*    Liver Function Tests: No results for input(s): AST, ALT, ALKPHOS, BILITOT, PROT, ALBUMIN in the last 168 hours. No results for input(s): LIPASE, AMYLASE in the last 168 hours. No results for input(s): AMMONIA in the last 168 hours.  ABG    Component Value Date/Time   HCO3 25.7 05/14/2018 1641   TCO2 27 05/14/2018 1641   ACIDBASEDEF 2.0 05/14/2018 1641   O2SAT 98.0 05/14/2018 1641     Coagulation Profile: No results for input(s): INR, PROTIME in the last 168 hours.  Cardiac Enzymes: No results for input(s): CKTOTAL, CKMB, CKMBINDEX, TROPONINI in the last 168 hours.  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   Surgical History    Past Surgical History:  Procedure Laterality Date  . CESAREAN SECTION  1993  . tonsillectomy and adnoidectomy    . WISDOM TOOTH EXTRACTION       Social History   reports that she has been smoking cigarettes. She started smoking about 47 years ago. She has smoked for the  past 20.00 years. She has never used smokeless tobacco. She reports that she does not drink alcohol or use drugs.   Family History   Her family history includes Asthma in her mother and son; Autism in her son; Diabetes in her mother; Heart disease in her father and mother; Kidney disease in her paternal grandmother; Other in her father; Prostate cancer in her father. There is no history of Breast cancer, Colon cancer, Esophageal cancer, Pancreatic cancer, Rectal cancer, or Stomach cancer.   Allergies Allergies  Allergen Reactions  . Penicillins Swelling    Did it involve swelling of the face/tongue/throat, SOB, or low BP? Yes Did it involve sudden or severe rash/hives, skin peeling, or any reaction on the inside of your mouth or nose? No Did you need to seek medical attention at a hospital or doctor's office? Yes When did it last happen? unknown If all above answers are "NO", may proceed with cephalosporin use.      Home Medications  Prior to Admission medications   Medication Sig Start Date End Date Taking? Authorizing Provider  aspirin (ASPIRIN LOW DOSE) 81 MG chewable tablet Chew 1 tablet by mouth daily. 04/10/14  Yes [provider]  atorvastatin (LIPITOR) 40 MG tablet Take 1 tablet (40 mg total) by mouth every evening. 02/06/18  Yes Sowles, Drue Stager, MD  Cholecalciferol (VITAMIN D) 2000 units CAPS Take 1 capsule (2,000 Units total) by mouth daily. 03/25/16  Yes Sowles,  Drue Stager, MD  Empagliflozin-metFORMIN HCl ER (SYNJARDY XR) 25-1000 MG TB24 Take 1 tablet by mouth daily. 02/06/18  Yes Sowles, Drue Stager, MD  fluticasone (FLONASE) 50 MCG/ACT nasal spray Place 2 sprays into both nostrils daily. Patient taking differently: Place 2 sprays into both nostrils daily as needed for allergies.  03/27/18  Yes Sowles, Drue Stager, MD  insulin degludec (TRESIBA) 100 UNIT/ML SOPN FlexTouch Pen INJECT 20 TO 50 UNITS INTO THE SKIN DAILY Patient taking differently: Inject 20-50 Units into the skin daily.  05/06/18  Yes Sowles, Drue Stager, MD  telmisartan-hydrochlorothiazide (MICARDIS HCT) 80-25 MG tablet TAKE 1 TABLET BY MOUTH DAILY. *INS ONLY PAYS FOR 30 DAYS* Patient taking differently: Take 1 tablet by mouth daily.  02/06/18  Yes Sowles, Drue Stager, MD  valACYclovir (VALTREX) 500 MG tablet Take 1 tablet (500 mg total) by mouth 3 (three) times daily as needed. And daily for prevention Patient taking differently: Take 500 mg by mouth 3 (three) times daily as needed (for prevention).  01/15/18  Yes Sowles, Drue Stager, MD  venlafaxine XR (EFFEXOR-XR) 150 MG 24 hr capsule Take 1 capsule (150 mg total) by mouth daily. 02/06/18  Yes Sowles, Drue Stager, MD  B-12, Methylcobalamin, 1000 MCG SUBL Place 1 tablet under the tongue daily. Patient not taking: Reported on 05/14/2018 03/25/16   Steele Sizer, MD  Blood Glucose Monitoring Suppl (ONE TOUCH ULTRA 2) w/Device KIT See admin instructions. 06/08/16   [provider]  Insulin Pen Needle (NOVOFINE) 32G X 6 MM MISC 1 each by Does not apply route daily. 11/06/17   Steele Sizer, MD  ONE TOUCH ULTRA TEST test strip CHECK FASTING BLOOD SUGARS TWICE DAILY 03/29/16   Steele Sizer, MD  [facility] DELICA LANCETS FINE MISC See admin instructions. 06/08/16   [provider]     Critical care time: 40 mins    Kennieth Rad, MSN, AGACNP-BC [facility] Pgr: [phone] or if no answer [phone] 05/14/2018, 5:59 PM  Attending  Note:  58 year old female with PMH of poorly controlled diabetes who presents to  PCCM for abdominal pain and was noted to have an acute abdomen on exam with clear lungs and an acutely ill appearing female.  I reviewed CT of the abdomen myself, pneumatosis noted.  Discussed with surgery.  Will hydrate.  Patient is to be taken to the OR for an ex-lap.  DKA protocol started.  Placed vent and sedation orders as well.  Placed on cefepime, vanc and eraxis.  Pan cultures.  Will evaluate post op with ABG and adjust vent accordingly.  PCCM will admit.  The patient is critically ill with multiple organ systems failure and requires high complexity decision making for assessment and support, frequent evaluation and titration of therapies, application of advanced monitoring technologies and extensive interpretation of multiple databases.   Critical Care Time devoted to patient care services described in this note is  45  Minutes. This time reflects time of care of this signee Dr Jennet Maduro. This critical care time does not reflect procedure time, or teaching time or supervisory time of PA/NP/Med student/Med Resident etc but could involve care discussion time.  Rush Farmer, M.D. [facility]. Pager: [phone]. After hours pager: [phone].

## 2018-05-14 NOTE — Transfer of Care (Signed)
Immediate Anesthesia Transfer of Care Note  Patient: Wendy [name]  Procedure(s) Performed: EXPLORATORY LAPAROTOMY (N/A Abdomen)  Patient Location: ICU  Anesthesia Type:General  Level of Consciousness: sedated, unresponsive and Patient remains intubated per anesthesia plan  Airway & Oxygen Therapy: Patient remains intubated per anesthesia plan and Patient placed on Ventilator (see vital sign flow sheet for setting)  Post-op Assessment: Report given to RN and Post -op Vital signs reviewed and stable  Post vital signs: Reviewed and stable  Last Vitals:  Vitals Value Taken Time  BP    Temp    Pulse    Resp 18 05/14/2018  7:51 PM  SpO2 99 % 05/14/2018  7:45 PM  Vitals shown include unvalidated device data.  Last Pain:  Vitals:   05/14/18 1659  TempSrc:   PainSc: Asleep         Complications: No apparent anesthesia complications

## 2018-05-15 ENCOUNTER — Inpatient Hospital Stay (HOSPITAL_COMMUNITY): Payer: 59

## 2018-05-15 ENCOUNTER — Encounter (HOSPITAL_COMMUNITY): Payer: Self-pay | Admitting: General Surgery

## 2018-05-15 DIAGNOSIS — K56609 Unspecified intestinal obstruction, unspecified as to partial versus complete obstruction: Secondary | ICD-10-CM

## 2018-05-15 LAB — BLOOD GAS, ARTERIAL
Acid-base deficit: 2.9 mmol/L — ABNORMAL HIGH (ref 0.0–2.0)
Bicarbonate: 22.2 mmol/L (ref 20.0–28.0)
FIO2: 0.4
MECHVT: 450 mL
O2 Saturation: 97.1 %
PEEP: 5 cmH2O
Patient temperature: 98.6
RATE: 16 resp/min
pCO2 arterial: 44.2 mmHg (ref 32.0–48.0)
pH, Arterial: 7.322 — ABNORMAL LOW (ref 7.350–7.450)
pO2, Arterial: 103 mmHg (ref 83.0–108.0)

## 2018-05-15 LAB — MAGNESIUM: MAGNESIUM: 1.9 mg/dL (ref 1.7–2.4)

## 2018-05-15 LAB — BASIC METABOLIC PANEL
Anion gap: 8 (ref 5–15)
Anion gap: 6 (ref 5–15)
Anion gap: 7 (ref 5–15)
Anion gap: 7 (ref 5–15)
BUN: 28 mg/dL — ABNORMAL HIGH (ref 6–20)
BUN: 27 mg/dL — ABNORMAL HIGH (ref 6–20)
BUN: 26 mg/dL — ABNORMAL HIGH (ref 6–20)
BUN: 25 mg/dL — ABNORMAL HIGH (ref 6–20)
CO2: 21 mmol/L — ABNORMAL LOW (ref 22–32)
CO2: 21 mmol/L — ABNORMAL LOW (ref 22–32)
CO2: 22 mmol/L (ref 22–32)
CO2: 21 mmol/L — ABNORMAL LOW (ref 22–32)
CREATININE: 1.52 mg/dL — AB (ref 0.44–1.00)
Calcium: 8.8 mg/dL — ABNORMAL LOW (ref 8.9–10.3)
Calcium: 8.6 mg/dL — ABNORMAL LOW (ref 8.9–10.3)
Calcium: 7.9 mg/dL — ABNORMAL LOW (ref 8.9–10.3)
Calcium: 8.2 mg/dL — ABNORMAL LOW (ref 8.9–10.3)
Chloride: 108 mmol/L (ref 98–111)
Chloride: 113 mmol/L — ABNORMAL HIGH (ref 98–111)
Chloride: 110 mmol/L (ref 98–111)
Chloride: 112 mmol/L — ABNORMAL HIGH (ref 98–111)
Creatinine, Ser: 1.39 mg/dL — ABNORMAL HIGH (ref 0.44–1.00)
Creatinine, Ser: 1.3 mg/dL — ABNORMAL HIGH (ref 0.44–1.00)
Creatinine, Ser: 1.24 mg/dL — ABNORMAL HIGH (ref 0.44–1.00)
GFR calc Af Amer: 44 mL/min — ABNORMAL LOW (ref >60)
GFR calc Af Amer: 53 mL/min — ABNORMAL LOW (ref >60)
GFR calc Af Amer: 56 mL/min — ABNORMAL LOW (ref >60)
GFR calc non Af Amer: 38 mL/min — ABNORMAL LOW (ref >60)
GFR calc non Af Amer: 42 mL/min — ABNORMAL LOW (ref >60)
GFR calc non Af Amer: 48 mL/min — ABNORMAL LOW (ref >60)
GFR, EST AFRICAN AMERICAN: 49 mL/min — AB (ref >60)
GFR, EST NON AFRICAN AMERICAN: 46 mL/min — AB (ref >60)
Glucose, Bld: 257 mg/dL — ABNORMAL HIGH (ref 70–99)
Glucose, Bld: 139 mg/dL — ABNORMAL HIGH (ref 70–99)
Glucose, Bld: 154 mg/dL — ABNORMAL HIGH (ref 70–99)
Glucose, Bld: 171 mg/dL — ABNORMAL HIGH (ref 70–99)
POTASSIUM: 3.6 mmol/L (ref 3.5–5.1)
Potassium: 3.7 mmol/L (ref 3.5–5.1)
Potassium: 4.1 mmol/L (ref 3.5–5.1)
Potassium: 3.7 mmol/L (ref 3.5–5.1)
Sodium: 137 mmol/L (ref 135–145)
Sodium: 140 mmol/L (ref 135–145)
Sodium: 139 mmol/L (ref 135–145)
Sodium: 140 mmol/L (ref 135–145)

## 2018-05-15 LAB — GLUCOSE, CAPILLARY
Glucose-Capillary: 113 mg/dL — ABNORMAL HIGH (ref 70–99)
Glucose-Capillary: 121 mg/dL — ABNORMAL HIGH (ref 70–99)
Glucose-Capillary: 126 mg/dL — ABNORMAL HIGH (ref 70–99)
Glucose-Capillary: 129 mg/dL — ABNORMAL HIGH (ref 70–99)
Glucose-Capillary: 129 mg/dL — ABNORMAL HIGH (ref 70–99)
Glucose-Capillary: 130 mg/dL — ABNORMAL HIGH (ref 70–99)
Glucose-Capillary: 136 mg/dL — ABNORMAL HIGH (ref 70–99)
Glucose-Capillary: 137 mg/dL — ABNORMAL HIGH (ref 70–99)
Glucose-Capillary: 138 mg/dL — ABNORMAL HIGH (ref 70–99)
Glucose-Capillary: 142 mg/dL — ABNORMAL HIGH (ref 70–99)
Glucose-Capillary: 143 mg/dL — ABNORMAL HIGH (ref 70–99)
Glucose-Capillary: 143 mg/dL — ABNORMAL HIGH (ref 70–99)
Glucose-Capillary: 144 mg/dL — ABNORMAL HIGH (ref 70–99)
Glucose-Capillary: 153 mg/dL — ABNORMAL HIGH (ref 70–99)
Glucose-Capillary: 181 mg/dL — ABNORMAL HIGH (ref 70–99)
Glucose-Capillary: 231 mg/dL — ABNORMAL HIGH (ref 70–99)
Glucose-Capillary: 263 mg/dL — ABNORMAL HIGH (ref 70–99)

## 2018-05-15 LAB — CBC
HCT: 35 % — ABNORMAL LOW (ref 36.0–46.0)
Hemoglobin: 10.8 g/dL — ABNORMAL LOW (ref 12.0–15.0)
MCH: 25.9 pg — ABNORMAL LOW (ref 26.0–34.0)
MCHC: 30.9 g/dL (ref 30.0–36.0)
MCV: 83.9 fL (ref 80.0–100.0)
Platelets: 275 10*3/uL (ref 150–400)
RBC: 4.17 MIL/uL (ref 3.87–5.11)
RDW: 15 % (ref 11.5–15.5)
WBC: 18.9 10*3/uL — ABNORMAL HIGH (ref 4.0–10.5)
nRBC: 0 % (ref 0.0–0.2)

## 2018-05-15 LAB — PHOSPHORUS: Phosphorus: 2.3 mg/dL — ABNORMAL LOW (ref 2.5–4.6)

## 2018-05-15 LAB — MRSA PCR SCREENING: MRSA by PCR: NEGATIVE

## 2018-05-15 MED ORDER — SODIUM CHLORIDE 0.9 % IV SOLN
2.0000 g | Freq: Three times a day (TID) | INTRAVENOUS | Status: DC
  Administered 2018-05-16 – 2018-05-18 (×8): 2 g via INTRAVENOUS
  Filled 2018-05-15 (×10): qty 2

## 2018-05-15 MED ORDER — INSULIN ASPART 100 UNIT/ML ~~LOC~~ SOLN
0.0000 [IU] | Freq: Three times a day (TID) | SUBCUTANEOUS | Status: DC
  Administered 2018-05-15 (×2): 2 [IU] via SUBCUTANEOUS

## 2018-05-15 MED ORDER — PANTOPRAZOLE SODIUM 40 MG IV SOLR
40.0000 mg | INTRAVENOUS | Status: DC
  Administered 2018-05-15 – 2018-05-31 (×17): 40 mg via INTRAVENOUS
  Filled 2018-05-15 (×17): qty 40

## 2018-05-15 MED ORDER — INSULIN GLARGINE 100 UNIT/ML ~~LOC~~ SOLN
15.0000 [IU] | Freq: Every day | SUBCUTANEOUS | Status: DC
  Administered 2018-05-15 – 2018-05-22 (×8): 15 [IU] via SUBCUTANEOUS
  Filled 2018-05-15 (×8): qty 0.15

## 2018-05-15 MED ORDER — SODIUM CHLORIDE 0.9 % IV BOLUS
1000.0000 mL | Freq: Once | INTRAVENOUS | Status: AC
  Administered 2018-05-15: 1000 mL via INTRAVENOUS

## 2018-05-15 MED ORDER — INSULIN GLARGINE 100 UNIT/ML ~~LOC~~ SOLN
35.0000 [IU] | Freq: Every day | SUBCUTANEOUS | Status: DC
  Filled 2018-05-15: qty 0.35

## 2018-05-15 MED ORDER — INSULIN ASPART 100 UNIT/ML ~~LOC~~ SOLN
0.0000 [IU] | SUBCUTANEOUS | Status: DC
  Administered 2018-05-15 – 2018-05-16 (×5): 2 [IU] via SUBCUTANEOUS
  Administered 2018-05-17: 3 [IU] via SUBCUTANEOUS
  Administered 2018-05-17 (×3): 2 [IU] via SUBCUTANEOUS

## 2018-05-15 MED ORDER — SODIUM CHLORIDE 0.9 % IV BOLUS: 1000.0000 mL | Freq: Once | INTRAVENOUS | Status: AC

## 2018-05-15 MED ORDER — INSULIN GLARGINE 100 UNIT/ML ~~LOC~~ SOLN: 15.0000 [IU] | Freq: Every day | SUBCUTANEOUS | Status: DC

## 2018-05-15 NOTE — Progress Notes (Signed)
[place] Progress Note Patient Name: Pheobe Sandiford DOB: 1960-08-27 MRN: [id number]   Date of Service  05/15/2018  HPI/Events of Note  Patient intubated and ventilated. Notified of need for stress ulcer prophylaxis.  eICU Interventions  Will order Protonix IV.      Intervention Category Intermediate Interventions: Best-practice therapies (e.g. DVT, beta blocker, etc.)  Sommer,Steven Eugene 05/15/2018, 6:49 AM

## 2018-05-15 NOTE — Progress Notes (Signed)
[facility] Progress Note  1 Day Post-Op   Assessment/Plan DKA - per medicine, improving  Sepsis - per primary SBO, Pneumoperitoneum, intussusception with necrosis & perforation - S/P ex lap, small bowel resection, closure with abthera vac, Dr. Grandville Silos, 01/17 - left in discontinuity, return to the OR wed 01/29 for possible closure  - cont NGT to LIWS  FEN: NPO, NGT,  VTE: SCD's ID: Eraxis, Cefepime & Flagyl 01/27>>     Vanc once 01/27 Foley: yes Follow up: TBD   DISPO: continue IV abx, continue NGT to LIWS, likely return to OR wednesday    LOS: 1 day    Subjective: CC: s/p ex lap  Pt is awake and alert on vent. Not really following commands  Objective: Vital signs in last 24 hours: Temp:  [98 F (36.7 C)-101.8 F (38.8 C)] 101.5 F (38.6 C) (01/27 2349) Pulse Rate:  [120-143] 120 (01/28 0545) Resp:  [11-43] 19 (01/28 0630) BP: (100-146)/(54-96) 128/54 (01/28 0737) SpO2:  [90 %-100 %] 100 % (01/28 0737) Arterial Line BP: (63-171)/(47-74) 143/54 (01/28 0630) FiO2 (%):  [40 %-80 %] 40 % (01/28 0737) Weight:  [113.4 kg-117.9 kg] 117.9 kg (01/28 0335) Last BM Date: (PTA)  Intake/Output from previous day: 01/27 0701 - 01/28 0700 In: 7973.9 [I.V.:3626.6; IV Piggyback:4347.3] Out: 3760 [Urine:1110; Emesis/NG output:150; Drains:400; Blood:100] Intake/Output this shift: Total I/O In: -  Out: 530 [Urine:330; Emesis/NG output:50; Drains:150]  PE: Gen:  Alert, NAD Card:  tachycardic Pulm:  On vent Abd: Soft, ND, vac in place with serosanguinous output, TTP with guarding Skin: no rashes noted, warm and dry   Anti-infectives: Anti-infectives (From admission, onward)   Start     Dose/Rate Route Frequency Ordered Stop   05/15/18 1500  ceFEPIme (MAXIPIME) 1 g in sodium chloride 0.9 % 100 mL IVPB     1 g 200 mL/hr over 30 Minutes Intravenous Every 24 hours 05/14/18 1512     05/15/18 0000  anidulafungin (ERAXIS) 100 mg in sodium chloride 0.9 % 100  mL IVPB     100 mg 78 mL/hr over 100 Minutes Intravenous Every 24 hours 05/14/18 1728     05/14/18 1800  anidulafungin (ERAXIS) 200 mg in sodium chloride 0.9 % 200 mL IVPB     200 mg 78 mL/hr over 200 Minutes Intravenous  Once 05/14/18 1751 05/14/18 2138   05/14/18 1745  vancomycin (VANCOCIN) 2,000 mg in sodium chloride 0.9 % 500 mL IVPB     2,000 mg 250 mL/hr over 120 Minutes Intravenous  Once 05/14/18 1741     05/14/18 1741  vancomycin variable dose per unstable renal function (pharmacist dosing)      Does not apply See admin instructions 05/14/18 1741     05/14/18 1515  ceFEPIme (MAXIPIME) 2 g in sodium chloride 0.9 % 100 mL IVPB     2 g 200 mL/hr over 30 Minutes Intravenous  Once 05/14/18 1512 05/14/18 1649   05/14/18 1515  metroNIDAZOLE (FLAGYL) IVPB 500 mg     500 mg 100 mL/hr over 60 Minutes Intravenous Every 8 hours 05/14/18 1512        Lab Results:  Recent Labs    05/14/18 2014 05/15/18 0328  WBC 14.8* 18.9*  HGB 11.4* 10.8*  HCT 35.5* 35.0*  PLT 256 275   BMET Recent Labs    05/15/18 0328 05/15/18 0628  NA 140 139  K 4.1 3.7  CL 113* 110  CO2 21* 22  GLUCOSE 139* 154*  BUN 27* 26*  CREATININE 1.39* 1.30*  CALCIUM 8.6* 7.9*   PT/INR Recent Labs    05/14/18 2014  LABPROT 17.4*  INR 1.44   CMP     Component Value Date/Time   NA 139 05/15/2018 0628   NA 144 12/25/2014 1536   K 3.7 05/15/2018 0628   CL 110 05/15/2018 0628   CO2 22 05/15/2018 0628   GLUCOSE 154 (H) 05/15/2018 0628   BUN 26 (H) 05/15/2018 0628   BUN 9 12/25/2014 1536   CREATININE 1.30 (H) 05/15/2018 0628   CREATININE 0.69 05/25/2017 1150   CALCIUM 7.9 (L) 05/15/2018 0628   PROT 5.1 (L) 05/14/2018 2014   PROT 6.7 12/25/2014 1536   ALBUMIN 2.7 (L) 05/14/2018 2014   ALBUMIN 4.4 12/25/2014 1536   AST 49 (H) 05/14/2018 2014   ALT 29 05/14/2018 2014   ALKPHOS 78 05/14/2018 2014   BILITOT 1.4 (H) 05/14/2018 2014   BILITOT 0.3 12/25/2014 1536   GFRNONAA 46 (L) 05/15/2018 0628    GFRNONAA 78 08/25/2016 1100   GFRAA 53 (L) 05/15/2018 0628   GFRAA >89 08/25/2016 1100   Lipase     Component Value Date/Time   LIPASE 37 05/14/2018 2014    Studies/Results: Dg Chest Port 1 View  Result Date: 05/15/2018 CLINICAL DATA:  Ventilator support EXAM: PORTABLE CHEST 1 VIEW COMPARISON:  05/14/2018 FINDINGS: Endotracheal tube tip is 2 cm above the carina. Nasogastric tube enters the stomach. Right internal jugular central line tip is in the proximal right atrium. No pneumothorax. Mild bilateral lower lobe atelectasis. IMPRESSION: Lines and tubes well positioned. Mild basilar atelectasis. Electronically Signed   By: Nelson Chimes M.D.   On: 05/15/2018 07:41   Dg Chest Portable 1 View  Result Date: 05/14/2018 CLINICAL DATA:  Abdominal pain for several days EXAM: PORTABLE CHEST 1 VIEW COMPARISON:  CT from earlier in the same day. FINDINGS: Elevation of the right hemidiaphragm is again seen. Cardiac shadow is within normal limits. Mild right basilar atelectatic changes are noted. No sizable effusion is seen. No bony abnormality is noted. IMPRESSION: Mild right basilar atelectasis. Electronically Signed   By: Inez Catalina M.D.   On: 05/14/2018 15:26   Ct Renal Stone Study  Result Date: 05/14/2018 CLINICAL DATA:  58 y/o F; nausea, vomiting, and abdominal pain for 3 days. EXAM: CT ABDOMEN AND PELVIS WITHOUT CONTRAST TECHNIQUE: Multidetector CT imaging of the abdomen and pelvis was performed following the standard protocol without IV contrast. COMPARISON:  None. FINDINGS: Lower chest: Platelike atelectasis in the lung bases. Hepatobiliary: No focal liver abnormality is seen. No gallstones, gallbladder wall thickening, or biliary dilatation. Pancreas: Unremarkable. No pancreatic ductal dilatation or surrounding inflammatory changes. Spleen: Normal in size without focal abnormality. Adrenals/Urinary Tract: Adrenal glands are unremarkable. Kidneys are normal, without renal calculi, focal lesion, or  hydronephrosis. Bladder is unremarkable. Stomach/Bowel: Severe diffuse small-bowel obstruction with transition at the level of the distal ileum in the right lower quadrant (series 3, image 65). Mild sigmoid diverticulosis without findings of acute diverticulitis. Otherwise normal appearance of the colon. Vascular/Lymphatic: Aortic atherosclerosis. No enlarged abdominal or pelvic lymph nodes. Reproductive: Negative. Other: There are a few tiny foci of pneumoperitoneum at the liver hilum, anterior abdominal wall, and below the diaphragm (series 3, image 12, 24, 27). Small volume of ascites. Musculoskeletal: No fracture is seen. Lumbar spondylosis with prominent facet arthropathy. IMPRESSION: 1. Severe diffuse small bowel obstruction with transition level of distal ileum in right lower quadrant. 2. Few punctate foci of pneumoperitoneum indicating small/micro perforation.  Small volume of ascites. These results were called by telephone at the time of interpretation on 05/14/2018 at 3:27 pm to Dr. Andy Gauss, who verbally acknowledged these results. Electronically Signed   By: Kristine Garbe M.D.   On: 05/14/2018 15:34      Kalman Drape , [facility] 05/15/2018, 8:26 AM  Pager: [phone] Mon-Wed, Friday 7:00am-4:30pm Thurs 7am-11:30am  Consults: [phone]

## 2018-05-15 NOTE — Progress Notes (Signed)
Pt temp 102.52F. ELINK called and made aware.

## 2018-05-15 NOTE — Progress Notes (Signed)
[place] Progress Note Patient Name: Miangel Flom DOB: [birth year] MRN: [id number]   Date of Service  05/15/2018  HPI/Events of Note  Diabetic with POC blood sugar checks and Rx tid even though pt is on the ventilator.  eICU Interventions  POC blood glucose monitoring and Rx interval changed to Q 4 hours.        Kerry Kass Chanika Byland 05/15/2018, 8:25 PM

## 2018-05-15 NOTE — Progress Notes (Signed)
[place] Progress Note Patient Name: Wendy [name] DOB: [birth year] MRN: [id number]   Date of Service  05/15/2018  HPI/Events of Note  Hypotension - BP = 104/46 with MAP = 61. CVP = 8.   eICU Interventions  Will order: 1. Bolus with 0.9 NaCl 1 liter IV over 1 hour now.      Intervention Category Major Interventions: Hypotension - evaluation and management  [name] 05/15/2018, 5:29 AM

## 2018-05-15 NOTE — Progress Notes (Signed)
PHARMACY NOTE:  ANTIMICROBIAL RENAL DOSAGE ADJUSTMENT  Current antimicrobial regimen includes a mismatch between antimicrobial dosage and estimated renal function.  As per policy approved by the Pharmacy & Therapeutics and Medical Executive Committees, the antimicrobial dosage will be adjusted accordingly.  Current antimicrobial dosage:  Cefepime 1g q24h  Indication: Sepsis  Renal Function:  Estimated Creatinine Clearance: 62.1 mL/min (A) (by C-G formula based on SCr of 1.24 mg/dL (H)). []      On intermittent HD, scheduled: []      On CRRT    Antimicrobial dosage has been changed to:  Cefepime 2g IV every 8 hours  Additional comments: SCr improved   Thank you for allowing pharmacy to be a part of this patient's care.  Brain Hilts, [facility] 05/15/2018 10:26 PM

## 2018-05-15 NOTE — Progress Notes (Signed)
Initial Nutrition Assessment  DOCUMENTATION CODES:   Morbid obesity  INTERVENTION:   Await return of bowel function; if remains on vent support, recommend initiation of TF as soon as medically able  Tube Feeding:  Vital High Protein at  65 ml/hr Provides 1560 kcals, 137 g of protein and 1310 mL of free water Meets 100% estimated calorie and protein needs  NUTRITION DIAGNOSIS:   Inadequate oral intake related to acute illness as evidenced by estimated needs.  GOAL:   Provide needs based on ASPEN/SCCM guidelines  MONITOR:   Vent status, Labs, Weight trends  REASON FOR ASSESSMENT:   Ventilator    ASSESSMENT:   58 yo female admitted with DKA, SBO with intussusception with necrosis and perforation s/p SB resection left in discontinuity on 1/27 with SB anastomosis and closure of abdominal fascia on 1/29. On vent postop. PMH includes HTN, DM, GERD, HLD   1/27 Ex Lap, SB resection, left in discontinuity, closure with abthera vac. Vent post-op 1/29 Return to OR, ex lap with SB anastomosis and closure of abdominal fascia  Patient is currently intubated on ventilator support Temp (24hrs), Avg:100.4 F (38 C), Min:98.9 F (37.2 C), Max:102.1 F (38.9 C)  Husband at bedside.   Per weight encounters, pt weight has been relatively stable over the past several years. Husband does not know how much pt weighs but reports he does not believe pt has lost any weight Admission wt 117.9; current wt 118.5 kg  Husband reports pt eating well up until Thursday/Friday last week when pt began experiencing GI distress; N/V. Pt also stopped taking her DM meds at this time thinking that the meds were causing the GI issues  NG tube to LIS with minimal output Wound vac with 975 mL out  Labs: Creatinine 1.08, BUN wdl, CBGs 115-137 Meds: D5-1/2 NS at 100 ml/hr, ss novolog, lantus  NUTRITION - FOCUSED PHYSICAL EXAM:    Most Recent Value  Orbital Region  No depletion  Upper Arm Region  No  depletion  Thoracic and Lumbar Region  No depletion  Buccal Region  No depletion  Temple Region  No depletion  Clavicle Bone Region  No depletion  Clavicle and Acromion Bone Region  No depletion  Scapular Bone Region  No depletion  Dorsal Hand  No depletion  Patellar Region  No depletion  Anterior Thigh Region  No depletion  Posterior Calf Region  No depletion  Edema (RD Assessment)  Mild       Diet Order:   Diet Order            Diet NPO time specified  Diet effective now              EDUCATION NEEDS:   Not appropriate for education at this time  Skin:  Skin Assessment: Reviewed RN Assessment  Last BM:  no documented BM  Height:   Ht Readings from Last 1 Encounters:  05/14/18 5\' 3"  (1.6 m)    Weight:   Wt Readings from Last 1 Encounters:  05/16/18 118.5 kg    Ideal Body Weight:  52.3 kg  BMI:  Body mass index is 46.28 kg/m.  Estimated Nutritional Needs:   Kcal:  0962-8366 kcals   Protein:  105-131 g   Fluid:  >/= 1.8 L    Kerman Passey MS, RD, LDN, CNSC [phone] Pager  [phone] Weekend/On-Call Pager

## 2018-05-15 NOTE — Progress Notes (Signed)
NAME:  Wendy [name], MRN:  [id number], DOB:  Jan 09, 1961, LOS: 1 ADMISSION DATE:  05/14/2018, CONSULTATION DATE: 05/14/2018 REFERRING MD:  Dr. Wilson Singer, CHIEF COMPLAINT:  Abd pain  Brief History   58 year old female who presented with 3-day history of abdominal pain, emesis, anorexia, and polyuria for the past 3 days.  Labs notable hyperglycemia 771 with anion gap of 21.  CT abdomen pelvis showed small bowel obstruction with pneumoperitoneum and she underwent ex lap with small bowel resection.  History of present illness   This is a 58 year old female with a history of morbid obesity diabetes, GERD and hyperlipidemia who came in yesterday with a 3-day history of vomiting and abdominal pain also noted to have polyuria and decreased appetite for the past 3 days.  She had not been taking any of her home diabetes medications as prescribed.  In the ER she was found to be febrile, tachycardic and normotensive.  Labs are notable for a glucose of some 71, anion gap of 21, corrected sodium 141 potassium, chloride 89, BUN 40, creatinine 2.18 (baseline around 0.69).  CT abdomen pelvis showed diffuse small bowel obstruction with pneumoperitoneum.  She was given 3 L normal saline and started on insulin drip.  Given cefepime and Flagyl.  While in the ER patient she had worsening of her mental status requiring intubation. Surgery was consulted and took her for a ex lap for they performed a small bowel resection.  Patient was admitted to the ICU due to her critical condition.  Past Medical History   Past Medical History:  Diagnosis Date  . Acute eczema   . Allergy   . Anemia   . Anxiety   . Chronic constipation with overflow   . Diabetes mellitus without complication ([place])   . GERD (gastroesophageal reflux disease)   . Herpetic vesicle in vagina   . Hyperlipidemia   . Hypertension   . Leg edema   . Morbid obesity ([place])   . Peri-menopause   . Snoring      Significant Hospital Events   1/27 > Admitted to  hospital > OR > ICU  Consults:  Surgery  Procedures:  1/27 > Ex lap with small bowel resection  Significant Diagnostic Tests:  1/27 CT abd/pelvis > SBO with pneumoperitoneum  Micro Data:  1/27 blood culture> 1/27 urine culture> 1/27 respiratory culture> 1/27 peritoneal culture> 1/27 MRSA> negative  Antimicrobials:  Metronidazole 1/27> Cefepime 1/27> Anidulafungin 1/27 >  Interim history/subjective:  Overnight patient was noted to have hypotension with a map of 62-65, she was given 2 l normal saline, a total of 7 L since she has been admitted.  She was also started on Protonix.  Today patient is currently intubated.  She is on the insulin drip and fentanyl drip.  She is awake but not following any commands.  No family at bedside.  Objective   Blood pressure (!) 122/58, pulse (!) 120, temperature (!) 101.5 F (38.6 C), temperature source Axillary, resp. rate 19, height 5' 3"  (1.6 m), weight 117.9 kg, last menstrual period 01/26/2017, SpO2 100 %. CVP:  [8 mmHg] 8 mmHg  Vent Mode: (P) PRVC FiO2 (%):  [40 %-80 %] 40 % Set Rate:  [16 bmp] (P) 16 bmp Vt Set:  [450 mL] (P) 450 mL PEEP:  [5 cmH20] (P) 5 cmH20 Plateau Pressure:  [18 cmH20-20 cmH20] (P) 20 cmH20   Intake/Output Summary (Last 24 hours) at 05/15/2018 0701 Last data filed at 05/15/2018 0600 Gross per 24 hour  Intake 7973.89  ml  Output 3760 ml  Net 4213.89 ml   Filed Weights   05/14/18 1436 05/14/18 2000 05/15/18 0335  Weight: 113.4 kg 117.9 kg 117.9 kg    Examination: General: Intubated, sedated, comfortable appearing HENT: Atraumatic, normocephalic, ET tube in place, right IJ in place Lungs: CTA, no wheezing, rhonchi or rales Cardiovascular: Tachycardic, no murmurs rubs or gallops Abdomen: Soft, open wound midline with wound VAC in place, serosanguineous fluid, decreased bowel sounds, no guarding Extremities: 1+ bilateral lower extremity edema, ED is in place Neuro: Sedated, not following commands GU:  Foley in place  Resolved Hospital Problem list     Assessment & Plan:   Small bowel obstruction, ischemic bowel S/p ex lap and small bowel resection Intubated for airway protection Sepsis Hypotension -Currently has open wound.  Patient had become hypotensive last night and has been given multiple boluses of normal saline. Blood pressures have improved but is still on the softer side, not requiring any pressors at this time. She is s/p 7 L fluids. We will follow-up blood, urine, and respiratory cultures. CXR has showed some bilateral atelectasis.  -Surgery plans to return to the operating room in 1 day per procedure note -Patient is intubated and has a central line in place -Continue fentanyl and Precedex for sedation, will not attempt to extubate until after her surgeries -Pulmonary hygiene -Continue cefepime and flagyl -MRSA negative -Discontinue vancomycin and Eraxis  DKA: History of DM: -Beta-hydroxybuterate up to 2.44. Urinalysis showed 20 ketones. Currently on insulin drip, AG has closed. CBGs WNL. Can transition to IV insulin today.  -Start Lantus 15, d/c drip after 2 hour overlap -Frequent CBGs, q1 hour now and decrease to q4 hours later -SSI  AKI: Cr at 1.3 today, down from 1.5 on admission -Strict I+Os, daily weights  Best practice:  Diet: NPO Pain/Anxiety/Delirium protocol (if indicated): Fentanyl PRN for RASS -1 to 0 VAP protocol (if indicated): yes DVT prophylaxis: SCDs GI prophylaxis: PPI Glucose control: Insulin drip Mobility: Bedrest Code Status: Full Family Communication: Will contact family Disposition: Remain in ICU  Labs   CBC: Recent Labs  Lab 05/14/18 1133  05/14/18 1743 05/14/18 1834 05/14/18 1840 05/14/18 2014 05/15/18 0328  WBC 22.5*  --   --   --   --  14.8* 18.9*  NEUTROABS 19.1*  --   --   --   --  12.0*  --   HGB 13.2   < > 11.6* 12.2 10.5* 11.4* 10.8*  HCT 41.7   < > 34.0* 36.0 31.0* 35.5* 35.0*  MCV 81.9  --   --   --   --  82.8  83.9  PLT 370  --   --   --   --  256 275   < > = values in this interval not displayed.    Basic Metabolic Panel: Recent Labs  Lab 05/14/18 2014 05/14/18 2223 05/15/18 0121 05/15/18 0328 05/15/18 0628  NA 137 137 137 140 139  K 3.4* 3.3* 3.7 4.1 3.7  CL 106 107 108 113* 110  CO2 19* 18* 21* 21* 22  GLUCOSE 339* 366* 257* 139* 154*  BUN 31* 29* 28* 27* 26*  CREATININE 1.59* 1.55* 1.52* 1.39* 1.30*  CALCIUM 8.9 9.0 8.8* 8.6* 7.9*  MG 1.8  --   --  1.9  --   PHOS 2.5  --   --  2.3*  --    GFR: Estimated Creatinine Clearance: 59.2 mL/min (A) (by C-G formula based on SCr of 1.3  mg/dL (H)). Recent Labs  Lab 05/14/18 1133 05/14/18 1648 05/14/18 2014 05/14/18 2047 05/14/18 2223 05/15/18 0328  PROCALCITON  --   --  25.12  --   --   --   WBC 22.5*  --  14.8*  --   --  18.9*  LATICACIDVEN  --  5.2*  --  1.2 1.3  --     Liver Function Tests: Recent Labs  Lab 05/14/18 2014  AST 49*  ALT 29  ALKPHOS 78  BILITOT 1.4*  PROT 5.1*  ALBUMIN 2.7*   Recent Labs  Lab 05/14/18 2014  LIPASE 37  AMYLASE 60   No results for input(s): AMMONIA in the last 168 hours.  ABG    Component Value Date/Time   PHART 7.322 (L) 05/15/2018 0420   PCO2ART 44.2 05/15/2018 0420   PO2ART 103 05/15/2018 0420   HCO3 22.2 05/15/2018 0420   TCO2 20 (L) 05/14/2018 1840   ACIDBASEDEF 2.9 (H) 05/15/2018 0420   O2SAT 97.1 05/15/2018 0420     Coagulation Profile: Recent Labs  Lab 05/14/18 2014  INR 1.44    Cardiac Enzymes: No results for input(s): CKTOTAL, CKMB, CKMBINDEX, TROPONINI in the last 168 hours.  HbA1C: HbA1c, POC (controlled diabetic range)  Date/Time Value Ref Range Status  02/06/2018 05:16 PM 8.2 (A) 0.0 - 7.0 % Final  11/06/2017 12:15 PM 9.7 (A) 0.0 - 7.0 % Final    CBG: Recent Labs  Lab 05/15/18 0231 05/15/18 0331 05/15/18 0423 05/15/18 0529 05/15/18 0628  GLUCAP 181* 130* 113* 136* 143*    Review of Systems:   Unable to obtain due to patients mental  status  Past Medical History  She,  has a past medical history of Acute eczema, Allergy, Anemia, Anxiety, Chronic constipation with overflow, Diabetes mellitus without complication ([place]), GERD (gastroesophageal reflux disease), Herpetic vesicle in vagina, Hyperlipidemia, Hypertension, Leg edema, Morbid obesity ([place]), Peri-menopause, and Snoring.   Surgical History    Past Surgical History:  Procedure Laterality Date  . CESAREAN SECTION  1993  . tonsillectomy and adnoidectomy    . WISDOM TOOTH EXTRACTION       Social History   reports that she has been smoking cigarettes. She started smoking about 47 years ago. She has smoked for the past 20.00 years. She has never used smokeless tobacco. She reports that she does not drink alcohol or use drugs.   Family History   Her family history includes Asthma in her mother and son; Autism in her son; Diabetes in her mother; Heart disease in her father and mother; Kidney disease in her paternal grandmother; Other in her father; Prostate cancer in her father. There is no history of Breast cancer, Colon cancer, Esophageal cancer, Pancreatic cancer, Rectal cancer, or Stomach cancer.   Allergies Allergies  Allergen Reactions  . Penicillins Swelling    Did it involve swelling of the face/tongue/throat, SOB, or low BP? Yes Did it involve sudden or severe rash/hives, skin peeling, or any reaction on the inside of your mouth or nose? No Did you need to seek medical attention at a hospital or doctor's office? Yes When did it last happen? unknown If all above answers are "NO", may proceed with cephalosporin use.      Home Medications  Prior to Admission medications   Medication Sig Start Date End Date Taking? Authorizing Provider  aspirin (ASPIRIN LOW DOSE) 81 MG chewable tablet Chew 1 tablet by mouth daily. 04/10/14  Yes [provider]  atorvastatin (LIPITOR) 40 MG  tablet Take 1 tablet (40 mg total) by mouth every evening. 02/06/18  Yes  Sowles, Drue Stager, MD  Cholecalciferol (VITAMIN D) 2000 units CAPS Take 1 capsule (2,000 Units total) by mouth daily. 03/25/16  Yes Sowles, Drue Stager, MD  Empagliflozin-metFORMIN HCl ER (SYNJARDY XR) 25-1000 MG TB24 Take 1 tablet by mouth daily. 02/06/18  Yes Sowles, Drue Stager, MD  fluticasone (FLONASE) 50 MCG/ACT nasal spray Place 2 sprays into both nostrils daily. Patient taking differently: Place 2 sprays into both nostrils daily as needed for allergies.  03/27/18  Yes Sowles, Drue Stager, MD  insulin degludec (TRESIBA) 100 UNIT/ML SOPN FlexTouch Pen INJECT 20 TO 50 UNITS INTO THE SKIN DAILY Patient taking differently: Inject 20-50 Units into the skin daily.  05/06/18  Yes Sowles, Drue Stager, MD  telmisartan-hydrochlorothiazide (MICARDIS HCT) 80-25 MG tablet TAKE 1 TABLET BY MOUTH DAILY. *INS ONLY PAYS FOR 30 DAYS* Patient taking differently: Take 1 tablet by mouth daily.  02/06/18  Yes Sowles, Drue Stager, MD  valACYclovir (VALTREX) 500 MG tablet Take 1 tablet (500 mg total) by mouth 3 (three) times daily as needed. And daily for prevention Patient taking differently: Take 500 mg by mouth 3 (three) times daily as needed (for prevention).  01/15/18  Yes Sowles, Drue Stager, MD  venlafaxine XR (EFFEXOR-XR) 150 MG 24 hr capsule Take 1 capsule (150 mg total) by mouth daily. 02/06/18  Yes Sowles, Drue Stager, MD  B-12, Methylcobalamin, 1000 MCG SUBL Place 1 tablet under the tongue daily. Patient not taking: Reported on 05/14/2018 03/25/16   Steele Sizer, MD  Blood Glucose Monitoring Suppl (ONE TOUCH ULTRA 2) w/Device KIT See admin instructions. 06/08/16   [provider]  Insulin Pen Needle (NOVOFINE) 32G X 6 MM MISC 1 each by Does not apply route daily. 11/06/17   Steele Sizer, MD  ONE TOUCH ULTRA TEST test strip CHECK FASTING BLOOD SUGARS TWICE DAILY 03/29/16   Steele Sizer, MD  [facility] DELICA LANCETS FINE MISC See admin instructions. 06/08/16   [provider]          Asencion Noble, M.D.  PGY1 Pager [phone] 05/15/2018 7:01 AM

## 2018-05-15 NOTE — Progress Notes (Signed)
Inpatient Diabetes Program Recommendations  AACE/ADA: New Consensus Statement on Inpatient Glycemic Control  Target Ranges:  Prepandial:   less than 140 mg/dL      Peak postprandial:   less than 180 mg/dL (1-2 hours)      Critically ill patients:  140 - 180 mg/dL  Results for Wendy [name], Wendy [name] (MRN [id number]) as of 05/15/2018 10:39  Ref. Range 05/15/2018 00:34 05/15/2018 01:23 05/15/2018 02:31 05/15/2018 03:31 05/15/2018 04:23 05/15/2018 05:29 05/15/2018 06:28 05/15/2018 08:04 05/15/2018 09:19  Glucose-Capillary Latest Ref Range: 70 - 99 mg/dL 263 (H) 231 (H) 181 (H) 130 (H) 113 (H) 136 (H) 143 (H) 144 (H) 143 (H)   Results for Wendy [name], Wendy [name] (MRN [id number]) as of 05/15/2018 10:39  Ref. Range 05/14/2018 11:33  Glucose Latest Ref Range: 70 - 99 mg/dL 711 (HH)   Review of Glycemic Control  Diabetes history: DM2 Outpatient Diabetes medications: Synjardy XR 25-1000 mg daily, Tresiba 20-50 units daily Current orders for Inpatient glycemic control: IV insulin drip per DKA order set  Inpatient Diabetes Program Recommendations:  Insulin at transition from IV to SQ insulin: Once MD is ready to transition from IV to SQ insulin, please consider using ICU Glycemic Control order set Phase 2 to order Lantus 15 units Q24H and CBGs with Novolog 1-3 units Q4H.  HgbA1C: Per office note on 02/06/18 by Dr. Ancil Boozer, A1C 8.2%.  Thanks, Barnie Alderman, RN, MSN, CDE Diabetes Coordinator Inpatient Diabetes Program [phone] (Team Pager from 8am to 5pm)

## 2018-05-15 NOTE — Progress Notes (Signed)
eLink Physician-Brief Progress Note Patient Name: Wendy [name] DOB: 01-25-1961 MRN: [id number]   Date of Service  05/15/2018  HPI/Events of Note  Hypotension - MAP = 62-65. CVP = 8.  eICU Interventions  Will order: 1. Bolus with 0.9 NaCl 1 liter IV over 1 hour now.      Intervention Category Major Interventions: Hypotension - evaluation and management  Renan Danese Eugene 05/15/2018, 2:20 AM

## 2018-05-16 ENCOUNTER — Inpatient Hospital Stay (HOSPITAL_COMMUNITY): Payer: 59 | Admitting: Certified Registered Nurse Anesthetist

## 2018-05-16 ENCOUNTER — Encounter (HOSPITAL_COMMUNITY)
Admission: EM | Disposition: A | Discharge status: Home Health | Payer: Self-pay | Source: Home / Self Care | Attending: Internal Medicine

## 2018-05-16 DIAGNOSIS — R0689 Other abnormalities of breathing: Secondary | ICD-10-CM

## 2018-05-16 HISTORY — PX: LAPAROTOMY: SHX154

## 2018-05-16 LAB — BASIC METABOLIC PANEL
Anion gap: 7 (ref 5–15)
BUN: 16 mg/dL (ref 6–20)
CHLORIDE: 113 mmol/L — AB (ref 98–111)
CO2: 21 mmol/L — ABNORMAL LOW (ref 22–32)
Calcium: 8.5 mg/dL — ABNORMAL LOW (ref 8.9–10.3)
Creatinine, Ser: 1.08 mg/dL — ABNORMAL HIGH (ref 0.44–1.00)
GFR calc Af Amer: >60 mL/min (ref >60)
GFR calc non Af Amer: 57 mL/min — ABNORMAL LOW (ref >60)
Glucose, Bld: 147 mg/dL — ABNORMAL HIGH (ref 70–99)
Potassium: 3.6 mmol/L (ref 3.5–5.1)
Sodium: 141 mmol/L (ref 135–145)

## 2018-05-16 LAB — CBC
HCT: 30.6 % — ABNORMAL LOW (ref 36.0–46.0)
HEMOGLOBIN: 9.6 g/dL — AB (ref 12.0–15.0)
MCH: 26.2 pg (ref 26.0–34.0)
MCHC: 31.4 g/dL (ref 30.0–36.0)
MCV: 83.4 fL (ref 80.0–100.0)
Platelets: 209 10*3/uL (ref 150–400)
RBC: 3.67 MIL/uL — ABNORMAL LOW (ref 3.87–5.11)
RDW: 15.2 % (ref 11.5–15.5)
WBC: 16.9 10*3/uL — ABNORMAL HIGH (ref 4.0–10.5)
nRBC: 0 % (ref 0.0–0.2)

## 2018-05-16 LAB — GLUCOSE, CAPILLARY
GLUCOSE-CAPILLARY: 107 mg/dL — AB (ref 70–99)
Glucose-Capillary: 115 mg/dL — ABNORMAL HIGH (ref 70–99)
Glucose-Capillary: 123 mg/dL — ABNORMAL HIGH (ref 70–99)
Glucose-Capillary: 96 mg/dL (ref 70–99)
Glucose-Capillary: 145 mg/dL — ABNORMAL HIGH (ref 70–99)
Glucose-Capillary: 136 mg/dL — ABNORMAL HIGH (ref 70–99)

## 2018-05-16 LAB — URINE CULTURE: Culture: NO GROWTH

## 2018-05-16 SURGERY — LAPAROTOMY, EXPLORATORY
Anesthesia: General

## 2018-05-16 MED ORDER — ROCURONIUM BROMIDE 50 MG/5ML IV SOSY
PREFILLED_SYRINGE | INTRAVENOUS | Status: AC
  Filled 2018-05-16: qty 5

## 2018-05-16 MED ORDER — ACETAMINOPHEN 10 MG/ML IV SOLN
1000.0000 mg | Freq: Four times a day (QID) | INTRAVENOUS | Status: DC
  Administered 2018-05-16 (×2): 1000 mg via INTRAVENOUS
  Filled 2018-05-16 (×3): qty 100

## 2018-05-16 MED ORDER — PHENYLEPHRINE HCL 10 MG/ML IJ SOLN
INTRAMUSCULAR | Status: DC | PRN
  Administered 2018-05-16: 80 ug via INTRAVENOUS

## 2018-05-16 MED ORDER — MIDAZOLAM HCL 5 MG/5ML IJ SOLN
INTRAMUSCULAR | Status: DC | PRN
  Administered 2018-05-16: 2 mg via INTRAVENOUS

## 2018-05-16 MED ORDER — EPHEDRINE 5 MG/ML INJ
INTRAVENOUS | Status: AC
  Filled 2018-05-16: qty 10

## 2018-05-16 MED ORDER — 0.9 % SODIUM CHLORIDE (POUR BTL) OPTIME
TOPICAL | Status: DC | PRN
  Administered 2018-05-16: 1000 mL

## 2018-05-16 MED ORDER — ROCURONIUM BROMIDE 100 MG/10ML IV SOLN
INTRAVENOUS | Status: DC | PRN
  Administered 2018-05-16: 50 mg via INTRAVENOUS

## 2018-05-16 MED ORDER — LACTATED RINGERS IV SOLN
INTRAVENOUS | Status: DC | PRN
  Administered 2018-05-16: 11:00:00 via INTRAVENOUS

## 2018-05-16 MED ORDER — MIDAZOLAM HCL 2 MG/2ML IJ SOLN
INTRAMUSCULAR | Status: AC
  Filled 2018-05-16: qty 2

## 2018-05-16 MED ORDER — ACETAMINOPHEN 10 MG/ML IV SOLN
1000.0000 mg | Freq: Four times a day (QID) | INTRAVENOUS | Status: DC | PRN
  Administered 2018-05-16 – 2018-05-17 (×2): 1000 mg via INTRAVENOUS
  Filled 2018-05-16 (×2): qty 100

## 2018-05-16 MED ORDER — DEXAMETHASONE SODIUM PHOSPHATE 10 MG/ML IJ SOLN
INTRAMUSCULAR | Status: AC
  Filled 2018-05-16: qty 2

## 2018-05-16 MED ORDER — PROPOFOL 10 MG/ML IV BOLUS
INTRAVENOUS | Status: AC
  Filled 2018-05-16: qty 20

## 2018-05-16 MED ORDER — PHENYLEPHRINE 40 MCG/ML (10ML) SYRINGE FOR IV PUSH (FOR BLOOD PRESSURE SUPPORT)
PREFILLED_SYRINGE | INTRAVENOUS | Status: AC
  Filled 2018-05-16: qty 10

## 2018-05-16 MED ORDER — LIDOCAINE 2% (20 MG/ML) 5 ML SYRINGE
INTRAMUSCULAR | Status: AC
  Filled 2018-05-16: qty 5

## 2018-05-16 MED ORDER — ONDANSETRON HCL 4 MG/2ML IJ SOLN
INTRAMUSCULAR | Status: AC
  Filled 2018-05-16: qty 2

## 2018-05-16 MED ORDER — SUCCINYLCHOLINE CHLORIDE 200 MG/10ML IV SOSY
PREFILLED_SYRINGE | INTRAVENOUS | Status: AC
  Filled 2018-05-16: qty 10

## 2018-05-16 SURGICAL SUPPLY — 51 items
BLADE CLIPPER SURG (BLADE) IMPLANT
CANISTER SUCT 3000ML PPV (MISCELLANEOUS) ×3 IMPLANT
CANISTER WOUNDNEG PRESSURE 500 (CANNISTER) ×2 IMPLANT
CHLORAPREP W/TINT 26ML (MISCELLANEOUS) ×1 IMPLANT
COVER SURGICAL LIGHT HANDLE (MISCELLANEOUS) ×3 IMPLANT
COVER WAND RF STERILE (DRAPES) ×1 IMPLANT
DRAPE LAPAROSCOPIC ABDOMINAL (DRAPES) ×3 IMPLANT
DRAPE WARM FLUID 44X44 (DRAPE) ×3 IMPLANT
DRSG OPSITE POSTOP 4X10 (GAUZE/BANDAGES/DRESSINGS) IMPLANT
DRSG OPSITE POSTOP 4X8 (GAUZE/BANDAGES/DRESSINGS) IMPLANT
DRSG VAC ATS LRG SENSATRAC (GAUZE/BANDAGES/DRESSINGS) ×2 IMPLANT
ELECT BLADE 6.5 EXT (BLADE) IMPLANT
ELECT CAUTERY BLADE 6.4 (BLADE) ×3 IMPLANT
ELECT REM PT RETURN 9FT ADLT (ELECTROSURGICAL) ×3
ELECTRODE REM PT RTRN 9FT ADLT (ELECTROSURGICAL) ×1 IMPLANT
GLOVE BIO SURGEON STRL SZ 6.5 (GLOVE) ×1 IMPLANT
GLOVE BIO SURGEON STRL SZ8 (GLOVE) ×5 IMPLANT
GLOVE BIO SURGEONS STRL SZ 6.5 (GLOVE) ×1
GLOVE BIOGEL PI IND STRL 6.5 (GLOVE) IMPLANT
GLOVE BIOGEL PI IND STRL 8 (GLOVE) ×1 IMPLANT
GLOVE BIOGEL PI INDICATOR 6.5 (GLOVE) ×2
GLOVE BIOGEL PI INDICATOR 8 (GLOVE) ×2
GOWN STRL REUS W/ TWL LRG LVL3 (GOWN DISPOSABLE) ×1 IMPLANT
GOWN STRL REUS W/ TWL XL LVL3 (GOWN DISPOSABLE) ×1 IMPLANT
GOWN STRL REUS W/TWL LRG LVL3 (GOWN DISPOSABLE) ×6
GOWN STRL REUS W/TWL XL LVL3 (GOWN DISPOSABLE) ×4
KIT BASIN OR (CUSTOM PROCEDURE TRAY) ×3 IMPLANT
KIT TURNOVER KIT B (KITS) ×3 IMPLANT
LIGASURE IMPACT 36 18CM CVD LR (INSTRUMENTS) IMPLANT
NS IRRIG 1000ML POUR BTL (IV SOLUTION) ×8 IMPLANT
PACK GENERAL/GYN (CUSTOM PROCEDURE TRAY) ×3 IMPLANT
PAD ARMBOARD 7.5X6 YLW CONV (MISCELLANEOUS) ×3 IMPLANT
PENCIL SMOKE EVACUATOR (MISCELLANEOUS) ×3 IMPLANT
RELOAD PROXIMATE 75MM BLUE (ENDOMECHANICALS) ×3 IMPLANT
RELOAD STAPLE 75 3.8 BLU REG (ENDOMECHANICALS) IMPLANT
SOLUTION BETADINE 4OZ (MISCELLANEOUS) ×2 IMPLANT
SPECIMEN JAR LARGE (MISCELLANEOUS) IMPLANT
SPONGE LAP 18X18 X RAY DECT (DISPOSABLE) IMPLANT
STAPLER GUN LINEAR PROX 60 (STAPLE) ×2 IMPLANT
STAPLER PROXIMATE 75MM BLUE (STAPLE) ×2 IMPLANT
STAPLER VISISTAT 35W (STAPLE) ×1 IMPLANT
SUCTION POOLE TIP (SUCTIONS) ×3 IMPLANT
SUT PDS AB 1 TP1 96 (SUTURE) ×6 IMPLANT
SUT VIC AB 2-0 SH 18 (SUTURE) ×5 IMPLANT
SUT VIC AB 3-0 SH 18 (SUTURE) ×3 IMPLANT
SUT VICRYL AB 2 0 TIES (SUTURE) ×3 IMPLANT
SUT VICRYL AB 3 0 TIES (SUTURE) ×3 IMPLANT
TOWEL OR 17X24 6PK STRL BLUE (TOWEL DISPOSABLE) ×3 IMPLANT
TOWEL OR 17X26 10 PK STRL BLUE (TOWEL DISPOSABLE) ×3 IMPLANT
TRAY FOLEY MTR SLVR 16FR STAT (SET/KITS/TRAYS/PACK) IMPLANT
YANKAUER SUCT BULB TIP NO VENT (SUCTIONS) ×2 IMPLANT

## 2018-05-16 NOTE — Op Note (Signed)
Preoperative diagnosis: Open abdomen secondary to small bowel intussusception and subsequent laparotomy and small bowel resection left in discontinuity  Postoperative diagnosis same  Procedure: Exporter laparotomy with small bowel anastomosis and closure of abdominal fascia  Surgeon: Erroll Luna, MD  Assistant: Claris Gower PA   Anesthesia: General  EBL: 10 cc  Specimen: None  Drains: None  Indications for procedure the patient is a 58 year old female admitted 48 hours ago secondary to intussusception and small bowel necrosis.  She underwent emergent laparotomy and small bowel resection.  She was in severe DKA and was left open and taken back to the ICU for resuscitation.  She is stabilized and returns today for second look procedure and possible closure with anastomosis.  I discussed the case with her husband as well as risks and benefits of surgery and alternatives.The procedure has been discussed with the patient.  Alternative therapies have been discussed with the patient.  Operative risks include bleeding,  Infection,  Organ injury,  Nerve injury,  Blood vessel injury,  DVT,  Pulmonary embolism,  Death,  And possible reoperation.  Medical management risks include worsening of present situation.  The success of the procedure is 50 -90 % at treating patients symptoms.  The patient understands and agrees to proceed.  Description of procedure: The patient was taken back from the ICU to the operating room.  She was placed on the operating table supine.  She was already on a ventilator.  After induction of general anesthesia, the vacuum VAC dressing was removed.  Timeout was done.  We then prepped and draped in sterile fashion.  We does explore the abdomen.  Small bowel was normal.  The stapler to the ileum was identified in the distal ileum identified.  Remainder of the colon and small bowel were grossly normal.  Rectum was normal.  Stomach liver and gallbladder all normal.  We then created  dysfunctional end-to-end side-to-side anastomosis with a GIA 75 stapler and TA 60.,  Mesenteric defect closed with 2-0 Vicryl.  There is no tension of the anastomosis.  I tested the anastomosis by milking contents from proximal distal with no leakage.  There is ample opening between the anastomosis.  Attention was placed back in the abdominal cavity without twisting.  Irrigation was then used and suctioned out.  Fascia closed with double-stranded #1.  Wound VAC placed.  All final counts found to be correct.  Patient was then taken back to the ICU in critical but stable condition.

## 2018-05-16 NOTE — Progress Notes (Signed)
NAME:  Wendy [name], MRN:  [id number], DOB:  April 28, 1960, LOS: 2 ADMISSION DATE:  05/14/2018, CONSULTATION DATE: 05/14/2018 REFERRING MD:  Dr. Wilson Singer, CHIEF COMPLAINT:  Abd pain  Brief History   58 year old female who presented with 3-day history of abdominal pain, emesis, anorexia, and polyuria for the past 3 days.  Labs notable hyperglycemia 771 with anion gap of 21.  CT abdomen pelvis showed small bowel obstruction with pneumoperitoneum and she underwent ex lap with small bowel resection.  History of present illness   This is a 57 year old female with a history of morbid obesity diabetes, GERD and hyperlipidemia who came in yesterday with a 3-day history of vomiting and abdominal pain also noted to have polyuria and decreased appetite for the past 3 days.  She had not been taking any of her home diabetes medications as prescribed.  In the ER she was found to be febrile, tachycardic and normotensive.  Labs are notable for a glucose of some 71, anion gap of 21, corrected sodium 141 potassium, chloride 89, BUN 40, creatinine 2.18 (baseline around 0.69).  CT abdomen pelvis showed diffuse small bowel obstruction with pneumoperitoneum.  She was given 3 L normal saline and started on insulin drip.  Given cefepime and Flagyl.  While in the ER patient she had worsening of her mental status requiring intubation. Surgery was consulted and took her for a ex lap for they performed a small bowel resection.  Patient was admitted to the ICU due to her critical condition.  Past Medical History   Past Medical History:  Diagnosis Date  . Acute eczema   . Allergy   . Anemia   . Anxiety   . Chronic constipation with overflow   . Diabetes mellitus without complication ([place])   . GERD (gastroesophageal reflux disease)   . Herpetic vesicle in vagina   . Hyperlipidemia   . Hypertension   . Leg edema   . Morbid obesity ([place])   . Peri-menopause   . Snoring      Significant Hospital Events   1/27 > Admitted to  hospital > OR > ICU  Consults:  Surgery  Procedures:  1/27 > Ex lap with small bowel resection  Significant Diagnostic Tests:  1/27 CT abd/pelvis > SBO with pneumoperitoneum  Micro Data:  1/27 blood culture> NGTD 1/27 urine culture> No growth 1/27 respiratory culture> 1/27 peritoneal culture> NGTD 1/27 MRSA> negative  Antimicrobials:  Metronidazole 1/27> Cefepime 1/27> Anidulafungin 1/27 > 1/28  Interim history/subjective:  Overnight patient was noted to have a fever up to 102, blood cultures were drawn and patient was given ofirmev.  Today patient was resting comfortably. Awake and alert, not following commands. Husband was at bedside. Patient is scheduled to return to surgery today for possible closure.   Objective   Blood pressure 105/63, pulse (!) 101, temperature 100.2 F (37.9 C), temperature source Axillary, resp. rate 16, height 5' 3"  (1.6 m), weight 118.5 kg, last menstrual period 01/26/2017, SpO2 100 %. CVP:  [14 mmHg] 14 mmHg  Vent Mode: PRVC FiO2 (%):  [40 %] 40 % Set Rate:  [16 bmp] 16 bmp Vt Set:  [450 mL] 450 mL PEEP:  [5 cmH20] 5 cmH20 Plateau Pressure:  [16 cmH20-21 cmH20] 17 cmH20   Intake/Output Summary (Last 24 hours) at 05/16/2018 0645 Last data filed at 05/16/2018 0600 Gross per 24 hour  Intake 2886.84 ml  Output 2450 ml  Net 436.84 ml   Filed Weights   05/14/18 2000 05/15/18 0335 05/16/18 1696  Weight: 117.9 kg 117.9 kg 118.5 kg    Examination: General: Intubated, sedated, comfortable appearing HENT: Atraumatic, normocephalic, ET tube in place, right IJ in place Lungs: CTA, no wheezing, rhonchi or rales Cardiovascular: Tachycardic, no murmurs rubs or gallops Abdomen: Soft, open wound midline with wound VAC in place, serosanguineous fluid, decreased bowel sounds, no guarding Extremities: 1+ bilateral lower extremity edema, SCDs in place Neuro: Sedated, awake, not following commands GU: Foley in place  Resolved Hospital Problem list      Assessment & Plan:   Small bowel obstruction, ischemic bowel S/p ex lap and small bowel resection Intubated for airway protection Sepsis Hypotension -Currently has open wound. She is intubated and is on low vent settings. She is sedated with fentanyl now and will be returning to the OR today for possible closure of his midline wound.  Lungs sound clear to auscultation, however will not consider extubation until after surgical procedures are done.  Not requiring any vasopressors at this time.  And had a fever up to 102 last night, leukocytosis has improved to 16 today.  Blood cultures are pending.  -Patient is intubated and has a central line in place -Continue fentanyl for sedation, RASS 0 to -1 -Pulmonary hygiene -Continue cefepime and flagyl -MRSA negative -Return to OR today per surgery  DKA: History of DM: -Beta-hydroxybuterate up to 2.44. Urinalysis showed 20 ketones. Currently on insulin drip, AG has closed. CBGs WNL.  Insulin drip was transitioned to long-acting Insulin yesterday. Patients CBGs have been around 120s. She is currently NPO for the procedure today.  -Continue Lantus 15 -Frequent CBGs, every 4 hours -SSI  AKI: Cr at 1.08 today, down from 1.5 on admission.  This was likely secondary to a prerenal azotemia from her abscess and hypotension on admission. -Strict I+Os, daily weights  Best practice:  Diet: NPO Pain/Anxiety/Delirium protocol (if indicated): Fentanyl for RASS -1 to 0 VAP protocol (if indicated): yes DVT prophylaxis: SCDs GI prophylaxis: PPI Glucose control: Lantus and SSI Mobility: Bedrest Code Status: Full Family Communication: Spoke with husband at bedside Disposition: Remain in ICU  Labs   CBC: Recent Labs  Lab 05/14/18 1133  05/14/18 1834 05/14/18 1840 05/14/18 2014 05/15/18 0328 05/16/18 0318  WBC 22.5*  --   --   --  14.8* 18.9* 16.9*  NEUTROABS 19.1*  --   --   --  12.0*  --   --   HGB 13.2   < > 12.2 10.5* 11.4* 10.8* 9.6*   HCT 41.7   < > 36.0 31.0* 35.5* 35.0* 30.6*  MCV 81.9  --   --   --  82.8 83.9 83.4  PLT 370  --   --   --  256 275 209   < > = values in this interval not displayed.    Basic Metabolic Panel: Recent Labs  Lab 05/14/18 2014  05/15/18 0121 05/15/18 0328 05/15/18 0628 05/15/18 1037 05/16/18 0318  NA 137   < > 137 140 139 140 141  K 3.4*   < > 3.7 4.1 3.7 3.6 3.6  CL 106   < > 108 113* 110 112* 113*  CO2 19*   < > 21* 21* 22 21* 21*  GLUCOSE 339*   < > 257* 139* 154* 171* 147*  BUN 31*   < > 28* 27* 26* 25* 16  CREATININE 1.59*   < > 1.52* 1.39* 1.30* 1.24* 1.08*  CALCIUM 8.9   < > 8.8* 8.6* 7.9* 8.2* 8.5*  MG  1.8  --   --  1.9  --   --   --   PHOS 2.5  --   --  2.3*  --   --   --    < > = values in this interval not displayed.   GFR: Estimated Creatinine Clearance: 71.5 mL/min (A) (by C-G formula based on SCr of 1.08 mg/dL (H)). Recent Labs  Lab 05/14/18 1133 05/14/18 1648 05/14/18 2014 05/14/18 2047 05/14/18 2223 05/15/18 0328 05/16/18 0318  PROCALCITON  --   --  25.12  --   --   --   --   WBC 22.5*  --  14.8*  --   --  18.9* 16.9*  LATICACIDVEN  --  5.2*  --  1.2 1.3  --   --     Liver Function Tests: Recent Labs  Lab 05/14/18 2014  AST 49*  ALT 29  ALKPHOS 78  BILITOT 1.4*  PROT 5.1*  ALBUMIN 2.7*   Recent Labs  Lab 05/14/18 2014  LIPASE 37  AMYLASE 60   No results for input(s): AMMONIA in the last 168 hours.  ABG    Component Value Date/Time   PHART 7.322 (L) 05/15/2018 0420   PCO2ART 44.2 05/15/2018 0420   PO2ART 103 05/15/2018 0420   HCO3 22.2 05/15/2018 0420   TCO2 20 (L) 05/14/2018 1840   ACIDBASEDEF 2.9 (H) 05/15/2018 0420   O2SAT 97.1 05/15/2018 0420     Coagulation Profile: Recent Labs  Lab 05/14/18 2014  INR 1.44    Cardiac Enzymes: No results for input(s): CKTOTAL, CKMB, CKMBINDEX, TROPONINI in the last 168 hours.  HbA1C: HbA1c, POC (controlled diabetic range)  Date/Time Value Ref Range Status  02/06/2018 05:16 PM 8.2  (A) 0.0 - 7.0 % Final  11/06/2017 12:15 PM 9.7 (A) 0.0 - 7.0 % Final    CBG: Recent Labs  Lab 05/15/18 1452 05/15/18 1620 05/15/18 1932 05/15/18 2346 05/16/18 0311  GLUCAP 126* 137* 129* 129* 115*    Review of Systems:   Unable to obtain due to patients mental status  Past Medical History  She,  has a past medical history of Acute eczema, Allergy, Anemia, Anxiety, Chronic constipation with overflow, Diabetes mellitus without complication ([place]), GERD (gastroesophageal reflux disease), Herpetic vesicle in vagina, Hyperlipidemia, Hypertension, Leg edema, Morbid obesity ([place]), Peri-menopause, and Snoring.   Surgical History    Past Surgical History:  Procedure Laterality Date  . CESAREAN SECTION  1993  . LAPAROTOMY N/A 05/14/2018   Procedure: EXPLORATORY LAPAROTOMY;  Surgeon: Georganna Skeans, MD;  Location: [place];  Service: General;  Laterality: N/A;  . tonsillectomy and adnoidectomy    . WISDOM TOOTH EXTRACTION       Social History   reports that she has been smoking cigarettes. She started smoking about 47 years ago. She has smoked for the past 20.00 years. She has never used smokeless tobacco. She reports that she does not drink alcohol or use drugs.   Family History   Her family history includes Asthma in her mother and son; Autism in her son; Diabetes in her mother; Heart disease in her father and mother; Kidney disease in her paternal grandmother; Other in her father; Prostate cancer in her father. There is no history of Breast cancer, Colon cancer, Esophageal cancer, Pancreatic cancer, Rectal cancer, or Stomach cancer.   Allergies Allergies  Allergen Reactions  . Penicillins Swelling    Did it involve swelling of the face/tongue/throat, SOB, or low BP? Yes Did it involve  sudden or severe rash/hives, skin peeling, or any reaction on the inside of your mouth or nose? No Did you need to seek medical attention at a hospital or doctor's office? Yes When did it last happen?  unknown If all above answers are "NO", may proceed with cephalosporin use.      Home Medications  Prior to Admission medications   Medication Sig Start Date End Date Taking? Authorizing Provider  aspirin (ASPIRIN LOW DOSE) 81 MG chewable tablet Chew 1 tablet by mouth daily. 04/10/14  Yes [provider]  atorvastatin (LIPITOR) 40 MG tablet Take 1 tablet (40 mg total) by mouth every evening. 02/06/18  Yes Sowles, Drue Stager, MD  Cholecalciferol (VITAMIN D) 2000 units CAPS Take 1 capsule (2,000 Units total) by mouth daily. 03/25/16  Yes Sowles, Drue Stager, MD  Empagliflozin-metFORMIN HCl ER (SYNJARDY XR) 25-1000 MG TB24 Take 1 tablet by mouth daily. 02/06/18  Yes Sowles, Drue Stager, MD  fluticasone (FLONASE) 50 MCG/ACT nasal spray Place 2 sprays into both nostrils daily. Patient taking differently: Place 2 sprays into both nostrils daily as needed for allergies.  03/27/18  Yes Sowles, Drue Stager, MD  insulin degludec (TRESIBA) 100 UNIT/ML SOPN FlexTouch Pen INJECT 20 TO 50 UNITS INTO THE SKIN DAILY Patient taking differently: Inject 20-50 Units into the skin daily.  05/06/18  Yes Sowles, Drue Stager, MD  telmisartan-hydrochlorothiazide (MICARDIS HCT) 80-25 MG tablet TAKE 1 TABLET BY MOUTH DAILY. *INS ONLY PAYS FOR 30 DAYS* Patient taking differently: Take 1 tablet by mouth daily.  02/06/18  Yes Sowles, Drue Stager, MD  valACYclovir (VALTREX) 500 MG tablet Take 1 tablet (500 mg total) by mouth 3 (three) times daily as needed. And daily for prevention Patient taking differently: Take 500 mg by mouth 3 (three) times daily as needed (for prevention).  01/15/18  Yes Sowles, Drue Stager, MD  venlafaxine XR (EFFEXOR-XR) 150 MG 24 hr capsule Take 1 capsule (150 mg total) by mouth daily. 02/06/18  Yes Sowles, Drue Stager, MD  B-12, Methylcobalamin, 1000 MCG SUBL Place 1 tablet under the tongue daily. Patient not taking: Reported on 05/14/2018 03/25/16   Steele Sizer, MD  Blood Glucose Monitoring Suppl (ONE TOUCH ULTRA 2)  w/Device KIT See admin instructions. 06/08/16   [provider]  Insulin Pen Needle (NOVOFINE) 32G X 6 MM MISC 1 each by Does not apply route daily. 11/06/17   Steele Sizer, MD  ONE TOUCH ULTRA TEST test strip CHECK FASTING BLOOD SUGARS TWICE DAILY 03/29/16   Steele Sizer, MD  [facility] DELICA LANCETS FINE MISC See admin instructions. 06/08/16   [provider]          Asencion Noble, M.D. PGY1 Pager [phone] 05/16/2018 6:45 AM

## 2018-05-16 NOTE — Transfer of Care (Signed)
Immediate Anesthesia Transfer of Care Note  Patient: Wendy [name]  Procedure(s) Performed: EXPLORATORY LAPAROTOMY with reanatomosis and fascia closure with wound vacum application (N/A )  Patient Location: ICU  Anesthesia Type:General  Level of Consciousness: Patient remains intubated per anesthesia plan  Airway & Oxygen Therapy: Patient remains intubated per anesthesia plan and Patient placed on Ventilator (see vital sign flow sheet for setting)  Post-op Assessment: Report given to RN and Post -op Vital signs reviewed and stable  Post vital signs: Reviewed and stable  Last Vitals:  Vitals Value Taken Time  BP    Temp    Pulse 113 05/16/2018 12:49 PM  Resp 16 05/16/2018 12:55 PM  SpO2 100 % 05/16/2018 12:49 PM  Vitals shown include unvalidated device data.  Last Pain:  Vitals:   05/16/18 0744  TempSrc: Oral  PainSc:          Complications: No apparent anesthesia complications

## 2018-05-16 NOTE — Progress Notes (Signed)
[place] Progress Note Patient Name: Wendy [name] DOB: [birth year] MRN: [id number]   Date of Service  05/16/2018  HPI/Events of Note  Temp spike to 102  eICU Interventions  Blood cultures x 2, Ofirmev 650 mg iv Q 4 hours prn temp > 101        [name] U [name] 05/16/2018, 12:15 AM

## 2018-05-16 NOTE — Anesthesia Postprocedure Evaluation (Signed)
Anesthesia Post Note  Patient: Wendy [name]  Procedure(s) Performed: EXPLORATORY LAPAROTOMY with reanatomosis and fascia closure with wound vacum application (N/A )     Patient location during evaluation: PACU Anesthesia Type: General Level of consciousness: awake and alert Pain management: pain level controlled Vital Signs Assessment: post-procedure vital signs reviewed and stable Respiratory status: spontaneous breathing, nonlabored ventilation and respiratory function stable Cardiovascular status: blood pressure returned to baseline and stable Postop Assessment: no apparent nausea or vomiting Anesthetic complications: no    Last Vitals:  Vitals:   05/16/18 1223 05/16/18 1300  BP: 136/82 (!) 148/108  Pulse: (!) 102   Resp: 16 17  Temp:    SpO2: 100%     Last Pain:  Vitals:   05/16/18 0744  TempSrc: Oral  PainSc:                  Lynda Rainwater

## 2018-05-16 NOTE — Anesthesia Preprocedure Evaluation (Signed)
Anesthesia Evaluation  Patient identified by MRN, date of birth, ID band Patient awake    Reviewed: Unable to perform ROS - Chart review onlyPreop documentation limited or incomplete due to emergent nature of procedure.  Airway Mallampati: III  TM Distance: >3 FB Neck ROM: Full    Dental  (+) Teeth Intact   Pulmonary Current Smoker,    breath sounds clear to auscultation       Cardiovascular hypertension,  Rhythm:Regular Rate:Tachycardia     Neuro/Psych Anxiety    GI/Hepatic GERD  ,ischemic bowel   Endo/Other  diabetes, Poorly Controlled, Type 2, Insulin DependentDKA  Renal/GU Renal disease     Musculoskeletal   Abdominal   Peds  Hematology  (+) anemia ,   Anesthesia Other Findings   Reproductive/Obstetrics                             Anesthesia Physical  Anesthesia Plan  ASA: IV and emergent  Anesthesia Plan: General   Post-op Pain Management:    Induction: Intravenous and Rapid sequence  PONV Risk Score and Plan: 2 and Ondansetron and Treatment may vary due to age or medical condition  Airway Management Planned: Oral ETT  Additional Equipment: Arterial line  Intra-op Plan:   Post-operative Plan: Possible Post-op intubation/ventilation  Informed Consent: I have reviewed the patients History and Physical, chart, labs and discussed the procedure including the risks, benefits and alternatives for the proposed anesthesia with the patient or authorized representative who has indicated his/her understanding and acceptance.     Dental advisory given and Only emergency history available  Plan Discussed with: CRNA and Surgeon  Anesthesia Plan Comments:         Anesthesia Quick Evaluation

## 2018-05-16 NOTE — Progress Notes (Signed)
[facility]/Trauma Progress Note  2 Days Post-Op   Assessment/Plan DKA - per medicine, improving  Sepsis - per primary SBO, Pneumoperitoneum, intussusception with necrosis & perforation - S/P ex lap, small bowel resection, closure with abthera vac, Dr. Grandville Silos, 01/17 - left in discontinuity, return to the OR today for hopeful closure  - cont NGT to LIWS  FEN: NPO, NGT,  VTE: SCD's ID: Eraxis 01/27-01/28, Vanc once 01/27, Cefepime & Flagyl 01/27>>      Foley: yes Follow up: TBD   DISPO: continue IV abx, continue NGT to LIWS, OR today   LOS: 2 days    Subjective: CC: s/p ex lap, open abdomen  Pt is on vent, awake and alert. Husband at bedside. Answered his questions. Pathology has not been completed.   Objective: Vital signs in last 24 hours: Temp:  [98.9 F (37.2 C)-102.1 F (38.9 C)] 98.9 F (37.2 C) (01/29 0744) Pulse Rate:  [99-129] 111 (01/29 0900) Resp:  [14-30] 17 (01/29 0900) BP: (101-154)/(49-83) 124/72 (01/29 0900) SpO2:  [87 %-100 %] 100 % (01/29 0900) Arterial Line BP: (62-147)/(49-75) 114/58 (01/28 1930) FiO2 (%):  [40 %] 40 % (01/29 0736) Weight:  [118.5 kg] 118.5 kg (01/29 0412) Last BM Date: (PTA)  Intake/Output from previous day: 01/28 0701 - 01/29 0700 In: 2401.2 [I.V.:1646.2; IV Piggyback:754.9] Out: 2450 [Urine:1425; Emesis/NG output:50; Drains:975] Intake/Output this shift: Total I/O In: 133.9 [I.V.:28.6; IV Piggyback:105.3] Out: 125 [Urine:50; Emesis/NG output:50; Drains:25]  PE: Gen:  Alert, NAD Card:  tachycardic Pulm:  On vent Abd: Soft, ND, vac in place with serosanguinous output Skin: no rashes noted, warm and dry   Anti-infectives: Anti-infectives (From admission, onward)   Start     Dose/Rate Route Frequency Ordered Stop   05/15/18 2333  ceFEPIme (MAXIPIME) 2 g in sodium chloride 0.9 % 100 mL IVPB     2 g 200 mL/hr over 30 Minutes Intravenous Every 8 hours 05/15/18 2226     05/15/18 1500  ceFEPIme (MAXIPIME)  1 g in sodium chloride 0.9 % 100 mL IVPB  Status:  Discontinued     1 g 200 mL/hr over 30 Minutes Intravenous Every 24 hours 05/14/18 1512 05/15/18 2226   05/15/18 0000  anidulafungin (ERAXIS) 100 mg in sodium chloride 0.9 % 100 mL IVPB  Status:  Discontinued     100 mg 78 mL/hr over 100 Minutes Intravenous Every 24 hours 05/14/18 1728 05/15/18 1159   05/14/18 1800  anidulafungin (ERAXIS) 200 mg in sodium chloride 0.9 % 200 mL IVPB     200 mg 78 mL/hr over 200 Minutes Intravenous  Once 05/14/18 1751 05/14/18 2138   05/14/18 1745  vancomycin (VANCOCIN) 2,000 mg in sodium chloride 0.9 % 500 mL IVPB  Status:  Discontinued     2,000 mg 250 mL/hr over 120 Minutes Intravenous  Once 05/14/18 1741 05/15/18 1159   05/14/18 1741  vancomycin variable dose per unstable renal function (pharmacist dosing)  Status:  Discontinued      Does not apply See admin instructions 05/14/18 1741 05/15/18 1159   05/14/18 1515  ceFEPIme (MAXIPIME) 2 g in sodium chloride 0.9 % 100 mL IVPB     2 g 200 mL/hr over 30 Minutes Intravenous  Once 05/14/18 1512 05/14/18 1649   05/14/18 1515  metroNIDAZOLE (FLAGYL) IVPB 500 mg     500 mg 100 mL/hr over 60 Minutes Intravenous Every 8 hours 05/14/18 1512        Lab Results:  Recent Labs    05/15/18  0328 05/16/18 0318  WBC 18.9* 16.9*  HGB 10.8* 9.6*  HCT 35.0* 30.6*  PLT 275 209   BMET Recent Labs    05/15/18 1037 05/16/18 0318  NA 140 141  K 3.6 3.6  CL 112* 113*  CO2 21* 21*  GLUCOSE 171* 147*  BUN 25* 16  CREATININE 1.24* 1.08*  CALCIUM 8.2* 8.5*   PT/INR Recent Labs    05/14/18 2014  LABPROT 17.4*  INR 1.44   CMP     Component Value Date/Time   NA 141 05/16/2018 0318   NA 144 12/25/2014 1536   K 3.6 05/16/2018 0318   CL 113 (H) 05/16/2018 0318   CO2 21 (L) 05/16/2018 0318   GLUCOSE 147 (H) 05/16/2018 0318   BUN 16 05/16/2018 0318   BUN 9 12/25/2014 1536   CREATININE 1.08 (H) 05/16/2018 0318   CREATININE 0.69 05/25/2017 1150   CALCIUM  8.5 (L) 05/16/2018 0318   PROT 5.1 (L) 05/14/2018 2014   PROT 6.7 12/25/2014 1536   ALBUMIN 2.7 (L) 05/14/2018 2014   ALBUMIN 4.4 12/25/2014 1536   AST 49 (H) 05/14/2018 2014   ALT 29 05/14/2018 2014   ALKPHOS 78 05/14/2018 2014   BILITOT 1.4 (H) 05/14/2018 2014   BILITOT 0.3 12/25/2014 1536   GFRNONAA 57 (L) 05/16/2018 0318   GFRNONAA 78 08/25/2016 1100   GFRAA >60 05/16/2018 0318   GFRAA >89 08/25/2016 1100   Lipase     Component Value Date/Time   LIPASE 37 05/14/2018 2014    Studies/Results: Dg Chest Port 1 View  Result Date: 05/15/2018 CLINICAL DATA:  Ventilator support EXAM: PORTABLE CHEST 1 VIEW COMPARISON:  05/14/2018 FINDINGS: Endotracheal tube tip is 2 cm above the carina. Nasogastric tube enters the stomach. Right internal jugular central line tip is in the proximal right atrium. No pneumothorax. Mild bilateral lower lobe atelectasis. IMPRESSION: Lines and tubes well positioned. Mild basilar atelectasis. Electronically Signed   By: Nelson Chimes M.D.   On: 05/15/2018 07:41   Dg Chest Portable 1 View  Result Date: 05/14/2018 CLINICAL DATA:  Abdominal pain for several days EXAM: PORTABLE CHEST 1 VIEW COMPARISON:  CT from earlier in the same day. FINDINGS: Elevation of the right hemidiaphragm is again seen. Cardiac shadow is within normal limits. Mild right basilar atelectatic changes are noted. No sizable effusion is seen. No bony abnormality is noted. IMPRESSION: Mild right basilar atelectasis. Electronically Signed   By: Inez Catalina M.D.   On: 05/14/2018 15:26   Ct Renal Stone Study  Result Date: 05/14/2018 CLINICAL DATA:  58 y/o F; nausea, vomiting, and abdominal pain for 3 days. EXAM: CT ABDOMEN AND PELVIS WITHOUT CONTRAST TECHNIQUE: Multidetector CT imaging of the abdomen and pelvis was performed following the standard protocol without IV contrast. COMPARISON:  None. FINDINGS: Lower chest: Platelike atelectasis in the lung bases. Hepatobiliary: No focal liver abnormality  is seen. No gallstones, gallbladder wall thickening, or biliary dilatation. Pancreas: Unremarkable. No pancreatic ductal dilatation or surrounding inflammatory changes. Spleen: Normal in size without focal abnormality. Adrenals/Urinary Tract: Adrenal glands are unremarkable. Kidneys are normal, without renal calculi, focal lesion, or hydronephrosis. Bladder is unremarkable. Stomach/Bowel: Severe diffuse small-bowel obstruction with transition at the level of the distal ileum in the right lower quadrant (series 3, image 65). Mild sigmoid diverticulosis without findings of acute diverticulitis. Otherwise normal appearance of the colon. Vascular/Lymphatic: Aortic atherosclerosis. No enlarged abdominal or pelvic lymph nodes. Reproductive: Negative. Other: There are a few tiny foci of pneumoperitoneum at the  liver hilum, anterior abdominal wall, and below the diaphragm (series 3, image 12, 24, 27). Small volume of ascites. Musculoskeletal: No fracture is seen. Lumbar spondylosis with prominent facet arthropathy. IMPRESSION: 1. Severe diffuse small bowel obstruction with transition level of distal ileum in right lower quadrant. 2. Few punctate foci of pneumoperitoneum indicating small/micro perforation. Small volume of ascites. These results were called by telephone at the time of interpretation on 05/14/2018 at 3:27 pm to Dr. Andy Gauss, who verbally acknowledged these results. Electronically Signed   By: Kristine Garbe M.D.   On: 05/14/2018 15:34      Kalman Drape , [facility] Surgery 05/16/2018, 9:51 AM  Pager: [phone] Mon-Wed, Friday 7:00am-4:30pm Thurs 7am-11:30am  Consults: [phone]

## 2018-05-17 ENCOUNTER — Encounter (HOSPITAL_COMMUNITY): Payer: Self-pay | Admitting: Surgery

## 2018-05-17 ENCOUNTER — Inpatient Hospital Stay (HOSPITAL_COMMUNITY): Payer: 59

## 2018-05-17 LAB — BASIC METABOLIC PANEL
Anion gap: 8 (ref 5–15)
BUN: 15 mg/dL (ref 6–20)
CO2: 21 mmol/L — ABNORMAL LOW (ref 22–32)
Calcium: 7.9 mg/dL — ABNORMAL LOW (ref 8.9–10.3)
Chloride: 114 mmol/L — ABNORMAL HIGH (ref 98–111)
Creatinine, Ser: 1 mg/dL (ref 0.44–1.00)
GFR calc Af Amer: >60 mL/min (ref >60)
GFR calc non Af Amer: >60 mL/min (ref >60)
Glucose, Bld: 139 mg/dL — ABNORMAL HIGH (ref 70–99)
POTASSIUM: 3.5 mmol/L (ref 3.5–5.1)
Sodium: 143 mmol/L (ref 135–145)

## 2018-05-17 LAB — CBC
HCT: 31.8 % — ABNORMAL LOW (ref 36.0–46.0)
Hemoglobin: 10 g/dL — ABNORMAL LOW (ref 12.0–15.0)
MCH: 25.9 pg — ABNORMAL LOW (ref 26.0–34.0)
MCHC: 31.4 g/dL (ref 30.0–36.0)
MCV: 82.4 fL (ref 80.0–100.0)
NRBC: 0 % (ref 0.0–0.2)
Platelets: 258 10*3/uL (ref 150–400)
RBC: 3.86 MIL/uL — ABNORMAL LOW (ref 3.87–5.11)
RDW: 15.5 % (ref 11.5–15.5)
WBC: 18 10*3/uL — ABNORMAL HIGH (ref 4.0–10.5)

## 2018-05-17 LAB — GLUCOSE, CAPILLARY
Glucose-Capillary: 121 mg/dL — ABNORMAL HIGH (ref 70–99)
Glucose-Capillary: 131 mg/dL — ABNORMAL HIGH (ref 70–99)
Glucose-Capillary: 152 mg/dL — ABNORMAL HIGH (ref 70–99)
Glucose-Capillary: 146 mg/dL — ABNORMAL HIGH (ref 70–99)
Glucose-Capillary: 144 mg/dL — ABNORMAL HIGH (ref 70–99)

## 2018-05-17 MED ORDER — ACETAMINOPHEN 10 MG/ML IV SOLN
1000.0000 mg | Freq: Four times a day (QID) | INTRAVENOUS | Status: AC
  Administered 2018-05-17 – 2018-05-18 (×4): 1000 mg via INTRAVENOUS
  Filled 2018-05-17 (×6): qty 100

## 2018-05-17 MED ORDER — FUROSEMIDE 10 MG/ML IJ SOLN
20.0000 mg | Freq: Once | INTRAMUSCULAR | Status: AC
  Administered 2018-05-17: 20 mg via INTRAVENOUS
  Filled 2018-05-17: qty 2

## 2018-05-17 MED ORDER — ENOXAPARIN SODIUM 40 MG/0.4ML ~~LOC~~ SOLN
40.0000 mg | SUBCUTANEOUS | Status: DC
  Administered 2018-05-17 – 2018-06-03 (×18): 40 mg via SUBCUTANEOUS
  Filled 2018-05-17 (×18): qty 0.4

## 2018-05-17 MED ORDER — INSULIN ASPART 100 UNIT/ML ~~LOC~~ SOLN
0.0000 [IU] | SUBCUTANEOUS | Status: DC
  Administered 2018-05-17 – 2018-05-19 (×5): 2 [IU] via SUBCUTANEOUS
  Administered 2018-05-19: 3 [IU] via SUBCUTANEOUS
  Administered 2018-05-19 (×2): 2 [IU] via SUBCUTANEOUS
  Administered 2018-05-20: 3 [IU] via SUBCUTANEOUS
  Administered 2018-05-20: 8 [IU] via SUBCUTANEOUS
  Administered 2018-05-20: 3 [IU] via SUBCUTANEOUS

## 2018-05-17 NOTE — Progress Notes (Signed)
Pharmacy Antibiotic Note  Wendy [name] is a 58 y.o. female admitted on 05/14/2018 with sepsis from small bowel perforation.  S/p ex lap, small bowel resection, closure with abthera vac as well as s/p exporatory laparotomy with small bowel anastomosis and closure of abdominal fascia. Patient has been on cefepime and flagyl since 1/27 and continuing antibiotics per surgery's request. Patient's serum creatinine has improved from 1.55 to baseline at 1.00. Patient's afebrile for 24 hours and WBC remains elevated at 18 today.   Plan: Continue cefepime 2g IV every 8 hours Continue metronidazole 500mg  IV every 8 hours Monitor signs / symptoms of infection, wbc, scr, and if returning to OR for LOT.    Height: 5\' 3"  (160 cm) Weight: 262 lb 5.6 oz (119 kg) IBW/kg (Calculated) : 52.4  Temp (24hrs), Avg:100.3 F (37.9 C), Min:99.5 F (37.5 C), Max:102.1 F (38.9 C)  Recent Labs  Lab 05/14/18 1133 05/14/18 1648 05/14/18 2014 05/14/18 2047 05/14/18 2223  05/15/18 0328 05/15/18 0628 05/15/18 1037 05/16/18 0318 05/17/18 0314  WBC 22.5*  --  14.8*  --   --   --  18.9*  --   --  16.9* 18.0*  CREATININE 2.18*  --  1.59*  --  1.55*   < > 1.39* 1.30* 1.24* 1.08* 1.00  LATICACIDVEN  --  5.2*  --  1.2 1.3  --   --   --   --   --   --    < > = values in this interval not displayed.    Estimated Creatinine Clearance: 77.4 mL/min (by C-G formula based on SCr of 1 mg/dL).    Allergies  Allergen Reactions  . Penicillins Swelling    Did it involve swelling of the face/tongue/throat, SOB, or low BP? Yes Did it involve sudden or severe rash/hives, skin peeling, or any reaction on the inside of your mouth or nose? No Did you need to seek medical attention at a hospital or doctor's office? Yes When did it last happen? unknown If all above answers are "NO", may proceed with cephalosporin use.     Antimicrobials this admission: vanc 1/27 >> 1/28 Cefepime 1/27 >> Flagyl 1/27 >> Anidulafungin 1/27 >>  1/28   Thank you for allowing pharmacy to be a part of this patient's care.  Tamela Gammon, PharmD 05/17/2018 2:23 PM PGY-1 Pharmacy Resident Direct Phone: [phone] Please check [email or web address] for unit-specific pharmacist phone numbers

## 2018-05-17 NOTE — Progress Notes (Signed)
[facility]/Trauma Progress Note  1 Day Post-Op   Assessment/Plan DKA- per medicine, improving  Sepsis- per primary SBO,Pneumoperitoneum, intussusception with necrosis & perforation - S/P ex lap, small bowel resection, closure with abthera vac, Dr. Grandville Silos, 01/17 - S/P Exportory laparotomy with small bowel anastomosis and closure of abdominal fascia, Dr. Brantley Stage, 01/29 - cont NGT to LIWS 2/2 high bilious NGT output - will consider trickle TF's or TNA tomorrow depending on bowel function  FEN:NPO, NGT to LIWS VTE: SCD's, start lovenox 01/30 EP:PIRJJO 01/27-01/28, Vanc once 01/27, Cefepime &Flagyl 01/27>>febrile, blood cultures 01/29 pending Foley:yes Follow up:TBD  DISPO:continue IV abx, continue NGT to LIWS   LOS: 3 days    Subjective: CC: S/P ex lap x 2  Pt is awake and alert on vent but somnolent. No family at bedside.   Objective: Vital signs in last 24 hours: Temp:  [99.1 F (37.3 C)-102.1 F (38.9 C)] 102.1 F (38.9 C) (01/30 0345) Pulse Rate:  [102-129] 119 (01/30 0745) Resp:  [13-18] 16 (01/30 0745) BP: (100-148)/(64-108) 119/73 (01/30 0745) SpO2:  [99 %-100 %] 100 % (01/30 0745) FiO2 (%):  [40 %] 40 % (01/30 0745) Weight:  [841 kg] 119 kg (01/30 0420) Last BM Date: (PTA)  Intake/Output from previous day: 01/29 0701 - 01/30 0700 In: 1725.6 [I.V.:923.7; IV Piggyback:801.9] Out: 2590 [Urine:890; Emesis/NG output:650; Drains:25; Blood:25] Intake/Output this shift: No intake/output data recorded.  PE: Gen: Alert, NAD, on vent, follows some commands Card:tachycardic Pulm:On vent Abd: Soft,ND, vac in place, generalized TTP with guarding Skin: no rashes noted, warm and dry   Anti-infectives: Anti-infectives (From admission, onward)   Start     Dose/Rate Route Frequency Ordered Stop   05/15/18 2333  ceFEPIme (MAXIPIME) 2 g in sodium chloride 0.9 % 100 mL IVPB     2 g 200 mL/hr over 30 Minutes Intravenous Every 8  hours 05/15/18 2226     05/15/18 1500  ceFEPIme (MAXIPIME) 1 g in sodium chloride 0.9 % 100 mL IVPB  Status:  Discontinued     1 g 200 mL/hr over 30 Minutes Intravenous Every 24 hours 05/14/18 1512 05/15/18 2226   05/15/18 0000  anidulafungin (ERAXIS) 100 mg in sodium chloride 0.9 % 100 mL IVPB  Status:  Discontinued     100 mg 78 mL/hr over 100 Minutes Intravenous Every 24 hours 05/14/18 1728 05/15/18 1159   05/14/18 1800  anidulafungin (ERAXIS) 200 mg in sodium chloride 0.9 % 200 mL IVPB     200 mg 78 mL/hr over 200 Minutes Intravenous  Once 05/14/18 1751 05/14/18 2138   05/14/18 1745  vancomycin (VANCOCIN) 2,000 mg in sodium chloride 0.9 % 500 mL IVPB  Status:  Discontinued     2,000 mg 250 mL/hr over 120 Minutes Intravenous  Once 05/14/18 1741 05/15/18 1159   05/14/18 1741  vancomycin variable dose per unstable renal function (pharmacist dosing)  Status:  Discontinued      Does not apply See admin instructions 05/14/18 1741 05/15/18 1159   05/14/18 1515  ceFEPIme (MAXIPIME) 2 g in sodium chloride 0.9 % 100 mL IVPB     2 g 200 mL/hr over 30 Minutes Intravenous  Once 05/14/18 1512 05/14/18 1649   05/14/18 1515  metroNIDAZOLE (FLAGYL) IVPB 500 mg     500 mg 100 mL/hr over 60 Minutes Intravenous Every 8 hours 05/14/18 1512        Lab Results:  Recent Labs    05/16/18 0318 05/17/18 0314  WBC 16.9* 18.0*  HGB  9.6* 10.0*  HCT 30.6* 31.8*  PLT 209 258   BMET Recent Labs    05/16/18 0318 05/17/18 0314  NA 141 143  K 3.6 3.5  CL 113* 114*  CO2 21* 21*  GLUCOSE 147* 139*  BUN 16 15  CREATININE 1.08* 1.00  CALCIUM 8.5* 7.9*   PT/INR Recent Labs    05/14/18 2014  LABPROT 17.4*  INR 1.44   CMP     Component Value Date/Time   NA 143 05/17/2018 0314   NA 144 12/25/2014 1536   K 3.5 05/17/2018 0314   CL 114 (H) 05/17/2018 0314   CO2 21 (L) 05/17/2018 0314   GLUCOSE 139 (H) 05/17/2018 0314   BUN 15 05/17/2018 0314   BUN 9 12/25/2014 1536   CREATININE 1.00  05/17/2018 0314   CREATININE 0.69 05/25/2017 1150   CALCIUM 7.9 (L) 05/17/2018 0314   PROT 5.1 (L) 05/14/2018 2014   PROT 6.7 12/25/2014 1536   ALBUMIN 2.7 (L) 05/14/2018 2014   ALBUMIN 4.4 12/25/2014 1536   AST 49 (H) 05/14/2018 2014   ALT 29 05/14/2018 2014   ALKPHOS 78 05/14/2018 2014   BILITOT 1.4 (H) 05/14/2018 2014   BILITOT 0.3 12/25/2014 1536   GFRNONAA >60 05/17/2018 0314   GFRNONAA 78 08/25/2016 1100   GFRAA >60 05/17/2018 0314   GFRAA >89 08/25/2016 1100   Lipase     Component Value Date/Time   LIPASE 37 05/14/2018 2014    Studies/Results: Dg Chest Port 1 View  Result Date: 05/17/2018 CLINICAL DATA:  Endotracheally intubated. EXAM: PORTABLE CHEST 1 VIEW COMPARISON:  05/15/2018 FINDINGS: Endotracheal tube terminates 2.5 cm above the carina. Enteric tube courses into the abdomen with tip not imaged and side hole below the diaphragm. Right jugular catheter terminates near the superior cavoatrial junction. The cardiomediastinal silhouette is unchanged with normal heart size. Mild bibasilar opacities have slightly improved. No sizable pleural effusion or pneumothorax is identified. IMPRESSION: Decreased, mild bibasilar atelectasis. Electronically Signed   By: Logan Bores M.D.   On: 05/17/2018 07:14      Kalman Drape , [facility] 05/17/2018, 8:11 AM  Pager: [phone] Mon-Wed, Friday 7:00am-4:30pm Thurs 7am-11:30am  Consults: [phone]

## 2018-05-17 NOTE — Progress Notes (Signed)
NAME:  Wendy [name], MRN:  [id number], DOB:  1960/08/24, LOS: 3 ADMISSION DATE:  05/14/2018, CONSULTATION DATE: 05/14/2018 REFERRING MD:  Dr. Wilson Singer, CHIEF COMPLAINT:  Abd pain  Brief History   58 year old female who presented with 3-day history of abdominal pain, emesis, anorexia, and polyuria for the past 3 days.  Labs notable hyperglycemia 771 with anion gap of 21.  CT abdomen pelvis showed small bowel obstruction with pneumoperitoneum and she underwent ex lap with small bowel resection.  History of present illness   This is a 58 year old female with a history of morbid obesity diabetes, GERD and hyperlipidemia who came in yesterday with a 3-day history of vomiting and abdominal pain also noted to have polyuria and decreased appetite for the past 3 days.  She had not been taking any of her home diabetes medications as prescribed.  In the ER she was found to be febrile, tachycardic and normotensive.  Labs are notable for a glucose of some 71, anion gap of 21, corrected sodium 141 potassium, chloride 89, BUN 40, creatinine 2.18 (baseline around 0.69).  CT abdomen pelvis showed diffuse small bowel obstruction with pneumoperitoneum.  She was given 3 L normal saline and started on insulin drip.  Given cefepime and Flagyl.  While in the ER patient she had worsening of her mental status requiring intubation. Surgery was consulted and took her for a ex lap for they performed a small bowel resection.  Patient was admitted to the ICU due to her critical condition.  Past Medical History   Past Medical History:  Diagnosis Date  . Acute eczema   . Allergy   . Anemia   . Anxiety   . Chronic constipation with overflow   . Diabetes mellitus without complication ([place])   . GERD (gastroesophageal reflux disease)   . Herpetic vesicle in vagina   . Hyperlipidemia   . Hypertension   . Leg edema   . Morbid obesity ([place])   . Peri-menopause   . Snoring      Significant Hospital Events   1/27 > Admitted to  hospital > OR > ICU  Consults:  Surgery  Procedures:  1/27 > Ex lap with small bowel resection  Significant Diagnostic Tests:  1/27 CT abd/pelvis > SBO with pneumoperitoneum  Micro Data:  1/27 blood culture> NGTD 1/27 urine culture> No growth 1/27 respiratory culture>  1/27 peritoneal culture> NGTD 1/27 MRSA> negative 1/29 Blood cultures > NGTD  Antimicrobials:  Metronidazole 1/27> Cefepime 1/27> Anidulafungin 1/27 > 1/28  Interim history/subjective:  No acute events overnight.  Patient is status post exploratory laparotomy with small bowel anastomosis and closure of abdominal fascia yesterday.  Appeared to have tolerated the procedure well.  This morning patient is awake however not following commands.  Discussed plan with husband at bedside.  Objective   Blood pressure 100/67, pulse (!) 116, temperature (!) 102.1 F (38.9 C), resp. rate 16, height 5' 3" (1.6 m), weight 119 kg, last menstrual period 01/26/2017, SpO2 99 %.    Vent Mode: PRVC FiO2 (%):  [40 %] 40 % Set Rate:  [16 bmp] 16 bmp Vt Set:  [450 mL] 450 mL PEEP:  [5 cmH20] 5 cmH20 Plateau Pressure:  [17 YSH68-37 cmH20] 17 cmH20   Intake/Output Summary (Last 24 hours) at 05/17/2018 0659 Last data filed at 05/17/2018 0600 Gross per 24 hour  Intake 1715.79 ml  Output 2590 ml  Net -874.21 ml   Filed Weights   05/15/18 0335 05/16/18 0412 05/17/18 0420  Weight:  117.9 kg 118.5 kg 119 kg    Examination: General: Intubated, sedated, comfortable appearing, not following commands HENT: Atraumatic, normocephalic, ET tube in place, right IJ in place Lungs: Bibasilar crackles, no wheezing, rhonchi or rales Cardiovascular: Tachycardic, no murmurs rubs or gallops Abdomen: Soft, open wound midline with wound VAC in place, decreased bowel sounds, no guarding Extremities: 1+ bilateral lower extremity edema, SCDs in place Neuro: Sedated, awake, not following commands GU: Foley in place  Resolved Hospital Problem list       Assessment & Plan:   Small bowel obstruction, ischemic bowel S/p ex lap and small bowel resection Intubated for airway protection Sepsis Hypotension -Patient returned to the OR 1/29 and had small bowel anastomosis and closure of her abdominal fascia.  Appeared to have tolerated the procedure well.  Patient has been febrile to 102.1 as well has a leukocytosis of 18, it may be related to her surgery however we will continue to follow-up blood cultures and continue antibiotics.  On exam she does appear to have some bibasilar crackles.  She is also not following commands events and we will decrease her sedation to hopefully improve her mental status. -Surgery recommends keeping an NG tube to LIWS due to high bilious output -Patient may be a candidate for extubation however will need to discuss with surgery if there is any plan to return to the OR. -Daily SBT and sedation vacation -Continue fentanyl for sedation, RASS 0 to -1 -Pulmonary hygiene -Continue cefepime and flagyl -MRSA negative -Holding tube feeds, can restart diet per surgery -Lasix 20 mg once, monitor urinary output -Possible extubation later today  DKA: History of DM: -Resolved, we will continue current insulin regimen. -Continue Lantus 15 -Frequent CBGs, every 4 hours -SSI  AKI: Cr at 1 today, down from 1.5 on admission.  This was likely secondary to a prerenal azotemia from her abscess and hypotension on admission. -Strict I+Os, daily weights  Best practice:  Diet: NPO Pain/Anxiety/Delirium protocol (if indicated): Fentanyl for RASS -1 to 0 VAP protocol (if indicated): yes DVT prophylaxis: SCDs GI prophylaxis: PPI Glucose control: Lantus and SSI Mobility: Bedrest Code Status: Full Family Communication: Spoke with husband at bedside Disposition: Remain in ICU  Labs   CBC: Recent Labs  Lab 05/14/18 1133  05/14/18 1840 05/14/18 2014 05/15/18 0328 05/16/18 0318 05/17/18 0314  WBC 22.5*  --   --  14.8*  18.9* 16.9* 18.0*  NEUTROABS 19.1*  --   --  12.0*  --   --   --   HGB 13.2   < > 10.5* 11.4* 10.8* 9.6* 10.0*  HCT 41.7   < > 31.0* 35.5* 35.0* 30.6* 31.8*  MCV 81.9  --   --  82.8 83.9 83.4 82.4  PLT 370  --   --  256 275 209 258   < > = values in this interval not displayed.    Basic Metabolic Panel: Recent Labs  Lab 05/14/18 2014  05/15/18 0328 05/15/18 0628 05/15/18 1037 05/16/18 0318 05/17/18 0314  NA 137   < > 140 139 140 141 143  K 3.4*   < > 4.1 3.7 3.6 3.6 3.5  CL 106   < > 113* 110 112* 113* 114*  CO2 19*   < > 21* 22 21* 21* 21*  GLUCOSE 339*   < > 139* 154* 171* 147* 139*  BUN 31*   < > 27* 26* 25* 16 15  CREATININE 1.59*   < > 1.39* 1.30* 1.24* 1.08* 1.00  CALCIUM 8.9   < > 8.6* 7.9* 8.2* 8.5* 7.9*  MG 1.8  --  1.9  --   --   --   --   PHOS 2.5  --  2.3*  --   --   --   --    < > = values in this interval not displayed.   GFR: Estimated Creatinine Clearance: 77.4 mL/min (by C-G formula based on SCr of 1 mg/dL). Recent Labs  Lab 05/14/18 1648 05/14/18 2014 05/14/18 2047 05/14/18 2223 05/15/18 0328 05/16/18 0318 05/17/18 0314  PROCALCITON  --  25.12  --   --   --   --   --   WBC  --  14.8*  --   --  18.9* 16.9* 18.0*  LATICACIDVEN 5.2*  --  1.2 1.3  --   --   --     Liver Function Tests: Recent Labs  Lab 05/14/18 2014  AST 49*  ALT 29  ALKPHOS 78  BILITOT 1.4*  PROT 5.1*  ALBUMIN 2.7*   Recent Labs  Lab 05/14/18 2014  LIPASE 37  AMYLASE 60   No results for input(s): AMMONIA in the last 168 hours.  ABG    Component Value Date/Time   PHART 7.322 (L) 05/15/2018 0420   PCO2ART 44.2 05/15/2018 0420   PO2ART 103 05/15/2018 0420   HCO3 22.2 05/15/2018 0420   TCO2 20 (L) 05/14/2018 1840   ACIDBASEDEF 2.9 (H) 05/15/2018 0420   O2SAT 97.1 05/15/2018 0420     Coagulation Profile: Recent Labs  Lab 05/14/18 2014  INR 1.44    Cardiac Enzymes: No results for input(s): CKTOTAL, CKMB, CKMBINDEX, TROPONINI in the last 168  hours.  HbA1C: HbA1c, POC (controlled diabetic range)  Date/Time Value Ref Range Status  02/06/2018 05:16 PM 8.2 (A) 0.0 - 7.0 % Final  11/06/2017 12:15 PM 9.7 (A) 0.0 - 7.0 % Final    CBG: Recent Labs  Lab 05/16/18 1234 05/16/18 1624 05/16/18 1927 05/16/18 2339 05/17/18 0320  GLUCAP 96 145* 136* 107* 121*    Review of Systems:   Unable to obtain due to patients mental status  Past Medical History  She,  has a past medical history of Acute eczema, Allergy, Anemia, Anxiety, Chronic constipation with overflow, Diabetes mellitus without complication ([place]), GERD (gastroesophageal reflux disease), Herpetic vesicle in vagina, Hyperlipidemia, Hypertension, Leg edema, Morbid obesity ([place]), Peri-menopause, and Snoring.   Surgical History    Past Surgical History:  Procedure Laterality Date  . CESAREAN SECTION  1993  . LAPAROTOMY N/A 05/14/2018   Procedure: EXPLORATORY LAPAROTOMY;  Surgeon: Georganna Skeans, MD;  Location: [facility];  Service: General;  Laterality: N/A;  . tonsillectomy and adnoidectomy    . WISDOM TOOTH EXTRACTION       Social History   reports that she has been smoking cigarettes. She started smoking about 47 years ago. She has smoked for the past 20.00 years. She has never used smokeless tobacco. She reports that she does not drink alcohol or use drugs.   Family History   Her family history includes Asthma in her mother and son; Autism in her son; Diabetes in her mother; Heart disease in her father and mother; Kidney disease in her paternal grandmother; Other in her father; Prostate cancer in her father. There is no history of Breast cancer, Colon cancer, Esophageal cancer, Pancreatic cancer, Rectal cancer, or Stomach cancer.   Allergies Allergies  Allergen Reactions  . Penicillins Swelling    Did it involve swelling  of the face/tongue/throat, SOB, or low BP? Yes Did it involve sudden or severe rash/hives, skin peeling, or any reaction on the inside of your mouth  or nose? No Did you need to seek medical attention at a hospital or doctor's office? Yes When did it last happen? unknown If all above answers are "NO", may proceed with cephalosporin use.      Home Medications  Prior to Admission medications   Medication Sig Start Date End Date Taking? Authorizing Provider  aspirin (ASPIRIN LOW DOSE) 81 MG chewable tablet Chew 1 tablet by mouth daily. 04/10/14  Yes [provider]  atorvastatin (LIPITOR) 40 MG tablet Take 1 tablet (40 mg total) by mouth every evening. 02/06/18  Yes Sowles, Drue Stager, MD  Cholecalciferol (VITAMIN D) 2000 units CAPS Take 1 capsule (2,000 Units total) by mouth daily. 03/25/16  Yes Sowles, Drue Stager, MD  Empagliflozin-metFORMIN HCl ER (SYNJARDY XR) 25-1000 MG TB24 Take 1 tablet by mouth daily. 02/06/18  Yes Sowles, Drue Stager, MD  fluticasone (FLONASE) 50 MCG/ACT nasal spray Place 2 sprays into both nostrils daily. Patient taking differently: Place 2 sprays into both nostrils daily as needed for allergies.  03/27/18  Yes Sowles, Drue Stager, MD  insulin degludec (TRESIBA) 100 UNIT/ML SOPN FlexTouch Pen INJECT 20 TO 50 UNITS INTO THE SKIN DAILY Patient taking differently: Inject 20-50 Units into the skin daily.  05/06/18  Yes Sowles, Drue Stager, MD  telmisartan-hydrochlorothiazide (MICARDIS HCT) 80-25 MG tablet TAKE 1 TABLET BY MOUTH DAILY. *INS ONLY PAYS FOR 30 DAYS* Patient taking differently: Take 1 tablet by mouth daily.  02/06/18  Yes Sowles, Drue Stager, MD  valACYclovir (VALTREX) 500 MG tablet Take 1 tablet (500 mg total) by mouth 3 (three) times daily as needed. And daily for prevention Patient taking differently: Take 500 mg by mouth 3 (three) times daily as needed (for prevention).  01/15/18  Yes Sowles, Drue Stager, MD  venlafaxine XR (EFFEXOR-XR) 150 MG 24 hr capsule Take 1 capsule (150 mg total) by mouth daily. 02/06/18  Yes Sowles, Drue Stager, MD  B-12, Methylcobalamin, 1000 MCG SUBL Place 1 tablet under the tongue daily. Patient not  taking: Reported on 05/14/2018 03/25/16   Steele Sizer, MD  Blood Glucose Monitoring Suppl (ONE TOUCH ULTRA 2) w/Device KIT See admin instructions. 06/08/16   [provider]  Insulin Pen Needle (NOVOFINE) 32G X 6 MM MISC 1 each by Does not apply route daily. 11/06/17   Steele Sizer, MD  ONE TOUCH ULTRA TEST test strip CHECK FASTING BLOOD SUGARS TWICE DAILY 03/29/16   Steele Sizer, MD  [facility] DELICA LANCETS FINE MISC See admin instructions. 06/08/16   [provider]          Asencion Noble, M.D. PGY1 Pager [phone] 05/17/2018 6:59 AM

## 2018-05-17 NOTE — Consult Note (Addendum)
WOC consulted for Vac dressing; this was applied in the OR yesterday.  Intact with good seal with 166mm cont suction to midline abd wound.  WOC will plan to perform the first post-op dressing change tomorrow with surgical PA to assess the wound. Julien Girt MSN, RN, [place], [place], [place]

## 2018-05-18 ENCOUNTER — Inpatient Hospital Stay (HOSPITAL_COMMUNITY): Payer: 59

## 2018-05-18 DIAGNOSIS — K659 Peritonitis, unspecified: Secondary | ICD-10-CM

## 2018-05-18 LAB — CBC
HCT: 25.4 % — ABNORMAL LOW (ref 36.0–46.0)
Hemoglobin: 8.4 g/dL — ABNORMAL LOW (ref 12.0–15.0)
MCH: 26.4 pg (ref 26.0–34.0)
MCHC: 33.1 g/dL (ref 30.0–36.0)
MCV: 79.9 fL — ABNORMAL LOW (ref 80.0–100.0)
PLATELETS: 293 10*3/uL (ref 150–400)
RBC: 3.18 MIL/uL — ABNORMAL LOW (ref 3.87–5.11)
RDW: 15.8 % — ABNORMAL HIGH (ref 11.5–15.5)
WBC: 16.6 10*3/uL — AB (ref 4.0–10.5)
nRBC: 0 % (ref 0.0–0.2)

## 2018-05-18 LAB — GLUCOSE, CAPILLARY
Glucose-Capillary: 110 mg/dL — ABNORMAL HIGH (ref 70–99)
Glucose-Capillary: 119 mg/dL — ABNORMAL HIGH (ref 70–99)
Glucose-Capillary: 129 mg/dL — ABNORMAL HIGH (ref 70–99)
Glucose-Capillary: 135 mg/dL — ABNORMAL HIGH (ref 70–99)
Glucose-Capillary: 143 mg/dL — ABNORMAL HIGH (ref 70–99)
Glucose-Capillary: 99 mg/dL (ref 70–99)

## 2018-05-18 LAB — BASIC METABOLIC PANEL
Anion gap: 8 (ref 5–15)
BUN: 20 mg/dL (ref 6–20)
CO2: 22 mmol/L (ref 22–32)
Calcium: 8 mg/dL — ABNORMAL LOW (ref 8.9–10.3)
Chloride: 119 mmol/L — ABNORMAL HIGH (ref 98–111)
Creatinine, Ser: 1.12 mg/dL — ABNORMAL HIGH (ref 0.44–1.00)
GFR calc Af Amer: >60 mL/min (ref >60)
GFR calc non Af Amer: 54 mL/min — ABNORMAL LOW (ref >60)
Glucose, Bld: 144 mg/dL — ABNORMAL HIGH (ref 70–99)
Potassium: 3.4 mmol/L — ABNORMAL LOW (ref 3.5–5.1)
Sodium: 149 mmol/L — ABNORMAL HIGH (ref 135–145)

## 2018-05-18 LAB — POCT I-STAT 7, (LYTES, BLD GAS, ICA,H+H)
Acid-base deficit: 5 mmol/L — ABNORMAL HIGH (ref 0.0–2.0)
Bicarbonate: 21 mmol/L (ref 20.0–28.0)
Calcium, Ion: 1.24 mmol/L (ref 1.15–1.40)
HCT: 25 % — ABNORMAL LOW (ref 36.0–46.0)
Hemoglobin: 8.5 g/dL — ABNORMAL LOW (ref 12.0–15.0)
O2 Saturation: 96 %
Potassium: 3.4 mmol/L — ABNORMAL LOW (ref 3.5–5.1)
Sodium: 150 mmol/L — ABNORMAL HIGH (ref 135–145)
TCO2: 22 mmol/L (ref 22–32)
pCO2 arterial: 41.8 mmHg (ref 32.0–48.0)
pH, Arterial: 7.309 — ABNORMAL LOW (ref 7.350–7.450)
pO2, Arterial: 92 mmHg (ref 83.0–108.0)

## 2018-05-18 LAB — BLOOD CULTURE ID PANEL (REFLEXED)
ACINETOBACTER BAUMANNII: NOT DETECTED
CANDIDA TROPICALIS: NOT DETECTED
Candida albicans: NOT DETECTED
Candida glabrata: NOT DETECTED
Candida krusei: NOT DETECTED
Candida parapsilosis: NOT DETECTED
Enterobacter cloacae complex: NOT DETECTED
Enterobacteriaceae species: NOT DETECTED
Enterococcus species: NOT DETECTED
Escherichia coli: NOT DETECTED
Haemophilus influenzae: NOT DETECTED
KLEBSIELLA OXYTOCA: NOT DETECTED
KLEBSIELLA PNEUMONIAE: NOT DETECTED
Listeria monocytogenes: NOT DETECTED
Neisseria meningitidis: NOT DETECTED
Proteus species: NOT DETECTED
Pseudomonas aeruginosa: NOT DETECTED
Serratia marcescens: NOT DETECTED
Staphylococcus aureus (BCID): NOT DETECTED
Staphylococcus species: NOT DETECTED
Streptococcus agalactiae: NOT DETECTED
Streptococcus pneumoniae: NOT DETECTED
Streptococcus pyogenes: NOT DETECTED
Streptococcus species: NOT DETECTED

## 2018-05-18 MED ORDER — FENTANYL CITRATE (PF) 100 MCG/2ML IJ SOLN
50.0000 ug | INTRAMUSCULAR | Status: DC | PRN
  Administered 2018-05-18 – 2018-05-20 (×2): 50 ug via INTRAVENOUS
  Filled 2018-05-18 (×2): qty 2

## 2018-05-18 MED ORDER — VANCOMYCIN HCL 10 G IV SOLR
1250.0000 mg | INTRAVENOUS | Status: DC
  Filled 2018-05-18: qty 1250

## 2018-05-18 MED ORDER — POTASSIUM CHLORIDE 10 MEQ/100ML IV SOLN
10.0000 meq | INTRAVENOUS | Status: AC
  Administered 2018-05-18 (×2): 10 meq via INTRAVENOUS
  Filled 2018-05-18 (×2): qty 100

## 2018-05-18 MED ORDER — VANCOMYCIN HCL 10 G IV SOLR
2000.0000 mg | Freq: Once | INTRAVENOUS | Status: AC
  Administered 2018-05-18: 2000 mg via INTRAVENOUS
  Filled 2018-05-18: qty 2000

## 2018-05-18 MED ORDER — SODIUM CHLORIDE 0.9 % IV SOLN
2.0000 g | INTRAVENOUS | Status: DC
  Administered 2018-05-18: 2 g via INTRAVENOUS
  Filled 2018-05-18 (×2): qty 20

## 2018-05-18 NOTE — Progress Notes (Signed)
2 Days Post-Op   Subjective/Chief Complaint: On vent awake  Husband at bedside    Objective: Vital signs in last 24 hours: Temp:  [98.5 F (36.9 C)-99.7 F (37.6 C)] 98.5 F (36.9 C) (01/31 0812) Pulse Rate:  [92-127] 113 (01/31 0820) Resp:  [10-19] 10 (01/31 0820) BP: (122-149)/(70-122) 135/71 (01/31 0820) SpO2:  [98 %-100 %] 100 % (01/31 0820) FiO2 (%):  [40 %] 40 % (01/31 0820) Weight:  [118.1 kg] 118.1 kg (01/31 0430) Last BM Date: (pta)  Intake/Output from previous day: 01/30 0701 - 01/31 0700 In: 1133.3 [I.V.:228.4; IV Piggyback:904.9] Out: 1610 [Urine:1210; Emesis/NG output:400] Intake/Output this shift: No intake/output data recorded.  Incision/Wound:vac in place   Lab Results:  Recent Labs    05/16/18 0318 05/17/18 0314  WBC 16.9* 18.0*  HGB 9.6* 10.0*  HCT 30.6* 31.8*  PLT 209 258   BMET Recent Labs    05/17/18 0314 05/18/18 0421  NA 143 149*  K 3.5 3.4*  CL 114* 119*  CO2 21* 22  GLUCOSE 139* 144*  BUN 15 20  CREATININE 1.00 1.12*  CALCIUM 7.9* 8.0*   PT/INR No results for input(s): LABPROT, INR in the last 72 hours. ABG No results for input(s): PHART, HCO3 in the last 72 hours.  Invalid input(s): PCO2, PO2  Studies/Results: Dg Chest Port 1 View  Result Date: 05/17/2018 CLINICAL DATA:  Endotracheally intubated. EXAM: PORTABLE CHEST 1 VIEW COMPARISON:  05/15/2018 FINDINGS: Endotracheal tube terminates 2.5 cm above the carina. Enteric tube courses into the abdomen with tip not imaged and side hole below the diaphragm. Right jugular catheter terminates near the superior cavoatrial junction. The cardiomediastinal silhouette is unchanged with normal heart size. Mild bibasilar opacities have slightly improved. No sizable pleural effusion or pneumothorax is identified. IMPRESSION: Decreased, mild bibasilar atelectasis. Electronically Signed   By: Logan Bores M.D.   On: 05/17/2018 07:14    Anti-infectives: Anti-infectives (From admission,  onward)   Start     Dose/Rate Route Frequency Ordered Stop   05/15/18 2333  ceFEPIme (MAXIPIME) 2 g in sodium chloride 0.9 % 100 mL IVPB     2 g 200 mL/hr over 30 Minutes Intravenous Every 8 hours 05/15/18 2226     05/15/18 1500  ceFEPIme (MAXIPIME) 1 g in sodium chloride 0.9 % 100 mL IVPB  Status:  Discontinued     1 g 200 mL/hr over 30 Minutes Intravenous Every 24 hours 05/14/18 1512 05/15/18 2226   05/15/18 0000  anidulafungin (ERAXIS) 100 mg in sodium chloride 0.9 % 100 mL IVPB  Status:  Discontinued     100 mg 78 mL/hr over 100 Minutes Intravenous Every 24 hours 05/14/18 1728 05/15/18 1159   05/14/18 1800  anidulafungin (ERAXIS) 200 mg in sodium chloride 0.9 % 200 mL IVPB     200 mg 78 mL/hr over 200 Minutes Intravenous  Once 05/14/18 1751 05/14/18 2138   05/14/18 1745  vancomycin (VANCOCIN) 2,000 mg in sodium chloride 0.9 % 500 mL IVPB  Status:  Discontinued     2,000 mg 250 mL/hr over 120 Minutes Intravenous  Once 05/14/18 1741 05/15/18 1159   05/14/18 1741  vancomycin variable dose per unstable renal function (pharmacist dosing)  Status:  Discontinued      Does not apply See admin instructions 05/14/18 1741 05/15/18 1159   05/14/18 1515  ceFEPIme (MAXIPIME) 2 g in sodium chloride 0.9 % 100 mL IVPB     2 g 200 mL/hr over 30 Minutes Intravenous  Once 05/14/18 1512  05/14/18 1649   05/14/18 1515  metroNIDAZOLE (FLAGYL) IVPB 500 mg     500 mg 100 mL/hr over 60 Minutes Intravenous Every 8 hours 05/14/18 1512        Assessment/Plan: s/p Procedure(s): EXPLORATORY LAPAROTOMY with reanatomosis and fascia closure with wound vacum application (N/A) Hopefully extubate today Can D/C NGT given low output  Hold on clears until fully awake  Vac change   LOS: 4 days    Wendy [name] 05/18/2018

## 2018-05-18 NOTE — Progress Notes (Signed)
Pharmacy Antibiotic Note  Wendy [name] is a 58 y.o. female admitted on 05/14/2018 with intraabdominal infection.  Pharmacy has been consulted for vancomycin dosing.   WBC elevated; currently afebrile. Resolving AKI (Scr 1.12). Peritoneal cultures growing E. faecium and clostridium.   Plan: Loading dose vancomycin 200 mg IV x1  Maintenance dose vancomycin 1250 mg IV q24h  AUC 498.7 mcg*h/mL  Monitor clinical progress, c/s, abx plan/LOT  Height: 5\' 3"  (160 cm) Weight: 260 lb 5.8 oz (118.1 kg) IBW/kg (Calculated) : 52.4  Temp (24hrs), Avg:98.8 F (37.1 C), Min:98.5 F (36.9 C), Max:99.2 F (37.3 C)  Recent Labs  Lab 05/14/18 1648 05/14/18 2014 05/14/18 2047 05/14/18 2223  05/15/18 0328 05/15/18 0628 05/15/18 1037 05/16/18 0318 05/17/18 0314 05/18/18 0421 05/18/18 0759  WBC  --  14.8*  --   --   --  18.9*  --   --  16.9* 18.0*  --  16.6*  CREATININE  --  1.59*  --  1.55*   < > 1.39* 1.30* 1.24* 1.08* 1.00 1.12*  --   LATICACIDVEN 5.2*  --  1.2 1.3  --   --   --   --   --   --   --   --    < > = values in this interval not displayed.    Estimated Creatinine Clearance: 68.9 mL/min (A) (by C-G formula based on SCr of 1.12 mg/dL (H)).    Allergies  Allergen Reactions  . Penicillins Swelling    Did it involve swelling of the face/tongue/throat, SOB, or low BP? Yes Did it involve sudden or severe rash/hives, skin peeling, or any reaction on the inside of your mouth or nose? No Did you need to seek medical attention at a hospital or doctor's office? Yes When did it last happen? unknown If all above answers are "NO", may proceed with cephalosporin use.     Antimicrobials this admission: 1/31 vancomycin >>  1/31 ceftriaxone >>  1/27 flagyl >>  1/28 cefepime >> 1/31  Microbiology results: Peritoneal 1/27 >> E Faecium and clostridium species Bcx 1/29 >> GVR Ucx 1/27 >> negative MRSA Pcr >> negative  Thank you for allowing pharmacy to be a part of this patient's  care.  Carlyon Shadow 05/18/2018 1:44 PM

## 2018-05-18 NOTE — Consult Note (Signed)
[facility] Nurse wound consult note Reason for Consult:MIdline abdominal surgical wound with NPWT (VAC) dressing applied.  Wound type: Surgical Pressure Injury POA: NA Measurement: 18 cm x 4 cm x 4 cm  Wound bed: pale pink nongranulating Drainage (amount, consistency, odor) minimal serosanguinous  No odor Periwound:intact Dressing procedure/placement/frequency:  Cleanse midline wound with NS.  Fill with black foam and cover with drape.  Seal immediately achieved at 125.  CHange M/w/F Will not follow at this time.  Please re-consult if needed.  Domenic Moras MSN, RN, FNP-BC CWON Wound, Ostomy, Continence Nurse Pager [phone]

## 2018-05-18 NOTE — Progress Notes (Addendum)
NAME:  Wendy [name], MRN:  [id number], DOB:  12-04-1960, LOS: 4 ADMISSION DATE:  05/14/2018, CONSULTATION DATE: 05/14/2018 REFERRING MD:  Dr. Wilson Singer, CHIEF COMPLAINT:  Abd pain  Brief History   59 year old female who presented with 3-day history of abdominal pain, emesis, anorexia, and polyuria for the past 3 days.  Labs notable hyperglycemia 771 with anion gap of 21.  CT abdomen pelvis showed small bowel obstruction with pneumoperitoneum and she underwent ex lap with small bowel resection.  History of present illness   This is a 58 year old female with a history of morbid obesity diabetes, GERD and hyperlipidemia who came in yesterday with a 3-day history of vomiting and abdominal pain also noted to have polyuria and decreased appetite for the past 3 days.  She had not been taking any of her home diabetes medications as prescribed.  In the ER she was found to be febrile, tachycardic and normotensive.  Labs are notable for a glucose of some 71, anion gap of 21, corrected sodium 141 potassium, chloride 89, BUN 40, creatinine 2.18 (baseline around 0.69).  CT abdomen pelvis showed diffuse small bowel obstruction with pneumoperitoneum.  She was given 3 L normal saline and started on insulin drip.  Given cefepime and Flagyl.  While in the ER patient she had worsening of her mental status requiring intubation. Surgery was consulted and took her for a ex lap for they performed a small bowel resection.  Patient was admitted to the ICU due to her critical condition.  Past Medical History   Past Medical History:  Diagnosis Date  . Acute eczema   . Allergy   . Anemia   . Anxiety   . Chronic constipation with overflow   . Diabetes mellitus without complication ([place])   . GERD (gastroesophageal reflux disease)   . Herpetic vesicle in vagina   . Hyperlipidemia   . Hypertension   . Leg edema   . Morbid obesity ([place])   . Peri-menopause   . Snoring      Significant Hospital Events   1/27 > Admitted to  hospital > OR > ICU  Consults:  Surgery  Procedures:  1/27 > Ex lap with small bowel resection  Significant Diagnostic Tests:  1/27 CT abd/pelvis > SBO with pneumoperitoneum  Micro Data:  1/27 blood culture> NGTD 1/27 urine culture> No growth 1/27 respiratory culture>   1/27 peritoneal culture> NGTD 1/27 MRSA> negative 1/29 Blood cultures > NGTD  Antimicrobials:  Metronidazole 1/27>  Cefepime 1/27> Anidulafungin 1/27 > 1/28  Interim history/subjective:  No acute events overnight.  Today patient is intubated, she has not had any sedative medications since yesterday. Patient is only intermittently following commands. No family at bedside, spoke with husband yesterday.    Objective   Blood pressure 140/73, pulse 95, temperature 99.2 F (37.3 C), temperature source Axillary, resp. rate 16, height 5' 3"  (1.6 m), weight 118.1 kg, last menstrual period 01/26/2017, SpO2 100 %.    Vent Mode: PRVC FiO2 (%):  [40 %] 40 % Set Rate:  [16 bmp] 16 bmp Vt Set:  [450 mL] 450 mL PEEP:  [5 cmH20] 5 cmH20 Pressure Support:  [10 cmH20] 10 cmH20 Plateau Pressure:  [18 cmH20-19 cmH20] 18 cmH20   Intake/Output Summary (Last 24 hours) at 05/18/2018 0653 Last data filed at 05/18/2018 0600 Gross per 24 hour  Intake 1236.45 ml  Output 1610 ml  Net -373.55 ml   Filed Weights   05/16/18 0412 05/17/18 0420 05/18/18 0430  Weight: 118.5 kg  119 kg 118.1 kg    Examination: General:Intubated, only intermittently following commands HENT: AT, [place], IJ in place, NGT in place with bilious output Lungs: Lungs CTABL, no wheezing or rhonchi Cardiovascular: Tachy, no m/r/g Abdomen: Open wound in midline with wound vac in place, minimal serosanguinous output Extremities: No LE edema, SCDs in place Neuro: Awake, intermittently following commands GU: Foley in place  Resolved Hospital Problem list     Assessment & Plan:   Small bowel obstruction, ischemic bowel S/p ex lap and small bowel  resection Intubated for airway protection Sepsis Hypotension -Patient returned to the OR 1/29 and had small bowel anastomosis and closure of her abdominal fascia.  Appeared to have tolerated the procedure well. No plans to return to OR today. Wound vac in place with serosanguinous output. She is only intermittently following commands, fentanyl drip was stopped yesterday and she has not received any further sedative medications since then. We will obtain CT head to evaluate.  -Patient has been afebrile, leukocytosis has improved, blood cultures on 1/27 grew gram variable rods only in the anaerobic bottles. Will continue cefepime and flagyl, x7 days for now, may need to prolong this pending clinically improvement.  -Daily SBT and sedation vacation -Continue fentanyl for sedation, RASS 0 to -1 -Pulmonary hygiene -Continue cefepime and flagyl -MRSA negative -Holding tube feeds, diet per surgery -CT head -Possible extubation later today pending improvement in mental status   DKA: History of DM: -Resolved, we will continue current insulin regimen. -Continue Lantus 15 -Frequent CBGs, every 4 hours -SSI  AKI: Cr at 1 today, down from 1.5 on admission.  This was likely secondary to a prerenal azotemia from her abscess and hypotension on admission. -Strict I+Os, daily weights  Best practice:  Diet: NPO Pain/Anxiety/Delirium protocol (if indicated): Fentanyl PRN, hold for possible extubation VAP protocol (if indicated): yes DVT prophylaxis: SCDs GI prophylaxis: PPI Glucose control: Lantus and SSI Mobility: Bedrest Code Status: Full Family Communication:  Disposition: Remain in ICU  Labs   CBC: Recent Labs  Lab 05/14/18 1133  05/14/18 1840 05/14/18 2014 05/15/18 0328 05/16/18 0318 05/17/18 0314  WBC 22.5*  --   --  14.8* 18.9* 16.9* 18.0*  NEUTROABS 19.1*  --   --  12.0*  --   --   --   HGB 13.2   < > 10.5* 11.4* 10.8* 9.6* 10.0*  HCT 41.7   < > 31.0* 35.5* 35.0* 30.6* 31.8*   MCV 81.9  --   --  82.8 83.9 83.4 82.4  PLT 370  --   --  256 275 209 258   < > = values in this interval not displayed.    Basic Metabolic Panel: Recent Labs  Lab 05/14/18 2014  05/15/18 0328 05/15/18 0628 05/15/18 1037 05/16/18 0318 05/17/18 0314 05/18/18 0421  NA 137   < > 140 139 140 141 143 149*  K 3.4*   < > 4.1 3.7 3.6 3.6 3.5 3.4*  CL 106   < > 113* 110 112* 113* 114* 119*  CO2 19*   < > 21* 22 21* 21* 21* 22  GLUCOSE 339*   < > 139* 154* 171* 147* 139* 144*  BUN 31*   < > 27* 26* 25* 16 15 20   CREATININE 1.59*   < > 1.39* 1.30* 1.24* 1.08* 1.00 1.12*  CALCIUM 8.9   < > 8.6* 7.9* 8.2* 8.5* 7.9* 8.0*  MG 1.8  --  1.9  --   --   --   --   --  PHOS 2.5  --  2.3*  --   --   --   --   --    < > = values in this interval not displayed.   GFR: Estimated Creatinine Clearance: 68.9 mL/min (A) (by C-G formula based on SCr of 1.12 mg/dL (H)). Recent Labs  Lab 05/14/18 1648 05/14/18 2014 05/14/18 2047 05/14/18 2223 05/15/18 0328 05/16/18 0318 05/17/18 0314  PROCALCITON  --  25.12  --   --   --   --   --   WBC  --  14.8*  --   --  18.9* 16.9* 18.0*  LATICACIDVEN 5.2*  --  1.2 1.3  --   --   --     Liver Function Tests: Recent Labs  Lab 05/14/18 2014  AST 49*  ALT 29  ALKPHOS 78  BILITOT 1.4*  PROT 5.1*  ALBUMIN 2.7*   Recent Labs  Lab 05/14/18 2014  LIPASE 37  AMYLASE 60   No results for input(s): AMMONIA in the last 168 hours.  ABG    Component Value Date/Time   PHART 7.322 (L) 05/15/2018 0420   PCO2ART 44.2 05/15/2018 0420   PO2ART 103 05/15/2018 0420   HCO3 22.2 05/15/2018 0420   TCO2 20 (L) 05/14/2018 1840   ACIDBASEDEF 2.9 (H) 05/15/2018 0420   O2SAT 97.1 05/15/2018 0420     Coagulation Profile: Recent Labs  Lab 05/14/18 2014  INR 1.44    Cardiac Enzymes: No results for input(s): CKTOTAL, CKMB, CKMBINDEX, TROPONINI in the last 168 hours.  HbA1C: HbA1c, POC (controlled diabetic range)  Date/Time Value Ref Range Status   02/06/2018 05:16 PM 8.2 (A) 0.0 - 7.0 % Final  11/06/2017 12:15 PM 9.7 (A) 0.0 - 7.0 % Final    CBG: Recent Labs  Lab 05/17/18 1212 05/17/18 1551 05/17/18 2025 05/18/18 0034 05/18/18 0422  GLUCAP 152* 146* 144* 143* 129*    Review of Systems:   Unable to obtain due to patients mental status  Past Medical History  She,  has a past medical history of Acute eczema, Allergy, Anemia, Anxiety, Chronic constipation with overflow, Diabetes mellitus without complication ([place]), GERD (gastroesophageal reflux disease), Herpetic vesicle in vagina, Hyperlipidemia, Hypertension, Leg edema, Morbid obesity ([place]), Peri-menopause, and Snoring.   Surgical History    Past Surgical History:  Procedure Laterality Date  . CESAREAN SECTION  1993  . LAPAROTOMY N/A 05/14/2018   Procedure: EXPLORATORY LAPAROTOMY;  Surgeon: Georganna Skeans, MD;  Location: [facility];  Service: General;  Laterality: N/A;  . LAPAROTOMY N/A 05/16/2018   Procedure: EXPLORATORY LAPAROTOMY with reanatomosis and fascia closure with wound vacum application;  Surgeon: Erroll Luna, MD;  Location: [facility];  Service: General;  Laterality: N/A;  . tonsillectomy and adnoidectomy    . WISDOM TOOTH EXTRACTION       Social History   reports that she has been smoking cigarettes. She started smoking about 47 years ago. She has smoked for the past 20.00 years. She has never used smokeless tobacco. She reports that she does not drink alcohol or use drugs.   Family History   Her family history includes Asthma in her mother and son; Autism in her son; Diabetes in her mother; Heart disease in her father and mother; Kidney disease in her paternal grandmother; Other in her father; Prostate cancer in her father. There is no history of Breast cancer, Colon cancer, Esophageal cancer, Pancreatic cancer, Rectal cancer, or Stomach cancer.   Allergies Allergies  Allergen Reactions  . Penicillins  Swelling    Did it involve swelling of the  face/tongue/throat, SOB, or low BP? Yes Did it involve sudden or severe rash/hives, skin peeling, or any reaction on the inside of your mouth or nose? No Did you need to seek medical attention at a hospital or doctor's office? Yes When did it last happen? unknown If all above answers are "NO", may proceed with cephalosporin use.      Home Medications  Prior to Admission medications   Medication Sig Start Date End Date Taking? Authorizing Provider  aspirin (ASPIRIN LOW DOSE) 81 MG chewable tablet Chew 1 tablet by mouth daily. 04/10/14  Yes [provider]  atorvastatin (LIPITOR) 40 MG tablet Take 1 tablet (40 mg total) by mouth every evening. 02/06/18  Yes Sowles, Drue Stager, MD  Cholecalciferol (VITAMIN D) 2000 units CAPS Take 1 capsule (2,000 Units total) by mouth daily. 03/25/16  Yes Sowles, Drue Stager, MD  Empagliflozin-metFORMIN HCl ER (SYNJARDY XR) 25-1000 MG TB24 Take 1 tablet by mouth daily. 02/06/18  Yes Sowles, Drue Stager, MD  fluticasone (FLONASE) 50 MCG/ACT nasal spray Place 2 sprays into both nostrils daily. Patient taking differently: Place 2 sprays into both nostrils daily as needed for allergies.  03/27/18  Yes Sowles, Drue Stager, MD  insulin degludec (TRESIBA) 100 UNIT/ML SOPN FlexTouch Pen INJECT 20 TO 50 UNITS INTO THE SKIN DAILY Patient taking differently: Inject 20-50 Units into the skin daily.  05/06/18  Yes Sowles, Drue Stager, MD  telmisartan-hydrochlorothiazide (MICARDIS HCT) 80-25 MG tablet TAKE 1 TABLET BY MOUTH DAILY. *INS ONLY PAYS FOR 30 DAYS* Patient taking differently: Take 1 tablet by mouth daily.  02/06/18  Yes Sowles, Drue Stager, MD  valACYclovir (VALTREX) 500 MG tablet Take 1 tablet (500 mg total) by mouth 3 (three) times daily as needed. And daily for prevention Patient taking differently: Take 500 mg by mouth 3 (three) times daily as needed (for prevention).  01/15/18  Yes Sowles, Drue Stager, MD  venlafaxine XR (EFFEXOR-XR) 150 MG 24 hr capsule Take 1 capsule (150 mg  total) by mouth daily. 02/06/18  Yes Sowles, Drue Stager, MD  B-12, Methylcobalamin, 1000 MCG SUBL Place 1 tablet under the tongue daily. Patient not taking: Reported on 05/14/2018 03/25/16   Steele Sizer, MD  Blood Glucose Monitoring Suppl (ONE TOUCH ULTRA 2) w/Device KIT See admin instructions. 06/08/16   [provider]  Insulin Pen Needle (NOVOFINE) 32G X 6 MM MISC 1 each by Does not apply route daily. 11/06/17   Steele Sizer, MD  ONE TOUCH ULTRA TEST test strip CHECK FASTING BLOOD SUGARS TWICE DAILY 03/29/16   Steele Sizer, MD  [facility] DELICA LANCETS FINE MISC See admin instructions. 06/08/16   [provider]          Asencion Noble, M.D. PGY1 Pager [phone] 05/18/2018 6:53 AM

## 2018-05-18 NOTE — Progress Notes (Signed)
PHARMACY - PHYSICIAN COMMUNICATION CRITICAL VALUE ALERT - BLOOD CULTURE IDENTIFICATION (BCID)  Wendy [name] is an 58 y.o. female who presented to [facility] on 05/14/2018 with a chief complaint of sepsis s/p small bowel perforation/repair  Assessment:  1/2 blood cultures from 1/27 growing Gram variable rods in anaerobic bottle  Name of physician (or Provider) Contacted:  Dr. Sherry Ruffing  Current antibiotics:   Cefepime and Flagyl  Changes to prescribed antibiotics recommended:  Continue current antibiotics  Results for orders placed or performed during the hospital encounter of 05/14/18  Blood Culture ID Panel (Reflexed) (Collected: 05/14/2018  4:55 PM)  Result Value Ref Range   Enterococcus species NOT DETECTED NOT DETECTED   Listeria monocytogenes NOT DETECTED NOT DETECTED   Staphylococcus species NOT DETECTED NOT DETECTED   Staphylococcus aureus (BCID) NOT DETECTED NOT DETECTED   Streptococcus species NOT DETECTED NOT DETECTED   Streptococcus agalactiae NOT DETECTED NOT DETECTED   Streptococcus pneumoniae NOT DETECTED NOT DETECTED   Streptococcus pyogenes NOT DETECTED NOT DETECTED   Acinetobacter baumannii NOT DETECTED NOT DETECTED   Enterobacteriaceae species NOT DETECTED NOT DETECTED   Enterobacter cloacae complex NOT DETECTED NOT DETECTED   Escherichia coli NOT DETECTED NOT DETECTED   Klebsiella oxytoca NOT DETECTED NOT DETECTED   Klebsiella pneumoniae NOT DETECTED NOT DETECTED   Proteus species NOT DETECTED NOT DETECTED   Serratia marcescens NOT DETECTED NOT DETECTED   Haemophilus influenzae NOT DETECTED NOT DETECTED   Neisseria meningitidis NOT DETECTED NOT DETECTED   Pseudomonas aeruginosa NOT DETECTED NOT DETECTED   Candida albicans NOT DETECTED NOT DETECTED   Candida glabrata NOT DETECTED NOT DETECTED   Candida krusei NOT DETECTED NOT DETECTED   Candida parapsilosis NOT DETECTED NOT DETECTED   Candida tropicalis NOT DETECTED NOT DETECTED    Caryl Pina 05/18/2018  3:27 AM

## 2018-05-18 NOTE — Progress Notes (Signed)
Wasted 80ml of Fentanyl in the controlled substance container.  Witnessed by Brien Mates, RN

## 2018-05-18 NOTE — Progress Notes (Signed)
Transported PT to CT

## 2018-05-19 DIAGNOSIS — K56609 Unspecified intestinal obstruction, unspecified as to partial versus complete obstruction: Secondary | ICD-10-CM

## 2018-05-19 HISTORY — DX: Unspecified intestinal obstruction, unspecified as to partial versus complete obstruction: K56.609

## 2018-05-19 LAB — CBC
HCT: 28 % — ABNORMAL LOW (ref 36.0–46.0)
Hemoglobin: 9 g/dL — ABNORMAL LOW (ref 12.0–15.0)
MCH: 26.6 pg (ref 26.0–34.0)
MCHC: 32.1 g/dL (ref 30.0–36.0)
MCV: 82.8 fL (ref 80.0–100.0)
Platelets: 368 10*3/uL (ref 150–400)
RBC: 3.38 MIL/uL — ABNORMAL LOW (ref 3.87–5.11)
RDW: 16.5 % — ABNORMAL HIGH (ref 11.5–15.5)
WBC: 13.4 10*3/uL — ABNORMAL HIGH (ref 4.0–10.5)
nRBC: 0 % (ref 0.0–0.2)

## 2018-05-19 LAB — GLUCOSE, CAPILLARY
GLUCOSE-CAPILLARY: 132 mg/dL — AB (ref 70–99)
Glucose-Capillary: 110 mg/dL — ABNORMAL HIGH (ref 70–99)
Glucose-Capillary: 132 mg/dL — ABNORMAL HIGH (ref 70–99)
Glucose-Capillary: 136 mg/dL — ABNORMAL HIGH (ref 70–99)
Glucose-Capillary: 160 mg/dL — ABNORMAL HIGH (ref 70–99)
Glucose-Capillary: 93 mg/dL (ref 70–99)

## 2018-05-19 LAB — MAGNESIUM: Magnesium: 2.3 mg/dL (ref 1.7–2.4)

## 2018-05-19 LAB — BASIC METABOLIC PANEL
Anion gap: 12 (ref 5–15)
BUN: 20 mg/dL (ref 6–20)
CO2: 19 mmol/L — ABNORMAL LOW (ref 22–32)
Calcium: 8.4 mg/dL — ABNORMAL LOW (ref 8.9–10.3)
Chloride: 119 mmol/L — ABNORMAL HIGH (ref 98–111)
Creatinine, Ser: 1.09 mg/dL — ABNORMAL HIGH (ref 0.44–1.00)
GFR calc non Af Amer: 56 mL/min — ABNORMAL LOW (ref >60)
Glucose, Bld: 107 mg/dL — ABNORMAL HIGH (ref 70–99)
Potassium: 3.6 mmol/L (ref 3.5–5.1)
Sodium: 150 mmol/L — ABNORMAL HIGH (ref 135–145)

## 2018-05-19 LAB — PHOSPHORUS: Phosphorus: 2 mg/dL — ABNORMAL LOW (ref 2.5–4.6)

## 2018-05-19 MED ORDER — TRAVASOL 10 % IV SOLN
INTRAVENOUS | Status: AC
  Administered 2018-05-19: 18:00:00 via INTRAVENOUS
  Filled 2018-05-19: qty 556.8

## 2018-05-19 MED ORDER — SODIUM PHOSPHATES 45 MMOLE/15ML IV SOLN
20.0000 mmol | Freq: Once | INTRAVENOUS | Status: DC
  Filled 2018-05-19: qty 6.67

## 2018-05-19 MED ORDER — CHLORHEXIDINE GLUCONATE 0.12 % MT SOLN
15.0000 mL | Freq: Two times a day (BID) | OROMUCOSAL | Status: DC
  Administered 2018-05-19 – 2018-06-04 (×25): 15 mL via OROMUCOSAL
  Filled 2018-05-19 (×26): qty 15

## 2018-05-19 MED ORDER — SODIUM CHLORIDE 0.9 % IV SOLN
2.0000 g | Freq: Three times a day (TID) | INTRAVENOUS | Status: AC
  Administered 2018-05-19 – 2018-05-26 (×22): 2 g via INTRAVENOUS
  Filled 2018-05-19 (×23): qty 2

## 2018-05-19 MED ORDER — POTASSIUM PHOSPHATES 15 MMOLE/5ML IV SOLN
10.0000 mmol | Freq: Once | INTRAVENOUS | Status: AC
  Administered 2018-05-19: 10 mmol via INTRAVENOUS
  Filled 2018-05-19: qty 3.33

## 2018-05-19 MED ORDER — POTASSIUM CHLORIDE 10 MEQ/50ML IV SOLN
10.0000 meq | INTRAVENOUS | Status: AC
  Administered 2018-05-19 (×3): 10 meq via INTRAVENOUS
  Filled 2018-05-19 (×3): qty 50

## 2018-05-19 MED ORDER — ACETAMINOPHEN 10 MG/ML IV SOLN
1000.0000 mg | Freq: Four times a day (QID) | INTRAVENOUS | Status: AC | PRN
  Filled 2018-05-19: qty 100

## 2018-05-19 MED ORDER — ORAL CARE MOUTH RINSE
15.0000 mL | Freq: Two times a day (BID) | OROMUCOSAL | Status: DC
  Administered 2018-05-19 – 2018-06-04 (×15): 15 mL via OROMUCOSAL

## 2018-05-19 NOTE — Progress Notes (Signed)
NAME:  Wendy [name], MRN:  [id number], DOB:  [birth year], LOS: 5 ADMISSION DATE:  05/14/2018, CONSULTATION DATE: 05/14/2018 REFERRING MD:  Dr. Wilson Singer, CHIEF COMPLAINT:  Abd pain  Brief History   58 year old female who presented with 3-day history of abdominal pain, emesis, anorexia, and polyuria for the past 3 days.  Labs notable hyperglycemia 771 with anion gap of 21.  CT abdomen pelvis showed small bowel obstruction with pneumoperitoneum and she underwent ex lap with small bowel resection.  History of present illness    58 year old female with a history of morbid obesity diabetes, GERD and hyperlipidemia who came in yesterday with a 3-day history of vomiting and abdominal pain also noted to have polyuria and decreased appetite for the past 3 days.  She had not been taking any of her home diabetes medications as prescribed.  In the ER she was found to be febrile, tachycardic and normotensive.  Labs are notable for a glucose of some 71, anion gap of 21, corrected sodium 141 potassium, chloride 89, BUN 40, creatinine 2.18 (baseline around 0.69).  CT abdomen pelvis showed diffuse small bowel obstruction with pneumoperitoneum.  She was given 3 L normal saline and started on insulin drip.  Given cefepime and Flagyl.  While in the ER patient she had worsening of her mental status requiring intubation. Surgery was consulted and took her for a ex lap for they performed a small bowel resection.  Patient was admitted to the ICU due to her critical condition.  Past Medical History   Past Medical History:  Diagnosis Date  . Acute eczema   . Allergy   . Anemia   . Anxiety   . Chronic constipation with overflow   . Diabetes mellitus without complication ([place])   . GERD (gastroesophageal reflux disease)   . Herpetic vesicle in vagina   . Hyperlipidemia   . Hypertension   . Leg edema   . Morbid obesity ([place])   . Peri-menopause   . Snoring      Significant Hospital Events   1/27 > Admitted to hospital >  OR > ICU  Consults:  Surgery  Procedures:  1/27 > OR for ex lap with small bowel resection 1/29 > OR for anastomosis and closure  Significant Diagnostic Tests:  1/27 CT abd/pelvis > SBO with pneumoperitoneum  Micro Data:  1/27 blood culture> Gram variable rod 1/27 urine culture> No growth 1/27 respiratory culture>   1/27 peritoneal culture> E. Faceium 1/27 MRSA> negative 1/29 Blood cultures > NGTD  Antimicrobials:  Metronidazole 1/27>  Cefepime 1/27> Anidulafungin 1/27 > 1/28  Interim history/subjective:  No events overnight. Received fentanyl OTO last night for pain control. Awake and following commands this morning.   Objective   Blood pressure (!) 147/73, pulse (!) 112, temperature 99 F (37.2 C), temperature source Oral, resp. rate 19, height 5\' 3"  (1.6 m), weight 116.1 kg, last menstrual period 01/26/2017, SpO2 100 %.    Vent Mode: CPAP;PSV FiO2 (%):  [30 %-40 %] 30 % Set Rate:  [16 bmp] 16 bmp Vt Set:  [450 mL] 450 mL PEEP:  [5 cmH20] 5 cmH20 Pressure Support:  [5 cmH20] 5 cmH20 Plateau Pressure:  [16 cmH20-18 cmH20] 18 cmH20   Intake/Output Summary (Last 24 hours) at 05/19/2018 1220 Last data filed at 05/19/2018 1200 Gross per 24 hour  Intake 1154.74 ml  Output 3445 ml  Net -2290.26 ml   Filed Weights   05/17/18 0420 05/18/18 0430 05/19/18 0452  Weight: 119 kg 118.1 kg 116.1  kg   Physical Exam: General: Well-appearing, no acute distress HENT: [place], AT, OP clear, ETT in place Eyes: EOMI, no scleral icterus Respiratory: Clear to auscultation bilaterally.  No crackles, wheezing or rales Cardiovascular: RRR, -M/R/G, no JVD GI: Soft, wound with VAC in place. Extremities:-Edema,-tenderness Neuro: Awake, follows commands. Extremities moves x 4 Skin: Intact, no rashes or bruising GU: Foley in place  Resolved Hospital Problem list   Septic shock secondary to peritonits DKA Post-op fever AKI  Assessment & Plan:   Acute hypoxemic respiratory failure -  Tolerating SBT - Mental status improved - Extubate  Acute metabolic encephalopathy - CT head negative - Minimize sedatives - PRN tylenol for pain control  Small bowel obstruction with perforation s/p resection and anastomosis and wound closure Ileus - Management per Surgery: LIWS, TPN  Septic shock secondary to enterococcus faecium peritonitis in setting of recent surgery and ?bacteremia - Shock has resolved. S/p Cefepime and flagyl x 6 d - BCx 1/27 +  For gram variable rod. BCx 1/29 NGTD - F/u final cultures - Based on OR and blood CX, broadened abx to Meropenem though clinical response has been adequate on prior antibiotics. Will plan treat for total 14d. End abx 05/26/18  Best practice:  Diet: TPN Pain/Anxiety/Delirium protocol (if indicated): Fentanyl PRN, hold for possible extubation VAP protocol (if indicated): yes DVT prophylaxis: SCDs GI prophylaxis: PPI Glucose control: Lantus and SSI Mobility: Bedrest Code Status: Full Family Communication:  Disposition: Remain in ICU  Labs   CBC: Recent Labs  Lab 05/14/18 1133  05/14/18 2014 05/15/18 0328 05/16/18 0318 05/17/18 0314 05/18/18 0759 05/18/18 1535 05/19/18 0521  WBC 22.5*  --  14.8* 18.9* 16.9* 18.0* 16.6*  --  13.4*  NEUTROABS 19.1*  --  12.0*  --   --   --   --   --   --   HGB 13.2   < > 11.4* 10.8* 9.6* 10.0* 8.4* 8.5* 9.0*  HCT 41.7   < > 35.5* 35.0* 30.6* 31.8* 25.4* 25.0* 28.0*  MCV 81.9  --  82.8 83.9 83.4 82.4 79.9*  --  82.8  PLT 370  --  256 275 209 258 293  --  368   < > = values in this interval not displayed.    Basic Metabolic Panel: Recent Labs  Lab 05/14/18 2014  05/15/18 0328  05/15/18 1037 05/16/18 0318 05/17/18 0314 05/18/18 0421 05/18/18 1535 05/19/18 0521  NA 137   < > 140   < > 140 141 143 149* 150* 150*  K 3.4*   < > 4.1   < > 3.6 3.6 3.5 3.4* 3.4* 3.6  CL 106   < > 113*   < > 112* 113* 114* 119*  --  119*  CO2 19*   < > 21*   < > 21* 21* 21* 22  --  19*  GLUCOSE 339*   <  > 139*   < > 171* 147* 139* 144*  --  107*  BUN 31*   < > 27*   < > 25* 16 15 20   --  20  CREATININE 1.59*   < > 1.39*   < > 1.24* 1.08* 1.00 1.12*  --  1.09*  CALCIUM 8.9   < > 8.6*   < > 8.2* 8.5* 7.9* 8.0*  --  8.4*  MG 1.8  --  1.9  --   --   --   --   --   --  2.3  PHOS 2.5  --  2.3*  --   --   --   --   --   --  2.0*   < > = values in this interval not displayed.   GFR: Estimated Creatinine Clearance: 70 mL/min (A) (by C-G formula based on SCr of 1.09 mg/dL (H)). Recent Labs  Lab 05/14/18 1648 05/14/18 2014 05/14/18 2047 05/14/18 2223  05/16/18 0318 05/17/18 0314 05/18/18 0759 05/19/18 0521  PROCALCITON  --  25.12  --   --   --   --   --   --   --   WBC  --  14.8*  --   --    < > 16.9* 18.0* 16.6* 13.4*  LATICACIDVEN 5.2*  --  1.2 1.3  --   --   --   --   --    < > = values in this interval not displayed.    Liver Function Tests: Recent Labs  Lab 05/14/18 2014  AST 49*  ALT 29  ALKPHOS 78  BILITOT 1.4*  PROT 5.1*  ALBUMIN 2.7*   Recent Labs  Lab 05/14/18 2014  LIPASE 37  AMYLASE 60   No results for input(s): AMMONIA in the last 168 hours.  ABG    Component Value Date/Time   PHART 7.309 (L) 05/18/2018 1535   PCO2ART 41.8 05/18/2018 1535   PO2ART 92.0 05/18/2018 1535   HCO3 21.0 05/18/2018 1535   TCO2 22 05/18/2018 1535   ACIDBASEDEF 5.0 (H) 05/18/2018 1535   O2SAT 96.0 05/18/2018 1535     Coagulation Profile: Recent Labs  Lab 05/14/18 2014  INR 1.44    Cardiac Enzymes: No results for input(s): CKTOTAL, CKMB, CKMBINDEX, TROPONINI in the last 168 hours.  HbA1C: HbA1c, POC (controlled diabetic range)  Date/Time Value Ref Range Status  02/06/2018 05:16 PM 8.2 (A) 0.0 - 7.0 % Final  11/06/2017 12:15 PM 9.7 (A) 0.0 - 7.0 % Final    CBG: Recent Labs  Lab 05/18/18 2023 05/18/18 2358 05/19/18 0413 05/19/18 0803 05/19/18 Sandy    Rodman Pickle, M.D. [facility] Pulmonary/Critical Care Medicine Pager:  [phone] After hours pager: [phone]

## 2018-05-19 NOTE — Progress Notes (Signed)
PHARMACY - ADULT TOTAL PARENTERAL NUTRITION CONSULT NOTE   Pharmacy Consult for TPN Indication: ileus  Patient Measurements: Height: 5\' 3"  (160 cm) Weight: 255 lb 15.3 oz (116.1 kg) IBW/kg (Calculated) : 52.4 TPN AdjBW (KG): 68.8 Body mass index is 45.34 kg/m. Usual Weight: 117.9 kg  Assessment:  58 y/o female presenting to the ED with 3 days of vomiting and abdominal pain found to be in DKA. CT showed SBO. Noted SBO, pneumoperitoneum, intussusception with necrosis and performation and was taken to the OR emergently for ex lap, SBR left in discontinuity, and abthera VAC on 1/27. Taken back to the OR on 1/29 for ex lap with SB anastomosis and fascia closure.   Husband reports patient eating well prior to experiencing GI distress.  GI: 100 ml NGT bilious output and likely ileus, PPI IV Endo: hx DM, CBGs controlled on moderate SSI, Lantus 15 units/d Insulin requirements in the past 24 hours: 4 units Lytes: Na high 150, K low 3.6 (goal >= 4), Mag pending (goal >=2) Renal: AKI resolving, SCr down to 1.09, UOP 0.4 ml/kg/hr, net +1.3L Pulm: on vent Cards: MAP 90-100s, tachy Hepatobil: AST mild elevation, ALT WNL, T bili mild elevation Neuro: fentanyl drip ID: Vanc/[email or web address]/Flagyl for IAI, Tm 99.3, WBC 13.4  TPN Access: double lumen CVC placed 05/14/18 TPN start date: 05/19/18  Nutritional Goals (per RD recommendation on 05/16/18): KCal: 8250-5397 Protein: 105-131 g Fluid: >= 1.8L  Goal TPN rate is 83 ml/hr (provides 1396 kcal, 115 g protein)  Current Nutrition:  NPO  Plan:  Begin TPN at 40 mL/hr. Hold 20% lipid emulsion for first 7 days for ICU patients per ASPEN guidelines (Start date 05/22/18) This TPN provides 55 g of protein, 132 g of dextrose, and no lipids which provides 673 kCals per day, meeting 48% of patient needs Electrolytes in TPN: lower Na, max acetate Add MVI, trace elements to TPN Continue moderate SSI and adjust as needed KCl 10 meq IV q1h x3 Monitor TPN labs  in am   Renold Genta, PharmD, BCPS Clinical Pharmacist Clinical phone for 05/19/2018 until 3p is [phone] 05/19/2018 9:17 AM  **Pharmacist phone directory can now be found on [email or web address] listed under [place]**

## 2018-05-19 NOTE — Progress Notes (Signed)
Pt noted to have increased NG output of approx 2000cc vs 150cc from dayshift.  Called answering service for [facility] to request call from doctor on call.  Will awaiting return call.  Will continue to monitor patient

## 2018-05-19 NOTE — Procedures (Signed)
Extubation Procedure Note  Patient Details:   Name: Wendy [name] DOB: [birth year] MRN: [id number]   Airway Documentation:    Vent end date: 05/19/18 Vent end time: 1328   Evaluation  O2 sats: stable throughout Complications: No apparent complications Patient did tolerate procedure well. Bilateral Breath Sounds: Clear, Diminished    PT was extubated to a 3L [place]  PT was able to cough and talk. Cough is weak but productive   sats are stable  RT to monitor  Yes  Coe Angelos, Leonie Douglas 05/19/2018, 1:29 PM

## 2018-05-19 NOTE — Progress Notes (Addendum)
[facility]/Trauma Progress Note  3 Days Post-Op   Assessment/Plan DKA- per medicine, improving  Sepsis- per primary SBO,Pneumoperitoneum, intussusception with necrosis & perforation - S/P ex lap, small bowel resection, closure with abthera vac, Dr. Grandville Silos, 01/17 - S/P Exploratory laparotomy with small bowel anastomosis and closure of abdominal fascia, Dr. Brantley Stage, 01/29 - cont NGT to LIWS 2/2 high bilious NGT output and likely Ileus   FEN:NPO, NGT to LIWS, TNA VTE: SCD's, start lovenox 01/30 VZ:[id number]/33-29/51, Vanc 01/27; 01/31>>,Cefepime &Flagyl 01/27>>afebrile, blood cultures 01/29 NGTD Foley:yes Follow up:TBD  DISPO:continue IV abx, continue NGT to LIWS, start TNA   LOS: 5 days    Subjective: CC: S/P SBR  Pt denies pain. Husband at bedside. Pt is awake and alert on vent.   Objective: Vital signs in last 24 hours: Temp:  [98.5 F (36.9 C)-99.3 F (37.4 C)] 99.3 F (37.4 C) (02/01 0405) Pulse Rate:  [102-124] 110 (02/01 0748) Resp:  [10-23] 17 (02/01 0748) BP: (127-178)/(68-96) 137/78 (02/01 0748) SpO2:  [100 %] 100 % (02/01 0748) FiO2 (%):  [30 %-40 %] 30 % (02/01 0748) Weight:  [116.1 kg] 116.1 kg (02/01 0452) Last BM Date: (pta)  Intake/Output from previous day: 01/31 0701 - 02/01 0700 In: 1181.6 [I.V.:74.2; IV Piggyback:1107.4] Out: 3205 [Urine:1005; Emesis/NG output:2200] Intake/Output this shift: No intake/output data recorded.  PE: Gen: Alert, NAD, on vent, follows commands Card:tachycardic Pulm:On vent Abd: Soft,ND, vac in place, generalized TTP with guarding, few BS appreciated, vac with scant serosanguinous drainage, NGT output high and bilious  Skin: no rashes noted, warm and dry   Anti-infectives: Anti-infectives (From admission, onward)   Start     Dose/Rate Route Frequency Ordered Stop   05/19/18 1400  vancomycin (VANCOCIN) 1,250 mg in sodium chloride 0.9 % 250 mL IVPB     1,250 mg 166.7 mL/hr  over 90 Minutes Intravenous Every 24 hours 05/18/18 1343     05/18/18 1600  cefTRIAXone (ROCEPHIN) 2 g in sodium chloride 0.9 % 100 mL IVPB     2 g 200 mL/hr over 30 Minutes Intravenous Every 24 hours 05/18/18 1326     05/18/18 1400  vancomycin (VANCOCIN) 2,000 mg in sodium chloride 0.9 % 500 mL IVPB     2,000 mg 250 mL/hr over 120 Minutes Intravenous  Once 05/18/18 1343 05/18/18 1640   05/15/18 2333  ceFEPIme (MAXIPIME) 2 g in sodium chloride 0.9 % 100 mL IVPB  Status:  Discontinued     2 g 200 mL/hr over 30 Minutes Intravenous Every 8 hours 05/15/18 2226 05/18/18 1326   05/15/18 1500  ceFEPIme (MAXIPIME) 1 g in sodium chloride 0.9 % 100 mL IVPB  Status:  Discontinued     1 g 200 mL/hr over 30 Minutes Intravenous Every 24 hours 05/14/18 1512 05/15/18 2226   05/15/18 0000  anidulafungin (ERAXIS) 100 mg in sodium chloride 0.9 % 100 mL IVPB  Status:  Discontinued     100 mg 78 mL/hr over 100 Minutes Intravenous Every 24 hours 05/14/18 1728 05/15/18 1159   05/14/18 1800  anidulafungin (ERAXIS) 200 mg in sodium chloride 0.9 % 200 mL IVPB     200 mg 78 mL/hr over 200 Minutes Intravenous  Once 05/14/18 1751 05/14/18 2138   05/14/18 1745  vancomycin (VANCOCIN) 2,000 mg in sodium chloride 0.9 % 500 mL IVPB  Status:  Discontinued     2,000 mg 250 mL/hr over 120 Minutes Intravenous  Once 05/14/18 1741 05/15/18 1159   05/14/18 1741  vancomycin variable dose  per unstable renal function (pharmacist dosing)  Status:  Discontinued      Does not apply See admin instructions 05/14/18 1741 05/15/18 1159   05/14/18 1515  ceFEPIme (MAXIPIME) 2 g in sodium chloride 0.9 % 100 mL IVPB     2 g 200 mL/hr over 30 Minutes Intravenous  Once 05/14/18 1512 05/14/18 1649   05/14/18 1515  metroNIDAZOLE (FLAGYL) IVPB 500 mg     500 mg 100 mL/hr over 60 Minutes Intravenous Every 8 hours 05/14/18 1512        Lab Results:  Recent Labs    05/18/18 0759 05/18/18 1535 05/19/18 0521  WBC 16.6*  --  13.4*  HGB 8.4*  8.5* 9.0*  HCT 25.4* 25.0* 28.0*  PLT 293  --  368   BMET Recent Labs    05/18/18 0421 05/18/18 1535 05/19/18 0521  NA 149* 150* 150*  K 3.4* 3.4* 3.6  CL 119*  --  119*  CO2 22  --  19*  GLUCOSE 144*  --  107*  BUN 20  --  20  CREATININE 1.12*  --  1.09*  CALCIUM 8.0*  --  8.4*   PT/INR No results for input(s): LABPROT, INR in the last 72 hours. CMP     Component Value Date/Time   NA 150 (H) 05/19/2018 0521   NA 144 12/25/2014 1536   K 3.6 05/19/2018 0521   CL 119 (H) 05/19/2018 0521   CO2 19 (L) 05/19/2018 0521   GLUCOSE 107 (H) 05/19/2018 0521   BUN 20 05/19/2018 0521   BUN 9 12/25/2014 1536   CREATININE 1.09 (H) 05/19/2018 0521   CREATININE 0.69 05/25/2017 1150   CALCIUM 8.4 (L) 05/19/2018 0521   PROT 5.1 (L) 05/14/2018 2014   PROT 6.7 12/25/2014 1536   ALBUMIN 2.7 (L) 05/14/2018 2014   ALBUMIN 4.4 12/25/2014 1536   AST 49 (H) 05/14/2018 2014   ALT 29 05/14/2018 2014   ALKPHOS 78 05/14/2018 2014   BILITOT 1.4 (H) 05/14/2018 2014   BILITOT 0.3 12/25/2014 1536   GFRNONAA 56 (L) 05/19/2018 0521   GFRNONAA 78 08/25/2016 1100   GFRAA >60 05/19/2018 0521   GFRAA >89 08/25/2016 1100   Lipase     Component Value Date/Time   LIPASE 37 05/14/2018 2014    Studies/Results: Ct Head Wo Contrast  Result Date: 05/18/2018 CLINICAL DATA:  Altered level of consciousness. Patient following commands intermittently. EXAM: CT HEAD WITHOUT CONTRAST TECHNIQUE: Contiguous axial images were obtained from the base of the skull through the vertex without intravenous contrast. COMPARISON:  None. FINDINGS: Brain: No evidence of acute infarction, hemorrhage, hydrocephalus, extra-axial collection or mass lesion/mass effect. Vascular: No hyperdense vessel or unexpected calcification. Skull: Normal. Negative for fracture or focal lesion. Sinuses/Orbits: Globes and orbits are unremarkable. There is mild mucosal thickening lining the ethmoid air cells and left sphenoid sinus with minimal  right sphenoid sinus and maxillary sinus mucosal thickening. Other: None. IMPRESSION: 1. No intracranial abnormalities. 2. Sinus mucosal thickening. Electronically Signed   By: Lajean Manes M.D.   On: 05/18/2018 14:29      Kalman Drape , [facility] Surgery 05/19/2018, 8:01 AM  Pager: [phone] Mon-Wed, Friday 7:00am-4:30pm Thurs 7am-11:30am  Consults: [phone]   Agree with above. TNA since she will likely have prolonged ileus. Wound VAC  Imogene Burn. Georgette Dover, MD, [facility] Surgery  General/ Trauma Surgery Beeper [phone]  05/19/2018 10:13 AM

## 2018-05-19 NOTE — Progress Notes (Signed)
Nutrition Follow Up Note   DOCUMENTATION CODES:   Morbid obesity  INTERVENTION:   TPN per pharmacy   Pt at high refeed risk; recommend monitor K, Mg and P labs daily   Recommend thiamine 166m daily x 3 days   Recommend daily weights   NUTRITION DIAGNOSIS:   Inadequate oral intake related to altered GI function as evidenced by NPO status.  GOAL:   Patient will meet greater than or equal to 90% of their needs  -not met   MONITOR:   Diet advancement, Labs, Weight trends, Skin, I & O's, Other (Comment)(TPN)  ASSESSMENT:   58yo female admitted with DKA, SBO with intussusception with necrosis and perforation s/p SB resection left in discontinuity on 1/27 with SB anastomosis and closure of abdominal fascia on 1/29. On vent postop. PMH includes HTN, DM, GERD, HLD   1/27 Ex Lap, SB resection, left in discontinuity, closure with abthera vac. Vent post-op (81cm small bowel removed ~20 cm from the ileocecal valve) 1/29- S/PExploratorylaparotomy with small bowel anastomosis and closure of abdominal fascia, Dr. CBrantley Stage Pt extubated today. Pt with prolonged ileus. RD received consult for new TPN. Pt at high refeeding risk; recommend monitor K, Mg and P labs daily. Per chart, pt weight stable since admit. NGT in place with 22073moutput x 24 hrs; 5506mutput today. Recommend daily weights. Phosphorus low today; this is being supplemented.   Medications reviewed and include: lovenox, insulin, protonix, meropenem, Kphos   Labs reviewed: Na 150(H), K 3.6 wnl, P 2.0(L), Mg 2.3 wnl Wbc- 13.4(H), Hgb 9.0(L), Hct 28.0(L) cbgs- 93, 110, 132 x 24 hrs AIC 8.1(H)- 2/7  Diet Order:   Diet Order            Diet NPO time specified  Diet effective now             EDUCATION NEEDS:   Not appropriate for education at this time  Skin:  Skin Assessment: Reviewed RN Assessment  Last BM:  no documented BM  Height:   Ht Readings from Last 1 Encounters:  05/14/18 5' 3"  (1.6 m)     Weight:   Wt Readings from Last 1 Encounters:  05/19/18 116.1 kg    Ideal Body Weight:  52.3 kg  BMI:  Body mass index is 45.34 kg/m.  Estimated Nutritional Needs:   Kcal:  2100-2300kcal/day   Protein:  116-128g/day   Fluid:  >1.6L/day   CasKoleen Distance, RD, LDN Pager #- [phone]#- [phone] Hours Pager: [phone]

## 2018-05-20 DIAGNOSIS — F05 Delirium due to known physiological condition: Secondary | ICD-10-CM

## 2018-05-20 LAB — DIFFERENTIAL
ABS IMMATURE GRANULOCYTES: 0.3 10*3/uL — AB (ref 0.00–0.07)
Basophils Absolute: 0 10*3/uL (ref 0.0–0.1)
Basophils Relative: 0 %
Eosinophils Absolute: 0.3 10*3/uL (ref 0.0–0.5)
Eosinophils Relative: 3 %
Immature Granulocytes: 2 %
Lymphocytes Relative: 21 %
Lymphs Abs: 2.9 10*3/uL (ref 0.7–4.0)
Monocytes Absolute: 1.4 10*3/uL — ABNORMAL HIGH (ref 0.1–1.0)
Monocytes Relative: 10 %
Neutro Abs: 8.5 10*3/uL — ABNORMAL HIGH (ref 1.7–7.7)
Neutrophils Relative %: 64 %

## 2018-05-20 LAB — COMPREHENSIVE METABOLIC PANEL
ALK PHOS: 51 U/L (ref 38–126)
ALT: 21 U/L (ref 0–44)
AST: 12 U/L — ABNORMAL LOW (ref 15–41)
Albumin: 1.8 g/dL — ABNORMAL LOW (ref 3.5–5.0)
Anion gap: 10 (ref 5–15)
BUN: 18 mg/dL (ref 6–20)
CALCIUM: 8.3 mg/dL — AB (ref 8.9–10.3)
CO2: 22 mmol/L (ref 22–32)
Chloride: 120 mmol/L — ABNORMAL HIGH (ref 98–111)
Creatinine, Ser: 0.8 mg/dL (ref 0.44–1.00)
GFR calc non Af Amer: >60 mL/min (ref >60)
Glucose, Bld: 214 mg/dL — ABNORMAL HIGH (ref 70–99)
Potassium: 3.4 mmol/L — ABNORMAL LOW (ref 3.5–5.1)
Sodium: 152 mmol/L — ABNORMAL HIGH (ref 135–145)
Total Bilirubin: 0.2 mg/dL — ABNORMAL LOW (ref 0.3–1.2)
Total Protein: 5 g/dL — ABNORMAL LOW (ref 6.5–8.1)

## 2018-05-20 LAB — CULTURE, BLOOD (ROUTINE X 2)
Culture: NO GROWTH
Special Requests: ADEQUATE

## 2018-05-20 LAB — CBC
HCT: 26.5 % — ABNORMAL LOW (ref 36.0–46.0)
Hemoglobin: 8.3 g/dL — ABNORMAL LOW (ref 12.0–15.0)
MCH: 25.5 pg — ABNORMAL LOW (ref 26.0–34.0)
MCHC: 31.3 g/dL (ref 30.0–36.0)
MCV: 81.5 fL (ref 80.0–100.0)
PLATELETS: 423 10*3/uL — AB (ref 150–400)
RBC: 3.25 MIL/uL — ABNORMAL LOW (ref 3.87–5.11)
RDW: 16.4 % — AB (ref 11.5–15.5)
WBC: 13.4 10*3/uL — ABNORMAL HIGH (ref 4.0–10.5)
nRBC: 0 % (ref 0.0–0.2)

## 2018-05-20 LAB — PHOSPHORUS
Phosphorus: 1.3 mg/dL — ABNORMAL LOW (ref 2.5–4.6)
Phosphorus: 2.1 mg/dL — ABNORMAL LOW (ref 2.5–4.6)

## 2018-05-20 LAB — POCT I-STAT 7, (LYTES, BLD GAS, ICA,H+H)
Bicarbonate: 22.3 mmol/L (ref 20.0–28.0)
Calcium, Ion: 1.23 mmol/L (ref 1.15–1.40)
HCT: 47 % — ABNORMAL HIGH (ref 36.0–46.0)
Hemoglobin: 16 g/dL — ABNORMAL HIGH (ref 12.0–15.0)
O2 Saturation: 83 %
PO2 ART: 44 mmHg — AB (ref 83.0–108.0)
Potassium: 3.6 mmol/L (ref 3.5–5.1)
SODIUM: 151 mmol/L — AB (ref 135–145)
TCO2: 23 mmol/L (ref 22–32)
pCO2 arterial: 31 mmHg — ABNORMAL LOW (ref 32.0–48.0)
pH, Arterial: 7.465 — ABNORMAL HIGH (ref 7.350–7.450)

## 2018-05-20 LAB — GLUCOSE, CAPILLARY
Glucose-Capillary: 188 mg/dL — ABNORMAL HIGH (ref 70–99)
Glucose-Capillary: 190 mg/dL — ABNORMAL HIGH (ref 70–99)
Glucose-Capillary: 206 mg/dL — ABNORMAL HIGH (ref 70–99)
Glucose-Capillary: 219 mg/dL — ABNORMAL HIGH (ref 70–99)
Glucose-Capillary: 254 mg/dL — ABNORMAL HIGH (ref 70–99)
Glucose-Capillary: 255 mg/dL — ABNORMAL HIGH (ref 70–99)
Glucose-Capillary: 278 mg/dL — ABNORMAL HIGH (ref 70–99)

## 2018-05-20 LAB — POTASSIUM: Potassium: 5.2 mmol/L — ABNORMAL HIGH (ref 3.5–5.1)

## 2018-05-20 LAB — PREALBUMIN: Prealbumin: 10.1 mg/dL — ABNORMAL LOW (ref 18–38)

## 2018-05-20 LAB — AEROBIC/ANAEROBIC CULTURE W GRAM STAIN (SURGICAL/DEEP WOUND): Gram Stain: NONE SEEN

## 2018-05-20 LAB — MAGNESIUM: MAGNESIUM: 2.2 mg/dL (ref 1.7–2.4)

## 2018-05-20 LAB — TRIGLYCERIDES: Triglycerides: 158 mg/dL — ABNORMAL HIGH (ref <150)

## 2018-05-20 MED ORDER — FENTANYL CITRATE (PF) 100 MCG/2ML IJ SOLN: 100.0000 ug | Freq: Once | INTRAMUSCULAR | Status: DC

## 2018-05-20 MED ORDER — FENTANYL CITRATE (PF) 100 MCG/2ML IJ SOLN
INTRAMUSCULAR | Status: AC
  Filled 2018-05-20: qty 2

## 2018-05-20 MED ORDER — ETOMIDATE 2 MG/ML IV SOLN: 20.0000 mg | Freq: Once | INTRAVENOUS | Status: DC

## 2018-05-20 MED ORDER — SUCCINYLCHOLINE CHLORIDE 20 MG/ML IJ SOLN
100.0000 mg | Freq: Once | INTRAMUSCULAR | Status: DC
  Filled 2018-05-20: qty 5

## 2018-05-20 MED ORDER — INSULIN ASPART 100 UNIT/ML ~~LOC~~ SOLN
0.0000 [IU] | SUBCUTANEOUS | Status: DC
  Administered 2018-05-20 (×2): 11 [IU] via SUBCUTANEOUS
  Administered 2018-05-20 – 2018-05-21 (×2): 7 [IU] via SUBCUTANEOUS
  Administered 2018-05-21: 4 [IU] via SUBCUTANEOUS
  Administered 2018-05-21: 11 [IU] via SUBCUTANEOUS
  Administered 2018-05-21: 4 [IU] via SUBCUTANEOUS
  Administered 2018-05-21: 7 [IU] via SUBCUTANEOUS
  Administered 2018-05-22: 4 [IU] via SUBCUTANEOUS
  Administered 2018-05-22 (×4): 7 [IU] via SUBCUTANEOUS
  Administered 2018-05-22: 4 [IU] via SUBCUTANEOUS
  Administered 2018-05-23: 2 [IU] via SUBCUTANEOUS
  Administered 2018-05-23 (×2): 4 [IU] via SUBCUTANEOUS
  Administered 2018-05-23: 7 [IU] via SUBCUTANEOUS
  Administered 2018-05-23 – 2018-05-24 (×4): 4 [IU] via SUBCUTANEOUS
  Administered 2018-05-24: 3 [IU] via SUBCUTANEOUS
  Administered 2018-05-24: 4 [IU] via SUBCUTANEOUS
  Administered 2018-05-24: 7 [IU] via SUBCUTANEOUS
  Administered 2018-05-25: 4 [IU] via SUBCUTANEOUS
  Administered 2018-05-25: 3 [IU] via SUBCUTANEOUS
  Administered 2018-05-25 (×2): 4 [IU] via SUBCUTANEOUS
  Administered 2018-05-25 – 2018-05-27 (×4): 3 [IU] via SUBCUTANEOUS
  Administered 2018-05-27 (×2): 4 [IU] via SUBCUTANEOUS
  Administered 2018-05-27 – 2018-05-28 (×2): 3 [IU] via SUBCUTANEOUS
  Administered 2018-05-28 (×3): 4 [IU] via SUBCUTANEOUS
  Administered 2018-05-28 – 2018-05-31 (×11): 3 [IU] via SUBCUTANEOUS

## 2018-05-20 MED ORDER — ROCURONIUM BROMIDE 50 MG/5ML IV SOLN
100.0000 mg | Freq: Once | INTRAVENOUS | Status: DC
  Filled 2018-05-20: qty 10

## 2018-05-20 MED ORDER — POTASSIUM CHLORIDE 10 MEQ/50ML IV SOLN
10.0000 meq | INTRAVENOUS | Status: AC
  Administered 2018-05-20 (×2): 10 meq via INTRAVENOUS
  Filled 2018-05-20 (×2): qty 50

## 2018-05-20 MED ORDER — POTASSIUM PHOSPHATES 15 MMOLE/5ML IV SOLN
30.0000 mmol | Freq: Once | INTRAVENOUS | Status: AC
  Administered 2018-05-20: 30 mmol via INTRAVENOUS
  Filled 2018-05-20: qty 10

## 2018-05-20 MED ORDER — LIP MEDEX EX OINT
TOPICAL_OINTMENT | CUTANEOUS | Status: DC | PRN
  Administered 2018-05-20: 1 via TOPICAL
  Filled 2018-05-20: qty 7

## 2018-05-20 MED ORDER — TRACE MINERALS CR-CU-MN-SE-ZN 10-1000-500-60 MCG/ML IV SOLN
INTRAVENOUS | Status: AC
  Administered 2018-05-20: 17:00:00 via INTRAVENOUS
  Filled 2018-05-20: qty 576

## 2018-05-20 NOTE — Progress Notes (Signed)
SLP Cancellation Note  Patient Details Name: Wendy [name] MRN: [id number] DOB: [birth year]   Cancelled treatment:       Reason Eval/Treat Not Completed: Medical issues which prohibited therapy  Per nurse there is concern for an ileus.  ST will follow up next date in attempt to complete clinical swallowing evaluation if patient is appropriate for PO intake.   Shelly Flatten, MA, [facility] SLP [phone] Lamar Sprinkles 05/20/2018, 7:26 AM

## 2018-05-20 NOTE — Progress Notes (Signed)
Results of ABG : Ph 7.46 PCO2 31 PO2 44 )2 at 5 L n/c when ABG was drawn - increased to 6 L n/c. Pt a little more aware. Husband trying to keep pt awake & encouraging pt to take deep breaths. Breath sounds clear upper lobes diminished bilat lower lobes

## 2018-05-20 NOTE — Progress Notes (Signed)
[facility]/Trauma Progress Note  4 Days Post-Op   Assessment/Plan DKA- per medicine, improving  Sepsis- per primary SBO,Pneumoperitoneum, intussusception with necrosis & perforation - S/P ex lap, small bowel resection, closure with abthera vac, Dr. Grandville Silos, 01/17 -S/PExploratorylaparotomy with small bowel anastomosis and closure of abdominal fascia, Dr. Brantley Stage, 01/29 - cont NGT to LIWS2/2 high bilious NGT output and likely Ileus   FEN:NPO, NGTto LIWS, TNA VTE: SCD's, start lovenox 01/30 WH:[id number], Vanc 01/27; 01/31>>,Cefepime &Flagyl 01/27>>afebrile, blood cultures 01/29 NGTD Foley:yes Follow up:TBD  DISPO:continue NGT to LIWS, TNA, ambulate   LOS: 6 days    Subjective: CC: S/P SBR  Pt is awake but very drowsy. Husband at bedside states she had lots of questions yesterday but she is slow to wake today. No issues overnight.   Objective: Vital signs in last 24 hours: Temp:  [98.6 F (37 C)-99.1 F (37.3 C)] 99.1 F (37.3 C) (02/02 0415) Pulse Rate:  [99-113] 103 (02/02 0500) Resp:  [14-34] 34 (02/02 0700) BP: (132-151)/(62-84) 141/62 (02/02 0700) SpO2:  [100 %] 100 % (02/02 0500) FiO2 (%):  [30 %] 30 % (02/01 1136) Weight:  [114.5 kg] 114.5 kg (02/02 0500) Last BM Date: (PTA)  Intake/Output from previous day: 02/01 0701 - 02/02 0700 In: 1355.8 [I.V.:548.1; IV Piggyback:807.7] Out: 1820 [Urine:760; Emesis/NG output:1050; Drains:10] Intake/Output this shift: No intake/output data recorded.  PE: Gen: Alert, NAD, sleepy Card:tachycardic Pulm:rate and effort normal Abd: Soft,ND, vac in place, mild generalized TTP with guarding, few BS appreciated, vac with scant serosanguinous drainage, NGT output high and bilious  Skin: no rashes noted, warm and dry   Anti-infectives: Anti-infectives (From admission, onward)   Start     Dose/Rate Route Frequency Ordered Stop   05/19/18 1400  vancomycin (VANCOCIN) 1,250 mg  in sodium chloride 0.9 % 250 mL IVPB  Status:  Discontinued     1,250 mg 166.7 mL/hr over 90 Minutes Intravenous Every 24 hours 05/18/18 1343 05/19/18 1244   05/19/18 1400  meropenem (MERREM) 2 g in sodium chloride 0.9 % 100 mL IVPB     2 g 200 mL/hr over 30 Minutes Intravenous Every 8 hours 05/19/18 1244 05/26/18 2359   05/18/18 1600  cefTRIAXone (ROCEPHIN) 2 g in sodium chloride 0.9 % 100 mL IVPB  Status:  Discontinued     2 g 200 mL/hr over 30 Minutes Intravenous Every 24 hours 05/18/18 1326 05/19/18 1244   05/18/18 1400  vancomycin (VANCOCIN) 2,000 mg in sodium chloride 0.9 % 500 mL IVPB     2,000 mg 250 mL/hr over 120 Minutes Intravenous  Once 05/18/18 1343 05/18/18 1640   05/15/18 2333  ceFEPIme (MAXIPIME) 2 g in sodium chloride 0.9 % 100 mL IVPB  Status:  Discontinued     2 g 200 mL/hr over 30 Minutes Intravenous Every 8 hours 05/15/18 2226 05/18/18 1326   05/15/18 1500  ceFEPIme (MAXIPIME) 1 g in sodium chloride 0.9 % 100 mL IVPB  Status:  Discontinued     1 g 200 mL/hr over 30 Minutes Intravenous Every 24 hours 05/14/18 1512 05/15/18 2226   05/15/18 0000  anidulafungin (ERAXIS) 100 mg in sodium chloride 0.9 % 100 mL IVPB  Status:  Discontinued     100 mg 78 mL/hr over 100 Minutes Intravenous Every 24 hours 05/14/18 1728 05/15/18 1159   05/14/18 1800  anidulafungin (ERAXIS) 200 mg in sodium chloride 0.9 % 200 mL IVPB     200 mg 78 mL/hr over 200 Minutes Intravenous  Once 05/14/18 1751  05/14/18 2138   05/14/18 1745  vancomycin (VANCOCIN) 2,000 mg in sodium chloride 0.9 % 500 mL IVPB  Status:  Discontinued     2,000 mg 250 mL/hr over 120 Minutes Intravenous  Once 05/14/18 1741 05/15/18 1159   05/14/18 1741  vancomycin variable dose per unstable renal function (pharmacist dosing)  Status:  Discontinued      Does not apply See admin instructions 05/14/18 1741 05/15/18 1159   05/14/18 1515  ceFEPIme (MAXIPIME) 2 g in sodium chloride 0.9 % 100 mL IVPB     2 g 200 mL/hr over 30  Minutes Intravenous  Once 05/14/18 1512 05/14/18 1649   05/14/18 1515  metroNIDAZOLE (FLAGYL) IVPB 500 mg  Status:  Discontinued     500 mg 100 mL/hr over 60 Minutes Intravenous Every 8 hours 05/14/18 1512 05/19/18 1244      Lab Results:  Recent Labs    05/19/18 0521 05/20/18 0334  WBC 13.4* 13.4*  HGB 9.0* 8.3*  HCT 28.0* 26.5*  PLT 368 423*   BMET Recent Labs    05/19/18 0521 05/20/18 0334  NA 150* 152*  K 3.6 3.4*  CL 119* 120*  CO2 19* 22  GLUCOSE 107* 214*  BUN 20 18  CREATININE 1.09* 0.80  CALCIUM 8.4* 8.3*   PT/INR No results for input(s): LABPROT, INR in the last 72 hours. CMP     Component Value Date/Time   NA 152 (H) 05/20/2018 0334   NA 144 12/25/2014 1536   K 3.4 (L) 05/20/2018 0334   CL 120 (H) 05/20/2018 0334   CO2 22 05/20/2018 0334   GLUCOSE 214 (H) 05/20/2018 0334   BUN 18 05/20/2018 0334   BUN 9 12/25/2014 1536   CREATININE 0.80 05/20/2018 0334   CREATININE 0.69 05/25/2017 1150   CALCIUM 8.3 (L) 05/20/2018 0334   PROT 5.0 (L) 05/20/2018 0334   PROT 6.7 12/25/2014 1536   ALBUMIN 1.8 (L) 05/20/2018 0334   ALBUMIN 4.4 12/25/2014 1536   AST 12 (L) 05/20/2018 0334   ALT 21 05/20/2018 0334   ALKPHOS 51 05/20/2018 0334   BILITOT 0.2 (L) 05/20/2018 0334   BILITOT 0.3 12/25/2014 1536   GFRNONAA >60 05/20/2018 0334   GFRNONAA 78 08/25/2016 1100   GFRAA >60 05/20/2018 0334   GFRAA >89 08/25/2016 1100   Lipase     Component Value Date/Time   LIPASE 37 05/14/2018 2014    Studies/Results: Ct Head Wo Contrast  Result Date: 05/18/2018 CLINICAL DATA:  Altered level of consciousness. Patient following commands intermittently. EXAM: CT HEAD WITHOUT CONTRAST TECHNIQUE: Contiguous axial images were obtained from the base of the skull through the vertex without intravenous contrast. COMPARISON:  None. FINDINGS: Brain: No evidence of acute infarction, hemorrhage, hydrocephalus, extra-axial collection or mass lesion/mass effect. Vascular: No hyperdense  vessel or unexpected calcification. Skull: Normal. Negative for fracture or focal lesion. Sinuses/Orbits: Globes and orbits are unremarkable. There is mild mucosal thickening lining the ethmoid air cells and left sphenoid sinus with minimal right sphenoid sinus and maxillary sinus mucosal thickening. Other: None. IMPRESSION: 1. No intracranial abnormalities. 2. Sinus mucosal thickening. Electronically Signed   By: Lajean Manes M.D.   On: 05/18/2018 14:29      Kalman Drape , [facility] 05/20/2018, 8:30 AM  Pager: [phone] Mon-Wed, Friday 7:00am-4:30pm Thurs 7am-11:30am  Consults: [phone]

## 2018-05-20 NOTE — Progress Notes (Signed)
PT Cancellation Note  Patient Details Name: Wendy [name] MRN: [id number] DOB: November 03, 1960   Cancelled Treatment:    Reason Eval/Treat Not Completed: Patient not medically ready   Discussed pt status with [name], RN, who indicated pt was lethargic/confused, and we have concern for her respiratory status;   Will hold PT at this time and plan to follow for appropriateness of PT eval;   Roney Marion, [place] Pager [phone] Office [phone]    Colletta [place] 05/20/2018, 1:52 PM

## 2018-05-20 NOTE — Progress Notes (Signed)
PHARMACY - ADULT TOTAL PARENTERAL NUTRITION CONSULT NOTE   Pharmacy Consult for TPN Indication: ileus  Patient Measurements: Height: 5\' 3"  (160 cm) Weight: 252 lb 6.8 oz (114.5 kg) IBW/kg (Calculated) : 52.4 TPN AdjBW (KG): 68.8 Body mass index is 44.72 kg/m. Usual Weight: 117.9 kg  Assessment:  58 y/o female presenting to the ED with 3 days of vomiting and abdominal pain found to be in DKA. CT showed SBO. Noted SBO, pneumoperitoneum, intussusception with necrosis and performation and was taken to the OR emergently for ex lap, SBR left in discontinuity, and abthera VAC on 1/27. Taken back to the OR on 1/29 for ex lap with SB anastomosis and fascia closure. High risk for refeeding.  Husband reports patient eating well prior to experiencing GI distress.  GI: prealbumin low 10.1, 1050 ml NGT output and likely ileus, PPI IV Endo: hx DM, CBGs 110-254 on moderate SSI, Lantus 15 units/d as TPN adjusted, also KPhos in dextrose Insulin requirements in the past 24 hours: 13 units Lytes: Na high 151, K low 3.6 (goal >= 4), Mag 2.2 (goal >=2), Phos 1.3 - replacing Renal: AKI resolved, SCr 0.8, UOP 0.3 ml/kg/hr, net +0.9L Pulm: extubated Cards: MAP 80-90s, tachy Hepatobil: LFT and T bili WNL, TG 158 Neuro: awake but drowsy ID: [id number], meropenem for IAI, Tm 99.1, WBC 13.4  TPN Access: double lumen CVC placed 05/14/18 TPN start date: 05/19/18  Nutritional Goals (per RD recommendation on 05/19/18): KCal: 2100-2300 Protein: 116-128 g Fluid: >1.6L  Goal TPN rate is 83 ml/hr (provides 2171 kcal, 120 g protein)  Current Nutrition:  TPN NPO  Plan:  Re-calculated TPN based on RD recommendation on 2/1 Increase TPN conservatively to 60 mL/hr. Hold 20% lipid emulsion for first 7 days for ICU patients per ASPEN guidelines (Start date 05/22/18) This TPN provides 86 g of protein (Clinisol 15%), 360 g of dextrose, and no lipids which provides 1554 kCals per day, meeting 71% of patient  needs Electrolytes in TPN: lower Na, max acetate - increase lytes with increased TPN rate Add MVI, trace elements to TPN Change to resistant SSI and adjust as needed  Add 7 units insulin to TPN KPhos 30 mmol IV x1 per MD KCl 10 meq q1h x2 Monitor TPN labs, check K and Phos at 20:00   Renold Genta, PharmD, BCPS Clinical Pharmacist Clinical phone for 05/20/2018 until 3p is [phone] 05/20/2018 7:24 AM  **Pharmacist phone directory can now be found on [email or web address] listed under [email or web address]**

## 2018-05-20 NOTE — Progress Notes (Signed)
OT Cancellation Note  Patient Details Name: Wendy [name] MRN: [id number] DOB: 06/04/1960   Cancelled Treatment:    Reason Eval/Treat Not Completed: Medical issues which prohibited therapy(Pt lethargic and with poor respiratory status. RN request hold. Will return as schedule allow. Thank you. )  [place] MSOT, OTR/L Acute Rehab Pager: [phone] Office: [phone] 05/20/2018, 1:56 PM

## 2018-05-20 NOTE — Progress Notes (Addendum)
Pt lethargic - unable to follow commands - will open eyes to speech unable to do incentive spirometer - 02 sats not reading with good waveform - changed o2 Sat probe from Lt ear to Lt hand picking up at 87 % with good waveform. Continuous stimulation to get pt more awake and alert by staff and husband - 02 increased to 4 L n/c than 5 and than 6 to keep sats up - a little more awake now. Pt is confused knows name only cannot tell staff where she currently is. DR Loanne Drilling aware of pt's current condition. Ordered and ABG done at 0900.

## 2018-05-20 NOTE — Progress Notes (Signed)
NAME:  Wendy [name], MRN:  [id number], DOB:  1960/07/13, LOS: 6 ADMISSION DATE:  05/14/2018, CONSULTATION DATE: 05/14/2018 REFERRING MD:  Dr. Wilson Singer, CHIEF COMPLAINT:  Abd pain  Brief History   58 year old female who presented with 3-day history of abdominal pain, emesis, anorexia, and polyuria for the past 3 days.  Labs notable hyperglycemia 771 with anion gap of 21.  CT abdomen pelvis showed small bowel obstruction with pneumoperitoneum and she underwent ex lap with small bowel resection.  History of present illness    58 year old female with a history of morbid obesity diabetes, GERD and hyperlipidemia who came in yesterday with a 3-day history of vomiting and abdominal pain also noted to have polyuria and decreased appetite for the past 3 days.  She had not been taking any of her home diabetes medications as prescribed.  In the ER she was found to be febrile, tachycardic and normotensive.  Labs are notable for a glucose of some 71, anion gap of 21, corrected sodium 141 potassium, chloride 89, BUN 40, creatinine 2.18 (baseline around 0.69).  CT abdomen pelvis showed diffuse small bowel obstruction with pneumoperitoneum.  She was given 3 L normal saline and started on insulin drip.  Given cefepime and Flagyl.  While in the ER patient she had worsening of her mental status requiring intubation. Surgery was consulted and took her for a ex lap for they performed a small bowel resection.  Patient was admitted to the ICU due to her critical condition.  Past Medical History   Past Medical History:  Diagnosis Date  . Acute eczema   . Allergy   . Anemia   . Anxiety   . Chronic constipation with overflow   . Diabetes mellitus without complication ([place])   . GERD (gastroesophageal reflux disease)   . Herpetic vesicle in vagina   . Hyperlipidemia   . Hypertension   . Leg edema   . Morbid obesity ([place])   . Peri-menopause   . Snoring      Significant Hospital Events   1/27 > Admitted to hospital >  OR > ICU 1/27 > OR for ex lap with small bowel resection 1/29 > OR for anastomosis and closure 2/1> Extubated 2/2> Hypoactive delirium, worsening respiratory status  Consults:  Surgery  Procedures:  1/27 > OR for ex lap with small bowel resection 1/29 > OR for anastomosis and closure 2/1> Extubated  Significant Diagnostic Tests:  1/27 CT abd/pelvis > SBO with pneumoperitoneum  Micro Data:  1/27 blood culture> Gram variable rod 1/27 urine culture> No growth 1/27 respiratory culture>   1/27 peritoneal culture> E. Faceium 1/27 MRSA> negative 1/29 Blood cultures > NGTD  Antimicrobials:  Metronidazole 1/27>  Cefepime 1/27> Anidulafungin 1/27 > 1/28  Interim history/subjective:  Extubated yesterday. Lethargic this morning. Last IV pain meds at 0330 AM. Worsening hypoxemia increasing O2 from 2-6L. Patient unable to participate with IS. Discussed re-intubation with husband however he wishes to engage with patient more first.  Objective   Blood pressure 134/71, pulse (!) 117, temperature 98.4 F (36.9 C), temperature source Oral, resp. rate 18, height 5\' 3"  (1.6 m), weight 114.5 kg, last menstrual period 01/26/2017, SpO2 90 %.    Vent Mode: CPAP;PSV FiO2 (%):  [30 %] 30 % PEEP:  [5 cmH20] 5 cmH20 Pressure Support:  [5 cmH20] 5 cmH20   Intake/Output Summary (Last 24 hours) at 05/20/2018 1006 Last data filed at 05/20/2018 0800 Gross per 24 hour  Intake 1245.8 ml  Output 1305 ml  Net -59.2 ml   Filed Weights   05/18/18 0430 05/19/18 0452 05/20/18 0500  Weight: 118.1 kg 116.1 kg 114.5 kg   Physical Exam: General: Well-appearing, no acute distress HENT: [place], AT, OP clear, ETT in place Eyes: EOMI, no scleral icterus Respiratory: Clear to auscultation bilaterally.  No crackles, wheezing or rales Cardiovascular: RRR, -M/R/G, no JVD GI: Soft, wound with VAC in place. Extremities:-Edema,-tenderness Neuro: Awake, follows commands. Extremities moves x 4 Skin: Intact, no rashes or  bruising GU: Foley in place  Physical Exam: General: Lethargic, easily awakened, unable to follow commands, tracks and turns head, no acute distress HENT: [place], AT, OP clear, MMM Eyes: EOMI, no scleral icterus Respiratory: Clear to auscultation bilaterally.  No crackles, wheezing or rales Cardiovascular: RRR, -M/R/G, no JVD GI: BS+, soft, nontender Extremities:-Edema,-tenderness Neuro: Lethargic but easily awakened, moves extremities x 4 Skin: Intact, no rashes or bruising GU: Foley in place  Resolved Hospital Problem list   Septic shock secondary to peritonits DKA Post-op fever AKI  Assessment & Plan:   Acute hypoxemic respiratory failure Intubated for surgery. Extubated 2/1. Required increased O2 this morning.  - Sit patient in chair position - Encourage IS - Likely plan for intubation pending mental status  Acute metabolic encephalopathy Hypoactive delirium CT head negative - Minimize sedatives - PRN tylenol for pain control  Small bowel obstruction with perforation s/p resection and anastomosis and wound closure Ileus - Management per Surgery: LIWS, TPN  Septic shock secondary to enterococcus faecium peritonitis in setting of recent surgery and ?bacteremia Shock has resolved. S/p Cefepime and flagyl x 6 d - BCx 1/27 +  For gram variable rod. BCx 1/29 NGTD - F/u final cultures - Based on OR and blood CX, broadened abx to Meropenem though clinical response has been adequate on prior antibiotics. Will plan treat for total 14d. End abx 05/26/18  Best practice:  Diet: TPN Pain/Anxiety/Delirium protocol (if indicated): Fentanyl PRN, hold for possible extubation VAP protocol (if indicated): yes DVT prophylaxis: SCDs GI prophylaxis: PPI Glucose control: Lantus and SSI Mobility: Bedrest Code Status: Full Family Communication: Husband at bedside Disposition: Remain in ICU  Labs   CBC: Recent Labs  Lab 05/14/18 1133  05/14/18 2014  05/16/18 0318 05/17/18 0314  05/18/18 0759 05/18/18 1535 05/19/18 0521 05/20/18 0334 05/20/18 0913  WBC 22.5*  --  14.8*   < > 16.9* 18.0* 16.6*  --  13.4* 13.4*  --   NEUTROABS 19.1*  --  12.0*  --   --   --   --   --   --  8.5*  --   HGB 13.2   < > 11.4*   < > 9.6* 10.0* 8.4* 8.5* 9.0* 8.3* 16.0*  HCT 41.7   < > 35.5*   < > 30.6* 31.8* 25.4* 25.0* 28.0* 26.5* 47.0*  MCV 81.9  --  82.8   < > 83.4 82.4 79.9*  --  82.8 81.5  --   PLT 370  --  256   < > 209 258 293  --  368 423*  --    < > = values in this interval not displayed.    Basic Metabolic Panel: Recent Labs  Lab 05/14/18 2014  05/15/18 0328  05/16/18 0318 05/17/18 0314 05/18/18 0421 05/18/18 1535 05/19/18 0521 05/20/18 0334 05/20/18 0913  NA 137   < > 140   < > 141 143 149* 150* 150* 152* 151*  K 3.4*   < > 4.1   < >  3.6 3.5 3.4* 3.4* 3.6 3.4* 3.6  CL 106   < > 113*   < > 113* 114* 119*  --  119* 120*  --   CO2 19*   < > 21*   < > 21* 21* 22  --  19* 22  --   GLUCOSE 339*   < > 139*   < > 147* 139* 144*  --  107* 214*  --   BUN 31*   < > 27*   < > 16 15 20   --  20 18  --   CREATININE 1.59*   < > 1.39*   < > 1.08* 1.00 1.12*  --  1.09* 0.80  --   CALCIUM 8.9   < > 8.6*   < > 8.5* 7.9* 8.0*  --  8.4* 8.3*  --   MG 1.8  --  1.9  --   --   --   --   --  2.3 2.2  --   PHOS 2.5  --  2.3*  --   --   --   --   --  2.0* 1.3*  --    < > = values in this interval not displayed.   GFR: Estimated Creatinine Clearance: 94.6 mL/min (by C-G formula based on SCr of 0.8 mg/dL). Recent Labs  Lab 05/14/18 1648 05/14/18 2014 05/14/18 2047 05/14/18 2223  05/17/18 0314 05/18/18 0759 05/19/18 0521 05/20/18 0334  PROCALCITON  --  25.12  --   --   --   --   --   --   --   WBC  --  14.8*  --   --    < > 18.0* 16.6* 13.4* 13.4*  LATICACIDVEN 5.2*  --  1.2 1.3  --   --   --   --   --    < > = values in this interval not displayed.    Liver Function Tests: Recent Labs  Lab 05/14/18 2014 05/20/18 0334  AST 49* 12*  ALT 29 21  ALKPHOS 78 51  BILITOT 1.4*  0.2*  PROT 5.1* 5.0*  ALBUMIN 2.7* 1.8*   Recent Labs  Lab 05/14/18 2014  LIPASE 37  AMYLASE 60   No results for input(s): AMMONIA in the last 168 hours.  ABG    Component Value Date/Time   PHART 7.465 (H) 05/20/2018 0913   PCO2ART 31.0 (L) 05/20/2018 0913   PO2ART 44.0 (L) 05/20/2018 0913   HCO3 22.3 05/20/2018 0913   TCO2 23 05/20/2018 0913   ACIDBASEDEF 5.0 (H) 05/18/2018 1535   O2SAT 83.0 05/20/2018 0913     Coagulation Profile: Recent Labs  Lab 05/14/18 2014  INR 1.44    Cardiac Enzymes: No results for input(s): CKTOTAL, CKMB, CKMBINDEX, TROPONINI in the last 168 hours.  HbA1C: HbA1c, POC (controlled diabetic range)  Date/Time Value Ref Range Status  02/06/2018 05:16 PM 8.2 (A) 0.0 - 7.0 % Final  11/06/2017 12:15 PM 9.7 (A) 0.0 - 7.0 % Final    CBG: Recent Labs  Lab 05/19/18 1638 05/19/18 2019 05/20/18 0009 05/20/18 0352 05/20/18 0844  GLUCAP 132* 160* 188* 190* 254*    The patient is critically ill with multiple organ systems failure and requires high complexity decision making for assessment and support, frequent evaluation and titration of therapies, application of advanced monitoring technologies and extensive interpretation of multiple databases.   Critical Care Time devoted to patient care services described in this note is 40 Minutes. This time  reflects time of care of this signee Dr. Rodman Pickle. This critical care time does not reflect procedure time, or teaching time or supervisory time of PA/NP/Med student/Med Resident etc but could involve care discussion time.

## 2018-05-20 NOTE — Progress Notes (Signed)
[facility] Progress Note Patient Name: Marquia Costello DOB: [birth year] MRN: [id number]   Date of Service  05/20/2018  HPI/Events of Note  K+ = 3.4, PO4--- = 1.3 and Creatinine = 1.8.   eICU Interventions  Will replace K+ and PO4---.     Intervention Category Major Interventions: Electrolyte abnormality - evaluation and management  Sommer,Steven Eugene 05/20/2018, 6:26 AM

## 2018-05-21 DIAGNOSIS — R5381 Other malaise: Secondary | ICD-10-CM

## 2018-05-21 DIAGNOSIS — A419 Sepsis, unspecified organism: Secondary | ICD-10-CM

## 2018-05-21 LAB — COMPREHENSIVE METABOLIC PANEL
ALT: 17 U/L (ref 0–44)
ALT: 18 U/L (ref 0–44)
AST: 14 U/L — ABNORMAL LOW (ref 15–41)
AST: 20 U/L (ref 15–41)
Albumin: 1.8 g/dL — ABNORMAL LOW (ref 3.5–5.0)
Albumin: 2 g/dL — ABNORMAL LOW (ref 3.5–5.0)
Alkaline Phosphatase: 50 U/L (ref 38–126)
Alkaline Phosphatase: 58 U/L (ref 38–126)
Anion gap: 9 (ref 5–15)
Anion gap: 10 (ref 5–15)
BILIRUBIN TOTAL: 0.4 mg/dL (ref 0.3–1.2)
BUN: 17 mg/dL (ref 6–20)
BUN: 15 mg/dL (ref 6–20)
CO2: 25 mmol/L (ref 22–32)
CO2: 25 mmol/L (ref 22–32)
Calcium: 8.2 mg/dL — ABNORMAL LOW (ref 8.9–10.3)
Calcium: 8.5 mg/dL — ABNORMAL LOW (ref 8.9–10.3)
Chloride: 121 mmol/L — ABNORMAL HIGH (ref 98–111)
Chloride: 120 mmol/L — ABNORMAL HIGH (ref 98–111)
Creatinine, Ser: 0.79 mg/dL (ref 0.44–1.00)
Creatinine, Ser: 0.74 mg/dL (ref 0.44–1.00)
GFR calc Af Amer: >60 mL/min (ref >60)
GFR calc non Af Amer: >60 mL/min (ref >60)
GFR calc non Af Amer: >60 mL/min (ref >60)
Glucose, Bld: 301 mg/dL — ABNORMAL HIGH (ref 70–99)
Glucose, Bld: 149 mg/dL — ABNORMAL HIGH (ref 70–99)
POTASSIUM: 3.7 mmol/L (ref 3.5–5.1)
Potassium: 3.7 mmol/L (ref 3.5–5.1)
Sodium: 155 mmol/L — ABNORMAL HIGH (ref 135–145)
Sodium: 155 mmol/L — ABNORMAL HIGH (ref 135–145)
TOTAL PROTEIN: 5 g/dL — AB (ref 6.5–8.1)
Total Bilirubin: 0.3 mg/dL (ref 0.3–1.2)
Total Protein: 5.7 g/dL — ABNORMAL LOW (ref 6.5–8.1)

## 2018-05-21 LAB — PREALBUMIN: Prealbumin: 14.4 mg/dL — ABNORMAL LOW (ref 18–38)

## 2018-05-21 LAB — CBC
HCT: 25.3 % — ABNORMAL LOW (ref 36.0–46.0)
Hemoglobin: 8.3 g/dL — ABNORMAL LOW (ref 12.0–15.0)
MCH: 26.8 pg (ref 26.0–34.0)
MCHC: 32.8 g/dL (ref 30.0–36.0)
MCV: 81.6 fL (ref 80.0–100.0)
Platelets: 450 10*3/uL — ABNORMAL HIGH (ref 150–400)
RBC: 3.1 MIL/uL — ABNORMAL LOW (ref 3.87–5.11)
RDW: 16.2 % — ABNORMAL HIGH (ref 11.5–15.5)
WBC: 18 10*3/uL — ABNORMAL HIGH (ref 4.0–10.5)
nRBC: 0.2 % (ref 0.0–0.2)

## 2018-05-21 LAB — GLUCOSE, CAPILLARY
Glucose-Capillary: 277 mg/dL — ABNORMAL HIGH (ref 70–99)
Glucose-Capillary: 245 mg/dL — ABNORMAL HIGH (ref 70–99)
Glucose-Capillary: 181 mg/dL — ABNORMAL HIGH (ref 70–99)
Glucose-Capillary: 119 mg/dL — ABNORMAL HIGH (ref 70–99)
Glucose-Capillary: 196 mg/dL — ABNORMAL HIGH (ref 70–99)

## 2018-05-21 LAB — DIFFERENTIAL
Abs Immature Granulocytes: 0.62 10*3/uL — ABNORMAL HIGH (ref 0.00–0.07)
Basophils Absolute: 0 10*3/uL (ref 0.0–0.1)
Basophils Relative: 0 %
Eosinophils Absolute: 0.3 10*3/uL (ref 0.0–0.5)
Eosinophils Relative: 2 %
Immature Granulocytes: 3 %
LYMPHS ABS: 3.1 10*3/uL (ref 0.7–4.0)
Lymphocytes Relative: 17 %
Monocytes Absolute: 1.3 10*3/uL — ABNORMAL HIGH (ref 0.1–1.0)
Monocytes Relative: 7 %
Neutro Abs: 12.7 10*3/uL — ABNORMAL HIGH (ref 1.7–7.7)
Neutrophils Relative %: 71 %

## 2018-05-21 LAB — BASIC METABOLIC PANEL
Anion gap: 10 (ref 5–15)
BUN: 16 mg/dL (ref 6–20)
CALCIUM: 8.4 mg/dL — AB (ref 8.9–10.3)
CHLORIDE: 119 mmol/L — AB (ref 98–111)
CO2: 23 mmol/L (ref 22–32)
Creatinine, Ser: 0.78 mg/dL (ref 0.44–1.00)
GFR calc Af Amer: >60 mL/min (ref >60)
GFR calc non Af Amer: >60 mL/min (ref >60)
Glucose, Bld: 241 mg/dL — ABNORMAL HIGH (ref 70–99)
Potassium: 4.5 mmol/L (ref 3.5–5.1)
Sodium: 152 mmol/L — ABNORMAL HIGH (ref 135–145)

## 2018-05-21 LAB — CULTURE, BLOOD (ROUTINE X 2)
CULTURE: NO GROWTH
Culture: NO GROWTH
Special Requests: ADEQUATE

## 2018-05-21 LAB — MAGNESIUM: Magnesium: 2.1 mg/dL (ref 1.7–2.4)

## 2018-05-21 LAB — TRIGLYCERIDES: Triglycerides: 147 mg/dL (ref <150)

## 2018-05-21 LAB — PHOSPHORUS: Phosphorus: 2.1 mg/dL — ABNORMAL LOW (ref 2.5–4.6)

## 2018-05-21 MED ORDER — TRACE MINERALS CR-CU-MN-SE-ZN 10-1000-500-60 MCG/ML IV SOLN
INTRAVENOUS | Status: AC
  Administered 2018-05-21: 18:00:00 via INTRAVENOUS
  Filled 2018-05-21: qty 576

## 2018-05-21 MED ORDER — SODIUM CHLORIDE 0.9% FLUSH
10.0000 mL | INTRAVENOUS | Status: DC | PRN
  Administered 2018-05-24: 20 mL
  Filled 2018-05-21: qty 40

## 2018-05-21 MED ORDER — DEXTROSE 5 % IV SOLN
INTRAVENOUS | Status: AC
  Administered 2018-05-21: 17:00:00 via INTRAVENOUS

## 2018-05-21 MED ORDER — CHLORHEXIDINE GLUCONATE CLOTH 2 % EX PADS
6.0000 | MEDICATED_PAD | Freq: Every day | CUTANEOUS | Status: DC
  Administered 2018-05-21 – 2018-06-03 (×12): 6 via TOPICAL

## 2018-05-21 MED ORDER — SODIUM CHLORIDE 0.9% FLUSH
10.0000 mL | INTRAVENOUS | Status: DC | PRN
  Administered 2018-05-21: 10 mL
  Filled 2018-05-21: qty 40

## 2018-05-21 MED ORDER — POTASSIUM PHOSPHATES 15 MMOLE/5ML IV SOLN
20.0000 mmol | Freq: Once | INTRAVENOUS | Status: AC
  Administered 2018-05-21: 20 mmol via INTRAVENOUS
  Filled 2018-05-21 (×2): qty 6.67

## 2018-05-21 MED ORDER — SODIUM CHLORIDE 0.9% FLUSH
10.0000 mL | Freq: Two times a day (BID) | INTRAVENOUS | Status: DC
  Administered 2018-05-21: 10 mL

## 2018-05-21 MED ORDER — TRACE MINERALS CR-CU-MN-SE-ZN 10-1000-500-60 MCG/ML IV SOLN
INTRAVENOUS | Status: DC
  Filled 2018-05-21: qty 576

## 2018-05-21 MED ORDER — SODIUM CHLORIDE 0.9% FLUSH
10.0000 mL | Freq: Two times a day (BID) | INTRAVENOUS | Status: DC
  Administered 2018-05-24 – 2018-06-02 (×6): 10 mL

## 2018-05-21 MED ORDER — ALTEPLASE 2 MG IJ SOLR
2.0000 mg | Freq: Once | INTRAMUSCULAR | Status: AC
  Administered 2018-05-21: 2 mg

## 2018-05-21 MED ORDER — ALTEPLASE 2 MG IJ SOLR: 2.0000 mg | Freq: Once | INTRAMUSCULAR | Status: DC

## 2018-05-21 MED ORDER — DEXTROSE 5 % IV SOLN: INTRAVENOUS | Status: DC

## 2018-05-21 NOTE — Progress Notes (Signed)
SLP Cancellation Note  Patient Details Name: Wendy [name] MRN: [id number] DOB: December 26, 1960   Cancelled treatment:       Reason Eval/Treat Not Completed: Medical issues which prohibited therapy. Given open abdomen, NG tube for suction, TPN will d/c current order and await new order.   Herbie Baltimore, MA [place]  Acute Rehabilitation Services Pager [phone] Office [phone]  Lynann Beaver 05/21/2018, 8:43 AM

## 2018-05-21 NOTE — Consult Note (Signed)
Physical Medicine and Rehabilitation Consult Reason for Consult:  Decreased functional mobility Referring Physician: Critical care   HPI: Wendy [name] is a 58 y.o.right handed female with history of morbid obesity, hypertension, diabetes mellitus. Per chart review patient lives with spouse. Independent prior to admission. Two-level home with half bath on main level.Presented 05/14/2018 with 3 day history of vomiting with abdominal pain  As well as decrease in appetite.noted BUN 40 creatinine 2.18 with baseline of 0.69, WBC 22,000 as well as glucose 771. CT abdomen pelvis showed diffuse small bowel obstruction with pneumoperitoneum. Patient in the ED noted to be febrile tachycardic. Placed on broad-spectrum antibiotics. Underwent exploratory laparotomy small bowel resection closure with wound VAC 05/14/2018 per Dr. Grandville Silos. Hospital course hypotension requiring fluid bolus. Patient later underwent small bowel anastomosis closure of abdominal fascia 05/16/2018 per general surgery. TPN for nutritional support.. Subcutaneous Lovenox later added for DVT prophylaxis. Currently remains on IV antibiotics with Merrem. Therapy evaluation completed 05/21/2018 with recommendations of physical medicine rehabilitation consult.   Review of Systems  Constitutional: Positive for fever. Negative for chills.  HENT: Negative for hearing loss.   Eyes: Negative for blurred vision and double vision.  Respiratory: Negative for cough.   Cardiovascular: Positive for leg swelling. Negative for chest pain and palpitations.  Gastrointestinal: Positive for abdominal pain, nausea and vomiting.       GERD  Genitourinary: Negative for dysuria, flank pain and hematuria.  Musculoskeletal: Positive for myalgias.  Skin: Negative for rash.  Psychiatric/Behavioral:       Anxiety  All other systems reviewed and are negative.  Past Medical History:  Diagnosis Date  . Acute eczema   . Allergy   . Anemia   . Anxiety   .  Chronic constipation with overflow   . Diabetes mellitus without complication ([place])   . GERD (gastroesophageal reflux disease)   . Herpetic vesicle in vagina   . Hyperlipidemia   . Hypertension   . Leg edema   . Morbid obesity ([place])   . Peri-menopause   . Snoring    Past Surgical History:  Procedure Laterality Date  . CESAREAN SECTION  1993  . LAPAROTOMY N/A 05/14/2018   Procedure: EXPLORATORY LAPAROTOMY;  Surgeon: Georganna Skeans, MD;  Location: [place];  Service: General;  Laterality: N/A;  . LAPAROTOMY N/A 05/16/2018   Procedure: EXPLORATORY LAPAROTOMY with reanatomosis and fascia closure with wound vacum application;  Surgeon: Erroll Luna, MD;  Location: [facility];  Service: General;  Laterality: N/A;  . tonsillectomy and adnoidectomy    . WISDOM TOOTH EXTRACTION     Family History  Problem Relation Age of Onset  . Diabetes Mother   . Asthma Mother   . Heart disease Mother   . Heart disease Father   . Prostate cancer Father   . Other Father        blockages in stomach  . Kidney disease Paternal Grandmother   . Asthma Son   . Autism Son   . Breast cancer Neg Hx   . Colon cancer Neg Hx   . Esophageal cancer Neg Hx   . Pancreatic cancer Neg Hx   . Rectal cancer Neg Hx   . Stomach cancer Neg Hx    Social History:  reports that she has been smoking cigarettes. She started smoking about 47 years ago. She has smoked for the past 20.00 years. She has never used smokeless tobacco. She reports that she does not drink alcohol or use drugs. Allergies:  Allergies  Allergen Reactions  . Penicillins Swelling    Did it involve swelling of the face/tongue/throat, SOB, or low BP? Yes Did it involve sudden or severe rash/hives, skin peeling, or any reaction on the inside of your mouth or nose? No Did you need to seek medical attention at a hospital or doctor's office? Yes When did it last happen? unknown If all above answers are "NO", may proceed with cephalosporin use.     Medications Prior to Admission  Medication Sig Dispense Refill  . aspirin (ASPIRIN LOW DOSE) 81 MG chewable tablet Chew 1 tablet by mouth daily.    Marland Kitchen atorvastatin (LIPITOR) 40 MG tablet Take 1 tablet (40 mg total) by mouth every evening. 90 tablet 1  . Cholecalciferol (VITAMIN D) 2000 units CAPS Take 1 capsule (2,000 Units total) by mouth daily. 30 capsule 0  . Empagliflozin-metFORMIN HCl ER (SYNJARDY XR) 25-1000 MG TB24 Take 1 tablet by mouth daily. 90 tablet 1  . fluticasone (FLONASE) 50 MCG/ACT nasal spray Place 2 sprays into both nostrils daily. (Patient taking differently: Place 2 sprays into both nostrils daily as needed for allergies. ) 16 g 0  . insulin degludec (TRESIBA) 100 UNIT/ML SOPN FlexTouch Pen INJECT 20 TO 50 UNITS INTO THE SKIN DAILY (Patient taking differently: Inject 20-50 Units into the skin daily. ) 15 mL 0  . telmisartan-hydrochlorothiazide (MICARDIS HCT) 80-25 MG tablet TAKE 1 TABLET BY MOUTH DAILY. *INS ONLY PAYS FOR 30 DAYS* (Patient taking differently: Take 1 tablet by mouth daily. ) 90 tablet 1  . valACYclovir (VALTREX) 500 MG tablet Take 1 tablet (500 mg total) by mouth 3 (three) times daily as needed. And daily for prevention (Patient taking differently: Take 500 mg by mouth 3 (three) times daily as needed (for prevention). ) 100 tablet 1  . venlafaxine XR (EFFEXOR-XR) 150 MG 24 hr capsule Take 1 capsule (150 mg total) by mouth daily. 90 capsule 1  . B-12, Methylcobalamin, 1000 MCG SUBL Place 1 tablet under the tongue daily. (Patient not taking: Reported on 05/14/2018) 30 tablet 0  . Blood Glucose Monitoring Suppl (ONE TOUCH ULTRA 2) w/Device KIT See admin instructions.  0  . Insulin Pen Needle (NOVOFINE) 32G X 6 MM MISC 1 each by Does not apply route daily. 100 each 2  . ONE TOUCH ULTRA TEST test strip CHECK FASTING BLOOD SUGARS TWICE DAILY 100 each 2  . ONETOUCH DELICA LANCETS FINE MISC See admin instructions.  0    Home: Home Living Family/patient expects to be  discharged to:: Private residence Living Arrangements: Spouse/significant other Available Help at Discharge: Family, Available 24 hours/day Type of Home: House Home Access: Level entry Home Layout: Two level, 1/2 bath on main level, Bed/bath upstairs Alternate Level Stairs-Number of Steps: flight Alternate Level Stairs-Rails: Right, Left Bathroom Shower/Tub: Chiropodist: Standard Home Equipment: None  Functional History: Prior Function Level of Independence: Independent Functional Status:  Mobility: Bed Mobility Overal bed mobility: Needs Assistance Bed Mobility: Supine to Sit Supine to sit: Mod assist, +2 for physical assistance General bed mobility comments: +rail, cues for sequencing, increased time and effort Transfers Overall transfer level: Needs assistance Equipment used: None Transfers: Sit to/from Stand, Stand Pivot Transfers Sit to Stand: Mod assist, +2 physical assistance Stand pivot transfers: +2 physical assistance, Min assist General transfer comment: cues for hand placement, assist to power up and stabilize balance, pivot steps with cues for sequencing bed to recliner Ambulation/Gait General Gait Details: unable due to fatigue/weakness  ADL: ADL Overall ADL's : Needs assistance/impaired Eating/Feeding: NPO Grooming: Minimal assistance, Sitting Upper Body Bathing: Moderate assistance, Sitting Lower Body Bathing: Maximal assistance, Sit to/from stand Upper Body Dressing : Moderate assistance, Sitting Lower Body Dressing: Maximal assistance, Sit to/from stand Lower Body Dressing Details (indicate cue type and reason): Pt unable to bring ankles to knees or bend forward for donning socks. Max A required for donning socks Toilet Transfer: Minimal assistance, Moderate assistance, +2 for physical assistance, Stand-pivot(simulated to recliner) Toilet Transfer Details (indicate cue type and reason): Mod A +2 for power up into standing and then Min  A +2 for balance in pivot towards recliner Functional mobility during ADLs: Moderate assistance, +2 for physical assistance General ADL Comments: Pt presenting with decreased cognition, balance, strength, and activity tolerance. Pt requiring significant time to follow commands and answer questions.   Cognition: Cognition Overall Cognitive Status: Impaired/Different from baseline Orientation Level: Oriented to person, Oriented to time, Oriented to situation, Disoriented to place Cognition Arousal/Alertness: Awake/alert Behavior During Therapy: Flat affect Overall Cognitive Status: Impaired/Different from baseline Area of Impairment: Following commands, Problem solving, Attention, Orientation, Awareness Orientation Level: Disoriented to, Situation Current Attention Level: Selective Following Commands: Follows one step commands with increased time Awareness: Emergent Problem Solving: Slow processing, Decreased initiation, Difficulty sequencing, Requires verbal cues General Comments: slow to respond. Difficulty with word finding. Pt reports she feels like she is "slower" and husband confirms this is not baseline cognition  Blood pressure (!) 148/98, pulse (!) 105, temperature 99.2 F (37.3 C), temperature source Oral, resp. rate (!) 27, height 5' 3"  (1.6 m), weight 116 kg, last menstrual period 01/26/2017, SpO2 97 %. Physical Exam  Vitals reviewed. Constitutional:  lethargic  HENT:  Head: Normocephalic.  NGT  Eyes: Pupils are equal, round, and reactive to light.  Neck: Normal range of motion.  Cardiovascular: Normal rate.  Respiratory: Effort normal.  GI: There is abdominal tenderness.  Musculoskeletal:        General: Edema (LE,distal) present.  Neurological:  Lethargic female but arousable.   She did make eye contact with examiner and she was able to tell me that today was her birthday and appropriate age. Moves all 4, senses pain    Results for orders placed or performed during  the hospital encounter of 05/14/18 (from the past 24 hour(s))  Glucose, capillary     Status: Abnormal   Collection Time: 05/20/18  4:45 PM  Result Value Ref Range   Glucose-Capillary 206 (H) 70 - 99 mg/dL  Glucose, capillary     Status: Abnormal   Collection Time: 05/20/18  8:51 PM  Result Value Ref Range   Glucose-Capillary 255 (H) 70 - 99 mg/dL  Phosphorus     Status: Abnormal   Collection Time: 05/20/18 11:01 PM  Result Value Ref Range   Phosphorus 2.1 (L) 2.5 - 4.6 mg/dL  Potassium     Status: Abnormal   Collection Time: 05/20/18 11:01 PM  Result Value Ref Range   Potassium 5.2 (H) 3.5 - 5.1 mmol/L  Glucose, capillary     Status: Abnormal   Collection Time: 05/20/18 11:54 PM  Result Value Ref Range   Glucose-Capillary 219 (H) 70 - 99 mg/dL  Glucose, capillary     Status: Abnormal   Collection Time: 05/21/18  4:38 AM  Result Value Ref Range   Glucose-Capillary 277 (H) 70 - 99 mg/dL  Comprehensive metabolic panel     Status: Abnormal   Collection Time: 05/21/18  6:44 AM  Result Value  Ref Range   Sodium 155 (H) 135 - 145 mmol/L   Potassium 3.7 3.5 - 5.1 mmol/L   Chloride 121 (H) 98 - 111 mmol/L   CO2 25 22 - 32 mmol/L   Glucose, Bld 301 (H) 70 - 99 mg/dL   BUN 17 6 - 20 mg/dL   Creatinine, Ser 0.79 0.44 - 1.00 mg/dL   Calcium 8.2 (L) 8.9 - 10.3 mg/dL   Total Protein 5.0 (L) 6.5 - 8.1 g/dL   Albumin 1.8 (L) 3.5 - 5.0 g/dL   AST 14 (L) 15 - 41 U/L   ALT 17 0 - 44 U/L   Alkaline Phosphatase 50 38 - 126 U/L   Total Bilirubin 0.4 0.3 - 1.2 mg/dL   GFR calc non Af Amer >60 >60 mL/min   GFR calc Af Amer >60 >60 mL/min   Anion gap 9 5 - 15  Magnesium     Status: None   Collection Time: 05/21/18  6:44 AM  Result Value Ref Range   Magnesium 2.1 1.7 - 2.4 mg/dL  Phosphorus     Status: Abnormal   Collection Time: 05/21/18  6:44 AM  Result Value Ref Range   Phosphorus 2.1 (L) 2.5 - 4.6 mg/dL  CBC     Status: Abnormal   Collection Time: 05/21/18  6:44 AM  Result Value Ref  Range   WBC 18.0 (H) 4.0 - 10.5 K/uL   RBC 3.10 (L) 3.87 - 5.11 MIL/uL   Hemoglobin 8.3 (L) 12.0 - 15.0 g/dL   HCT 25.3 (L) 36.0 - 46.0 %   MCV 81.6 80.0 - 100.0 fL   MCH 26.8 26.0 - 34.0 pg   MCHC 32.8 30.0 - 36.0 g/dL   RDW 16.2 (H) 11.5 - 15.5 %   Platelets 450 (H) 150 - 400 K/uL   nRBC 0.2 0.0 - 0.2 %  Differential     Status: Abnormal   Collection Time: 05/21/18  6:44 AM  Result Value Ref Range   Neutrophils Relative % 71 %   Neutro Abs 12.7 (H) 1.7 - 7.7 K/uL   Lymphocytes Relative 17 %   Lymphs Abs 3.1 0.7 - 4.0 K/uL   Monocytes Relative 7 %   Monocytes Absolute 1.3 (H) 0.1 - 1.0 K/uL   Eosinophils Relative 2 %   Eosinophils Absolute 0.3 0.0 - 0.5 K/uL   Basophils Relative 0 %   Basophils Absolute 0.0 0.0 - 0.1 K/uL   Immature Granulocytes 3 %   Abs Immature Granulocytes 0.62 (H) 0.00 - 0.07 K/uL  Triglycerides     Status: None   Collection Time: 05/21/18  6:44 AM  Result Value Ref Range   Triglycerides 147 <150 mg/dL  Prealbumin     Status: Abnormal   Collection Time: 05/21/18  6:44 AM  Result Value Ref Range   Prealbumin 14.4 (L) 18 - 38 mg/dL  Glucose, capillary     Status: Abnormal   Collection Time: 05/21/18  8:01 AM  Result Value Ref Range   Glucose-Capillary 245 (H) 70 - 99 mg/dL  Glucose, capillary     Status: Abnormal   Collection Time: 05/21/18 11:23 AM  Result Value Ref Range   Glucose-Capillary 181 (H) 70 - 99 mg/dL   No results found.   Assessment/Plan: Diagnosis: debility after small bowel obstruction, ex-lap, multiple medical 1. Does the need for close, 24 hr/day medical supervision in concert with the patient's rehab needs make it unreasonable for this patient to be served in a less  intensive setting? Yes 2. Co-Morbidities requiring supervision/potential complications: HTN, DM, morbid obesity, ID considerations, wound care, pain mgt 3. Due to bladder management, bowel management, safety, skin/wound care, disease management, medication  administration, pain management and patient education, does the patient require 24 hr/day rehab nursing? Yes 4. Does the patient require coordinated care of a physician, rehab nurse, PT (1-2 hrs/day, 5 days/week), OT (1-2 hrs/day, 5 days/week) and SLP (1-2 hrs/day, 5 days/week) to address physical and functional deficits in the context of the above medical diagnosis(es)? Yes Addressing deficits in the following areas: balance, endurance, locomotion, strength, transferring, bowel/bladder control, bathing, dressing, feeding, grooming, swallowing and psychosocial support 5. Can the patient actively participate in an intensive therapy program of at least 3 hrs of therapy per day at least 5 days per week? Yes and Potentially 6. The potential for patient to make measurable gains while on inpatient rehab is excellent 7. Anticipated functional outcomes upon discharge from inpatient rehab are modified independent and supervision  with PT, modified independent, supervision and min assist with OT, modified independent with SLP. 8. Estimated rehab length of stay to reach the above functional goals is: 14-20 days 9. Anticipated D/C setting: Home 10. Anticipated post D/C treatments: HH therapy and Outpatient therapy 11. Overall Rehab/Functional Prognosis: excellent  RECOMMENDATIONS: This patient's condition is appropriate for continued rehabilitative care in the following setting: CIR Patient has agreed to participate in recommended program. Yes Note that insurance prior authorization may be required for reimbursement for recommended care.  Comment: Will follow along for improved activity tolerance and medical stability. Rehab Admissions Coordinator to follow up.  Thanks,  Meredith Staggers, MD, Mellody Drown  I have personally performed a face to face diagnostic evaluation of this patient. Additionally, I have reviewed and concur with the physician assistant's documentation above.    Lavon Paganini Angiulli,  PA-C 05/21/2018

## 2018-05-21 NOTE — Progress Notes (Signed)
Rehab Admissions Coordinator Note:  Patient was screened by Retta Diones for appropriateness for an Inpatient Acute Rehab Consult.  At this time, we are recommending Inpatient Rehab consult.  Jodell Cipro M 05/21/2018, 10:17 AM  I can be reached at [phone].

## 2018-05-21 NOTE — Progress Notes (Signed)
Progress Note: General Surgery Service   Assessment/Plan: Principal Problem:   Small bowel obstruction (Wendy [name]) Active Problems:   Essential (primary) hypertension   Extreme obesity   Uncontrolled type 2 diabetes mellitus with nonproliferative retinopathy ([facility])   Mild nonproliferative diabetic retinopathy of both eyes associated with type 2 diabetes mellitus (Wendy [name])   DKA (diabetic ketoacidoses) ([facility])   Acute respiratory failure with hypoxemia ([facility])  DKA- per medicine, improving  Sepsis- per primary SBO,Pneumoperitoneum, intussusception with necrosis & perforation - S/P ex lap, small bowel resection, closure with abthera vac, Dr. Grandville Silos, 01/17 -S/PExploratorylaparotomy with small bowel anastomosis and closure of abdominal fascia, Dr. Brantley Stage, 01/29 - cont NGT to LIWS2/2 high bilious NGT output  FEN:NPO, NGTto LIWS, TNA VTE: SCD's, start lovenox 01/30 VC:[id number]/46, Vanc 01/27; 01/31>>,Cefepime &Flagyl 01/27>>afebrile, blood cultures 01/29NGTD Foley:yes Follow up:TBD  DISPO:continue NGT to LIWS, TNA, ambulate/PT, appears deconditioned     LOS: 7 days  Chief Complaint/Subjective: Respiratory issues overnight, this morning talking well, some abdominal pain, no flatus or bowel movements  Objective: Vital signs in last 24 hours: Temp:  [98.1 F (36.7 C)-99.3 F (37.4 C)] 98.5 F (36.9 C) (02/03 0809) Pulse Rate:  [90-107] 94 (02/03 0800) Resp:  [19-32] 27 (02/03 0800) BP: (104-158)/(62-82) 145/79 (02/03 0800) SpO2:  [93 %-100 %] 97 % (02/03 0800) Weight:  [659 kg] 116 kg (02/03 0500) Last BM Date: (PTA)  Intake/Output from previous day: 02/02 0701 - 02/03 0700 In: 1936.3 [I.V.:1226.3; NG/GT:200; IV Piggyback:510] Out: 1155 [Urine:1005; Emesis/NG output:150] Intake/Output this shift: No intake/output data recorded.  Lungs: nonlabored  Cardiovascular: RR  Abd: soft, wound vac in place,   Extremities: trace edema  Neuro: answers  simple questions  Lab Results: CBC  Recent Labs    05/20/18 0334 05/20/18 0913 05/21/18 0644  WBC 13.4*  --  18.0*  HGB 8.3* 16.0* 8.3*  HCT 26.5* 47.0* 25.3*  PLT 423*  --  450*   BMET Recent Labs    05/20/18 0334 05/20/18 0913 05/20/18 2301 05/21/18 0644  NA 152* 151*  --  155*  K 3.4* 3.6 5.2* 3.7  CL 120*  --   --  121*  CO2 22  --   --  25  GLUCOSE 214*  --   --  301*  BUN 18  --   --  17  CREATININE 0.80  --   --  0.79  CALCIUM 8.3*  --   --  8.2*   PT/INR No results for input(s): LABPROT, INR in the last 72 hours. ABG Recent Labs    05/18/18 1535 05/20/18 0913  PHART 7.309* 7.465*  HCO3 21.0 22.3    Studies/Results:  Anti-infectives: Anti-infectives (From admission, onward)   Start     Dose/Rate Route Frequency Ordered Stop   05/19/18 1400  vancomycin (VANCOCIN) 1,250 mg in sodium chloride 0.9 % 250 mL IVPB  Status:  Discontinued     1,250 mg 166.7 mL/hr over 90 Minutes Intravenous Every 24 hours 05/18/18 1343 05/19/18 1244   05/19/18 1400  meropenem (MERREM) 2 g in sodium chloride 0.9 % 100 mL IVPB     2 g 200 mL/hr over 30 Minutes Intravenous Every 8 hours 05/19/18 1244 05/26/18 2359   05/18/18 1600  cefTRIAXone (ROCEPHIN) 2 g in sodium chloride 0.9 % 100 mL IVPB  Status:  Discontinued     2 g 200 mL/hr over 30 Minutes Intravenous Every 24 hours 05/18/18 1326 05/19/18 1244   05/18/18 1400  vancomycin (VANCOCIN) 2,000 mg in sodium  chloride 0.9 % 500 mL IVPB     2,000 mg 250 mL/hr over 120 Minutes Intravenous  Once 05/18/18 1343 05/18/18 1640   05/15/18 2333  ceFEPIme (MAXIPIME) 2 g in sodium chloride 0.9 % 100 mL IVPB  Status:  Discontinued     2 g 200 mL/hr over 30 Minutes Intravenous Every 8 hours 05/15/18 2226 05/18/18 1326   05/15/18 1500  ceFEPIme (MAXIPIME) 1 g in sodium chloride 0.9 % 100 mL IVPB  Status:  Discontinued     1 g 200 mL/hr over 30 Minutes Intravenous Every 24 hours 05/14/18 1512 05/15/18 2226   05/15/18 0000  anidulafungin  (ERAXIS) 100 mg in sodium chloride 0.9 % 100 mL IVPB  Status:  Discontinued     100 mg 78 mL/hr over 100 Minutes Intravenous Every 24 hours 05/14/18 1728 05/15/18 1159   05/14/18 1800  anidulafungin (ERAXIS) 200 mg in sodium chloride 0.9 % 200 mL IVPB     200 mg 78 mL/hr over 200 Minutes Intravenous  Once 05/14/18 1751 05/14/18 2138   05/14/18 1745  vancomycin (VANCOCIN) 2,000 mg in sodium chloride 0.9 % 500 mL IVPB  Status:  Discontinued     2,000 mg 250 mL/hr over 120 Minutes Intravenous  Once 05/14/18 1741 05/15/18 1159   05/14/18 1741  vancomycin variable dose per unstable renal function (pharmacist dosing)  Status:  Discontinued      Does not apply See admin instructions 05/14/18 1741 05/15/18 1159   05/14/18 1515  ceFEPIme (MAXIPIME) 2 g in sodium chloride 0.9 % 100 mL IVPB     2 g 200 mL/hr over 30 Minutes Intravenous  Once 05/14/18 1512 05/14/18 1649   05/14/18 1515  metroNIDAZOLE (FLAGYL) IVPB 500 mg  Status:  Discontinued     500 mg 100 mL/hr over 60 Minutes Intravenous Every 8 hours 05/14/18 1512 05/19/18 1244      Medications: Scheduled Meds: . chlorhexidine  15 mL Mouth Rinse BID  . Chlorhexidine Gluconate Cloth  6 each Topical Daily  . enoxaparin (LOVENOX) injection  40 mg Subcutaneous Q24H  . etomidate  20 mg Intravenous Once  . fentaNYL (SUBLIMAZE) injection  100 mcg Intravenous Once  . insulin aspart  0-20 Units Subcutaneous Q4H  . insulin glargine  15 Units Subcutaneous Daily  . mouth rinse  15 mL Mouth Rinse BID  . pantoprazole (PROTONIX) IV  40 mg Intravenous Q24H  . rocuronium  100 mg Intravenous Once  . sodium chloride flush  10-40 mL Intracatheter Q12H  . succinylcholine  100 mg Intravenous Once   Continuous Infusions: . sodium chloride Stopped (05/18/18 0858)  . fentaNYL infusion INTRAVENOUS Stopped (05/18/18 1538)  . meropenem (MERREM) IV 2 g (05/21/18 0503)  . potassium PHOSPHATE IVPB (in mmol)    . TPN ADULT (ION) 60 mL/hr at 05/20/18 1800   PRN  Meds:.fentaNYL, fentaNYL (SUBLIMAZE) injection, lip balm, sodium chloride flush  Mickeal Skinner, MD [facility]

## 2018-05-21 NOTE — Progress Notes (Signed)
VAST RN called and spoke with unit RN regarding IVT consult. Unit RN reported that night nurse stated brown port without blood return, but is currently infusing TPN. VAST RN advised that we can't disconnect TPN to instill Cath-Flo during TPN administration, but we can assess line and treat with Cath-Flo this afternoon before hanging new TPN. Unit RN verbalized understanding.

## 2018-05-21 NOTE — Evaluation (Signed)
Occupational Therapy Evaluation Patient Details Name: Wendy [name] MRN: [id number] DOB: March 07, 1961 Today's Date: 05/21/2018    History of Present Illness 58 year old female who presented with 3-day history of abdominal pain, emesis, anorexia, and polyuria for the past 3 days.  Labs notable hyperglycemia 771 with anion gap of 21.  CT abdomen pelvis showed small bowel obstruction with pneumoperitoneum and she underwent ex lap with small bowel resection.   Clinical Impression   PTA, pt was living with her husband and was independent. Pt currently requiring Mod A for UB ADLs, Max A for LB ADLs, and Mod A +2 for functional transfers. Pt presenting with decreased balance, strength, cognition, and activity tolerance. VSS throughout and SpO2 >90% on RA. Pt motivated to participate and husband very supportive. Pt will require further acute OT to increase occupational performance and facilitate safe dc. Recommend dc to CIR for intensive OT to optimize safety, independence with ADLs, and return to PLOF.    Follow Up Recommendations  CIR;Supervision/Assistance - 24 hour    Equipment Recommendations  Other (comment)(Defer to next venue)    Recommendations for Other Services Rehab consult;PT consult;Speech consult     Precautions / Restrictions Precautions Precautions: Fall;Other (comment) Precaution Comments: wound vac, NG tube, central line, foley Restrictions Weight Bearing Restrictions: No      Mobility Bed Mobility Overal bed mobility: Needs Assistance Bed Mobility: Supine to Sit     Supine to sit: Mod assist;+2 for physical assistance     General bed mobility comments: +rail, cues for sequencing, increased time and effort  Transfers Overall transfer level: Needs assistance Equipment used: None Transfers: Sit to/from [facility] Sit to Stand: Mod assist;+2 physical assistance Stand pivot transfers: +2 physical assistance;Min assist       General transfer comment:  cues for hand placement, assist to power up and stabilize balance, pivot steps with cues for sequencing bed to recliner    Balance Overall balance assessment: Needs assistance Sitting-balance support: Feet supported;Bilateral upper extremity supported Sitting balance-Leahy Scale: Fair     Standing balance support: Bilateral upper extremity supported;During functional activity Standing balance-Leahy Scale: Poor Standing balance comment: reliant on external support                           ADL either performed or assessed with clinical judgement   ADL Overall ADL's : Needs assistance/impaired Eating/Feeding: NPO   Grooming: Minimal assistance;Sitting   Upper Body Bathing: Moderate assistance;Sitting   Lower Body Bathing: Maximal assistance;Sit to/from stand   Upper Body Dressing : Moderate assistance;Sitting   Lower Body Dressing: Maximal assistance;Sit to/from stand Lower Body Dressing Details (indicate cue type and reason): Pt unable to bring ankles to knees or bend forward for donning socks. Max A required for donning socks Toilet Transfer: Minimal assistance;Moderate assistance;+2 for physical assistance;Stand-pivot(simulated to recliner) Toilet Transfer Details (indicate cue type and reason): Mod A +2 for power up into standing and then Min A +2 for balance in pivot towards recliner         Functional mobility during ADLs: Moderate assistance;+2 for physical assistance General ADL Comments: Pt presenting with decreased cognition, balance, strength, and activity tolerance. Pt requiring significant time to follow commands and answer questions.      Vision         Perception     Praxis      Pertinent Vitals/Pain Pain Assessment: Faces Faces Pain Scale: Hurts even more Pain Location: abdomen during mobility Pain Descriptors /  Indicators: Grimacing;Guarding Pain Intervention(s): Limited activity within patient's tolerance;Monitored during  session;Repositioned     Hand Dominance     Extremity/Trunk Assessment Upper Extremity Assessment Upper Extremity Assessment: Generalized weakness(Edema)   Lower Extremity Assessment Lower Extremity Assessment: Defer to PT evaluation   Cervical / Trunk Assessment Cervical / Trunk Assessment: Normal   Communication Communication Communication: No difficulties(Soft spoken)   Cognition Arousal/Alertness: Awake/alert Behavior During Therapy: Flat affect Overall Cognitive Status: Impaired/Different from baseline Area of Impairment: Following commands;Problem solving;Attention;Orientation;Awareness                 Orientation Level: Disoriented to;Situation Current Attention Level: Selective   Following Commands: Follows one step commands with increased time   Awareness: Emergent Problem Solving: Slow processing;Decreased initiation;Difficulty sequencing;Requires verbal cues General Comments: slow to respond. Difficulty with word finding. Pt reports she feels like she is "slower" and husband confirms this is not baseline cognition   General Comments  VSS throughout. >90s on RA    Exercises     Shoulder Instructions      Home Living Family/patient expects to be discharged to:: Private residence Living Arrangements: Spouse/significant other Available Help at Discharge: Family;Available 24 hours/day Type of Home: House Home Access: Level entry     Home Layout: Two level;1/2 bath on main level;Bed/bath upstairs Alternate Level Stairs-Number of Steps: flight Alternate Level Stairs-Rails: Right;Left Bathroom Shower/Tub: Teacher, early years/pre: Standard     Home Equipment: None          Prior Functioning/Environment Level of Independence: Independent                 OT Problem List: Decreased strength;Decreased range of motion;Decreased activity tolerance;Impaired balance (sitting and/or standing);Decreased safety awareness;Decreased  knowledge of use of DME or AE;Decreased knowledge of precautions;Decreased cognition;Impaired UE functional use;Pain;Increased edema      OT Treatment/Interventions: Self-care/ADL training;Therapeutic exercise;Energy conservation;DME and/or AE instruction;Therapeutic activities;Patient/family education    OT Goals(Current goals can be found in the care plan section) Acute Rehab OT Goals Patient Stated Goal: Return to independence OT Goal Formulation: With patient/family Time For Goal Achievement: 06/04/18 Potential to Achieve Goals: Good  OT Frequency: Min 2X/week   Barriers to D/C:            Co-evaluation PT/OT/SLP Co-Evaluation/Treatment: Yes Reason for Co-Treatment: Complexity of the patient's impairments (multi-system involvement);To address functional/ADL transfers   OT goals addressed during session: ADL's and self-care      AM-PAC OT "6 Clicks" Daily Activity     Outcome Measure Help from another person eating meals?: Total Help from another person taking care of personal grooming?: A Little Help from another person toileting, which includes using toliet, bedpan, or urinal?: A Lot Help from another person bathing (including washing, rinsing, drying)?: A Lot Help from another person to put on and taking off regular upper body clothing?: A Lot Help from another person to put on and taking off regular lower body clothing?: A Lot 6 Click Score: 12   End of Session Nurse Communication: Mobility status  Activity Tolerance: Patient tolerated treatment well;Patient limited by fatigue Patient left: in chair;with call bell/phone within reach;with chair alarm set;with family/visitor present  OT Visit Diagnosis: Unsteadiness on feet (R26.81);Other abnormalities of gait and mobility (R26.89);Muscle weakness (generalized) (M62.81);Other symptoms and signs involving cognitive function;Pain Pain - part of body: ([place])                Time: 0973-5329 OT Time Calculation (min): 26  min Charges:  OT  General Charges $OT Visit: 1 Visit OT Evaluation $OT Eval Moderate Complexity: [place], OTR/L Acute Rehab Pager: [phone] Office: [place] 05/21/2018, 9:51 AM

## 2018-05-21 NOTE — Progress Notes (Addendum)
NAME:  Wendy [name], MRN:  [id number], DOB:  05/23/1960, LOS: 7 ADMISSION DATE:  05/14/2018, CONSULTATION DATE: 05/14/2018 REFERRING MD:  Dr. Wilson Singer, CHIEF COMPLAINT:  Abd pain  Brief History   58 year old female who presented with 3-day history of abdominal pain, emesis, anorexia, and polyuria for the past 3 days.  Labs notable hyperglycemia 771 with anion gap of 21.  CT abdomen pelvis showed small bowel obstruction with pneumoperitoneum and she underwent ex lap with small bowel resection.   Past Medical History   Past Medical History:  Diagnosis Date  . Acute eczema   . Allergy   . Anemia   . Anxiety   . Chronic constipation with overflow   . Diabetes mellitus without complication ([place])   . GERD (gastroesophageal reflux disease)   . Herpetic vesicle in vagina   . Hyperlipidemia   . Hypertension   . Leg edema   . Morbid obesity ([place])   . Peri-menopause   . Snoring      Significant Hospital Events   1/27 > Admitted to hospital > OR > ICU 1/27 > OR for ex lap with small bowel resection 1/29 > OR for anastomosis and closure 2/1> Extubated 2/2> Hypoactive delirium, worsening respiratory status  Consults:  Surgery  Procedures:  1/27 > OR for ex lap with small bowel resection 1/29 > OR for anastomosis and closure 2/1> Extubated  Significant Diagnostic Tests:  1/27 CT abd/pelvis > SBO with pneumoperitoneum  Micro Data:  1/27 blood culture> Gram variable rod 1/27 urine culture> No growth 1/27 respiratory culture>   1/27 peritoneal culture> E. Faceium 1/27 MRSA> negative 1/29 Blood cultures > NGTD  Antimicrobials:  Metronidazole 1/27> 2/1 Cefepime 1/27> 2/1 Anidulafungin 1/27 > 1/28  Meropenem 2/1  Interim history/subjective:  NAEO Decreased Oxygen requirement this morning to [facility]  Objective   Blood pressure (!) 145/79, pulse 94, temperature 98.5 F (36.9 C), temperature source Axillary, resp. rate (!) 27, height 5\' 3"  (1.6 m), weight 116 kg, last menstrual  period 01/26/2017, SpO2 97 %.        Intake/Output Summary (Last 24 hours) at 05/21/2018 1003 Last data filed at 05/21/2018 0700 Gross per 24 hour  Intake 1816.25 ml  Output 1035 ml  Net 781.25 ml   Filed Weights   05/19/18 0452 05/20/18 0500 05/21/18 0500  Weight: 116.1 kg 114.5 kg 116 kg   Physical Exam: General: WDWN adult female, reclined in bed NAD HENT: NCAT, anicteric sclera, trachea midline Respiratory: 2LNC. RUL rhonchi. No crackles.  Cardiovascular: RRR no r/g/m. 2+ radial pulses 1+ pedal pulses GI: Soft, non-distended, hypoactive x4 Extremities: symmetrical bulk and tone, no pedal edema, capillary refill < 3 seconds Neuro: AAOx4 following commands Skin: clean dry warm, no rashes Psych: conversant, pleasant  Resolved Hospital Problem list   Septic shock secondary to peritonits DKA Post-op fever AKI  Assessment & Plan:   Acute hypoxemic respiratory failure Intubated for surgery. Extubated 2/1. P - OOB to char - IS q2 hours -CPT q4 hours -Welcome oxygen for SpO2>94%, wean as able   Acute metabolic encephalopathy Hypoactive delirium CT head negative P - Minimize sedatives - PRN tylenol for pain control -ICU delirium precautions  Small bowel obstruction with perforation s/p resection and anastomosis and wound closure Ileus - Management per Surgery: LIWS, TPN - Wound vac per surgery  Septic shock secondary to enterococcus faecium peritonitis in setting of recent surgery and ?bacteremia Shock has resolved. S/p Cefepime and flagyl x 6 d BCx 1/27 +  For  gram variable rod. BCx 1/29 NGTD P - F/u final cultures - Based on OR and blood CX abx broadened to mero -Continue 14 day course of Meropenem, to end 2/8  Hypernatremia P Holding TPN 2/3 for hypernatremia Pharmacy to adjust formula for future use  Recheck Na this afternoon  Best practice:  Diet: TPN Pain/Anxiety/Delirium protocol (if indicated): Fentanyl VAP protocol (if indicated): yes DVT  prophylaxis: SCDs GI prophylaxis: PPI Glucose control: Lantus and SSI Mobility: Bedrest Code Status: Full Family Communication: Husband at bedside, updated RE TPN and mobility  Disposition: Remain in ICU  Labs   CBC: Recent Labs  Lab 05/14/18 1133  05/14/18 2014  05/17/18 0314 05/18/18 0759 05/18/18 1535 05/19/18 0521 05/20/18 0334 05/20/18 0913 05/21/18 0644  WBC 22.5*  --  14.8*   < > 18.0* 16.6*  --  13.4* 13.4*  --  18.0*  NEUTROABS 19.1*  --  12.0*  --   --   --   --   --  8.5*  --  12.7*  HGB 13.2   < > 11.4*   < > 10.0* 8.4* 8.5* 9.0* 8.3* 16.0* 8.3*  HCT 41.7   < > 35.5*   < > 31.8* 25.4* 25.0* 28.0* 26.5* 47.0* 25.3*  MCV 81.9  --  82.8   < > 82.4 79.9*  --  82.8 81.5  --  81.6  PLT 370  --  256   < > 258 293  --  368 423*  --  450*   < > = values in this interval not displayed.    Basic Metabolic Panel: Recent Labs  Lab 05/14/18 2014  05/15/18 0328  05/17/18 9528 05/18/18 0421 05/18/18 1535 05/19/18 0521 05/20/18 0334 05/20/18 0913 05/20/18 2301 05/21/18 0644  NA 137   < > 140   < > 143 149* 150* 150* 152* 151*  --  155*  K 3.4*   < > 4.1   < > 3.5 3.4* 3.4* 3.6 3.4* 3.6 5.2* 3.7  CL 106   < > 113*   < > 114* 119*  --  119* 120*  --   --  121*  CO2 19*   < > 21*   < > 21* 22  --  19* 22  --   --  25  GLUCOSE 339*   < > 139*   < > 139* 144*  --  107* 214*  --   --  301*  BUN 31*   < > 27*   < > 15 20  --  20 18  --   --  17  CREATININE 1.59*   < > 1.39*   < > 1.00 1.12*  --  1.09* 0.80  --   --  0.79  CALCIUM 8.9   < > 8.6*   < > 7.9* 8.0*  --  8.4* 8.3*  --   --  8.2*  MG 1.8  --  1.9  --   --   --   --  2.3 2.2  --   --  2.1  PHOS 2.5  --  2.3*  --   --   --   --  2.0* 1.3*  --  2.1* 2.1*   < > = values in this interval not displayed.   GFR: Estimated Creatinine Clearance: 94.1 mL/min (by C-G formula based on SCr of 0.79 mg/dL). Recent Labs  Lab 05/14/18 1648 05/14/18 2014 05/14/18 2047 05/14/18 2223  05/18/18 4132 05/19/18 4401  05/20/18 0334 05/21/18 0644  PROCALCITON  --  25.12  --   --   --   --   --   --   --   WBC  --  14.8*  --   --    < > 16.6* 13.4* 13.4* 18.0*  LATICACIDVEN 5.2*  --  1.2 1.3  --   --   --   --   --    < > = values in this interval not displayed.    Liver Function Tests: Recent Labs  Lab 05/14/18 2014 05/20/18 0334 05/21/18 0644  AST 49* 12* 14*  ALT 29 21 17   ALKPHOS 78 51 50  BILITOT 1.4* 0.2* 0.4  PROT 5.1* 5.0* 5.0*  ALBUMIN 2.7* 1.8* 1.8*   Recent Labs  Lab 05/14/18 2014  LIPASE 37  AMYLASE 60   No results for input(s): AMMONIA in the last 168 hours.  ABG    Component Value Date/Time   PHART 7.465 (H) 05/20/2018 0913   PCO2ART 31.0 (L) 05/20/2018 0913   PO2ART 44.0 (L) 05/20/2018 0913   HCO3 22.3 05/20/2018 0913   TCO2 23 05/20/2018 0913   ACIDBASEDEF 5.0 (H) 05/18/2018 1535   O2SAT 83.0 05/20/2018 0913     Coagulation Profile: Recent Labs  Lab 05/14/18 2014  INR 1.44    Cardiac Enzymes: No results for input(s): CKTOTAL, CKMB, CKMBINDEX, TROPONINI in the last 168 hours.  HbA1C: HbA1c, POC (controlled diabetic range)  Date/Time Value Ref Range Status  02/06/2018 05:16 PM 8.2 (A) 0.0 - 7.0 % Final  11/06/2017 12:15 PM 9.7 (A) 0.0 - 7.0 % Final    CBG: Recent Labs  Lab 05/20/18 1645 05/20/18 2051 05/20/18 2354 05/21/18 0438 05/21/18 0801  GLUCAP 206* 255* 219* 277* 245*    Critical Care Time 35 minutes    Eliseo Gum MSN, AGACNP-BC [place] [facility] 05/21/2018, 10:04 AM

## 2018-05-21 NOTE — Progress Notes (Signed)
PHARMACY - ADULT TOTAL PARENTERAL NUTRITION CONSULT NOTE   Pharmacy Consult for TPN Indication: ileus  Patient Measurements: Height: 5\' 3"  (160 cm) Weight: 255 lb 11.7 oz (116 kg) IBW/kg (Calculated) : 52.4 TPN AdjBW (KG): 68.8 Body mass index is 45.3 kg/m. Usual Weight: 117.9 kg  Assessment:  58 y/o female presenting to the ED with 3 days of vomiting and abdominal pain found to be in DKA. CT showed SBO. Noted SBO, pneumoperitoneum, intussusception with necrosis and performation and was taken to the OR emergently for ex lap, SBR left in discontinuity, and abthera VAC on 1/27. Taken back to the OR on 1/29 for ex lap with SB anastomosis and fascia closure. High risk for refeeding.  Husband reports patient eating well prior to experiencing GI distress.  GI: prealbumin improving 10.1>14.4, 150 ml NGT output, PPI IV Endo: hx DM, CBGs >200 on resistant SSI, Lantus 15 units/d and 7 units insulin in TPN, also KPhos in dextrose Insulin requirements in the past 24 hours: 55 units SSI  Lytes: Na up 151 > 155 but remains alert and oriented, K low 3.7 (goal >= 4), Mag 2.1 (goal >=2), Phos 2.1 - replacing 4-6 L free water deficit  Renal: AKI resolved, SCr 0.8, UOP 0.3 ml/kg/hr, net +0.9L Pulm: extubated Cards: MAP 80-90s, tachy Hepatobil: LFT and T bili WNL, TG 158 Neuro: awake but drowsy ID: Abx [id number], meropenem for IAI, Tm 99.1, WBC 13.4  TPN Access: double lumen CVC placed 05/14/18 TPN start date: 05/19/18  Nutritional Goals (per RD recommendation on 05/19/18): KCal: 2100-2300 Protein: 116-128 g Fluid: >1.6L  Goal TPN rate is 83 ml/hr (provides 2171 kcal, 120 g protein)  Current Nutrition:  TPN NPO  Plan:  TPN stopped this AM given hypernatremia and inability to start free water given hyperglycemia  Resume TPN at 1800 today at 60 mL/hr. Will not titrate to goal until electrolytes have stabilized  Hold 20% lipid emulsion for first 7 days for ICU patients per ASPEN guidelines. Will  initiate lipids tomorrow  This TPN provides 86 g of protein (Clinisol 15%), 360 g of dextrose, and no lipids which provides 1554 kCals per day, meeting 71% of patient needs Electrolytes in TPN: Remove Na, Increase phosphate and K, Cl:Ac 1:2 Add MVI, trace elements to TPN On resistant SSI and Lantus 15 daily. Will also add 20 units insulin to TPN Monitor TPN labs KPhos 20 mmol IV  Monitor sodium closely with no sodium in bag. Recheck Na at 1400 today    Albertina Parr, PharmD., BCPS Clinical Pharmacist Clinical phone for 05/21/18 until 3:30pm: [phone] If after 3:30pm, please refer to [facility] for unit-specific pharmacist

## 2018-05-21 NOTE — Evaluation (Signed)
Physical Therapy Evaluation Patient Details Name: Wendy [name] MRN: [id number] DOB: [birth year] Today's Date: 05/21/2018   History of Present Illness  58 year old female who presented with 3-day history of abdominal pain, emesis, anorexia, and polyuria for the past 3 days.  Labs notable hyperglycemia 771 with anion gap of 21.  CT abdomen pelvis showed small bowel obstruction with pneumoperitoneum and she underwent ex lap with small bowel resection.    Clinical Impression  Pt admitted with above diagnosis. Pt currently with functional limitations due to the deficits listed below (see PT Problem List). PTA pt lived at home with her husband, independent. On eval she required +2 mod assist bed mobility and +2 mod assist transfers. Pt demonstrates deficits in cognition, activity tolerance, strength and balance. Pt will benefit from skilled PT to increase their independence and safety with mobility to allow discharge to the venue listed below.       Follow Up Recommendations CIR    Equipment Recommendations  Other (comment)(TBA)    Recommendations for Other Services       Precautions / Restrictions Precautions Precautions: Fall;Other (comment) Precaution Comments: wound vac, NG tube, central line, foley Restrictions Weight Bearing Restrictions: No      Mobility  Bed Mobility Overal bed mobility: Needs Assistance Bed Mobility: Supine to Sit     Supine to sit: Mod assist;+2 for physical assistance     General bed mobility comments: +rail, cues for sequencing, increased time and effort  Transfers Overall transfer level: Needs assistance Equipment used: None Transfers: Sit to/from [facility] Sit to Stand: Mod assist;+2 physical assistance Stand pivot transfers: +2 physical assistance;Min assist       General transfer comment: cues for hand placement, assist to power up and stabilize balance, pivot steps with cues for sequencing bed to recliner  Ambulation/Gait              General Gait Details: unable due to fatigue/weakness  Stairs            Wheelchair Mobility    Modified Rankin (Stroke Patients Only)       Balance Overall balance assessment: Needs assistance Sitting-balance support: Feet supported;Bilateral upper extremity supported Sitting balance-Leahy Scale: Fair     Standing balance support: Bilateral upper extremity supported;During functional activity Standing balance-Leahy Scale: Poor Standing balance comment: reliant on external support                             Pertinent Vitals/Pain Pain Assessment: Faces Faces Pain Scale: Hurts even more Pain Location: abdomen during mobility Pain Descriptors / Indicators: Grimacing;Guarding Pain Intervention(s): Limited activity within patient's tolerance;Monitored during session;Repositioned    Home Living Family/patient expects to be discharged to:: Private residence Living Arrangements: Spouse/significant other Available Help at Discharge: Family;Available 24 hours/day Type of Home: House Home Access: Level entry     Home Layout: Two level;1/2 bath on main level;Bed/bath upstairs Home Equipment: None      Prior Function Level of Independence: Independent               Hand Dominance        Extremity/Trunk Assessment   Upper Extremity Assessment Upper Extremity Assessment: Defer to OT evaluation    Lower Extremity Assessment Lower Extremity Assessment: Generalized weakness    Cervical / Trunk Assessment Cervical / Trunk Assessment: Normal  Communication   Communication: No difficulties  Cognition Arousal/Alertness: Awake/alert Behavior During Therapy: Flat affect Overall Cognitive Status: Impaired/Different from baseline  Area of Impairment: Following commands;Problem solving;Attention;Orientation;Awareness                 Orientation Level: Disoriented to;Situation Current Attention Level: Selective   Following  Commands: Follows one step commands with increased time   Awareness: Emergent Problem Solving: Slow processing;Decreased initiation;Difficulty sequencing;Requires verbal cues General Comments: slow to respond. Difficulty with word finding.      General Comments      Exercises     Assessment/Plan    PT Assessment Patient needs continued PT services  PT Problem List Decreased strength;Decreased balance;Pain;Decreased mobility;Decreased activity tolerance;Obesity;Cardiopulmonary status limiting activity;Decreased cognition       PT Treatment Interventions DME instruction;Functional mobility training;Balance training;Patient/family education;Gait training;Therapeutic activities;Therapeutic exercise;Stair training;Cognitive remediation;Neuromuscular re-education    PT Goals (Current goals can be found in the Care Plan section)  Acute Rehab PT Goals Patient Stated Goal: home PT Goal Formulation: With patient/family Time For Goal Achievement: 06/04/18 Potential to Achieve Goals: Good    Frequency Min 3X/week   Barriers to discharge        Co-evaluation PT/OT/SLP Co-Evaluation/Treatment: Yes Reason for Co-Treatment: Complexity of the patient's impairments (multi-system involvement);To address functional/ADL transfers           AM-PAC PT "6 Clicks" Mobility  Outcome Measure Help needed turning from your back to your side while in a flat bed without using bedrails?: A Lot Help needed moving from lying on your back to sitting on the side of a flat bed without using bedrails?: A Lot Help needed moving to and from a bed to a chair (including a wheelchair)?: A Lot Help needed standing up from a chair using your arms (e.g., wheelchair or bedside chair)?: A Lot Help needed to walk in hospital room?: A Lot Help needed climbing 3-5 steps with a railing? : Total 6 Click Score: 11    End of Session Equipment Utilized During Treatment: Oxygen Activity Tolerance: Patient tolerated  treatment well Patient left: in chair;with call bell/phone within reach;with family/visitor present;with chair alarm set Nurse Communication: Mobility status PT Visit Diagnosis: Muscle weakness (generalized) (M62.81);Difficulty in walking, not elsewhere classified (R26.2);Pain    Time: 3299-2426 PT Time Calculation (min) (ACUTE ONLY): 26 min   Charges:   PT Evaluation $PT Eval Moderate Complexity: 1 Mod          Lorrin Goodell, PT  Office # [phone] Pager [phone]   Lorriane Shire 05/21/2018, 9:32 AM

## 2018-05-22 LAB — BASIC METABOLIC PANEL
Anion gap: 6 (ref 5–15)
BUN: 14 mg/dL (ref 6–20)
CO2: 29 mmol/L (ref 22–32)
Calcium: 8.3 mg/dL — ABNORMAL LOW (ref 8.9–10.3)
Chloride: 118 mmol/L — ABNORMAL HIGH (ref 98–111)
Creatinine, Ser: 0.77 mg/dL (ref 0.44–1.00)
GFR calc Af Amer: >60 mL/min (ref >60)
GFR calc non Af Amer: >60 mL/min (ref >60)
Glucose, Bld: 238 mg/dL — ABNORMAL HIGH (ref 70–99)
Potassium: 3.6 mmol/L (ref 3.5–5.1)
Sodium: 153 mmol/L — ABNORMAL HIGH (ref 135–145)

## 2018-05-22 LAB — CULTURE, BLOOD (ROUTINE X 2): Special Requests: ADEQUATE

## 2018-05-22 LAB — GLUCOSE, CAPILLARY
Glucose-Capillary: 194 mg/dL — ABNORMAL HIGH (ref 70–99)
Glucose-Capillary: 196 mg/dL — ABNORMAL HIGH (ref 70–99)
Glucose-Capillary: 196 mg/dL — ABNORMAL HIGH (ref 70–99)
Glucose-Capillary: 199 mg/dL — ABNORMAL HIGH (ref 70–99)
Glucose-Capillary: 210 mg/dL — ABNORMAL HIGH (ref 70–99)
Glucose-Capillary: 220 mg/dL — ABNORMAL HIGH (ref 70–99)
Glucose-Capillary: 230 mg/dL — ABNORMAL HIGH (ref 70–99)

## 2018-05-22 LAB — CBC
HCT: 33 % — ABNORMAL LOW (ref 36.0–46.0)
Hemoglobin: 10.8 g/dL — ABNORMAL LOW (ref 12.0–15.0)
MCH: 26.4 pg (ref 26.0–34.0)
MCHC: 32.7 g/dL (ref 30.0–36.0)
MCV: 80.7 fL (ref 80.0–100.0)
PLATELETS: 259 10*3/uL (ref 150–400)
RBC: 4.09 MIL/uL (ref 3.87–5.11)
RDW: 16 % — ABNORMAL HIGH (ref 11.5–15.5)
WBC: 17 10*3/uL — ABNORMAL HIGH (ref 4.0–10.5)
nRBC: 0.5 % — ABNORMAL HIGH (ref 0.0–0.2)

## 2018-05-22 LAB — PHOSPHORUS: Phosphorus: 2.9 mg/dL (ref 2.5–4.6)

## 2018-05-22 LAB — MAGNESIUM: MAGNESIUM: 2 mg/dL (ref 1.7–2.4)

## 2018-05-22 MED ORDER — ACETAMINOPHEN 650 MG RE SUPP: 650.0000 mg | RECTAL | Status: DC | PRN

## 2018-05-22 MED ORDER — POTASSIUM CHLORIDE 10 MEQ/50ML IV SOLN
10.0000 meq | INTRAVENOUS | Status: AC
  Administered 2018-05-22 (×3): 10 meq via INTRAVENOUS
  Filled 2018-05-22: qty 50

## 2018-05-22 MED ORDER — INSULIN GLARGINE 100 UNIT/ML ~~LOC~~ SOLN
24.0000 [IU] | Freq: Every day | SUBCUTANEOUS | Status: DC
  Administered 2018-05-23: 24 [IU] via SUBCUTANEOUS
  Administered 2018-05-24: 18 [IU] via SUBCUTANEOUS
  Administered 2018-05-25 – 2018-06-04 (×11): 24 [IU] via SUBCUTANEOUS
  Filled 2018-05-22 (×13): qty 0.24

## 2018-05-22 MED ORDER — FENTANYL CITRATE (PF) 100 MCG/2ML IJ SOLN
12.5000 ug | INTRAMUSCULAR | Status: DC | PRN
  Filled 2018-05-22: qty 2

## 2018-05-22 MED ORDER — TRAVASOL 10 % IV SOLN
INTRAVENOUS | Status: AC
  Administered 2018-05-22: 18:00:00 via INTRAVENOUS
  Filled 2018-05-22: qty 1274.4

## 2018-05-22 MED ORDER — FLUTICASONE PROPIONATE 50 MCG/ACT NA SUSP
2.0000 | Freq: Every day | NASAL | Status: DC
  Administered 2018-05-24 – 2018-06-04 (×10): 2 via NASAL
  Filled 2018-05-22 (×2): qty 16

## 2018-05-22 NOTE — Progress Notes (Signed)
[facility]/Trauma Progress Note  6 Days Post-Op   Assessment/Plan  DKA- per medicine, improving  Sepsis- per primary SBO,Pneumoperitoneum, intussusception with necrosis & perforation - S/P ex lap, small bowel resection, closure with abthera vac, Dr. Grandville Silos, 01/17 -S/PExploratorylaparotomy with small bowel anastomosis and closure of abdominal fascia, Dr. Brantley Stage, 01/29 - cont NGT to LIWS and await return of bowel function  FEN:NPO, NGTto LIWS, TNA VTE: SCD's, start lovenox 01/30 WI:[id number], Vanc 01/27; 01/31>>,Cefepime &Flagyl 01/27>>afebrile, blood cultures 01/29NGTD, peritoneal fluid cultures showed Enterococcus and Clostridium, WBC 18 on 02/03, AM CBC pending  Foley:yes Follow up:TBD  DISPO:continue NGT to LIWS,TNA, ambulate/PT, appears deconditioned, CBC pending to trend WBC   LOS: 8 days    Subjective: CC: abdominal pain  Pt is lethargic. She will wake. Abdominal pain with palpation. No flatus or BM. Husband at bedside.   Objective: Vital signs in last 24 hours: Temp:  [98.4 F (36.9 C)-99.8 F (37.7 C)] 99.8 F (37.7 C) (02/04 0018) Pulse Rate:  [76-105] 82 (02/04 0500) Resp:  [16-27] 17 (02/04 0500) BP: (127-161)/(70-98) 150/77 (02/04 0500) SpO2:  [96 %-100 %] 100 % (02/04 0500) FiO2 (%):  [36 %] 36 % (02/03 1156) Weight:  [116.3 kg] 116.3 kg (02/04 0454) Last BM Date: (PTA)  Intake/Output from previous day: 02/03 0701 - 02/04 0700 In: 1682.8 [I.V.:767.7; IV Piggyback:915.1] Out: 1380 [Urine:780; Emesis/NG output:550; Drains:50] Intake/Output this shift: No intake/output data recorded.  PE: Gen: Alert, NAD, sleepy Pulm:rate and effort normal Abd: Soft,ND, vac in place, generalized TTP worse on right with guarding, no BS appreciated, vac in place of midline, NGT output bilious Skin: no rashes noted, warm and dry  Anti-infectives: Anti-infectives (From admission, onward)   Start     Dose/Rate Route  Frequency Ordered Stop   05/19/18 1400  vancomycin (VANCOCIN) 1,250 mg in sodium chloride 0.9 % 250 mL IVPB  Status:  Discontinued     1,250 mg 166.7 mL/hr over 90 Minutes Intravenous Every 24 hours 05/18/18 1343 05/19/18 1244   05/19/18 1400  meropenem (MERREM) 2 g in sodium chloride 0.9 % 100 mL IVPB     2 g 200 mL/hr over 30 Minutes Intravenous Every 8 hours 05/19/18 1244 05/26/18 2359   05/18/18 1600  cefTRIAXone (ROCEPHIN) 2 g in sodium chloride 0.9 % 100 mL IVPB  Status:  Discontinued     2 g 200 mL/hr over 30 Minutes Intravenous Every 24 hours 05/18/18 1326 05/19/18 1244   05/18/18 1400  vancomycin (VANCOCIN) 2,000 mg in sodium chloride 0.9 % 500 mL IVPB     2,000 mg 250 mL/hr over 120 Minutes Intravenous  Once 05/18/18 1343 05/18/18 1640   05/15/18 2333  ceFEPIme (MAXIPIME) 2 g in sodium chloride 0.9 % 100 mL IVPB  Status:  Discontinued     2 g 200 mL/hr over 30 Minutes Intravenous Every 8 hours 05/15/18 2226 05/18/18 1326   05/15/18 1500  ceFEPIme (MAXIPIME) 1 g in sodium chloride 0.9 % 100 mL IVPB  Status:  Discontinued     1 g 200 mL/hr over 30 Minutes Intravenous Every 24 hours 05/14/18 1512 05/15/18 2226   05/15/18 0000  anidulafungin (ERAXIS) 100 mg in sodium chloride 0.9 % 100 mL IVPB  Status:  Discontinued     100 mg 78 mL/hr over 100 Minutes Intravenous Every 24 hours 05/14/18 1728 05/15/18 1159   05/14/18 1800  anidulafungin (ERAXIS) 200 mg in sodium chloride 0.9 % 200 mL IVPB     200 mg 78 mL/hr  over 200 Minutes Intravenous  Once 05/14/18 1751 05/14/18 2138   05/14/18 1745  vancomycin (VANCOCIN) 2,000 mg in sodium chloride 0.9 % 500 mL IVPB  Status:  Discontinued     2,000 mg 250 mL/hr over 120 Minutes Intravenous  Once 05/14/18 1741 05/15/18 1159   05/14/18 1741  vancomycin variable dose per unstable renal function (pharmacist dosing)  Status:  Discontinued      Does not apply See admin instructions 05/14/18 1741 05/15/18 1159   05/14/18 1515  ceFEPIme (MAXIPIME) 2 g  in sodium chloride 0.9 % 100 mL IVPB     2 g 200 mL/hr over 30 Minutes Intravenous  Once 05/14/18 1512 05/14/18 1649   05/14/18 1515  metroNIDAZOLE (FLAGYL) IVPB 500 mg  Status:  Discontinued     500 mg 100 mL/hr over 60 Minutes Intravenous Every 8 hours 05/14/18 1512 05/19/18 1244      Lab Results:  Recent Labs    05/20/18 0334 05/20/18 0913 05/21/18 0644  WBC 13.4*  --  18.0*  HGB 8.3* 16.0* 8.3*  HCT 26.5* 47.0* 25.3*  PLT 423*  --  450*   BMET Recent Labs    05/21/18 1453 05/21/18 2053  NA 155* 152*  K 3.7 4.5  CL 120* 119*  CO2 25 23  GLUCOSE 149* 241*  BUN 15 16  CREATININE 0.74 0.78  CALCIUM 8.5* 8.4*   PT/INR No results for input(s): LABPROT, INR in the last 72 hours. CMP     Component Value Date/Time   NA 152 (H) 05/21/2018 2053   NA 144 12/25/2014 1536   K 4.5 05/21/2018 2053   CL 119 (H) 05/21/2018 2053   CO2 23 05/21/2018 2053   GLUCOSE 241 (H) 05/21/2018 2053   BUN 16 05/21/2018 2053   BUN 9 12/25/2014 1536   CREATININE 0.78 05/21/2018 2053   CREATININE 0.69 05/25/2017 1150   CALCIUM 8.4 (L) 05/21/2018 2053   PROT 5.7 (L) 05/21/2018 1453   PROT 6.7 12/25/2014 1536   ALBUMIN 2.0 (L) 05/21/2018 1453   ALBUMIN 4.4 12/25/2014 1536   AST 20 05/21/2018 1453   ALT 18 05/21/2018 1453   ALKPHOS 58 05/21/2018 1453   BILITOT 0.3 05/21/2018 1453   BILITOT 0.3 12/25/2014 1536   GFRNONAA >60 05/21/2018 2053   GFRNONAA 78 08/25/2016 1100   GFRAA >60 05/21/2018 2053   GFRAA >89 08/25/2016 1100   Lipase     Component Value Date/Time   LIPASE 37 05/14/2018 2014    Studies/Results: No results found.    Kalman Drape , [facility] 05/22/2018, 7:33 AM  Pager: [phone] Mon-Wed, Friday 7:00am-4:30pm Thurs 7am-11:30am  Consults: [phone]

## 2018-05-22 NOTE — Progress Notes (Addendum)
[place] CONSULT NOTE   Pharmacy Consult for TPN Indication: ileus  Patient Measurements: Height: 5\' 3"  (160 cm) Weight: 256 lb 6.3 oz (116.3 kg) IBW/kg (Calculated) : 52.4 TPN AdjBW (KG): 68.8 Body mass index is 45.42 kg/m. Usual Weight: 117.9 kg  Assessment:  58 y/o female presenting to the ED with 3 days of vomiting and abdominal pain found to be in DKA. CT showed SBO. Noted SBO, pneumoperitoneum, intussusception with necrosis and performation and was taken to the OR emergently for ex lap, SBR left in discontinuity, and abthera VAC on 1/27. Taken back to the OR on 1/29 for ex lap with SB anastomosis and fascia closure. High risk for refeeding.  Husband reports patient eating well prior to experiencing GI distress.  GI: SBO, pneumoperitoneum, intussusception with necrosis and perforation. Albumin 2.0, prealbumin improving 10.1>14.4, NG output up to 550 ml, PPI IV Endo: hx DM, CBGs in mid 200s with new TPN Insulin requirements in the past 24 hours: 26 units SSI, 20 units in TPN, and 15 units of Lantus Lytes: Na down a little to 153, other lytes wnl. K down to 3.6 (goal >= 4), Mag 2.0 (goal >=2), Phos 2.9. Cl elevated at 118. 4-6 L free water deficit  Renal: AKI resolved, SCr stable, UOP 0.3 ml/kg/hr, net +0.9L Pulm: extubated Cards: MAP 80-90s, tachy Hepatobil: LFT and T bili WNL, TG 147 Neuro: awake but drowsy ID: Abx [id number], meropenem for IAI, Tm 99.1, WBC 13.4  TPN Access: double lumen CVC placed 05/14/18 TPN start date: 05/19/18  Nutritional Goals (per RD recommendation on 05/19/18): KCal: 2100-2300 Protein: 116-128 g Fluid: >1.6L  Goal TPN rate is 90 ml/hr   Current Nutrition:  TPN NPO  Plan:  Increase TPN to 34mL/hr (Change to Travasol)  Add lipids to TPN today  This TPN provides 127 g of protein, 302 g of dextrose, and 58 g of lipids which provides 2,120 kCals per day, meeting 100% of patient needs Electrolytes in TPN: No Na,  continue increased K and Phos, Cl:Ac 1:2 Add MVI, trace elements to TPN Continue 20 units of regular insulin in TPN (dextrose reduced in TPN since adding lipids) Continue resistant SSI and Lantus 15 units daily per MD Monitor TPN labs  Give KCl 17mEq IV x 3 runs this morning   Elenor Quinones, PharmD, BCPS, [facility] Clinical Pharmacist Phone number [phone] 05/22/2018 7:43 AM

## 2018-05-22 NOTE — Progress Notes (Signed)
RT NOTE: Patient refusing chest PT at this time. Patient says it hurts her stomach and she has already done it once today. Vitals are stable. RT will continue to monitor.

## 2018-05-22 NOTE — Progress Notes (Signed)
Pt states her stomach is hurting now after 1 1/2 cups of ice. Ice is being withheld for now.

## 2018-05-22 NOTE — Progress Notes (Signed)
Patient arrived to room 5W19 in NAD. VS stable and husband at bedside.

## 2018-05-22 NOTE — Progress Notes (Signed)
TEAM 1 - Stepdown/ICU TEAM  Wendy [name]  LPF:[id number] DOB: 05-12-1960 DOA: 05/14/2018 PCP: Steele Sizer, MD    Brief Narrative:  58 year old female who presented with a 3 day history of abdominal pain, emesis, anorexia, and polyuria.  Labs notable hyperglycemia to 771 with anion gap of 21. CT abdom/pelvis showed small bowel obstruction with pneumoperitoneum. She underwent ex lap with small bowel resection.  Significant Events: 1/27 Admit > OR for ex lap with small bowel resection> ICU 1/29 OR for anastomosis and closure 2/1 Extubated 2/2 Hypoactive delirium, worsening respiratory status  Subjective: Alert and pleasant. Anxious to be allowed to drink. Denies cp, n/v, abdom pain, or sob.   Assessment & Plan:  Acute hypoxemic respiratory failure Intubated for surgery - extubated 2/1 - wean to RA as able   Acute metabolic encephalopathy - Hypoactive delirium CT head negative - minimize sedatives - slowly improving   Small bowel obstruction with perforation s/p resection and anastomosis w/ wound closure > Ileus Management per Gen Surgery - TNA continues   Septic shock secondary to Enterococcus faecium & Clostridium peritonitis and bacteremia Shock has resolved - BCx 1/27 +  for Clostridum sordelli - BCx 1/29 NGTD - continue 14 day course of Meropenem (to end 2/8)  Hypernatremia Pharmacy to adjust TNA accordingly - increase free water   DM2 - Uncontrolled w/ diabetic retinopathy  A1c 8.19 May 2017 - CBG poorly controlled - Pharmacy to adjust TNA accordingly   GERD  DVT prophylaxis: lovenox Code Status: FULL CODE Family Communication: no family present at time of exam  Disposition Plan: stable for tele bed   Consultants:  PCCM Gen Surgery   Antimicrobials:  Metronidazole 1/27> 2/1 Cefepime 1/27> 2/1 Anidulafungin 1/27 > 1/28  Meropenem 2/1 >  Objective: Blood pressure (!) 144/75, pulse 90, temperature 98.2 F (36.8 C), temperature source Oral, resp.  rate 16, height 5\' 3"  (1.6 m), weight 116.3 kg, last menstrual period 01/26/2017, SpO2 99 %.  Intake/Output Summary (Last 24 hours) at 05/22/2018 0923 Last data filed at 05/22/2018 0500 Gross per 24 hour  Intake 1682.83 ml  Output 1380 ml  Net 302.83 ml   Filed Weights   05/20/18 0500 05/21/18 0500 05/22/18 0454  Weight: 114.5 kg 116 kg 116.3 kg    Examination: General: No acute respiratory distress Lungs: mild bibasilar crackles - no wheezing  Cardiovascular: RRR - no M or rub  Abdomen: soft, non-distended - VAC/dressing in place  Extremities: 1+ B LE edema   CBC: Recent Labs  Lab 05/19/18 0521 05/20/18 0334 05/20/18 0913 05/21/18 0644  WBC 13.4* 13.4*  --  18.0*  NEUTROABS  --  8.5*  --  12.7*  HGB 9.0* 8.3* 16.0* 8.3*  HCT 28.0* 26.5* 47.0* 25.3*  MCV 82.8 81.5  --  81.6  PLT 368 423*  --  532*   Basic Metabolic Panel: Recent Labs  Lab 05/20/18 0334  05/20/18 2301 05/21/18 0644 05/21/18 1453 05/21/18 2053 05/22/18 0500  NA 152*   < >  --  155* 155* 152* 153*  K 3.4*   < > 5.2* 3.7 3.7 4.5 3.6  CL 120*  --   --  121* 120* 119* 118*  CO2 22  --   --  25 25 23 29   GLUCOSE 214*  --   --  301* 149* 241* 238*  BUN 18  --   --  17 15 16 14   CREATININE 0.80  --   --  0.79 0.74 0.78 0.77  CALCIUM 8.3*  --   --  8.2* 8.5* 8.4* 8.3*  MG 2.2  --   --  2.1  --   --  2.0  PHOS 1.3*  --  2.1* 2.1*  --   --  2.9   < > = values in this interval not displayed.   GFR: Estimated Creatinine Clearance: 94.4 mL/min (by C-G formula based on SCr of 0.77 mg/dL).  Liver Function Tests: Recent Labs  Lab 05/20/18 0334 05/21/18 0644 05/21/18 1453  AST 12* 14* 20  ALT 21 17 18   ALKPHOS 51 50 58  BILITOT 0.2* 0.4 0.3  PROT 5.0* 5.0* 5.7*  ALBUMIN 1.8* 1.8* 2.0*    HbA1C: HbA1c, POC (controlled diabetic range)  Date/Time Value Ref Range Status  02/06/2018 05:16 PM 8.2 (A) 0.0 - 7.0 % Final  11/06/2017 12:15 PM 9.7 (A) 0.0 - 7.0 % Final    CBG: Recent Labs  Lab  05/21/18 1521 05/21/18 1959 05/22/18 0017 05/22/18 0345 05/22/18 0749  GLUCAP 119* 196* 210* 196* 220*    Recent Results (from the past 240 hour(s))  Blood culture (routine x 2)     Status: Abnormal   Collection Time: 05/14/18  4:55 PM  Result Value Ref Range Status   Specimen Description BLOOD RIGHT ARM  Final   Special Requests   Final    BOTTLES DRAWN AEROBIC AND ANAEROBIC Blood Culture adequate volume   Culture  Setup Time (A)  Final    GRAM VARIABLE ROD ANAEROBIC BOTTLE ONLY CRITICAL RESULT CALLED TO, READ BACK BY AND VERIFIED WITHDenton Brick PHARMD 1950 05/18/18 A BROWNING Performed at [facility], [place] [address], [place], [place] [address]    Culture CLOSTRIDIUM SORDELLII (A)  Final   Report Status 05/22/2018 FINAL  Final  Blood Culture ID Panel (Reflexed)     Status: None   Collection Time: 05/14/18  4:55 PM  Result Value Ref Range Status   Enterococcus species NOT DETECTED NOT DETECTED Final   Listeria monocytogenes NOT DETECTED NOT DETECTED Final   Staphylococcus species NOT DETECTED NOT DETECTED Final   Staphylococcus aureus (BCID) NOT DETECTED NOT DETECTED Final   Streptococcus species NOT DETECTED NOT DETECTED Final   Streptococcus agalactiae NOT DETECTED NOT DETECTED Final   Streptococcus pneumoniae NOT DETECTED NOT DETECTED Final   Streptococcus pyogenes NOT DETECTED NOT DETECTED Final   Acinetobacter baumannii NOT DETECTED NOT DETECTED Final   Enterobacteriaceae species NOT DETECTED NOT DETECTED Final   Enterobacter cloacae complex NOT DETECTED NOT DETECTED Final   Escherichia coli NOT DETECTED NOT DETECTED Final   Klebsiella oxytoca NOT DETECTED NOT DETECTED Final   Klebsiella pneumoniae NOT DETECTED NOT DETECTED Final   Proteus species NOT DETECTED NOT DETECTED Final   Serratia marcescens NOT DETECTED NOT DETECTED Final   Haemophilus influenzae NOT DETECTED NOT DETECTED Final   Neisseria meningitidis NOT DETECTED NOT DETECTED Final   Pseudomonas  aeruginosa NOT DETECTED NOT DETECTED Final   Candida albicans NOT DETECTED NOT DETECTED Final   Candida glabrata NOT DETECTED NOT DETECTED Final   Candida krusei NOT DETECTED NOT DETECTED Final   Candida parapsilosis NOT DETECTED NOT DETECTED Final   Candida tropicalis NOT DETECTED NOT DETECTED Final    Comment: Performed at [facility], [place] [address], [place], [place] [address]  Culture, blood (routine x 2)     Status: None   Collection Time: 05/14/18  5:27 PM  Result Value Ref Range Status   Specimen Description  BLOOD A-LINE DRAW  Final   Special Requests   Final    BOTTLES DRAWN AEROBIC AND ANAEROBIC Blood Culture adequate volume   Culture NO GROWTH 5 DAYS  Final   Report Status 05/20/2018 FINAL  Final  Aerobic/Anaerobic Culture (surgical/deep wound)     Status: None   Collection Time: 05/14/18  6:48 PM  Result Value Ref Range Status   Specimen Description PERITONEAL  Final   Special Requests NONE  Final   Gram Stain   Final    NO WBC SEEN NO ORGANISMS SEEN Performed at [facility], [address] [place], [place] [address]    Culture   Final    MODERATE ENTEROCOCCUS FAECIUM MODERATE CLOSTRIDIUM SPECIES    Report Status 05/20/2018 FINAL  Final   Organism ID, Bacteria ENTEROCOCCUS FAECIUM  Final      Susceptibility   Enterococcus faecium - MIC*    AMPICILLIN <=2 SENSITIVE Sensitive     VANCOMYCIN <=0.5 SENSITIVE Sensitive     GENTAMICIN SYNERGY SENSITIVE Sensitive     * MODERATE ENTEROCOCCUS FAECIUM  Urine culture     Status: None   Collection Time: 05/14/18  8:14 PM  Result Value Ref Range Status   Specimen Description URINE, CATHETERIZED  Final   Special Requests NONE  Final   Culture   Final    NO GROWTH Performed at [facility], [place] [address], [place], [place] [address]    Report Status 05/16/2018 FINAL  Final  MRSA PCR Screening     Status: None   Collection Time: 05/14/18 10:35 PM  Result Value Ref Range Status   MRSA by PCR NEGATIVE  NEGATIVE Final    Comment:        The GeneXpert MRSA Assay (FDA approved for NASAL specimens only), is one component of a comprehensive MRSA colonization surveillance program. It is not intended to diagnose MRSA infection nor to guide or monitor treatment for MRSA infections. Performed at [facility], [place] [address] [place], [place] [address]   Culture, blood (routine x 2)     Status: None   Collection Time: 05/16/18  1:50 AM  Result Value Ref Range Status   Specimen Description BLOOD RIGHT HAND  Final   Special Requests   Final    BOTTLES DRAWN AEROBIC ONLY Blood Culture adequate volume   Culture   Final    NO GROWTH 5 DAYS Performed at [facility], [place] [address] [place], [place] [address]    Report Status 05/21/2018 FINAL  Final  Culture, blood (routine x 2)     Status: None   Collection Time: 05/16/18  2:05 AM  Result Value Ref Range Status   Specimen Description BLOOD LEFT ANTECUBITAL  Final   Special Requests   Final    BOTTLES DRAWN AEROBIC ONLY Blood Culture results may not be optimal due to an inadequate volume of blood received in culture bottles   Culture   Final    NO GROWTH 5 DAYS Performed at [facility], [place] [address] [place], [place] [address]    Report Status 05/21/2018 FINAL  Final     Scheduled Meds: . chlorhexidine  15 mL Mouth Rinse BID  . Chlorhexidine Gluconate Cloth  6 each Topical Daily  . enoxaparin (LOVENOX) injection  40 mg Subcutaneous Q24H  . etomidate  20 mg Intravenous Once  . fentaNYL (SUBLIMAZE) injection  100 mcg Intravenous Once  . insulin aspart  0-20 Units  Subcutaneous Q4H  . insulin glargine  15 Units Subcutaneous Daily  . mouth rinse  15 mL Mouth Rinse BID  . pantoprazole (PROTONIX) IV  40 mg Intravenous Q24H  . rocuronium  100 mg Intravenous Once  . sodium chloride flush  10-40 mL Intracatheter Q12H  . sodium chloride flush  10-40 mL Intracatheter Q12H  . succinylcholine  100 mg Intravenous Once    Continuous Infusions: . fentaNYL infusion INTRAVENOUS Stopped (05/18/18 1538)  . meropenem (MERREM) IV 2 g (05/22/18 0919)  . potassium chloride 10 mEq (05/22/18 0845)  . TPN ADULT (ION) 60 mL/hr at 05/22/18 0400  . TPN ADULT (ION)       LOS: 8 days   Cherene Altes, MD [facility] Office  [phone] Pager - Text Page per Amion  If 7PM-7AM, please contact night-coverage per Amion 05/22/2018, 9:23 AM

## 2018-05-23 ENCOUNTER — Inpatient Hospital Stay: Payer: Self-pay

## 2018-05-23 ENCOUNTER — Encounter (HOSPITAL_COMMUNITY): Payer: Self-pay | Admitting: General Practice

## 2018-05-23 DIAGNOSIS — Z794 Long term (current) use of insulin: Secondary | ICD-10-CM

## 2018-05-23 LAB — BASIC METABOLIC PANEL
Anion gap: 5 (ref 5–15)
BUN: 15 mg/dL (ref 6–20)
CHLORIDE: 114 mmol/L — AB (ref 98–111)
CO2: 30 mmol/L (ref 22–32)
Calcium: 8.5 mg/dL — ABNORMAL LOW (ref 8.9–10.3)
Creatinine, Ser: 0.72 mg/dL (ref 0.44–1.00)
GFR calc Af Amer: >60 mL/min (ref >60)
GFR calc non Af Amer: >60 mL/min (ref >60)
Glucose, Bld: 213 mg/dL — ABNORMAL HIGH (ref 70–99)
Potassium: 4 mmol/L (ref 3.5–5.1)
Sodium: 149 mmol/L — ABNORMAL HIGH (ref 135–145)

## 2018-05-23 LAB — GLUCOSE, CAPILLARY
GLUCOSE-CAPILLARY: 185 mg/dL — AB (ref 70–99)
Glucose-Capillary: 177 mg/dL — ABNORMAL HIGH (ref 70–99)
Glucose-Capillary: 219 mg/dL — ABNORMAL HIGH (ref 70–99)
Glucose-Capillary: 203 mg/dL — ABNORMAL HIGH (ref 70–99)
Glucose-Capillary: 186 mg/dL — ABNORMAL HIGH (ref 70–99)

## 2018-05-23 LAB — CBC
HCT: 27.9 % — ABNORMAL LOW (ref 36.0–46.0)
Hemoglobin: 8.7 g/dL — ABNORMAL LOW (ref 12.0–15.0)
MCH: 26.7 pg (ref 26.0–34.0)
MCHC: 31.2 g/dL (ref 30.0–36.0)
MCV: 85.6 fL (ref 80.0–100.0)
Platelets: 468 10*3/uL — ABNORMAL HIGH (ref 150–400)
RBC: 3.26 MIL/uL — AB (ref 3.87–5.11)
RDW: 16.5 % — ABNORMAL HIGH (ref 11.5–15.5)
WBC: 18.6 10*3/uL — ABNORMAL HIGH (ref 4.0–10.5)
nRBC: 0.4 % — ABNORMAL HIGH (ref 0.0–0.2)

## 2018-05-23 MED ORDER — VENLAFAXINE HCL ER 75 MG PO CP24
150.0000 mg | ORAL_CAPSULE | Freq: Every day | ORAL | Status: DC
  Administered 2018-05-23 – 2018-06-04 (×13): 150 mg via ORAL
  Filled 2018-05-23 (×13): qty 2

## 2018-05-23 MED ORDER — WHITE PETROLATUM EX OINT
TOPICAL_OINTMENT | CUTANEOUS | Status: AC
  Administered 2018-05-23: 0.2
  Filled 2018-05-23: qty 28.35

## 2018-05-23 MED ORDER — TRAVASOL 10 % IV SOLN
INTRAVENOUS | Status: AC
  Administered 2018-05-23: 19:00:00 via INTRAVENOUS
  Filled 2018-05-23: qty 1274.4

## 2018-05-23 MED ORDER — DEXTROSE 5 % IV SOLN
INTRAVENOUS | Status: DC
  Administered 2018-05-23 – 2018-05-24 (×2): via INTRAVENOUS

## 2018-05-23 NOTE — Progress Notes (Signed)
[place] NOTE   Pharmacy Consult for TPN Indication: ileus  Patient Measurements: Height: 5\' 3"  (160 cm) Weight: 256 lb 6.3 oz (116.3 kg) IBW/kg (Calculated) : 52.4 TPN AdjBW (KG): 68.8 Body mass index is 45.42 kg/m. Usual Weight: 117.9 kg  Assessment:  58 y/o female presenting to the ED with 3 days of vomiting and abdominal pain found to be in DKA. CT showed SBO. Noted SBO, pneumoperitoneum, intussusception with necrosis and performation and was taken to the OR emergently for ex lap, SBR left in discontinuity, and abthera VAC on 1/27. Taken back to the OR on 1/29 for ex lap with SB anastomosis and fascia closure. High risk for refeeding.  Husband reports patient eating well prior to experiencing GI distress.  GI: SBO, pneumoperitoneum, intussusception with necrosis and perforation. Patient reports abdominal pain with ice chips. Albumin 2.0, prealbumin improving 10.1>14.4, NG output down to 240 mL, PPI IV Endo: hx DM, CBGs slightly better but still uncontrolled (170-230s) Insulin requirements in the past 24 hours: 33 units SSI, 20 units in TPN, and 15 units of Lantus Lytes: Na down further to 149, Cl elevated but down to 114, other lytes wnl. K 4 (goal >= 4), Mag 2.0 (goal >=2), Phos 2.9.  4-6 L free water deficit  Renal: AKI resolved, SCr stable, UOP low Pulm: extubated Cards: MAP 80-90s, tachy Hepatobil: LFT and T bili WNL, TG 147 Neuro: awake but drowsy ID: Abx D9/10, meropenem for IAI, Tm 99.1, WBC 18.6  TPN Access: double lumen CVC placed 05/14/18 TPN start date: 05/19/18  Nutritional Goals (per RD recommendation on 05/19/18): KCal: 2100-2300 Protein: 116-128 g Fluid: >1.6L  Goal TPN rate is 90 ml/hr   Current Nutrition:  TPN NPO w/ ice chips  Plan:  Continue TPN at 61mL/hr This TPN provides 127 g of protein, 302 g of dextrose, and 58 g of lipids which provides 2,120 kCals per day, meeting 100% of patient needs Electrolytes in  TPN: Add back low Na at 37mEq/L, continue increased K and Phos, Cl:Ac 1:2 Add MVI, trace elements to TPN Increase to 30 units of regular insulin in TPN Continue resistant SSI and Lantus increased to 24 units per MD Monitor TPN labs   Elenor Quinones, PharmD, BCPS, [facility] Clinical Pharmacist Phone number [phone] 05/23/2018 7:57 AM

## 2018-05-23 NOTE — Progress Notes (Addendum)
Occupational Therapy Treatment Patient Details Name: Wendy [name] MRN: [id number] DOB: 05-03-1960 Today's Date: 05/23/2018    History of present illness 58 year old female who presented with 3-day history of abdominal pain, emesis, anorexia, and polyuria for the past 3 days.  Labs notable hyperglycemia 771 with anion gap of 21.  CT abdomen pelvis showed small bowel obstruction with pneumoperitoneum and she underwent ex lap with small bowel resection.   OT comments  Pt received in bed preparing to transfer to [facility] with NT and spouse present at start of session. Pt completed bed mobility at mod A +2 for safety. She sat EOB a few of minutes then completed pivotal steps to [facility] with +2 HHA and min A to steady. Pt sat several minutes on [facility] and asked therapist to leave stating that she would be awhile and did not need assist. Pt declined further OOB activity after toileting. Pt conversational throughout and with appropriate speech content. Pt agreed that she would call for assistance to return to bed and call bell left beside pt. NT notified. D/c plan remains appropriate.    Follow Up Recommendations  CIR;Supervision/Assistance - 24 hour    Equipment Recommendations  Other (comment)(Defer to next venue)    Recommendations for Other Services      Precautions / Restrictions Precautions Precautions: Fall;Other (comment) Precaution Comments: wound vac, NG tube, central line, foley Restrictions Weight Bearing Restrictions: No       Mobility Bed Mobility Overal bed mobility: Needs Assistance Bed Mobility: Supine to Sit     Supine to sit: Mod assist;+2 for physical assistance     General bed mobility comments: increased time and effort.   Transfers Overall transfer level: Needs assistance Equipment used: 2 person hand held assist Transfers: Sit to/from [facility] Sit to Stand: Mod assist;+2 physical assistance Stand pivot transfers: +2 physical assistance;Min assist        General transfer comment: cues for hand placement, assist to power up and stabilize balance, pivot steps from bed to recliner    Balance Overall balance assessment: Needs assistance Sitting-balance support: Feet supported;Bilateral upper extremity supported Sitting balance-Leahy Scale: Fair     Standing balance support: Bilateral upper extremity supported;During functional activity Standing balance-Leahy Scale: Poor Standing balance comment: reliant on external support                           ADL either performed or assessed with clinical judgement   ADL Overall ADL's : Needs assistance/impaired                         Toilet Transfer: Minimal assistance;Moderate assistance;+2 for physical assistance;Stand-pivot;BSC             General ADL Comments: Pt completed bed mobility, toilet transfer as detailed above. Stated she would be on the [facility] for a awhile, asked therapist to leave and stated she would call for help. Declined further mobility at this time stating that she would be worn out after toileting tasks.      Vision       Perception     Praxis      Cognition Arousal/Alertness: Awake/alert Behavior During Therapy: Flat affect Overall Cognitive Status: Within Functional Limits for tasks assessed                                 General Comments: speech content  appropriate. some slow processing.        Exercises     Shoulder Instructions       General Comments On RA for toilet transfer with sats dropping to 78. Encouraged breathing technique and sats quickly recovering to 93.    Pertinent Vitals/ Pain       Pain Assessment: 0-10 Pain Score: 4  Faces Pain Scale: Hurts even more Pain Location: abdomen Pain Descriptors / Indicators: Grimacing;Guarding Pain Intervention(s): Monitored during session;Repositioned;Ice applied  Home Living Family/patient expects to be discharged to:: Private residence Living  Arrangements: Spouse/significant other                                      Prior Functioning/Environment              Frequency  Min 2X/week        Progress Toward Goals  OT Goals(current goals can now be found in the care plan section)  Progress towards OT goals: Progressing toward goals  Acute Rehab OT Goals Patient Stated Goal: Return to independence OT Goal Formulation: With patient/family Time For Goal Achievement: 06/04/18 Potential to Achieve Goals: Good ADL Goals Pt Will Perform Grooming: with set-up;with supervision;sitting Pt Will Perform Upper Body Dressing: with set-up;with supervision;sitting Pt Will Perform Lower Body Dressing: with min guard assist;with adaptive equipment;sit to/from stand Pt Will Transfer to Toilet: with min guard assist;ambulating;bedside commode Pt/caregiver will Perform Home Exercise Program: Increased ROM;Increased strength;Both right and left upper extremity;With written HEP provided;With Supervision Additional ADL Goal #1: Pt will perform bed mobility at supervision level in preparation for ADLs Additional ADL Goal #2: Pt will demonstrate selective attention during ADL with Min cues  Plan Discharge plan remains appropriate    Co-evaluation    PT/OT/SLP Co-Evaluation/Treatment: Yes Reason for Co-Treatment: Complexity of the patient's impairments (multi-system involvement);For patient/therapist safety;To address functional/ADL transfers   OT goals addressed during session: ADL's and self-care;Proper use of Adaptive equipment and DME      AM-PAC OT "6 Clicks" Daily Activity     Outcome Measure   Help from another person eating meals?: Total(NPO) Help from another person taking care of personal grooming?: A Little Help from another person toileting, which includes using toliet, bedpan, or urinal?: A Lot Help from another person bathing (including washing, rinsing, drying)?: A Lot Help from another person to put  on and taking off regular upper body clothing?: A Lot Help from another person to put on and taking off regular lower body clothing?: A Lot 6 Click Score: 12    End of Session Equipment Utilized During Treatment: Gait belt  OT Visit Diagnosis: Unsteadiness on feet (R26.81);Other abnormalities of gait and mobility (R26.89);Muscle weakness (generalized) (M62.81);Other symptoms and signs involving cognitive function;Pain   Activity Tolerance Patient tolerated treatment well;Patient limited by fatigue   Patient Left in chair;with call bell/phone within reach;with chair alarm set;with family/visitor present   Nurse Communication Mobility status        Time: 1010-1028 OT Time Calculation (min): 18 min  Charges: OT General Charges $OT Visit: 1 Visit  Tyrone Schimke, OT Acute Rehabilitation Services Pager: [phone] Office: [phone]    Hortencia Pilar 05/23/2018, 11:17 AM

## 2018-05-23 NOTE — Progress Notes (Signed)
Pt's husband explained that NGT was dislodged when pt got up to use the bathroom. NGT was removed. Pt denies nausea, vomiting or flatus. Continue clears from the floor only until pt passes flatus.   Jackson Latino, [facility] Surgery Pager [phone]

## 2018-05-23 NOTE — Progress Notes (Signed)
Physical Therapy Treatment Patient Details Name: Wendy [name] MRN: [id number] DOB: Feb 27, 1961 Today's Date: 05/23/2018    History of Present Illness 58 year old female who presented with 3-day history of abdominal pain, emesis, anorexia, and polyuria for the past 3 days.  Labs notable hyperglycemia 771 with anion gap of 21.  CT abdomen pelvis showed small bowel obstruction with pneumoperitoneum and she underwent ex lap with small bowel resection.    PT Comments    Pt attempting to get out of bed and PTA and OT entered room to relieve tech.  Pt required assistance to mobilize to edge of bed and achieve standing to pivot from bed to commode.  Pt once on commode reports she wished to be left alone.  PTA/OT made sure call bell was in reach.  Upon leaving patient conversive and appropriate and reports," I'm fine."  OT informed nurse tech that patient wished to remain on bedside commode.  Pt continues to benefit from rehab in a post acute setting to improve strength and function before returning home.  Plan next session for progression of gait training with RW.      Follow Up Recommendations  CIR     Equipment Recommendations  Other (comment)(TBA)    Recommendations for Other Services       Precautions / Restrictions Precautions Precautions: Fall;Other (comment) Precaution Comments: wound vac, NG tube, central line, foley Restrictions Weight Bearing Restrictions: No    Mobility  Bed Mobility Overal bed mobility: Needs Assistance Bed Mobility: Supine to Sit     Supine to sit: Mod assist;+2 for physical assistance     General bed mobility comments: increased time and effort. PTA assisted patient to edge of bed and to elevate trunk into sitting.  Pt required use of bed pad to scoot to edge of bed.    Transfers Overall transfer level: Needs assistance Equipment used: 2 person hand held assist Transfers: Sit to/from [facility] Sit to Stand: Mod assist;+2 physical  assistance Stand pivot transfers: +2 physical assistance;Min assist       General transfer comment: cues for hand placement, assist to power up and stabilize balance, pivot steps from bed to recliner  Ambulation/Gait Ambulation/Gait assistance: Min assist;+2 physical assistance Gait Distance (Feet): (37ft from bed to bed side commode) Assistive device: 2 person hand held assist Gait Pattern/deviations: Step-to pattern;Shuffle;Trunk flexed     General Gait Details: Pt took steps from bed to bedside commode.     Stairs             Wheelchair Mobility    Modified Rankin (Stroke Patients Only)       Balance Overall balance assessment: Needs assistance Sitting-balance support: Feet supported;Bilateral upper extremity supported Sitting balance-Leahy Scale: Fair     Standing balance support: Bilateral upper extremity supported;During functional activity Standing balance-Leahy Scale: Poor Standing balance comment: reliant on external support                            Cognition Arousal/Alertness: Awake/alert Behavior During Therapy: Flat affect Overall Cognitive Status: Within Functional Limits for tasks assessed                                 General Comments: speech content appropriate. some slow processing.      Exercises      General Comments General comments (skin integrity, edema, etc.): On RA for toilet transfer with  sats dropping to 78. Encouraged breathing technique and sats quickly recovering to 93.      Pertinent Vitals/Pain Pain Assessment: 0-10 Pain Score: 4  Faces Pain Scale: Hurts even more Pain Location: abdomen Pain Descriptors / Indicators: Grimacing;Guarding Pain Intervention(s): Monitored during session;Repositioned;Ice applied    Home Living Family/patient expects to be discharged to:: Private residence Living Arrangements: Spouse/significant other                  Prior Function            PT  Goals (current goals can now be found in the care plan section) Acute Rehab PT Goals Patient Stated Goal: Return to independence Potential to Achieve Goals: Good Progress towards PT goals: Progressing toward goals    Frequency    Min 3X/week      PT Plan Current plan remains appropriate    Co-evaluation   Reason for Co-Treatment: Complexity of the patient's impairments (multi-system involvement);For patient/therapist safety;To address functional/ADL transfers   OT goals addressed during session: ADL's and self-care;Proper use of Adaptive equipment and DME      AM-PAC PT "6 Clicks" Mobility   Outcome Measure  Help needed turning from your back to your side while in a flat bed without using bedrails?: A Lot Help needed moving from lying on your back to sitting on the side of a flat bed without using bedrails?: A Lot Help needed moving to and from a bed to a chair (including a wheelchair)?: A Lot Help needed standing up from a chair using your arms (e.g., wheelchair or bedside chair)?: A Lot Help needed to walk in hospital room?: A Lot Help needed climbing 3-5 steps with a railing? : A Lot 6 Click Score: 12    End of Session Equipment Utilized During Treatment: Oxygen Activity Tolerance: Patient tolerated treatment well Patient left: with call bell/phone within reach(Pt seated on commode.  reports she does not want anyone in the room while she uses the toilet.  Repeatedly reports," I'm fine.") Nurse Communication: Other (comment)(informed RN tech that call bell in reach and patient wished to be alone to complete BM.  ) PT Visit Diagnosis: Muscle weakness (generalized) (M62.81);Difficulty in walking, not elsewhere classified (R26.2);Pain     Time: 1010-1028 PT Time Calculation (min) (ACUTE ONLY): 18 min  Charges:  $Gait Training: 8-22 mins                     Governor Rooks, PTA Acute Rehabilitation Services Pager [phone] Office [phone]     Cristela Blue 05/23/2018, 11:23 AM

## 2018-05-23 NOTE — Progress Notes (Addendum)
[facility]/Trauma Progress Note  7 Days Post-Op   Assessment/Plan DKA- per medicine, improving  Sepsis- per primary SBO,Pneumoperitoneum, intussusception with necrosis & perforation - S/P ex lap, small bowel resection, closure with abthera vac, Dr. Grandville Silos, 01/17 -S/PExploratorylaparotomy with small bowel anastomosis and closure of abdominal fascia, Dr. Brantley Stage, 01/29 - NGT output 240cc in last 24hrs  VVO:HYWVP NGT, clears from floor, TNA VTE: SCD's, started lovenox 01/30 XT:[id number]/03, Vanc 01/27; 01/31>>,Cefepime &Flagyl 01/27>>afebrile, blood cultures 01/29NGTD, peritoneal fluid cultures showed Enterococcus and Clostridium, WBC 18.3 on 02/05 Foley:DC today  Follow up:TBD  DISPO:clamp NGT and allow clears from the floor,TNA, ambulate/PT, appears deconditioned, trend WBC   LOS: 9 days    Subjective: CC: no complaints  Pt states not flatus that she knows of. Husband at bedside. He is asking if we can restart her antidepressant. I stated when she is able to tolerated PO we will resume that med.   Objective: Vital signs in last 24 hours: Temp:  [97.3 F (36.3 C)-98.7 F (37.1 C)] 98.4 F (36.9 C) (02/05 0417) Pulse Rate:  [79-102] 86 (02/05 0652) Resp:  [16-21] 17 (02/05 0417) BP: (136-160)/(63-90) 142/77 (02/05 0652) SpO2:  [100 %] 100 % (02/05 0417) FiO2 (%):  [36 %] 36 % (02/04 1134) Last BM Date: (PTA)  Intake/Output from previous day: 02/04 0701 - 02/05 0700 In: 360 [P.O.:360] Out: 1040 [Urine:800; Emesis/NG output:240] Intake/Output this shift: No intake/output data recorded.  PE: Gen: Alert, NAD, sleepy Pulm:rate and effort normal Abd: Soft,ND, vac in place,No TTP, few BS appreciated, vac in place of midline, NGT output is scant and bilious Skin: no rashes noted, warm and dry   Anti-infectives: Anti-infectives (From admission, onward)   Start     Dose/Rate Route Frequency Ordered Stop   05/19/18 1400   vancomycin (VANCOCIN) 1,250 mg in sodium chloride 0.9 % 250 mL IVPB  Status:  Discontinued     1,250 mg 166.7 mL/hr over 90 Minutes Intravenous Every 24 hours 05/18/18 1343 05/19/18 1244   05/19/18 1400  meropenem (MERREM) 2 g in sodium chloride 0.9 % 100 mL IVPB     2 g 200 mL/hr over 30 Minutes Intravenous Every 8 hours 05/19/18 1244 05/26/18 2359   05/18/18 1600  cefTRIAXone (ROCEPHIN) 2 g in sodium chloride 0.9 % 100 mL IVPB  Status:  Discontinued     2 g 200 mL/hr over 30 Minutes Intravenous Every 24 hours 05/18/18 1326 05/19/18 1244   05/18/18 1400  vancomycin (VANCOCIN) 2,000 mg in sodium chloride 0.9 % 500 mL IVPB     2,000 mg 250 mL/hr over 120 Minutes Intravenous  Once 05/18/18 1343 05/18/18 1640   05/15/18 2333  ceFEPIme (MAXIPIME) 2 g in sodium chloride 0.9 % 100 mL IVPB  Status:  Discontinued     2 g 200 mL/hr over 30 Minutes Intravenous Every 8 hours 05/15/18 2226 05/18/18 1326   05/15/18 1500  ceFEPIme (MAXIPIME) 1 g in sodium chloride 0.9 % 100 mL IVPB  Status:  Discontinued     1 g 200 mL/hr over 30 Minutes Intravenous Every 24 hours 05/14/18 1512 05/15/18 2226   05/15/18 0000  anidulafungin (ERAXIS) 100 mg in sodium chloride 0.9 % 100 mL IVPB  Status:  Discontinued     100 mg 78 mL/hr over 100 Minutes Intravenous Every 24 hours 05/14/18 1728 05/15/18 1159   05/14/18 1800  anidulafungin (ERAXIS) 200 mg in sodium chloride 0.9 % 200 mL IVPB     200 mg 78 mL/hr over  200 Minutes Intravenous  Once 05/14/18 1751 05/14/18 2138   05/14/18 1745  vancomycin (VANCOCIN) 2,000 mg in sodium chloride 0.9 % 500 mL IVPB  Status:  Discontinued     2,000 mg 250 mL/hr over 120 Minutes Intravenous  Once 05/14/18 1741 05/15/18 1159   05/14/18 1741  vancomycin variable dose per unstable renal function (pharmacist dosing)  Status:  Discontinued      Does not apply See admin instructions 05/14/18 1741 05/15/18 1159   05/14/18 1515  ceFEPIme (MAXIPIME) 2 g in sodium chloride 0.9 % 100 mL IVPB      2 g 200 mL/hr over 30 Minutes Intravenous  Once 05/14/18 1512 05/14/18 1649   05/14/18 1515  metroNIDAZOLE (FLAGYL) IVPB 500 mg  Status:  Discontinued     500 mg 100 mL/hr over 60 Minutes Intravenous Every 8 hours 05/14/18 1512 05/19/18 1244      Lab Results:  Recent Labs    05/22/18 1549 05/23/18 0332  WBC 17.0* 18.6*  HGB 10.8* 8.7*  HCT 33.0* 27.9*  PLT 259 468*   BMET Recent Labs    05/22/18 0500 05/23/18 0332  NA 153* 149*  K 3.6 4.0  CL 118* 114*  CO2 29 30  GLUCOSE 238* 213*  BUN 14 15  CREATININE 0.77 0.72  CALCIUM 8.3* 8.5*   PT/INR No results for input(s): LABPROT, INR in the last 72 hours. CMP     Component Value Date/Time   NA 149 (H) 05/23/2018 0332   NA 144 12/25/2014 1536   K 4.0 05/23/2018 0332   CL 114 (H) 05/23/2018 0332   CO2 30 05/23/2018 0332   GLUCOSE 213 (H) 05/23/2018 0332   BUN 15 05/23/2018 0332   BUN 9 12/25/2014 1536   CREATININE 0.72 05/23/2018 0332   CREATININE 0.69 05/25/2017 1150   CALCIUM 8.5 (L) 05/23/2018 0332   PROT 5.7 (L) 05/21/2018 1453   PROT 6.7 12/25/2014 1536   ALBUMIN 2.0 (L) 05/21/2018 1453   ALBUMIN 4.4 12/25/2014 1536   AST 20 05/21/2018 1453   ALT 18 05/21/2018 1453   ALKPHOS 58 05/21/2018 1453   BILITOT 0.3 05/21/2018 1453   BILITOT 0.3 12/25/2014 1536   GFRNONAA >60 05/23/2018 0332   GFRNONAA 78 08/25/2016 1100   GFRAA >60 05/23/2018 0332   GFRAA >89 08/25/2016 1100   Lipase     Component Value Date/Time   LIPASE 37 05/14/2018 2014    Studies/Results: No results found.    Kalman Drape , [facility] Surgery 05/23/2018, 9:06 AM  Pager: [phone] Mon-Wed, Friday 7:00am-4:30pm Thurs 7am-11:30am  Consults: [phone]

## 2018-05-23 NOTE — Progress Notes (Signed)
I agree with previous nurse's assessment. 

## 2018-05-23 NOTE — Progress Notes (Signed)
PROGRESS NOTE        PATIENT DETAILS Name: Wendy [name] Age: 58 y.o. Sex: female Date of Birth: 1960/04/25 Admit Date: 05/14/2018 Admitting Physician Rush Farmer, MD [id number], Drue Stager, MD  Brief Narrative: Patient is a 58 y.o. female with history of DM-2, pretension, GERD, morbid obesity who presented with a 3-day history of vomiting and abdominal pain-found to have sepsis secondary to small bowel obstruction and small perforation, along with DKA.  She was started on IV insulin, empiric antimicrobial therapy-General surgery was consulted-and patient was admitted to the ICU under the PCCM service.  Significant Events: 1/27 Admit > OR for ex lap with small bowel resection> ICU 1/29 OR for anastomosis and closure 2/1 Extubated 2/2 Hypoactive delirium, worsening respiratory status  Subjective: Lying comfortably in bed-denies any chest pain or shortness of breath.  Has not had a bowel movement or flatus in the past few days.  Assessment/Plan: Acute hypoxic respiratory failure: Extubated on 2/1 (intubated for surgery) continue to wean to RA as able.  Septic shock secondary to peritonitis with bowel obstruction along with Clostridium bacteremia: Sepsis pathophysiology is resolved-peritoneal fluid cultures positive for enterococcus and Clostridium species, blood cultures positive for Clostridium species as well.  Continue meropenem  Small bowel obstruction with perforation s/p resection and closure of the abdominal fascia: Postop course now complicated by ileus.  TNA continues-General surgery following-started on clear liquids today.  Anemia: Secondary to acute illness-no evidence of overt blood loss-follow hemoglobin periodically.  Hypernatremia: Improving-start gentle hydration with D5W.  Type II DM with uncontrolled hyperglycemia: Had severe hypoglycemia with probable DKA on admission-CBGs relatively stable-continue Lantus 24 units daily and SSI.  Follow and  optimize accordingly  Hypertension: Blood pressure relatively controlled-would watch for now-once oral intake is more stable-we will resume antihypertensives  GERD: Continue PPI  Dyslipidemia: Resume statin when oral intake is further stabilized  Debility/deconditioning: Secondary to acute illness/critical illness-CIR when more stable over the next few days.  Other: Discontinue Foley catheter, discontinue right IJ catheter-Place PICC line as patient still requires TNA and needs IV antibiotics for a few more days.  DVT Prophylaxis: Prophylactic Lovenox   Code Status: Full code   Family Communication: Spouse at bedside  Disposition Plan: Remain inpatient-suspect CIR on discharge  Antimicrobial agents: Anti-infectives (From admission, onward)   Start     Dose/Rate Route Frequency Ordered Stop   05/19/18 1400  vancomycin (VANCOCIN) 1,250 mg in sodium chloride 0.9 % 250 mL IVPB  Status:  Discontinued     1,250 mg 166.7 mL/hr over 90 Minutes Intravenous Every 24 hours 05/18/18 1343 05/19/18 1244   05/19/18 1400  meropenem (MERREM) 2 g in sodium chloride 0.9 % 100 mL IVPB     2 g 200 mL/hr over 30 Minutes Intravenous Every 8 hours 05/19/18 1244 05/26/18 2359   05/18/18 1600  cefTRIAXone (ROCEPHIN) 2 g in sodium chloride 0.9 % 100 mL IVPB  Status:  Discontinued     2 g 200 mL/hr over 30 Minutes Intravenous Every 24 hours 05/18/18 1326 05/19/18 1244   05/18/18 1400  vancomycin (VANCOCIN) 2,000 mg in sodium chloride 0.9 % 500 mL IVPB     2,000 mg 250 mL/hr over 120 Minutes Intravenous  Once 05/18/18 1343 05/18/18 1640   05/15/18 2333  ceFEPIme (MAXIPIME) 2 g in sodium chloride 0.9 % 100 mL IVPB  Status:  Discontinued  2 g 200 mL/hr over 30 Minutes Intravenous Every 8 hours 05/15/18 2226 05/18/18 1326   05/15/18 1500  ceFEPIme (MAXIPIME) 1 g in sodium chloride 0.9 % 100 mL IVPB  Status:  Discontinued     1 g 200 mL/hr over 30 Minutes Intravenous Every 24 hours 05/14/18 1512  05/15/18 2226   05/15/18 0000  anidulafungin (ERAXIS) 100 mg in sodium chloride 0.9 % 100 mL IVPB  Status:  Discontinued     100 mg 78 mL/hr over 100 Minutes Intravenous Every 24 hours 05/14/18 1728 05/15/18 1159   05/14/18 1800  anidulafungin (ERAXIS) 200 mg in sodium chloride 0.9 % 200 mL IVPB     200 mg 78 mL/hr over 200 Minutes Intravenous  Once 05/14/18 1751 05/14/18 2138   05/14/18 1745  vancomycin (VANCOCIN) 2,000 mg in sodium chloride 0.9 % 500 mL IVPB  Status:  Discontinued     2,000 mg 250 mL/hr over 120 Minutes Intravenous  Once 05/14/18 1741 05/15/18 1159   05/14/18 1741  vancomycin variable dose per unstable renal function (pharmacist dosing)  Status:  Discontinued      Does not apply See admin instructions 05/14/18 1741 05/15/18 1159   05/14/18 1515  ceFEPIme (MAXIPIME) 2 g in sodium chloride 0.9 % 100 mL IVPB     2 g 200 mL/hr over 30 Minutes Intravenous  Once 05/14/18 1512 05/14/18 1649   05/14/18 1515  metroNIDAZOLE (FLAGYL) IVPB 500 mg  Status:  Discontinued     500 mg 100 mL/hr over 60 Minutes Intravenous Every 8 hours 05/14/18 1512 05/19/18 1244      Procedures: 1/29>> exploratory laparotomy with small bowel anastomosis and closure of abdominal fascia 1/27>>Right IJ (Anesthesia) 1/27>>EXPLORATORY LAPAROTOMY,SMALL BOWEL RESECTION,CLOSURE WITH ABTHERA VAC  CONSULTS:  pulmonary/intensive care and general surgery  Time spent: 25- minutes-Greater than 50% of this time was spent in counseling, explanation of diagnosis, planning of further management, and coordination of care.  MEDICATIONS: Scheduled Meds: . chlorhexidine  15 mL Mouth Rinse BID  . Chlorhexidine Gluconate Cloth  6 each Topical Daily  . enoxaparin (LOVENOX) injection  40 mg Subcutaneous Q24H  . fluticasone  2 spray Each Nare Daily  . insulin aspart  0-20 Units Subcutaneous Q4H  . insulin glargine  24 Units Subcutaneous Daily  . mouth rinse  15 mL Mouth Rinse BID  . pantoprazole (PROTONIX) IV  40 mg  Intravenous Q24H  . sodium chloride flush  10-40 mL Intracatheter Q12H  . venlafaxine XR  150 mg Oral Daily  . white petrolatum       Continuous Infusions: . meropenem (MERREM) IV 2 g (05/23/18 0600)  . TPN ADULT (ION) 90 mL/hr at 05/22/18 1822  . TPN ADULT (ION)     PRN Meds:.acetaminophen, fentaNYL (SUBLIMAZE) injection, lip balm, sodium chloride flush   PHYSICAL EXAM: Vital signs: Vitals:   05/22/18 1529 05/22/18 2204 05/23/18 0417 05/23/18 0652  BP: 137/68 (!) 159/90 (!) 160/83 (!) 142/77  Pulse: 87 85 85 86  Resp: 18  17   Temp: (!) 97.3 F (36.3 C) 98.7 F (37.1 C) 98.4 F (36.9 C)   TempSrc: Axillary Oral Oral   SpO2: 100% 100% 100%   Weight:      Height:       Filed Weights   05/20/18 0500 05/21/18 0500 05/22/18 0454  Weight: 114.5 kg 116 kg 116.3 kg   Body mass index is 45.42 kg/m.   General appearance :Awake, alert, not in any distress. Speech Clear. Eyes:, pupils  equally reactive to light and accomodation,no scleral icterus.Pink conjunctiva HEENT: Atraumatic and Normocephalic Resp:Good air entry bilaterally, no added sounds  CVS: S1 S2 regular, no murmurs.  GI: Do not hear any bowel sounds-abdomen is soft and appropriately tender.   Extremities: B/L Lower Ext shows no edema, both legs are warm to touch Neurology:  speech clear,Non focal, sensation is grossly intact. Musculoskeletal:No digital cyanosis Skin:No Rash, warm and dry Wounds:N/A  I have personally reviewed following labs and imaging studies  LABORATORY DATA: CBC: Recent Labs  Lab 05/19/18 0521 05/20/18 0334 05/20/18 0913 05/21/18 0644 05/22/18 1549 05/23/18 0332  WBC 13.4* 13.4*  --  18.0* 17.0* 18.6*  NEUTROABS  --  8.5*  --  12.7*  --   --   HGB 9.0* 8.3* 16.0* 8.3* 10.8* 8.7*  HCT 28.0* 26.5* 47.0* 25.3* 33.0* 27.9*  MCV 82.8 81.5  --  81.6 80.7 85.6  PLT 368 423*  --  450* 259 468*    Basic Metabolic Panel: Recent Labs  Lab 05/19/18 0521 05/20/18 0334  05/20/18 2301  05/21/18 0644 05/21/18 1453 05/21/18 2053 05/22/18 0500 05/23/18 0332  NA 150* 152*   < >  --  155* 155* 152* 153* 149*  K 3.6 3.4*   < > 5.2* 3.7 3.7 4.5 3.6 4.0  CL 119* 120*  --   --  121* 120* 119* 118* 114*  CO2 19* 22  --   --  25 25 23 29 30   GLUCOSE 107* 214*  --   --  301* 149* 241* 238* 213*  BUN 20 18  --   --  17 15 16 14 15   CREATININE 1.09* 0.80  --   --  0.79 0.74 0.78 0.77 0.72  CALCIUM 8.4* 8.3*  --   --  8.2* 8.5* 8.4* 8.3* 8.5*  MG 2.3 2.2  --   --  2.1  --   --  2.0  --   PHOS 2.0* 1.3*  --  2.1* 2.1*  --   --  2.9  --    < > = values in this interval not displayed.    GFR: Estimated Creatinine Clearance: 94.4 mL/min (by C-G formula based on SCr of 0.72 mg/dL).  Liver Function Tests: Recent Labs  Lab 05/20/18 0334 05/21/18 0644 05/21/18 1453  AST 12* 14* 20  ALT 21 17 18   ALKPHOS 51 50 58  BILITOT 0.2* 0.4 0.3  PROT 5.0* 5.0* 5.7*  ALBUMIN 1.8* 1.8* 2.0*   No results for input(s): LIPASE, AMYLASE in the last 168 hours. No results for input(s): AMMONIA in the last 168 hours.  Coagulation Profile: No results for input(s): INR, PROTIME in the last 168 hours.  Cardiac Enzymes: No results for input(s): CKTOTAL, CKMB, CKMBINDEX, TROPONINI in the last 168 hours.  BNP (last 3 results) No results for input(s): PROBNP in the last 8760 hours.  HbA1C: No results for input(s): HGBA1C in the last 72 hours.  CBG: Recent Labs  Lab 05/22/18 2009 05/22/18 2349 05/23/18 0414 05/23/18 0813 05/23/18 1211  GLUCAP 199* 196* 177* 219* 203*    Lipid Profile: Recent Labs    05/21/18 0644  TRIG 147    Thyroid Function Tests: No results for input(s): TSH, T4TOTAL, FREET4, T3FREE, THYROIDAB in the last 72 hours.  Anemia Panel: No results for input(s): VITAMINB12, FOLATE, FERRITIN, TIBC, IRON, RETICCTPCT in the last 72 hours.  Urine analysis:    Component Value Date/Time   COLORURINE YELLOW 05/14/2018 2014   APPEARANCEUR  CLOUDY (A) 05/14/2018 2014     LABSPEC 1.023 05/14/2018 2014   PHURINE 5.0 05/14/2018 2014   GLUCOSEU >=500 (A) 05/14/2018 2014   HGBUR MODERATE (A) 05/14/2018 2014   BILIRUBINUR NEGATIVE 05/14/2018 2014   KETONESUR 20 (A) 05/14/2018 2014   PROTEINUR 100 (A) 05/14/2018 2014   NITRITE NEGATIVE 05/14/2018 2014   LEUKOCYTESUR TRACE (A) 05/14/2018 2014    Sepsis Labs: Lactic Acid, Venous    Component Value Date/Time   LATICACIDVEN 1.3 05/14/2018 2223    MICROBIOLOGY: Recent Results (from the past 240 hour(s))  Blood culture (routine x 2)     Status: Abnormal   Collection Time: 05/14/18  4:55 PM  Result Value Ref Range Status   Specimen Description BLOOD RIGHT ARM  Final   Special Requests   Final    BOTTLES DRAWN AEROBIC AND ANAEROBIC Blood Culture adequate volume   Culture  Setup Time (A)  Final    GRAM VARIABLE ROD ANAEROBIC BOTTLE ONLY CRITICAL RESULT CALLED TO, READ BACK BY AND VERIFIED WITHDenton Brick PHARMD [phone] 05/18/18 A BROWNING Performed at [facility], [place] [address] [place], [place] [address]    Culture CLOSTRIDIUM SORDELLII (A)  Final   Report Status 05/22/2018 FINAL  Final  Blood Culture ID Panel (Reflexed)     Status: None   Collection Time: 05/14/18  4:55 PM  Result Value Ref Range Status   Enterococcus species NOT DETECTED NOT DETECTED Final   Listeria monocytogenes NOT DETECTED NOT DETECTED Final   Staphylococcus species NOT DETECTED NOT DETECTED Final   Staphylococcus aureus (BCID) NOT DETECTED NOT DETECTED Final   Streptococcus species NOT DETECTED NOT DETECTED Final   Streptococcus agalactiae NOT DETECTED NOT DETECTED Final   Streptococcus pneumoniae NOT DETECTED NOT DETECTED Final   Streptococcus pyogenes NOT DETECTED NOT DETECTED Final   Acinetobacter baumannii NOT DETECTED NOT DETECTED Final   Enterobacteriaceae species NOT DETECTED NOT DETECTED Final   Enterobacter cloacae complex NOT DETECTED NOT DETECTED Final   Escherichia coli NOT DETECTED NOT DETECTED Final    Klebsiella oxytoca NOT DETECTED NOT DETECTED Final   Klebsiella pneumoniae NOT DETECTED NOT DETECTED Final   Proteus species NOT DETECTED NOT DETECTED Final   Serratia marcescens NOT DETECTED NOT DETECTED Final   Haemophilus influenzae NOT DETECTED NOT DETECTED Final   Neisseria meningitidis NOT DETECTED NOT DETECTED Final   Pseudomonas aeruginosa NOT DETECTED NOT DETECTED Final   Candida albicans NOT DETECTED NOT DETECTED Final   Candida glabrata NOT DETECTED NOT DETECTED Final   Candida krusei NOT DETECTED NOT DETECTED Final   Candida parapsilosis NOT DETECTED NOT DETECTED Final   Candida tropicalis NOT DETECTED NOT DETECTED Final    Comment: Performed at [facility], [place] [address] [place],  [address]  Culture, blood (routine x 2)     Status: None   Collection Time: 05/14/18  5:27 PM  Result Value Ref Range Status   Specimen Description BLOOD A-LINE DRAW  Final   Special Requests   Final    BOTTLES DRAWN AEROBIC AND ANAEROBIC Blood Culture adequate volume   Culture NO GROWTH 5 DAYS  Final   Report Status 05/20/2018 FINAL  Final  Aerobic/Anaerobic Culture (surgical/deep wound)     Status: None   Collection Time: 05/14/18  6:48 PM  Result Value Ref Range Status   Specimen Description PERITONEAL  Final   Special Requests NONE  Final   Gram Stain   Final    NO WBC SEEN  NO ORGANISMS SEEN Performed at [facility], [place] [address], [place], [place] [address]    Culture   Final    MODERATE ENTEROCOCCUS FAECIUM MODERATE CLOSTRIDIUM SPECIES    Report Status 05/20/2018 FINAL  Final   Organism ID, Bacteria ENTEROCOCCUS FAECIUM  Final      Susceptibility   Enterococcus faecium - MIC*    AMPICILLIN <=2 SENSITIVE Sensitive     VANCOMYCIN <=0.5 SENSITIVE Sensitive     GENTAMICIN SYNERGY SENSITIVE Sensitive     * MODERATE ENTEROCOCCUS FAECIUM  Urine culture     Status: None   Collection Time: 05/14/18  8:14 PM  Result Value Ref Range Status   Specimen Description  URINE, CATHETERIZED  Final   Special Requests NONE  Final   Culture   Final    NO GROWTH Performed at [facility], [place] [address], [place], [place] [address]    Report Status 05/16/2018 FINAL  Final  MRSA PCR Screening     Status: None   Collection Time: 05/14/18 10:35 PM  Result Value Ref Range Status   MRSA by PCR NEGATIVE NEGATIVE Final    Comment:        The GeneXpert MRSA Assay (FDA approved for NASAL specimens only), is one component of a comprehensive MRSA colonization surveillance program. It is not intended to diagnose MRSA infection nor to guide or monitor treatment for MRSA infections. Performed at [facility], [place] [address], [place], [place] [address]   Culture, blood (routine x 2)     Status: None   Collection Time: 05/16/18  1:50 AM  Result Value Ref Range Status   Specimen Description BLOOD RIGHT HAND  Final   Special Requests   Final    BOTTLES DRAWN AEROBIC ONLY Blood Culture adequate volume   Culture   Final    NO GROWTH 5 DAYS Performed at [facility], [place] [address], [place], [place] [address]    Report Status 05/21/2018 FINAL  Final  Culture, blood (routine x 2)     Status: None   Collection Time: 05/16/18  2:05 AM  Result Value Ref Range Status   Specimen Description BLOOD LEFT ANTECUBITAL  Final   Special Requests   Final    BOTTLES DRAWN AEROBIC ONLY Blood Culture results may not be optimal due to an inadequate volume of blood received in culture bottles   Culture   Final    NO GROWTH 5 DAYS Performed at [facility], [place] [address], [place], [place] [address]    Report Status 05/21/2018 FINAL  Final    RADIOLOGY STUDIES/RESULTS: Ct Head Wo Contrast  Result Date: 05/18/2018 CLINICAL DATA:  Altered level of consciousness. Patient following commands intermittently. EXAM: CT HEAD WITHOUT CONTRAST TECHNIQUE: Contiguous axial images were obtained from the base of the skull through the vertex without intravenous contrast.  COMPARISON:  None. FINDINGS: Brain: No evidence of acute infarction, hemorrhage, hydrocephalus, extra-axial collection or mass lesion/mass effect. Vascular: No hyperdense vessel or unexpected calcification. Skull: Normal. Negative for fracture or focal lesion. Sinuses/Orbits: Globes and orbits are unremarkable. There is mild mucosal thickening lining the ethmoid air cells and left sphenoid sinus with minimal right sphenoid sinus and maxillary sinus mucosal thickening. Other: None. IMPRESSION: 1. No intracranial abnormalities. 2. Sinus mucosal thickening. Electronically Signed   By: Lajean Manes M.D.   On: 05/18/2018 14:29   Dg Chest Port 1 View  Result Date: 05/17/2018 CLINICAL DATA:  Endotracheally intubated.  EXAM: PORTABLE CHEST 1 VIEW COMPARISON:  05/15/2018 FINDINGS: Endotracheal tube terminates 2.5 cm above the carina. Enteric tube courses into the abdomen with tip not imaged and side hole below the diaphragm. Right jugular catheter terminates near the superior cavoatrial junction. The cardiomediastinal silhouette is unchanged with normal heart size. Mild bibasilar opacities have slightly improved. No sizable pleural effusion or pneumothorax is identified. IMPRESSION: Decreased, mild bibasilar atelectasis. Electronically Signed   By: Logan Bores M.D.   On: 05/17/2018 07:14   Dg Chest Port 1 View  Result Date: 05/15/2018 CLINICAL DATA:  Ventilator support EXAM: PORTABLE CHEST 1 VIEW COMPARISON:  05/14/2018 FINDINGS: Endotracheal tube tip is 2 cm above the carina. Nasogastric tube enters the stomach. Right internal jugular central line tip is in the proximal right atrium. No pneumothorax. Mild bilateral lower lobe atelectasis. IMPRESSION: Lines and tubes well positioned. Mild basilar atelectasis. Electronically Signed   By: Nelson Chimes M.D.   On: 05/15/2018 07:41   Dg Chest Portable 1 View  Result Date: 05/14/2018 CLINICAL DATA:  Abdominal pain for several days EXAM: PORTABLE CHEST 1 VIEW  COMPARISON:  CT from earlier in the same day. FINDINGS: Elevation of the right hemidiaphragm is again seen. Cardiac shadow is within normal limits. Mild right basilar atelectatic changes are noted. No sizable effusion is seen. No bony abnormality is noted. IMPRESSION: Mild right basilar atelectasis. Electronically Signed   By: Inez Catalina M.D.   On: 05/14/2018 15:26   Ct Renal Stone Study  Result Date: 05/14/2018 CLINICAL DATA:  58 y/o F; nausea, vomiting, and abdominal pain for 3 days. EXAM: CT ABDOMEN AND PELVIS WITHOUT CONTRAST TECHNIQUE: Multidetector CT imaging of the abdomen and pelvis was performed following the standard protocol without IV contrast. COMPARISON:  None. FINDINGS: Lower chest: Platelike atelectasis in the lung bases. Hepatobiliary: No focal liver abnormality is seen. No gallstones, gallbladder wall thickening, or biliary dilatation. Pancreas: Unremarkable. No pancreatic ductal dilatation or surrounding inflammatory changes. Spleen: Normal in size without focal abnormality. Adrenals/Urinary Tract: Adrenal glands are unremarkable. Kidneys are normal, without renal calculi, focal lesion, or hydronephrosis. Bladder is unremarkable. Stomach/Bowel: Severe diffuse small-bowel obstruction with transition at the level of the distal ileum in the right lower quadrant (series 3, image 65). Mild sigmoid diverticulosis without findings of acute diverticulitis. Otherwise normal appearance of the colon. Vascular/Lymphatic: Aortic atherosclerosis. No enlarged abdominal or pelvic lymph nodes. Reproductive: Negative. Other: There are a few tiny foci of pneumoperitoneum at the liver hilum, anterior abdominal wall, and below the diaphragm (series 3, image 12, 24, 27). Small volume of ascites. Musculoskeletal: No fracture is seen. Lumbar spondylosis with prominent facet arthropathy. IMPRESSION: 1. Severe diffuse small bowel obstruction with transition level of distal ileum in right lower quadrant. 2. Few  punctate foci of pneumoperitoneum indicating small/micro perforation. Small volume of ascites. These results were called by telephone at the time of interpretation on 05/14/2018 at 3:27 pm to Dr. Andy Gauss, who verbally acknowledged these results. Electronically Signed   By: Kristine Garbe M.D.   On: 05/14/2018 15:34     LOS: 9 days   Oren Binet, MD  [facility]  If 7PM-7AM, please contact night-coverage  Please page via [email or web address] [email or web address]-click on MD name and type text message  05/23/2018, 1:05 PM

## 2018-05-23 NOTE — Progress Notes (Signed)
Inpatient Rehabilitation Admissions Coordinator  Patient currently on [facility]. NGT remains. I will continue to follow to assist with planning dispo when appropriate.  Danne [place], RN, MSN Rehab Admissions Coordinator [phone] 05/23/2018 11:00 AM

## 2018-05-24 ENCOUNTER — Inpatient Hospital Stay (HOSPITAL_COMMUNITY): Payer: 59

## 2018-05-24 LAB — COMPREHENSIVE METABOLIC PANEL
ALT: 22 U/L (ref 0–44)
AST: 35 U/L (ref 15–41)
Albumin: 2.1 g/dL — ABNORMAL LOW (ref 3.5–5.0)
Alkaline Phosphatase: 63 U/L (ref 38–126)
Anion gap: 13 (ref 5–15)
BUN: 18 mg/dL (ref 6–20)
CO2: 23 mmol/L (ref 22–32)
CREATININE: 0.77 mg/dL (ref 0.44–1.00)
Calcium: 8.7 mg/dL — ABNORMAL LOW (ref 8.9–10.3)
Chloride: 105 mmol/L (ref 98–111)
GFR calc Af Amer: >60 mL/min (ref >60)
GFR calc non Af Amer: >60 mL/min (ref >60)
GLUCOSE: 217 mg/dL — AB (ref 70–99)
Potassium: 3.8 mmol/L (ref 3.5–5.1)
Sodium: 141 mmol/L (ref 135–145)
Total Bilirubin: 0.3 mg/dL (ref 0.3–1.2)
Total Protein: 6 g/dL — ABNORMAL LOW (ref 6.5–8.1)

## 2018-05-24 LAB — GLUCOSE, CAPILLARY
GLUCOSE-CAPILLARY: 216 mg/dL — AB (ref 70–99)
Glucose-Capillary: 147 mg/dL — ABNORMAL HIGH (ref 70–99)
Glucose-Capillary: 161 mg/dL — ABNORMAL HIGH (ref 70–99)
Glucose-Capillary: 164 mg/dL — ABNORMAL HIGH (ref 70–99)
Glucose-Capillary: 165 mg/dL — ABNORMAL HIGH (ref 70–99)
Glucose-Capillary: 178 mg/dL — ABNORMAL HIGH (ref 70–99)
Glucose-Capillary: 99 mg/dL (ref 70–99)

## 2018-05-24 LAB — CBC
HEMATOCRIT: 30 % — AB (ref 36.0–46.0)
Hemoglobin: 9.5 g/dL — ABNORMAL LOW (ref 12.0–15.0)
MCH: 26.6 pg (ref 26.0–34.0)
MCHC: 31.7 g/dL (ref 30.0–36.0)
MCV: 84 fL (ref 80.0–100.0)
Platelets: 502 10*3/uL — ABNORMAL HIGH (ref 150–400)
RBC: 3.57 MIL/uL — ABNORMAL LOW (ref 3.87–5.11)
RDW: 16.8 % — ABNORMAL HIGH (ref 11.5–15.5)
WBC: 21 10*3/uL — ABNORMAL HIGH (ref 4.0–10.5)
nRBC: 0.2 % (ref 0.0–0.2)

## 2018-05-24 LAB — PHOSPHORUS: Phosphorus: 2.7 mg/dL (ref 2.5–4.6)

## 2018-05-24 LAB — MAGNESIUM: Magnesium: 1.9 mg/dL (ref 1.7–2.4)

## 2018-05-24 MED ORDER — ACETAMINOPHEN 325 MG PO TABS
650.0000 mg | ORAL_TABLET | Freq: Four times a day (QID) | ORAL | Status: DC | PRN
  Administered 2018-05-24 – 2018-05-30 (×6): 650 mg via ORAL
  Filled 2018-05-24 (×6): qty 2

## 2018-05-24 MED ORDER — IOHEXOL 300 MG/ML  SOLN
100.0000 mL | Freq: Once | INTRAMUSCULAR | Status: AC | PRN
  Administered 2018-05-24: 100 mL via INTRAVENOUS

## 2018-05-24 MED ORDER — SODIUM CHLORIDE 0.9% FLUSH
10.0000 mL | INTRAVENOUS | Status: DC | PRN
  Administered 2018-05-25 – 2018-06-01 (×3): 10 mL
  Filled 2018-05-24 (×3): qty 40

## 2018-05-24 MED ORDER — LACTATED RINGERS IV BOLUS
500.0000 mL | Freq: Once | INTRAVENOUS | Status: AC
  Administered 2018-05-24: 500 mL via INTRAVENOUS

## 2018-05-24 MED ORDER — TRAVASOL 10 % IV SOLN
INTRAVENOUS | Status: AC
  Administered 2018-05-24: 17:00:00 via INTRAVENOUS
  Filled 2018-05-24: qty 1274.4

## 2018-05-24 MED ORDER — LACTATED RINGERS IV BOLUS
1000.0000 mL | Freq: Once | INTRAVENOUS | Status: AC
  Administered 2018-05-24: 1000 mL via INTRAVENOUS

## 2018-05-24 NOTE — Progress Notes (Signed)
[facility] CONSULT NOTE   Pharmacy Consult for TPN Indication: ileus  Patient Measurements: Height: 5\' 3"  (160 cm) Weight: 256 lb 6.3 oz (116.3 kg) IBW/kg (Calculated) : 52.4 TPN AdjBW (KG): 68.8 Body mass index is 45.42 kg/m. Usual Weight: 117.9 kg  Assessment:  58 y/o female presenting to the ED with 3 days of vomiting and abdominal pain found to be in DKA. CT showed SBO. Noted SBO, pneumoperitoneum, intussusception with necrosis and performation and was taken to the OR emergently for ex lap, SBR left in discontinuity, and abthera VAC on 1/27. Taken back to the OR on 1/29 for ex lap with SB anastomosis and fascia closure. High risk for refeeding.  Husband reports patient eating well prior to experiencing GI distress.  GI: SBO, pneumoperitoneum, intussusception with necrosis and perforation. Patient reports abdominal pain with ice chips. Albumin 2.0, prealbumin improving 10.1>14.4, NG was displaced so it was taken out and surgery advanced to CLD. Overnight had some brown emesis and abdominal pain is worse. No flatus yet. Switching back to NPO. PPI IV Endo: hx DM, CBGs slightly better but still uncontrolled (160-210s) Insulin requirements in the past 24 hours: 25 units SSI, 30 units in TPN, and 24 units of Lantus Lytes: Lytes wnl today. Na down further to 141, Cl down to 114, K 3.8 (goal >= 4), Mag 1.9 (goal >=2), Phos 2.7.  Renal: AKI resolved, SCr stable, UOP low Pulm: extubated Cards: MAP 80-90s, tachy Hepatobil: LFT and T bili WNL, TG 147 Neuro: awake but drowsy ID: On meropenem for IAI, Tm 99.1, WBC 18.6. PLan stop date of 2/8  TPN Access: double lumen CVC placed 05/14/18 TPN start date: 05/19/18  Nutritional Goals (per RD recommendation on 05/19/18): KCal: 2100-2300 Protein: 116-128 g Fluid: >1.6L  Goal TPN rate is 90 ml/hr   Current Nutrition:  TPN NPO  Plan:  Continue TPN at 39mL/hr This TPN provides 127 g of protein, 302 g of  dextrose, and 58 g of lipids which provides 2,120 kCals per day, meeting 100% of patient needs Electrolytes in TPN: Increase Na to 82mEq/L, continue increased K and Phos, Cl:Ac 1:2 Add MVI, trace elements to TPN Increase to 40 units of regular insulin in TPN Continue resistant SSI and Lantus 24 units per MD Monitor TPN labs   Elenor Quinones, PharmD, BCPS, [facility] Clinical Pharmacist Phone number [phone] 05/24/2018 8:01 AM

## 2018-05-24 NOTE — Progress Notes (Signed)
CT scan with persistent dilated loops of small bowel, suggestive of SBO. If patient develops emesis, recommend reinserting NG tube.

## 2018-05-24 NOTE — Progress Notes (Signed)
PROGRESS NOTE        PATIENT DETAILS Name: Wendy [name] Age: 58 y.o. Sex: female Date of Birth: [birth year] Admit Date: 05/14/2018 Admitting Physician Rush Farmer, MD [id number], Drue Stager, MD  Brief Narrative: Patient is a 58 y.o. female with history of DM-2, pretension, GERD, morbid obesity who presented with a 3-day history of vomiting and abdominal pain-found to have sepsis secondary to small bowel obstruction and small perforation, along with DKA.  She was started on IV insulin, empiric antimicrobial therapy-General surgery was consulted-and patient was admitted to the ICU under the PCCM service.  Significant Events: 1/27 Admit > OR for ex lap with small bowel resection> ICU 1/29 OR for anastomosis and closure 2/1 Extubated 2/2 Hypoactive delirium, worsening respiratory status  Subjective: Apparently had BM yesterday.  Denies any nausea vomiting.  Assessment/Plan: Acute hypoxic respiratory failure: Extubated on 2/1 (intubated for surgery) continue to wean to RA as able.  Septic shock secondary to peritonitis with bowel obstruction along with Clostridium bacteremia: Sepsis pathophysiology has resolved, blood cultures positive for Clostridium species.  Peritoneal fluid cultures positive for enterococcus and Clostridium as well.  Plans are to continue meropenem-stop date of 2/8.    Small bowel obstruction with perforation s/p resection and closure of the abdominal fascia: Postop course prolonged and complicated by ileus-on TNA-due to worsening leukocytosis-a CT of the abdomen has been ordered.  NG tube was discontinued inadvertently by patient yesterday.  Await further recommendations from general surgery.  Anemia: Suspicion for anemia secondary to acute/critical illness-no evidence of blood loss.  Hemoglobin stable-follow periodically.   Hypernatremia: Resolved-continue gentle hydration with D5W-until TNA started this evening.    Type II DM with  uncontrolled hyperglycemia: Had severe hyperglycemia with probable DKA on admission-CBGs relatively stable-continue Lantus 24 units daily and SSI.  Follow and optimize accordingly.   Hypertension: Blood pressure relatively well controlled-continue to monitor off antihypertensives-once oral intake has resumed we can resume her blood pressure medications.    GERD: Continue PPI  Dyslipidemia: Resume statin when oral intake is further stabilized  Debility/deconditioning: Secondary to acute illness/critical illness-CIR when more stable over the next few days.  Other: Foley catheter discontinued on 2/5, right IJ catheter discontinued on 2/6-PICC line placed on 2/6.   DVT Prophylaxis: Prophylactic Lovenox   Code Status: Full code   Family Communication: Spouse at bedside  Disposition Plan: Remain inpatient-suspect CIR on discharge  Antimicrobial agents: Anti-infectives (From admission, onward)   Start     Dose/Rate Route Frequency Ordered Stop   05/19/18 1400  vancomycin (VANCOCIN) 1,250 mg in sodium chloride 0.9 % 250 mL IVPB  Status:  Discontinued     1,250 mg 166.7 mL/hr over 90 Minutes Intravenous Every 24 hours 05/18/18 1343 05/19/18 1244   05/19/18 1400  meropenem (MERREM) 2 g in sodium chloride 0.9 % 100 mL IVPB     2 g 200 mL/hr over 30 Minutes Intravenous Every 8 hours 05/19/18 1244 05/26/18 2359   05/18/18 1600  cefTRIAXone (ROCEPHIN) 2 g in sodium chloride 0.9 % 100 mL IVPB  Status:  Discontinued     2 g 200 mL/hr over 30 Minutes Intravenous Every 24 hours 05/18/18 1326 05/19/18 1244   05/18/18 1400  vancomycin (VANCOCIN) 2,000 mg in sodium chloride 0.9 % 500 mL IVPB     2,000 mg 250 mL/hr over 120 Minutes Intravenous  Once 05/18/18  1343 05/18/18 1640   05/15/18 2333  ceFEPIme (MAXIPIME) 2 g in sodium chloride 0.9 % 100 mL IVPB  Status:  Discontinued     2 g 200 mL/hr over 30 Minutes Intravenous Every 8 hours 05/15/18 2226 05/18/18 1326   05/15/18 1500  ceFEPIme  (MAXIPIME) 1 g in sodium chloride 0.9 % 100 mL IVPB  Status:  Discontinued     1 g 200 mL/hr over 30 Minutes Intravenous Every 24 hours 05/14/18 1512 05/15/18 2226   05/15/18 0000  anidulafungin (ERAXIS) 100 mg in sodium chloride 0.9 % 100 mL IVPB  Status:  Discontinued     100 mg 78 mL/hr over 100 Minutes Intravenous Every 24 hours 05/14/18 1728 05/15/18 1159   05/14/18 1800  anidulafungin (ERAXIS) 200 mg in sodium chloride 0.9 % 200 mL IVPB     200 mg 78 mL/hr over 200 Minutes Intravenous  Once 05/14/18 1751 05/14/18 2138   05/14/18 1745  vancomycin (VANCOCIN) 2,000 mg in sodium chloride 0.9 % 500 mL IVPB  Status:  Discontinued     2,000 mg 250 mL/hr over 120 Minutes Intravenous  Once 05/14/18 1741 05/15/18 1159   05/14/18 1741  vancomycin variable dose per unstable renal function (pharmacist dosing)  Status:  Discontinued      Does not apply See admin instructions 05/14/18 1741 05/15/18 1159   05/14/18 1515  ceFEPIme (MAXIPIME) 2 g in sodium chloride 0.9 % 100 mL IVPB     2 g 200 mL/hr over 30 Minutes Intravenous  Once 05/14/18 1512 05/14/18 1649   05/14/18 1515  metroNIDAZOLE (FLAGYL) IVPB 500 mg  Status:  Discontinued     500 mg 100 mL/hr over 60 Minutes Intravenous Every 8 hours 05/14/18 1512 05/19/18 1244      Procedures: 2/6>>PICC line 1/29>> exploratory laparotomy with small bowel anastomosis and closure of abdominal fascia 1/27>>Right IJ (Anesthesia) 1/27>>EXPLORATORY LAPAROTOMY,SMALL BOWEL RESECTION,CLOSURE WITH ABTHERA VAC  CONSULTS:  pulmonary/intensive care and general surgery  Time spent: 25- minutes-Greater than 50% of this time was spent in counseling, explanation of diagnosis, planning of further management, and coordination of care.  MEDICATIONS: Scheduled Meds: . chlorhexidine  15 mL Mouth Rinse BID  . Chlorhexidine Gluconate Cloth  6 each Topical Daily  . enoxaparin (LOVENOX) injection  40 mg Subcutaneous Q24H  . fluticasone  2 spray Each Nare Daily  .  insulin aspart  0-20 Units Subcutaneous Q4H  . insulin glargine  24 Units Subcutaneous Daily  . mouth rinse  15 mL Mouth Rinse BID  . pantoprazole (PROTONIX) IV  40 mg Intravenous Q24H  . sodium chloride flush  10-40 mL Intracatheter Q12H  . venlafaxine XR  150 mg Oral Daily   Continuous Infusions: . dextrose 50 mL/hr at 05/24/18 0324  . meropenem (MERREM) IV 2 g (05/24/18 0602)  . TPN ADULT (ION) Stopped (05/24/18 1100)  . TPN ADULT (ION)     PRN Meds:.acetaminophen, fentaNYL (SUBLIMAZE) injection, lip balm, sodium chloride flush, sodium chloride flush   PHYSICAL EXAM: Vital signs: Vitals:   05/23/18 0417 05/23/18 0652 05/23/18 1347 05/24/18 0511  BP: (!) 160/83 (!) 142/77 (!) 160/88 (!) 148/82  Pulse: 85 86 96 (!) 107  Resp: 17  (!) 21 18  Temp: 98.4 F (36.9 C)  99 F (37.2 C) 97.7 F (36.5 C)  TempSrc: Oral  Oral   SpO2: 100%  95% 95%  Weight:      Height:       Filed Weights   05/20/18 0500 05/21/18  0500 05/22/18 0454  Weight: 114.5 kg 116 kg 116.3 kg   Body mass index is 45.42 kg/m.   General appearance:Awake, alert, not in any distress.  Eyes:no scleral icterus. HEENT: Atraumatic and Normocephalic Neck: supple, no JVD. Resp:Good air entry bilaterally,no rales or rhonchi CVS: S1 S2 regular, no murmurs.  GI: Bowel sounds appears sluggish, appropriately tender at the incision site-minimal distention present.  ebound. Extremities: B/L Lower Ext shows no edema, both legs are warm to touch Neurology:  Non focal Psychiatric: Normal judgment and insight. Normal mood. Musculoskeletal:No digital cyanosis Skin:No Rash, warm and dry Wounds:N/A  I have personally reviewed following labs and imaging studies  LABORATORY DATA: CBC: Recent Labs  Lab 05/20/18 0334 05/20/18 0913 05/21/18 0644 05/22/18 1549 05/23/18 0332 05/24/18 0515  WBC 13.4*  --  18.0* 17.0* 18.6* 21.0*  NEUTROABS 8.5*  --  12.7*  --   --   --   HGB 8.3* 16.0* 8.3* 10.8* 8.7* 9.5*  HCT 26.5*  47.0* 25.3* 33.0* 27.9* 30.0*  MCV 81.5  --  81.6 80.7 85.6 84.0  PLT 423*  --  450* 259 468* 502*    Basic Metabolic Panel: Recent Labs  Lab 05/19/18 0521 05/20/18 0334  05/20/18 2301 05/21/18 0644 05/21/18 1453 05/21/18 2053 05/22/18 0500 05/23/18 0332 05/24/18 0515  NA 150* 152*   < >  --  155* 155* 152* 153* 149* 141  K 3.6 3.4*   < > 5.2* 3.7 3.7 4.5 3.6 4.0 3.8  CL 119* 120*  --   --  121* 120* 119* 118* 114* 105  CO2 19* 22  --   --  25 25 23 29 30 23   GLUCOSE 107* 214*  --   --  301* 149* 241* 238* 213* 217*  BUN 20 18  --   --  17 15 16 14 15 18   CREATININE 1.09* 0.80  --   --  0.79 0.74 0.78 0.77 0.72 0.77  CALCIUM 8.4* 8.3*  --   --  8.2* 8.5* 8.4* 8.3* 8.5* 8.7*  MG 2.3 2.2  --   --  2.1  --   --  2.0  --  1.9  PHOS 2.0* 1.3*  --  2.1* 2.1*  --   --  2.9  --  2.7   < > = values in this interval not displayed.    GFR: Estimated Creatinine Clearance: 94.4 mL/min (by C-G formula based on SCr of 0.77 mg/dL).  Liver Function Tests: Recent Labs  Lab 05/20/18 0334 05/21/18 0644 05/21/18 1453 05/24/18 0515  AST 12* 14* 20 35  ALT 21 17 18 22   ALKPHOS 51 50 58 63  BILITOT 0.2* 0.4 0.3 0.3  PROT 5.0* 5.0* 5.7* 6.0*  ALBUMIN 1.8* 1.8* 2.0* 2.1*   No results for input(s): LIPASE, AMYLASE in the last 168 hours. No results for input(s): AMMONIA in the last 168 hours.  Coagulation Profile: No results for input(s): INR, PROTIME in the last 168 hours.  Cardiac Enzymes: No results for input(s): CKTOTAL, CKMB, CKMBINDEX, TROPONINI in the last 168 hours.  BNP (last 3 results) No results for input(s): PROBNP in the last 8760 hours.  HbA1C: No results for input(s): HGBA1C in the last 72 hours.  CBG: Recent Labs  Lab 05/24/18 0009 05/24/18 0118 05/24/18 0459 05/24/18 0738 05/24/18 1155  GLUCAP 164* 161* 178* 216* 165*    Lipid Profile: No results for input(s): CHOL, HDL, LDLCALC, TRIG, CHOLHDL, LDLDIRECT in the last 72 hours.  Thyroid  Function  Tests: No results for input(s): TSH, T4TOTAL, FREET4, T3FREE, THYROIDAB in the last 72 hours.  Anemia Panel: No results for input(s): VITAMINB12, FOLATE, FERRITIN, TIBC, IRON, RETICCTPCT in the last 72 hours.  Urine analysis:    Component Value Date/Time   COLORURINE YELLOW 05/14/2018 2014   APPEARANCEUR CLOUDY (A) 05/14/2018 2014   LABSPEC 1.023 05/14/2018 2014   PHURINE 5.0 05/14/2018 2014   GLUCOSEU >=500 (A) 05/14/2018 2014   HGBUR MODERATE (A) 05/14/2018 2014   BILIRUBINUR NEGATIVE 05/14/2018 2014   KETONESUR 20 (A) 05/14/2018 2014   PROTEINUR 100 (A) 05/14/2018 2014   NITRITE NEGATIVE 05/14/2018 2014   LEUKOCYTESUR TRACE (A) 05/14/2018 2014    Sepsis Labs: Lactic Acid, Venous    Component Value Date/Time   LATICACIDVEN 1.3 05/14/2018 2223    MICROBIOLOGY: Recent Results (from the past 240 hour(s))  Blood culture (routine x 2)     Status: Abnormal   Collection Time: 05/14/18  4:55 PM  Result Value Ref Range Status   Specimen Description BLOOD RIGHT ARM  Final   Special Requests   Final    BOTTLES DRAWN AEROBIC AND ANAEROBIC Blood Culture adequate volume   Culture  Setup Time (A)  Final    GRAM VARIABLE ROD ANAEROBIC BOTTLE ONLY CRITICAL RESULT CALLED TO, READ BACK BY AND VERIFIED WITHDenton Brick PHARMD [id number] 05/18/18 A BROWNING Performed at [facility], [place] [address], [place], [place] [address]    Culture CLOSTRIDIUM SORDELLII (A)  Final   Report Status 05/22/2018 FINAL  Final  Blood Culture ID Panel (Reflexed)     Status: None   Collection Time: 05/14/18  4:55 PM  Result Value Ref Range Status   Enterococcus species NOT DETECTED NOT DETECTED Final   Listeria monocytogenes NOT DETECTED NOT DETECTED Final   Staphylococcus species NOT DETECTED NOT DETECTED Final   Staphylococcus aureus (BCID) NOT DETECTED NOT DETECTED Final   Streptococcus species NOT DETECTED NOT DETECTED Final   Streptococcus agalactiae NOT DETECTED NOT DETECTED Final   Streptococcus  pneumoniae NOT DETECTED NOT DETECTED Final   Streptococcus pyogenes NOT DETECTED NOT DETECTED Final   Acinetobacter baumannii NOT DETECTED NOT DETECTED Final   Enterobacteriaceae species NOT DETECTED NOT DETECTED Final   Enterobacter cloacae complex NOT DETECTED NOT DETECTED Final   Escherichia coli NOT DETECTED NOT DETECTED Final   Klebsiella oxytoca NOT DETECTED NOT DETECTED Final   Klebsiella pneumoniae NOT DETECTED NOT DETECTED Final   Proteus species NOT DETECTED NOT DETECTED Final   Serratia marcescens NOT DETECTED NOT DETECTED Final   Haemophilus influenzae NOT DETECTED NOT DETECTED Final   Neisseria meningitidis NOT DETECTED NOT DETECTED Final   Pseudomonas aeruginosa NOT DETECTED NOT DETECTED Final   Candida albicans NOT DETECTED NOT DETECTED Final   Candida glabrata NOT DETECTED NOT DETECTED Final   Candida krusei NOT DETECTED NOT DETECTED Final   Candida parapsilosis NOT DETECTED NOT DETECTED Final   Candida tropicalis NOT DETECTED NOT DETECTED Final    Comment: Performed at [facility], [place] [address] [place], [place] [address]  Culture, blood (routine x 2)     Status: None   Collection Time: 05/14/18  5:27 PM  Result Value Ref Range Status   Specimen Description BLOOD A-LINE DRAW  Final   Special Requests   Final    BOTTLES DRAWN AEROBIC AND ANAEROBIC Blood Culture adequate volume   Culture NO GROWTH 5 DAYS  Final   Report Status 05/20/2018 FINAL  Final  Aerobic/Anaerobic Culture (surgical/deep wound)     Status: None   Collection Time: 05/14/18  6:48 PM  Result Value Ref Range Status   Specimen Description PERITONEAL  Final   Special Requests NONE  Final   Gram Stain   Final    NO WBC SEEN NO ORGANISMS SEEN Performed at [facility], [address], [place], [place] [address]    Culture   Final    MODERATE ENTEROCOCCUS FAECIUM MODERATE CLOSTRIDIUM SPECIES    Report Status 05/20/2018 FINAL  Final   Organism ID, Bacteria ENTEROCOCCUS FAECIUM  Final        Susceptibility   Enterococcus faecium - MIC*    AMPICILLIN <=2 SENSITIVE Sensitive     VANCOMYCIN <=0.5 SENSITIVE Sensitive     GENTAMICIN SYNERGY SENSITIVE Sensitive     * MODERATE ENTEROCOCCUS FAECIUM  Urine culture     Status: None   Collection Time: 05/14/18  8:14 PM  Result Value Ref Range Status   Specimen Description URINE, CATHETERIZED  Final   Special Requests NONE  Final   Culture   Final    NO GROWTH Performed at [facility], [place] [address] [place], [place] [address]    Report Status 05/16/2018 FINAL  Final  MRSA PCR Screening     Status: None   Collection Time: 05/14/18 10:35 PM  Result Value Ref Range Status   MRSA by PCR NEGATIVE NEGATIVE Final    Comment:        The GeneXpert MRSA Assay (FDA approved for NASAL specimens only), is one component of a comprehensive MRSA colonization surveillance program. It is not intended to diagnose MRSA infection nor to guide or monitor treatment for MRSA infections. Performed at [facility], [place] [address] [place], [place] [address]   Culture, blood (routine x 2)     Status: None   Collection Time: 05/16/18  1:50 AM  Result Value Ref Range Status   Specimen Description BLOOD RIGHT HAND  Final   Special Requests   Final    BOTTLES DRAWN AEROBIC ONLY Blood Culture adequate volume   Culture   Final    NO GROWTH 5 DAYS Performed at [facility], [place] [address], [place], [place] [address]    Report Status 05/21/2018 FINAL  Final  Culture, blood (routine x 2)     Status: None   Collection Time: 05/16/18  2:05 AM  Result Value Ref Range Status   Specimen Description BLOOD LEFT ANTECUBITAL  Final   Special Requests   Final    BOTTLES DRAWN AEROBIC ONLY Blood Culture results may not be optimal due to an inadequate volume of blood received in culture bottles   Culture   Final    NO GROWTH 5 DAYS Performed at [facility], [place] [address], [place], [place] [address]    Report Status 05/21/2018  FINAL  Final    RADIOLOGY STUDIES/RESULTS: Ct Head Wo Contrast  Result Date: 05/18/2018 CLINICAL DATA:  Altered level of consciousness. Patient following commands intermittently. EXAM: CT HEAD WITHOUT CONTRAST TECHNIQUE: Contiguous axial images were obtained from the base of the skull through the vertex without intravenous contrast. COMPARISON:  None. FINDINGS: Brain: No evidence of acute infarction, hemorrhage, hydrocephalus, extra-axial collection or mass lesion/mass effect. Vascular: No hyperdense vessel or unexpected calcification. Skull: Normal. Negative for fracture or focal lesion. Sinuses/Orbits: Globes and orbits are unremarkable. There is mild mucosal thickening lining the ethmoid air cells and left sphenoid  sinus with minimal right sphenoid sinus and maxillary sinus mucosal thickening. Other: None. IMPRESSION: 1. No intracranial abnormalities. 2. Sinus mucosal thickening. Electronically Signed   By: Lajean Manes M.D.   On: 05/18/2018 14:29   Dg Chest Port 1 View  Result Date: 05/17/2018 CLINICAL DATA:  Endotracheally intubated. EXAM: PORTABLE CHEST 1 VIEW COMPARISON:  05/15/2018 FINDINGS: Endotracheal tube terminates 2.5 cm above the carina. Enteric tube courses into the abdomen with tip not imaged and side hole below the diaphragm. Right jugular catheter terminates near the superior cavoatrial junction. The cardiomediastinal silhouette is unchanged with normal heart size. Mild bibasilar opacities have slightly improved. No sizable pleural effusion or pneumothorax is identified. IMPRESSION: Decreased, mild bibasilar atelectasis. Electronically Signed   By: Logan Bores M.D.   On: 05/17/2018 07:14   Dg Chest Port 1 View  Result Date: 05/15/2018 CLINICAL DATA:  Ventilator support EXAM: PORTABLE CHEST 1 VIEW COMPARISON:  05/14/2018 FINDINGS: Endotracheal tube tip is 2 cm above the carina. Nasogastric tube enters the stomach. Right internal jugular central line tip is in the proximal right  atrium. No pneumothorax. Mild bilateral lower lobe atelectasis. IMPRESSION: Lines and tubes well positioned. Mild basilar atelectasis. Electronically Signed   By: Nelson Chimes M.D.   On: 05/15/2018 07:41   Dg Chest Portable 1 View  Result Date: 05/14/2018 CLINICAL DATA:  Abdominal pain for several days EXAM: PORTABLE CHEST 1 VIEW COMPARISON:  CT from earlier in the same day. FINDINGS: Elevation of the right hemidiaphragm is again seen. Cardiac shadow is within normal limits. Mild right basilar atelectatic changes are noted. No sizable effusion is seen. No bony abnormality is noted. IMPRESSION: Mild right basilar atelectasis. Electronically Signed   By: Inez Catalina M.D.   On: 05/14/2018 15:26   Dg Abd Portable 1v  Result Date: 05/24/2018 CLINICAL DATA:  Initial evaluation for acute vomiting, small bowel obstruction. EXAM: PORTABLE ABDOMEN - 1 VIEW COMPARISON:  Prior CT from 05/14/2018. FINDINGS: Multiple dilated gas-filled loops of small bowel seen within the left and mid abdomen, compatible with ongoing small bowel obstruction. Loops dilated up to approximately 4 cm in diameter. Paucity of gas distally. No soft tissue mass or abnormal calcification. No appreciable free air on these limited supine views of the abdomen. IMPRESSION: Multiple persistent gas-filled dilated loops of small bowel within the left and mid abdomen, compatible with ongoing small bowel obstruction. Electronically Signed   By: Jeannine Boga M.D.   On: 05/24/2018 06:00   Ct Renal Stone Study  Result Date: 05/14/2018 CLINICAL DATA:  58 y/o F; nausea, vomiting, and abdominal pain for 3 days. EXAM: CT ABDOMEN AND PELVIS WITHOUT CONTRAST TECHNIQUE: Multidetector CT imaging of the abdomen and pelvis was performed following the standard protocol without IV contrast. COMPARISON:  None. FINDINGS: Lower chest: Platelike atelectasis in the lung bases. Hepatobiliary: No focal liver abnormality is seen. No gallstones, gallbladder wall  thickening, or biliary dilatation. Pancreas: Unremarkable. No pancreatic ductal dilatation or surrounding inflammatory changes. Spleen: Normal in size without focal abnormality. Adrenals/Urinary Tract: Adrenal glands are unremarkable. Kidneys are normal, without renal calculi, focal lesion, or hydronephrosis. Bladder is unremarkable. Stomach/Bowel: Severe diffuse small-bowel obstruction with transition at the level of the distal ileum in the right lower quadrant (series 3, image 65). Mild sigmoid diverticulosis without findings of acute diverticulitis. Otherwise normal appearance of the colon. Vascular/Lymphatic: Aortic atherosclerosis. No enlarged abdominal or pelvic lymph nodes. Reproductive: Negative. Other: There are a few tiny foci of pneumoperitoneum at the liver hilum, anterior abdominal  wall, and below the diaphragm (series 3, image 12, 24, 27). Small volume of ascites. Musculoskeletal: No fracture is seen. Lumbar spondylosis with prominent facet arthropathy. IMPRESSION: 1. Severe diffuse small bowel obstruction with transition level of distal ileum in right lower quadrant. 2. Few punctate foci of pneumoperitoneum indicating small/micro perforation. Small volume of ascites. These results were called by telephone at the time of interpretation on 05/14/2018 at 3:27 pm to Dr. Andy Gauss, who verbally acknowledged these results. Electronically Signed   By: Kristine Garbe M.D.   On: 05/14/2018 15:34   [place] Ekg Site Rite  Result Date: 05/23/2018 If Site Rite image not attached, placement could not be confirmed due to current cardiac rhythm.    LOS: 10 days   Oren Binet, MD  [facility]  If 7PM-7AM, please contact night-coverage  Please page via [email or web address]-Password [email or web address]-click on MD name and type text message  05/24/2018, 12:25 PM

## 2018-05-24 NOTE — Progress Notes (Signed)
Dr. Baltazar Najjar paged. Pt is still unable to urinate since catheter taken out and had 566ml in bladder

## 2018-05-24 NOTE — Progress Notes (Signed)
[facility]/Trauma Progress Note  8 Days Post-Op   Assessment/Plan DKA- per medicine, improving  Sepsis- per primary SBO,Pneumoperitoneum, intussusception with necrosis & perforation - S/P ex lap, small bowel resection, closure with abthera vac, Dr. Grandville Silos, 01/17 -S/PExploratorylaparotomy with small bowel anastomosis and closure of abdominal fascia, Dr. Brantley Stage, 01/29 - NGT came out 02/05  - WBC trending up and abdominal pain worse, CT pending - brown emesis overnight, AXR showed SBO  FEN:NPO, TNA VTE: SCD's, started lovenox 01/30 QV:[id number], Vanc 01/27; 01/31>>,Cefepime &Flagyl 01/27>>afebrile, blood cultures 01/29NGTD, peritoneal fluid cultures showed Enterococcus and Clostridium, WBC 21 on 02/06 Foley:DC 02/05 Follow up:TBD  DISPO:CT scan pending in setting of elevated WBC and SBO seen on AXR this am. Trend WBC.    LOS: 10 days    Subjective: CC: abdominal pain  Pt states pain feels similar to before she came to hospital. Pain is worse today than yesterday. She had brown emesis this am. Two small BM's overnight. Husband at bedside.   Objective: Vital signs in last 24 hours: Temp:  [97.7 F (36.5 C)-99 F (37.2 C)] 97.7 F (36.5 C) (02/06 0511) Pulse Rate:  [96-107] 107 (02/06 0511) Resp:  [18-21] 18 (02/06 0511) BP: (148-160)/(82-88) 148/82 (02/06 0511) SpO2:  [95 %] 95 % (02/06 0511) Last BM Date: (pTA)  Intake/Output from previous day: 02/05 0701 - 02/06 0700 In: 1913.3 [P.O.:100; I.V.:1163.3; NG/GT:150; IV Piggyback:500] Out: 350 [Urine:350] Intake/Output this shift: No intake/output data recorded.  PE: Gen: Alert, NAD, sitting on edge of bed Pulm:rate and effort normal Abd: Soft,ND, fewBS appreciated, vac in place of midline, moderate generalized TTP with guarding, no peritonitis  Skin: no rashes noted, warm and dry  Anti-infectives: Anti-infectives (From admission, onward)   Start     Dose/Rate Route  Frequency Ordered Stop   05/19/18 1400  vancomycin (VANCOCIN) 1,250 mg in sodium chloride 0.9 % 250 mL IVPB  Status:  Discontinued     1,250 mg 166.7 mL/hr over 90 Minutes Intravenous Every 24 hours 05/18/18 1343 05/19/18 1244   05/19/18 1400  meropenem (MERREM) 2 g in sodium chloride 0.9 % 100 mL IVPB     2 g 200 mL/hr over 30 Minutes Intravenous Every 8 hours 05/19/18 1244 05/26/18 2359   05/18/18 1600  cefTRIAXone (ROCEPHIN) 2 g in sodium chloride 0.9 % 100 mL IVPB  Status:  Discontinued     2 g 200 mL/hr over 30 Minutes Intravenous Every 24 hours 05/18/18 1326 05/19/18 1244   05/18/18 1400  vancomycin (VANCOCIN) 2,000 mg in sodium chloride 0.9 % 500 mL IVPB     2,000 mg 250 mL/hr over 120 Minutes Intravenous  Once 05/18/18 1343 05/18/18 1640   05/15/18 2333  ceFEPIme (MAXIPIME) 2 g in sodium chloride 0.9 % 100 mL IVPB  Status:  Discontinued     2 g 200 mL/hr over 30 Minutes Intravenous Every 8 hours 05/15/18 2226 05/18/18 1326   05/15/18 1500  ceFEPIme (MAXIPIME) 1 g in sodium chloride 0.9 % 100 mL IVPB  Status:  Discontinued     1 g 200 mL/hr over 30 Minutes Intravenous Every 24 hours 05/14/18 1512 05/15/18 2226   05/15/18 0000  anidulafungin (ERAXIS) 100 mg in sodium chloride 0.9 % 100 mL IVPB  Status:  Discontinued     100 mg 78 mL/hr over 100 Minutes Intravenous Every 24 hours 05/14/18 1728 05/15/18 1159   05/14/18 1800  anidulafungin (ERAXIS) 200 mg in sodium chloride 0.9 % 200 mL IVPB  200 mg 78 mL/hr over 200 Minutes Intravenous  Once 05/14/18 1751 05/14/18 2138   05/14/18 1745  vancomycin (VANCOCIN) 2,000 mg in sodium chloride 0.9 % 500 mL IVPB  Status:  Discontinued     2,000 mg 250 mL/hr over 120 Minutes Intravenous  Once 05/14/18 1741 05/15/18 1159   05/14/18 1741  vancomycin variable dose per unstable renal function (pharmacist dosing)  Status:  Discontinued      Does not apply See admin instructions 05/14/18 1741 05/15/18 1159   05/14/18 1515  ceFEPIme (MAXIPIME) 2 g  in sodium chloride 0.9 % 100 mL IVPB     2 g 200 mL/hr over 30 Minutes Intravenous  Once 05/14/18 1512 05/14/18 1649   05/14/18 1515  metroNIDAZOLE (FLAGYL) IVPB 500 mg  Status:  Discontinued     500 mg 100 mL/hr over 60 Minutes Intravenous Every 8 hours 05/14/18 1512 05/19/18 1244      Lab Results:  Recent Labs    05/23/18 0332 05/24/18 0515  WBC 18.6* 21.0*  HGB 8.7* 9.5*  HCT 27.9* 30.0*  PLT 468* 502*   BMET Recent Labs    05/23/18 0332 05/24/18 0515  NA 149* 141  K 4.0 3.8  CL 114* 105  CO2 30 23  GLUCOSE 213* 217*  BUN 15 18  CREATININE 0.72 0.77  CALCIUM 8.5* 8.7*   PT/INR No results for input(s): LABPROT, INR in the last 72 hours. CMP     Component Value Date/Time   NA 141 05/24/2018 0515   NA 144 12/25/2014 1536   K 3.8 05/24/2018 0515   CL 105 05/24/2018 0515   CO2 23 05/24/2018 0515   GLUCOSE 217 (H) 05/24/2018 0515   BUN 18 05/24/2018 0515   BUN 9 12/25/2014 1536   CREATININE 0.77 05/24/2018 0515   CREATININE 0.69 05/25/2017 1150   CALCIUM 8.7 (L) 05/24/2018 0515   PROT 6.0 (L) 05/24/2018 0515   PROT 6.7 12/25/2014 1536   ALBUMIN 2.1 (L) 05/24/2018 0515   ALBUMIN 4.4 12/25/2014 1536   AST 35 05/24/2018 0515   ALT 22 05/24/2018 0515   ALKPHOS 63 05/24/2018 0515   BILITOT 0.3 05/24/2018 0515   BILITOT 0.3 12/25/2014 1536   GFRNONAA >60 05/24/2018 0515   GFRNONAA 78 08/25/2016 1100   GFRAA >60 05/24/2018 0515   GFRAA >89 08/25/2016 1100   Lipase     Component Value Date/Time   LIPASE 37 05/14/2018 2014    Studies/Results: Dg Abd Portable 1v  Result Date: 05/24/2018 CLINICAL DATA:  Initial evaluation for acute vomiting, small bowel obstruction. EXAM: PORTABLE ABDOMEN - 1 VIEW COMPARISON:  Prior CT from 05/14/2018. FINDINGS: Multiple dilated gas-filled loops of small bowel seen within the left and mid abdomen, compatible with ongoing small bowel obstruction. Loops dilated up to approximately 4 cm in diameter. Paucity of gas distally. No  soft tissue mass or abnormal calcification. No appreciable free air on these limited supine views of the abdomen. IMPRESSION: Multiple persistent gas-filled dilated loops of small bowel within the left and mid abdomen, compatible with ongoing small bowel obstruction. Electronically Signed   By: Jeannine Boga M.D.   On: 05/24/2018 06:00   [place] Ekg Site Rite  Result Date: 05/23/2018 If Site Rite image not attached, placement could not be confirmed due to current cardiac rhythm.     Kalman Drape , [facility] 05/24/2018, 8:18 AM  Pager: [phone] Mon-Wed, Friday 7:00am-4:30pm Thurs 7am-11:30am  Consults: [phone]

## 2018-05-24 NOTE — Progress Notes (Signed)
Nutrition Follow-up  DOCUMENTATION CODES:   Morbid obesity  INTERVENTION:   -TPN management per pharmacy -RD will follow for diet advancement and supplement as appropriate  NUTRITION DIAGNOSIS:   Inadequate oral intake related to altered GI function as evidenced by NPO status.  Ongoing  GOAL:   Patient will meet greater than or equal to 90% of their needs  Met with TPN  MONITOR:   Diet advancement, Labs, Weight trends, Skin, I & O's, Other (Comment)(TPN)  REASON FOR ASSESSMENT:   Ventilator    ASSESSMENT:   58 yo female admitted with DKA, SBO with intussusception with necrosis and perforation s/p SB resection left in discontinuity on 1/27 with SB anastomosis and closure of abdominal fascia on 1/29. On vent postop. PMH includes HTN, DM, GERD, HLD  1/27 Ex Lap, SB resection, left in discontinuity, closure with abthera vac. Vent post-op (81cm small bowel removed ~20 cm from the ileocecal valve) 1/29- S/PExploratorylaparotomy with small bowel anastomosis and closure of abdominal fascia, Dr. Brantley Stage 2/1- extubated, PICC placed, TPN initiated 2/4- trasnferred from ICU to floor 2/5- NGT clamped and removed  Reviewed I/O's: +1.6 L x 24 hours and +3 L since admission  Per general surgery notes, abdominal pain is worse today and pt with brown emesis overnight. Plan for CT today.  Pt remains NPO. Plan to continue TPN at 90 ml/hr, which provides 2120 kcals and 127 grams protein daily, which meets 100% of estimated kcal and protein needs.  Labs reviewed: Mg, K, and Phos WDL. CBGS: 165-216 (inpatient orders for glycemic control are 0-20 units insulin aspart every 4 hours and 24 units insulin glargine daily).   Diet Order:   Diet Order            Diet NPO time specified Except for: Ice Chips, Sips with Meds  Diet effective now              EDUCATION NEEDS:   Not appropriate for education at this time  Skin:  Skin Assessment: Skin Integrity Issues: Skin Integrity  Issues:: Stage II, Wound VAC Stage II: rt nare Wound Vac: abdomen  Last BM:  PTA  Height:   Ht Readings from Last 1 Encounters:  05/14/18 5' 3"  (1.6 m)    Weight:   Wt Readings from Last 1 Encounters:  05/22/18 116.3 kg    Ideal Body Weight:  52.3 kg  BMI:  Body mass index is 45.42 kg/m.  Estimated Nutritional Needs:   Kcal:  2100-2300kcal/day   Protein:  116-128g/day   Fluid:  >1.6L/day     Eltha Tingley A. Jimmye Norman, RD, LDN, CDE Pager: [phone] After hours Pager: [phone]

## 2018-05-24 NOTE — Progress Notes (Signed)
Peripherally Inserted Central Catheter/Midline Placement  The IV Nurse has discussed with the patient and/or persons authorized to consent for the patient, the purpose of this procedure and the potential benefits and risks involved with this procedure.  The benefits include less needle sticks, lab draws from the catheter, and the patient may be discharged home with the catheter. Risks include, but not limited to, infection, bleeding, blood clot (thrombus formation), and puncture of an artery; nerve damage and irregular heartbeat and possibility to perform a PICC exchange if needed/ordered by physician.  Alternatives to this procedure were also discussed.  Bard Power PICC patient education guide, fact sheet on infection prevention and patient information card has been provided to patient /or left at bedside.    PICC/Midline Placement Documentation  PICC Double Lumen 57/32/20 PICC Right Basilic 39 cm 0 cm (Active)  Indication for Insertion or Continuance of Line Administration of hyperosmolar/irritating solutions (i.e. TPN, Vancomycin, etc.) 05/24/2018  9:00 AM  Exposed Catheter (cm) 0 cm 05/24/2018  9:00 AM  Site Assessment Clean;Dry;Intact 05/24/2018  9:00 AM  Lumen #1 Status Flushed;Blood return noted;Saline locked 05/24/2018  9:00 AM  Lumen #2 Status Flushed;Heparin locked (Peds);Saline locked 05/24/2018  9:00 AM  Dressing Type Transparent 05/24/2018  9:00 AM  Dressing Status Dry;Clean;Intact;Antimicrobial disc in place 05/24/2018  9:00 AM  Dressing Change Due 05/31/18 05/24/2018  9:00 AM       Valentina Shaggy Ramos 05/24/2018, 9:02 AM

## 2018-05-24 NOTE — Progress Notes (Signed)
Pt RIJ CVC discontinued per MD order. Pt has a newly placed R DL PICC. No D10W started. Spoke with Dr. Sloan Leiter stated okay to not start D10W. Pt has D5W @ 50 cc/hr connected to new PICC. RN aware.

## 2018-05-25 LAB — GLUCOSE, CAPILLARY
Glucose-Capillary: 136 mg/dL — ABNORMAL HIGH (ref 70–99)
Glucose-Capillary: 147 mg/dL — ABNORMAL HIGH (ref 70–99)
Glucose-Capillary: 147 mg/dL — ABNORMAL HIGH (ref 70–99)
Glucose-Capillary: 152 mg/dL — ABNORMAL HIGH (ref 70–99)
Glucose-Capillary: 162 mg/dL — ABNORMAL HIGH (ref 70–99)
Glucose-Capillary: 180 mg/dL — ABNORMAL HIGH (ref 70–99)

## 2018-05-25 LAB — CBC
HCT: 26.6 % — ABNORMAL LOW (ref 36.0–46.0)
HEMOGLOBIN: 8.6 g/dL — AB (ref 12.0–15.0)
MCH: 27 pg (ref 26.0–34.0)
MCHC: 32.3 g/dL (ref 30.0–36.0)
MCV: 83.6 fL (ref 80.0–100.0)
Platelets: 399 10*3/uL (ref 150–400)
RBC: 3.18 MIL/uL — ABNORMAL LOW (ref 3.87–5.11)
RDW: 16.8 % — ABNORMAL HIGH (ref 11.5–15.5)
WBC: 17.1 10*3/uL — ABNORMAL HIGH (ref 4.0–10.5)
nRBC: 0.2 % (ref 0.0–0.2)

## 2018-05-25 MED ORDER — TRAVASOL 10 % IV SOLN
INTRAVENOUS | Status: AC
  Administered 2018-05-25: 18:00:00 via INTRAVENOUS
  Filled 2018-05-25: qty 1274.4

## 2018-05-25 MED ORDER — ALUM & MAG HYDROXIDE-SIMETH 200-200-20 MG/5ML PO SUSP
30.0000 mL | Freq: Four times a day (QID) | ORAL | Status: DC | PRN
  Filled 2018-05-25: qty 30

## 2018-05-25 NOTE — Progress Notes (Signed)
Inpatient Rehabilitation Admissions Coordinator  I continue to follow pt's progress. Not medically ready to begin discussions of dispo at this time. I will follow up next week.  Danne Baxter, RN, MSN Rehab Admissions Coordinator [phone] 05/25/2018 9:15 AM

## 2018-05-25 NOTE — Progress Notes (Signed)
PROGRESS NOTE        PATIENT DETAILS Name: Quanna Wittke Age: 58 y.o. Sex: female Date of Birth: 21-Apr-1960 Admit Date: 05/14/2018 Admitting Physician Rush Farmer, MD [id number], Drue Stager, MD  Brief Narrative: Patient is a 58 y.o. female with history of DM-2, pretension, GERD, morbid obesity who presented with a 3-day history of vomiting and abdominal pain-found to have sepsis secondary to small bowel obstruction and small perforation, along with DKA.  She was started on IV insulin, empiric antimicrobial therapy-General surgery was consulted-and patient was admitted to the ICU under the PCCM service.  Significant Events: 1/27 Admit > OR for ex lap with small bowel resection> ICU 1/29 OR for anastomosis and closure 2/1 Extubated 2/2 Hypoactive delirium, worsening respiratory status  Subjective: No vomiting-had a small BM last night.  Tolerating sips and chips.  Assessment/Plan: Acute hypoxic respiratory failure: Extubated on 2/1 (intubated for surgery) continue to wean to RA as able.  Septic shock secondary to peritonitis with bowel obstruction along with Clostridium bacteremia: Sepsis pathophysiology has resolved, blood cultures positive for Clostridium species.  Peritoneal fluid cultures positive for enterococcus and Clostridium as well.  Plans are to continue meropenem-stop date of 2/8.    Small bowel obstruction with perforation s/p resection and closure of the abdominal fascia: Postop course prolonged and complicated by ileus-CT abdomen on 2/6 continues to show dilated loops of small bowel.  Seems to have improved clinically-abdomen soft, appropriately tender-now having BMs.  Will await surgical input-but suspect he will be started on clear liquids.  Anemia: Likely anemia secondary to acute illness/critical illness-no evidence of blood loss.  Hemoglobin stable.  Follow periodically.    Hypernatremia: Resolved-continue OT in a.m.-do not think patient  requires D5W at this point    Type II DM with uncontrolled hyperglycemia: Had severe hyperglycemia with probable DKA on admission-CBG stable-continue Lantus 24 units daily and SSI.  Follow and optimize accordingly.    Hypertension: Blood pressure relatively well controlled-continue to monitor off antihypertensives-once oral intake has resumed we can resume her blood pressure medications.    GERD: Continue PPI  Dyslipidemia: Resume statin when oral intake is further stabilized  Debility/deconditioning: Secondary to acute illness/critical illness-CIR when more stable over the next few days.   DVT Prophylaxis: Prophylactic Lovenox   Code Status: Full code   Family Communication: Spouse at bedside  Disposition Plan: Remain inpatient-suspect CIR on discharge  Antimicrobial agents: Anti-infectives (From admission, onward)   Start     Dose/Rate Route Frequency Ordered Stop   05/19/18 1400  vancomycin (VANCOCIN) 1,250 mg in sodium chloride 0.9 % 250 mL IVPB  Status:  Discontinued     1,250 mg 166.7 mL/hr over 90 Minutes Intravenous Every 24 hours 05/18/18 1343 05/19/18 1244   05/19/18 1400  meropenem (MERREM) 2 g in sodium chloride 0.9 % 100 mL IVPB     2 g 200 mL/hr over 30 Minutes Intravenous Every 8 hours 05/19/18 1244 05/26/18 2359   05/18/18 1600  cefTRIAXone (ROCEPHIN) 2 g in sodium chloride 0.9 % 100 mL IVPB  Status:  Discontinued     2 g 200 mL/hr over 30 Minutes Intravenous Every 24 hours 05/18/18 1326 05/19/18 1244   05/18/18 1400  vancomycin (VANCOCIN) 2,000 mg in sodium chloride 0.9 % 500 mL IVPB     2,000 mg 250 mL/hr over 120 Minutes Intravenous  Once 05/18/18  1343 05/18/18 1640   05/15/18 2333  ceFEPIme (MAXIPIME) 2 g in sodium chloride 0.9 % 100 mL IVPB  Status:  Discontinued     2 g 200 mL/hr over 30 Minutes Intravenous Every 8 hours 05/15/18 2226 05/18/18 1326   05/15/18 1500  ceFEPIme (MAXIPIME) 1 g in sodium chloride 0.9 % 100 mL IVPB  Status:  Discontinued      1 g 200 mL/hr over 30 Minutes Intravenous Every 24 hours 05/14/18 1512 05/15/18 2226   05/15/18 0000  anidulafungin (ERAXIS) 100 mg in sodium chloride 0.9 % 100 mL IVPB  Status:  Discontinued     100 mg 78 mL/hr over 100 Minutes Intravenous Every 24 hours 05/14/18 1728 05/15/18 1159   05/14/18 1800  anidulafungin (ERAXIS) 200 mg in sodium chloride 0.9 % 200 mL IVPB     200 mg 78 mL/hr over 200 Minutes Intravenous  Once 05/14/18 1751 05/14/18 2138   05/14/18 1745  vancomycin (VANCOCIN) 2,000 mg in sodium chloride 0.9 % 500 mL IVPB  Status:  Discontinued     2,000 mg 250 mL/hr over 120 Minutes Intravenous  Once 05/14/18 1741 05/15/18 1159   05/14/18 1741  vancomycin variable dose per unstable renal function (pharmacist dosing)  Status:  Discontinued      Does not apply See admin instructions 05/14/18 1741 05/15/18 1159   05/14/18 1515  ceFEPIme (MAXIPIME) 2 g in sodium chloride 0.9 % 100 mL IVPB     2 g 200 mL/hr over 30 Minutes Intravenous  Once 05/14/18 1512 05/14/18 1649   05/14/18 1515  metroNIDAZOLE (FLAGYL) IVPB 500 mg  Status:  Discontinued     500 mg 100 mL/hr over 60 Minutes Intravenous Every 8 hours 05/14/18 1512 05/19/18 1244      Procedures: 2/6>>PICC line 1/29>> exploratory laparotomy with small bowel anastomosis and closure of abdominal fascia 1/27>>Right IJ (Anesthesia) 1/27>>EXPLORATORY LAPAROTOMY,SMALL BOWEL RESECTION,CLOSURE WITH ABTHERA VAC  CONSULTS:  pulmonary/intensive care and general surgery  Time spent: 25- minutes-Greater than 50% of this time was spent in counseling, explanation of diagnosis, planning of further management, and coordination of care.  MEDICATIONS: Scheduled Meds: . chlorhexidine  15 mL Mouth Rinse BID  . Chlorhexidine Gluconate Cloth  6 each Topical Daily  . enoxaparin (LOVENOX) injection  40 mg Subcutaneous Q24H  . fluticasone  2 spray Each Nare Daily  . insulin aspart  0-20 Units Subcutaneous Q4H  . insulin glargine  24 Units  Subcutaneous Daily  . mouth rinse  15 mL Mouth Rinse BID  . pantoprazole (PROTONIX) IV  40 mg Intravenous Q24H  . sodium chloride flush  10-40 mL Intracatheter Q12H  . venlafaxine XR  150 mg Oral Daily   Continuous Infusions: . dextrose 50 mL/hr at 05/24/18 0324  . meropenem (MERREM) IV 2 g (05/25/18 0517)  . TPN ADULT (ION) 90 mL/hr at 05/24/18 1713  . TPN ADULT (ION)     PRN Meds:.acetaminophen, alum & mag hydroxide-simeth, fentaNYL (SUBLIMAZE) injection, lip balm, sodium chloride flush, sodium chloride flush   PHYSICAL EXAM: Vital signs: Vitals:   05/24/18 1352 05/24/18 2045 05/25/18 0501 05/25/18 0601  BP: (!) 144/80 (!) 156/88 (!) 141/76   Pulse: 88 87 88   Resp: 18 16 20    Temp: 98.9 F (37.2 C) 98.7 F (37.1 C) 98.9 F (37.2 C)   TempSrc: Oral Oral Oral   SpO2: 99% 97% 95%   Weight:    120.4 kg  Height:       [employer]  05/21/18 0500 05/22/18 0454 05/25/18 0601  Weight: 116 kg 116.3 kg 120.4 kg   Body mass index is 47.02 kg/m.   General appearance:Awake, alert, not in any distress.  Eyes:no scleral icterus. HEENT: Atraumatic and Normocephalic Neck: supple, no JVD. Resp:Good air entry bilaterally,no added sounds anteriorly CVS: S1 S2 regular, no murmurs.  GI: Bowel sounds present, appropriately tender-slightly distended but with no peritoneal signs Extremities: B/L Lower Ext shows trace edema, both legs are warm to touch Neurology:  Non focal Musculoskeletal:No digital cyanosis Skin:No Rash, warm and dry Wounds:N/A  I have personally reviewed following labs and imaging studies  LABORATORY DATA: CBC: Recent Labs  Lab 05/20/18 0334  05/21/18 0644 05/22/18 1549 05/23/18 0332 05/24/18 0515 05/25/18 0352  WBC 13.4*  --  18.0* 17.0* 18.6* 21.0* 17.1*  NEUTROABS 8.5*  --  12.7*  --   --   --   --   HGB 8.3*   < > 8.3* 10.8* 8.7* 9.5* 8.6*  HCT 26.5*   < > 25.3* 33.0* 27.9* 30.0* 26.6*  MCV 81.5  --  81.6 80.7 85.6 84.0 83.6  PLT 423*  --  450*  259 468* 502* 399   < > = values in this interval not displayed.    Basic Metabolic Panel: Recent Labs  Lab 05/19/18 0521 05/20/18 0334  05/20/18 2301 05/21/18 0644 05/21/18 1453 05/21/18 2053 05/22/18 0500 05/23/18 0332 05/24/18 0515  NA 150* 152*   < >  --  155* 155* 152* 153* 149* 141  K 3.6 3.4*   < > 5.2* 3.7 3.7 4.5 3.6 4.0 3.8  CL 119* 120*  --   --  121* 120* 119* 118* 114* 105  CO2 19* 22  --   --  25 25 23 29 30 23   GLUCOSE 107* 214*  --   --  301* 149* 241* 238* 213* 217*  BUN 20 18  --   --  17 15 16 14 15 18   CREATININE 1.09* 0.80  --   --  0.79 0.74 0.78 0.77 0.72 0.77  CALCIUM 8.4* 8.3*  --   --  8.2* 8.5* 8.4* 8.3* 8.5* 8.7*  MG 2.3 2.2  --   --  2.1  --   --  2.0  --  1.9  PHOS 2.0* 1.3*  --  2.1* 2.1*  --   --  2.9  --  2.7   < > = values in this interval not displayed.    GFR: Estimated Creatinine Clearance: 96.3 mL/min (by C-G formula based on SCr of 0.77 mg/dL).  Liver Function Tests: Recent Labs  Lab 05/20/18 0334 05/21/18 0644 05/21/18 1453 05/24/18 0515  AST 12* 14* 20 35  ALT 21 17 18 22   ALKPHOS 51 50 58 63  BILITOT 0.2* 0.4 0.3 0.3  PROT 5.0* 5.0* 5.7* 6.0*  ALBUMIN 1.8* 1.8* 2.0* 2.1*   No results for input(s): LIPASE, AMYLASE in the last 168 hours. No results for input(s): AMMONIA in the last 168 hours.  Coagulation Profile: No results for input(s): INR, PROTIME in the last 168 hours.  Cardiac Enzymes: No results for input(s): CKTOTAL, CKMB, CKMBINDEX, TROPONINI in the last 168 hours.  BNP (last 3 results) No results for input(s): PROBNP in the last 8760 hours.  HbA1C: No results for input(s): HGBA1C in the last 72 hours.  CBG: Recent Labs  Lab 05/24/18 2047 05/25/18 0000 05/25/18 0458 05/25/18 0758 05/25/18 1211  GLUCAP 147* 147* 180* 152* 162*  Lipid Profile: No results for input(s): CHOL, HDL, LDLCALC, TRIG, CHOLHDL, LDLDIRECT in the last 72 hours.  Thyroid Function Tests: No results for input(s): TSH,  T4TOTAL, FREET4, T3FREE, THYROIDAB in the last 72 hours.  Anemia Panel: No results for input(s): VITAMINB12, FOLATE, FERRITIN, TIBC, IRON, RETICCTPCT in the last 72 hours.  Urine analysis:    Component Value Date/Time   COLORURINE YELLOW 05/14/2018 2014   APPEARANCEUR CLOUDY (A) 05/14/2018 2014   LABSPEC 1.023 05/14/2018 2014   PHURINE 5.0 05/14/2018 2014   GLUCOSEU >=500 (A) 05/14/2018 2014   HGBUR MODERATE (A) 05/14/2018 2014   BILIRUBINUR NEGATIVE 05/14/2018 2014   KETONESUR 20 (A) 05/14/2018 2014   PROTEINUR 100 (A) 05/14/2018 2014   NITRITE NEGATIVE 05/14/2018 2014   LEUKOCYTESUR TRACE (A) 05/14/2018 2014    Sepsis Labs: Lactic Acid, Venous    Component Value Date/Time   LATICACIDVEN 1.3 05/14/2018 2223    MICROBIOLOGY: Recent Results (from the past 240 hour(s))  Culture, blood (routine x 2)     Status: None   Collection Time: 05/16/18  1:50 AM  Result Value Ref Range Status   Specimen Description BLOOD RIGHT HAND  Final   Special Requests   Final    BOTTLES DRAWN AEROBIC ONLY Blood Culture adequate volume   Culture   Final    NO GROWTH 5 DAYS Performed at [facility], [place] [address] [place], [place] [address]    Report Status 05/21/2018 FINAL  Final  Culture, blood (routine x 2)     Status: None   Collection Time: 05/16/18  2:05 AM  Result Value Ref Range Status   Specimen Description BLOOD LEFT ANTECUBITAL  Final   Special Requests   Final    BOTTLES DRAWN AEROBIC ONLY Blood Culture results may not be optimal due to an inadequate volume of blood received in culture bottles   Culture   Final    NO GROWTH 5 DAYS Performed at [facility], [place] [address], [place], [place] [address]    Report Status 05/21/2018 FINAL  Final  Culture, blood (routine x 2)     Status: None (Preliminary result)   Collection Time: 05/24/18  7:20 PM  Result Value Ref Range Status   Specimen Description BLOOD LEFT ANTECUBITAL  Final   Special Requests   Final     BOTTLES DRAWN AEROBIC AND ANAEROBIC Blood Culture adequate volume   Culture   Final    NO GROWTH < 12 HOURS Performed at [facility], [place] [address] [place], [place] [address]    Report Status PENDING  Incomplete    RADIOLOGY STUDIES/RESULTS: Ct Head Wo Contrast  Result Date: 05/18/2018 CLINICAL DATA:  Altered level of consciousness. Patient following commands intermittently. EXAM: CT HEAD WITHOUT CONTRAST TECHNIQUE: Contiguous axial images were obtained from the base of the skull through the vertex without intravenous contrast. COMPARISON:  None. FINDINGS: Brain: No evidence of acute infarction, hemorrhage, hydrocephalus, extra-axial collection or mass lesion/mass effect. Vascular: No hyperdense vessel or unexpected calcification. Skull: Normal. Negative for fracture or focal lesion. Sinuses/Orbits: Globes and orbits are unremarkable. There is mild mucosal thickening lining the ethmoid air cells and left sphenoid sinus with minimal right sphenoid sinus and maxillary sinus mucosal thickening. Other: None. IMPRESSION: 1. No intracranial abnormalities. 2. Sinus mucosal thickening. Electronically Signed   By: Lajean Manes M.D.   On: 05/18/2018 14:29   Ct Abdomen Pelvis W Contrast  Result Date: 05/24/2018 CLINICAL DATA:  Postop course prolonged  and complicated by ileus-on TNA-due to worsening leukocytosis. Patient underwent exploratory laparotomy on 05/16/2018 small bowel resection. Concern for ileus versus small bowel obstruction or abscess. Leukocytosis. Question of anastomotic leak. EXAM: CT ABDOMEN AND PELVIS WITH CONTRAST TECHNIQUE: Multidetector CT imaging of the abdomen and pelvis was performed using the standard protocol following bolus administration of intravenous contrast. CONTRAST:  129mL OMNIPAQUE IOHEXOL 300 MG/ML  SOLN COMPARISON:  CT of the abdomen and pelvis on 05/14/2018 FINDINGS: Lower chest: There is new consolidation at the RIGHT LOWER lobe. Subsegmental atelectasis is  identified at the LEFT lung base. Hepatobiliary: Small cysts within the liver measures 1.1 centimeters. The gallbladder is present and contains faintly radiopaque calculus or calculi. Pancreas: Unremarkable. No pancreatic ductal dilatation or surrounding inflammatory changes. Spleen: Normal in size without focal abnormality. Adrenals/Urinary Tract: Adrenal glands are unremarkable. Kidneys are normal, without renal calculi, focal lesion, or hydronephrosis. Bladder is unremarkable. Stomach/Bowel: The stomach is normal in appearance. There has been interval resection of abnormal small bowel loop in the RIGHT LOWER QUADRANT. The anastomosis in the RIGHT LOWER QUADRANT appears unremarkable. There are persistent dilated loops of small bowel particularly in the central abdomen showing multiple caliber changes, suggestive of adhesions. Numerous air-fluid levels are identified in the central small bowel loops. Although there is fluid in the mesentery and RIGHT LOWER QUADRANT, there is no evidence for anastomotic leak of contrast. No free intraperitoneal air. There are scattered colonic diverticula but no evidence for acute diverticulitis. There is central mesenteric edema. Colon is normal in caliber. Vascular/Lymphatic: No significant vascular findings are present. No enlarged abdominal or pelvic lymph nodes. Reproductive: Uterus is enlarged and contains numerous low-attenuation lesions, suggestive of degenerating fibroids. No adnexal mass. Other: No free pelvic fluid. Mesenteric ascites. Edematous changes identified within the anterior abdominal wall. Postoperative changes in the anterior abdomen. Musculoskeletal: No acute or significant osseous findings. IMPRESSION: 1. Interval resection of abnormal small bowel loop in the RIGHT LOWER QUADRANT. 2. Persistent dilated loops of small bowel in the central abdomen, suggestive of adhesions, and consistent with small bowel obstruction. 3. No evidence for anastomotic leak. 4.  Mesenteric edema. 5. New consolidation at the RIGHT lung base. 6. Cholelithiasis. 7. Enlarged uterus containing numerous low-attenuation lesions, suggestive of degenerating fibroids. Electronically Signed   By: Nolon Nations M.D.   On: 05/24/2018 16:06   Dg Chest Port 1 View  Result Date: 05/17/2018 CLINICAL DATA:  Endotracheally intubated. EXAM: PORTABLE CHEST 1 VIEW COMPARISON:  05/15/2018 FINDINGS: Endotracheal tube terminates 2.5 cm above the carina. Enteric tube courses into the abdomen with tip not imaged and side hole below the diaphragm. Right jugular catheter terminates near the superior cavoatrial junction. The cardiomediastinal silhouette is unchanged with normal heart size. Mild bibasilar opacities have slightly improved. No sizable pleural effusion or pneumothorax is identified. IMPRESSION: Decreased, mild bibasilar atelectasis. Electronically Signed   By: Logan Bores M.D.   On: 05/17/2018 07:14   Dg Chest Port 1 View  Result Date: 05/15/2018 CLINICAL DATA:  Ventilator support EXAM: PORTABLE CHEST 1 VIEW COMPARISON:  05/14/2018 FINDINGS: Endotracheal tube tip is 2 cm above the carina. Nasogastric tube enters the stomach. Right internal jugular central line tip is in the proximal right atrium. No pneumothorax. Mild bilateral lower lobe atelectasis. IMPRESSION: Lines and tubes well positioned. Mild basilar atelectasis. Electronically Signed   By: Nelson Chimes M.D.   On: 05/15/2018 07:41   Dg Chest Portable 1 View  Result Date: 05/14/2018 CLINICAL DATA:  Abdominal pain for several days  EXAM: PORTABLE CHEST 1 VIEW COMPARISON:  CT from earlier in the same day. FINDINGS: Elevation of the right hemidiaphragm is again seen. Cardiac shadow is within normal limits. Mild right basilar atelectatic changes are noted. No sizable effusion is seen. No bony abnormality is noted. IMPRESSION: Mild right basilar atelectasis. Electronically Signed   By: Inez Catalina M.D.   On: 05/14/2018 15:26   Dg Abd  Portable 1v  Result Date: 05/24/2018 CLINICAL DATA:  Initial evaluation for acute vomiting, small bowel obstruction. EXAM: PORTABLE ABDOMEN - 1 VIEW COMPARISON:  Prior CT from 05/14/2018. FINDINGS: Multiple dilated gas-filled loops of small bowel seen within the left and mid abdomen, compatible with ongoing small bowel obstruction. Loops dilated up to approximately 4 cm in diameter. Paucity of gas distally. No soft tissue mass or abnormal calcification. No appreciable free air on these limited supine views of the abdomen. IMPRESSION: Multiple persistent gas-filled dilated loops of small bowel within the left and mid abdomen, compatible with ongoing small bowel obstruction. Electronically Signed   By: Jeannine Boga M.D.   On: 05/24/2018 06:00   Ct Renal Stone Study  Result Date: 05/14/2018 CLINICAL DATA:  58 y/o F; nausea, vomiting, and abdominal pain for 3 days. EXAM: CT ABDOMEN AND PELVIS WITHOUT CONTRAST TECHNIQUE: Multidetector CT imaging of the abdomen and pelvis was performed following the standard protocol without IV contrast. COMPARISON:  None. FINDINGS: Lower chest: Platelike atelectasis in the lung bases. Hepatobiliary: No focal liver abnormality is seen. No gallstones, gallbladder wall thickening, or biliary dilatation. Pancreas: Unremarkable. No pancreatic ductal dilatation or surrounding inflammatory changes. Spleen: Normal in size without focal abnormality. Adrenals/Urinary Tract: Adrenal glands are unremarkable. Kidneys are normal, without renal calculi, focal lesion, or hydronephrosis. Bladder is unremarkable. Stomach/Bowel: Severe diffuse small-bowel obstruction with transition at the level of the distal ileum in the right lower quadrant (series 3, image 65). Mild sigmoid diverticulosis without findings of acute diverticulitis. Otherwise normal appearance of the colon. Vascular/Lymphatic: Aortic atherosclerosis. No enlarged abdominal or pelvic lymph nodes. Reproductive: Negative. Other:  There are a few tiny foci of pneumoperitoneum at the liver hilum, anterior abdominal wall, and below the diaphragm (series 3, image 12, 24, 27). Small volume of ascites. Musculoskeletal: No fracture is seen. Lumbar spondylosis with prominent facet arthropathy. IMPRESSION: 1. Severe diffuse small bowel obstruction with transition level of distal ileum in right lower quadrant. 2. Few punctate foci of pneumoperitoneum indicating small/micro perforation. Small volume of ascites. These results were called by telephone at the time of interpretation on 05/14/2018 at 3:27 pm to Dr. Andy Gauss, who verbally acknowledged these results. Electronically Signed   By: Kristine Garbe M.D.   On: 05/14/2018 15:34   [place] Ekg Site Rite  Result Date: 05/23/2018 If Site Rite image not attached, placement could not be confirmed due to current cardiac rhythm.    LOS: 11 days   Oren Binet, MD  [facility]  If 7PM-7AM, please contact night-coverage  Please page via [email or web address] [email or web address]-click on MD name and type text message  05/25/2018, 1:01 PM

## 2018-05-25 NOTE — Progress Notes (Signed)
PT Cancellation Note  Patient Details Name: Wendy [name] MRN: [id number] DOB: 28-Sep-1960   Cancelled Treatment:    Reason Eval/Treat Not Completed: (P) Patient declined, no reason specified(Pt reports she has been moving to bathroom and back to bed, she refused further intevention despite education and encouragement.  Pt reports, " I don't feel good today."  )   Deeanna Beightol J Jhett Fretwell 05/25/2018, 4:31 PM

## 2018-05-25 NOTE — Progress Notes (Signed)
[place] NOTE   Pharmacy Consult for TPN Indication: ileus  Patient Measurements: Height: 5\' 3"  (160 cm) Weight: 265 lb 6.9 oz (120.4 kg) IBW/kg (Calculated) : 52.4 TPN AdjBW (KG): 68.8 Body mass index is 47.02 kg/m. Usual Weight: 117.9 kg  Assessment:  58 y/o female presenting to the ED with 3 days of vomiting and abdominal pain found to be in DKA. CT showed SBO. Noted SBO, pneumoperitoneum, intussusception with necrosis and performation and was taken to the OR emergently for ex lap, SBR left in discontinuity, and abthera VAC on 1/27. Taken back to the OR on 1/29 for ex lap with SB anastomosis and fascia closure. High risk for refeeding.  Husband reports patient eating well prior to experiencing GI distress.  GI: SBO, pneumoperitoneum, intussusception with necrosis and perforation. Patient reports abdominal pain with ice chips. Albumin 2.1, prealbumin improving 10.1>14.4, NG was displaced so it was taken out. CT still shows persistent dilated bowel. Back to NPO. May need to place new NG tube. TPN on 2/6 was stopped as their was an issue with the lines. TPN was not restarted until the new bag at 1800. PPI IV Endo: hx DM, CBGs slightly better but still uncontrolled (160-210s) Insulin requirements in the past 24 hours: 18 units SSI, 40 units in TPN, and 18 units of Lantus on 2/6 Lytes: Lytes wnl today. Na down further to 141, Cl down to 114, K 3.8 (goal >= 4), Mag 1.9 (goal >=2), Phos 2.7.  Renal: AKI resolved, SCr stable, UOP low but not charted Pulm: extubated Cards: MAP 80-90s, tachy Hepatobil: LFT and T bili WNL, TG 147 Neuro: awake but drowsy ID: On meropenem for IAI, Tm 99.1, WBC 17.1. Planned stop date of 2/8.  TPN Access: double lumen CVC placed 05/14/18 TPN start date: 05/19/18  Nutritional Goals (per RD recommendation on 05/19/18): KCal: 2100-2300 Protein: 116-128 g Fluid: >1.6L  Goal TPN rate is 90 ml/hr   Current Nutrition:   TPN NPO  Plan:  Continue TPN at 67mL/hr This TPN provides 127 g of protein, 302 g of dextrose, and 58 g of lipids which provides 2,120 kCals per day, meeting 100% of patient needs Electrolytes in TPN: Increase Na to 64mEq/L, continue increased K and Phos, Cl:Ac 1:2 Add MVI, trace elements to TPN Increase to 55 units of regular insulin in TPN Continue resistant SSI Q4h and Lantus 24 units per MD Monitor TPN labs, BMet tomorrow   Elenor Quinones, PharmD, BCPS, [facility] Clinical Pharmacist Phone number [phone] 05/25/2018 7:58 AM

## 2018-05-25 NOTE — Progress Notes (Signed)
9 Days Post-Op  Subjective: CC: Indigestion  Feels like she had some indigestion overnight. No abdominal pain or nausea since yesterday afternoon. Had a small, soft BM last night.   Objective: Vital signs in last 24 hours: Temp:  [98.7 F (37.1 C)-98.9 F (37.2 C)] 98.9 F (37.2 C) (02/07 0501) Pulse Rate:  [87-88] 88 (02/07 0501) Resp:  [16-20] 20 (02/07 0501) BP: (141-156)/(76-88) 141/76 (02/07 0501) SpO2:  [95 %-99 %] 95 % (02/07 0501) Weight:  [120.4 kg] 120.4 kg (02/07 0601) Last BM Date: 05/24/18  Intake/Output from previous day: 02/06 0701 - 02/07 0700 In: 1275.9 [I.V.:1075.9; IV Piggyback:200] Out: -  Intake/Output this shift: No intake/output data recorded.  PE: Gen: Awake and alert. NAD Pulm:rate and effort normal Abd: Soft,ND, fewBS appreciated, vac in place of midline, no tenderness.  Skin: no rashes noted, warm and dry  Lab Results:  Recent Labs    05/24/18 0515 05/25/18 0352  WBC 21.0* 17.1*  HGB 9.5* 8.6*  HCT 30.0* 26.6*  PLT 502* 399   BMET Recent Labs    05/23/18 0332 05/24/18 0515  NA 149* 141  K 4.0 3.8  CL 114* 105  CO2 30 23  GLUCOSE 213* 217*  BUN 15 18  CREATININE 0.72 0.77  CALCIUM 8.5* 8.7*   PT/INR No results for input(s): LABPROT, INR in the last 72 hours. CMP     Component Value Date/Time   NA 141 05/24/2018 0515   NA 144 12/25/2014 1536   K 3.8 05/24/2018 0515   CL 105 05/24/2018 0515   CO2 23 05/24/2018 0515   GLUCOSE 217 (H) 05/24/2018 0515   BUN 18 05/24/2018 0515   BUN 9 12/25/2014 1536   CREATININE 0.77 05/24/2018 0515   CREATININE 0.69 05/25/2017 1150   CALCIUM 8.7 (L) 05/24/2018 0515   PROT 6.0 (L) 05/24/2018 0515   PROT 6.7 12/25/2014 1536   ALBUMIN 2.1 (L) 05/24/2018 0515   ALBUMIN 4.4 12/25/2014 1536   AST 35 05/24/2018 0515   ALT 22 05/24/2018 0515   ALKPHOS 63 05/24/2018 0515   BILITOT 0.3 05/24/2018 0515   BILITOT 0.3 12/25/2014 1536   GFRNONAA >60 05/24/2018 0515   GFRNONAA 78  08/25/2016 1100   GFRAA >60 05/24/2018 0515   GFRAA >89 08/25/2016 1100   Lipase     Component Value Date/Time   LIPASE 37 05/14/2018 2014       Studies/Results: Ct Abdomen Pelvis W Contrast  Result Date: 05/24/2018 CLINICAL DATA:  Postop course prolonged and complicated by ileus-on TNA-due to worsening leukocytosis. Patient underwent exploratory laparotomy on 05/16/2018 small bowel resection. Concern for ileus versus small bowel obstruction or abscess. Leukocytosis. Question of anastomotic leak. EXAM: CT ABDOMEN AND PELVIS WITH CONTRAST TECHNIQUE: Multidetector CT imaging of the abdomen and pelvis was performed using the standard protocol following bolus administration of intravenous contrast. CONTRAST:  157mL OMNIPAQUE IOHEXOL 300 MG/ML  SOLN COMPARISON:  CT of the abdomen and pelvis on 05/14/2018 FINDINGS: Lower chest: There is new consolidation at the RIGHT LOWER lobe. Subsegmental atelectasis is identified at the LEFT lung base. Hepatobiliary: Small cysts within the liver measures 1.1 centimeters. The gallbladder is present and contains faintly radiopaque calculus or calculi. Pancreas: Unremarkable. No pancreatic ductal dilatation or surrounding inflammatory changes. Spleen: Normal in size without focal abnormality. Adrenals/Urinary Tract: Adrenal glands are unremarkable. Kidneys are normal, without renal calculi, focal lesion, or hydronephrosis. Bladder is unremarkable. Stomach/Bowel: The stomach is normal in appearance. There has been interval resection of  abnormal small bowel loop in the RIGHT LOWER QUADRANT. The anastomosis in the RIGHT LOWER QUADRANT appears unremarkable. There are persistent dilated loops of small bowel particularly in the central abdomen showing multiple caliber changes, suggestive of adhesions. Numerous air-fluid levels are identified in the central small bowel loops. Although there is fluid in the mesentery and RIGHT LOWER QUADRANT, there is no evidence for anastomotic  leak of contrast. No free intraperitoneal air. There are scattered colonic diverticula but no evidence for acute diverticulitis. There is central mesenteric edema. Colon is normal in caliber. Vascular/Lymphatic: No significant vascular findings are present. No enlarged abdominal or pelvic lymph nodes. Reproductive: Uterus is enlarged and contains numerous low-attenuation lesions, suggestive of degenerating fibroids. No adnexal mass. Other: No free pelvic fluid. Mesenteric ascites. Edematous changes identified within the anterior abdominal wall. Postoperative changes in the anterior abdomen. Musculoskeletal: No acute or significant osseous findings. IMPRESSION: 1. Interval resection of abnormal small bowel loop in the RIGHT LOWER QUADRANT. 2. Persistent dilated loops of small bowel in the central abdomen, suggestive of adhesions, and consistent with small bowel obstruction. 3. No evidence for anastomotic leak. 4. Mesenteric edema. 5. New consolidation at the RIGHT lung base. 6. Cholelithiasis. 7. Enlarged uterus containing numerous low-attenuation lesions, suggestive of degenerating fibroids. Electronically Signed   By: Nolon Nations M.D.   On: 05/24/2018 16:06   Dg Abd Portable 1v  Result Date: 05/24/2018 CLINICAL DATA:  Initial evaluation for acute vomiting, small bowel obstruction. EXAM: PORTABLE ABDOMEN - 1 VIEW COMPARISON:  Prior CT from 05/14/2018. FINDINGS: Multiple dilated gas-filled loops of small bowel seen within the left and mid abdomen, compatible with ongoing small bowel obstruction. Loops dilated up to approximately 4 cm in diameter. Paucity of gas distally. No soft tissue mass or abnormal calcification. No appreciable free air on these limited supine views of the abdomen. IMPRESSION: Multiple persistent gas-filled dilated loops of small bowel within the left and mid abdomen, compatible with ongoing small bowel obstruction. Electronically Signed   By: Jeannine Boga M.D.   On: 05/24/2018  06:00   [place] Ekg Site Rite  Result Date: 05/23/2018 If Site Rite image not attached, placement could not be confirmed due to current cardiac rhythm.   Anti-infectives: Anti-infectives (From admission, onward)   Start     Dose/Rate Route Frequency Ordered Stop   05/19/18 1400  vancomycin (VANCOCIN) 1,250 mg in sodium chloride 0.9 % 250 mL IVPB  Status:  Discontinued     1,250 mg 166.7 mL/hr over 90 Minutes Intravenous Every 24 hours 05/18/18 1343 05/19/18 1244   05/19/18 1400  meropenem (MERREM) 2 g in sodium chloride 0.9 % 100 mL IVPB     2 g 200 mL/hr over 30 Minutes Intravenous Every 8 hours 05/19/18 1244 05/26/18 2359   05/18/18 1600  cefTRIAXone (ROCEPHIN) 2 g in sodium chloride 0.9 % 100 mL IVPB  Status:  Discontinued     2 g 200 mL/hr over 30 Minutes Intravenous Every 24 hours 05/18/18 1326 05/19/18 1244   05/18/18 1400  vancomycin (VANCOCIN) 2,000 mg in sodium chloride 0.9 % 500 mL IVPB     2,000 mg 250 mL/hr over 120 Minutes Intravenous  Once 05/18/18 1343 05/18/18 1640   05/15/18 2333  ceFEPIme (MAXIPIME) 2 g in sodium chloride 0.9 % 100 mL IVPB  Status:  Discontinued     2 g 200 mL/hr over 30 Minutes Intravenous Every 8 hours 05/15/18 2226 05/18/18 1326   05/15/18 1500  ceFEPIme (MAXIPIME) 1 g in sodium  chloride 0.9 % 100 mL IVPB  Status:  Discontinued     1 g 200 mL/hr over 30 Minutes Intravenous Every 24 hours 05/14/18 1512 05/15/18 2226   05/15/18 0000  anidulafungin (ERAXIS) 100 mg in sodium chloride 0.9 % 100 mL IVPB  Status:  Discontinued     100 mg 78 mL/hr over 100 Minutes Intravenous Every 24 hours 05/14/18 1728 05/15/18 1159   05/14/18 1800  anidulafungin (ERAXIS) 200 mg in sodium chloride 0.9 % 200 mL IVPB     200 mg 78 mL/hr over 200 Minutes Intravenous  Once 05/14/18 1751 05/14/18 2138   05/14/18 1745  vancomycin (VANCOCIN) 2,000 mg in sodium chloride 0.9 % 500 mL IVPB  Status:  Discontinued     2,000 mg 250 mL/hr over 120 Minutes Intravenous  Once 05/14/18  1741 05/15/18 1159   05/14/18 1741  vancomycin variable dose per unstable renal function (pharmacist dosing)  Status:  Discontinued      Does not apply See admin instructions 05/14/18 1741 05/15/18 1159   05/14/18 1515  ceFEPIme (MAXIPIME) 2 g in sodium chloride 0.9 % 100 mL IVPB     2 g 200 mL/hr over 30 Minutes Intravenous  Once 05/14/18 1512 05/14/18 1649   05/14/18 1515  metroNIDAZOLE (FLAGYL) IVPB 500 mg  Status:  Discontinued     500 mg 100 mL/hr over 60 Minutes Intravenous Every 8 hours 05/14/18 1512 05/19/18 1244       Assessment/Plan DKA- per medicine, improving  Sepsis- per primary  SBO,Pneumoperitoneum, intussusception with necrosis & perforation - S/P ex lap, small bowel resection, closure with abthera vac, Dr. Grandville Silos, 01/17 -S/PExploratorylaparotomy with small bowel anastomosis and closure of abdominal fascia, Dr. Brantley Stage, 01/29 - NGT came out 02/05  - CT scan 2/6 showing ileus vs SBO, anastomosis intact, no abscess - NG tube if she vomits or asks for NG tube - continue broad abx coverage - continue TPN - IS   FEN:NPO, TPN VTE: SCD's, startedlovenox 01/30 TM:[id number]/11, Vanc 01/27; 01/31>>,Cefepime &Flagyl 01/27>>afebrile, blood cultures 01/29NGTD, peritoneal fluid cultures showed Enterococcus and Clostridium, WBC 21on 02/06, down trending to 17 2/7 Foley:DC 02/05 Follow up:TBD   LOS: 11 days    Jillyn Ledger , [facility] 05/25/2018, 8:13 AM Pager: [phone]

## 2018-05-26 ENCOUNTER — Inpatient Hospital Stay (HOSPITAL_COMMUNITY): Payer: 59

## 2018-05-26 DIAGNOSIS — L8990 Pressure ulcer of unspecified site, unspecified stage: Secondary | ICD-10-CM

## 2018-05-26 LAB — BASIC METABOLIC PANEL
Anion gap: 11 (ref 5–15)
BUN: 15 mg/dL (ref 6–20)
CO2: 25 mmol/L (ref 22–32)
Calcium: 8.3 mg/dL — ABNORMAL LOW (ref 8.9–10.3)
Chloride: 105 mmol/L (ref 98–111)
Creatinine, Ser: 0.62 mg/dL (ref 0.44–1.00)
GFR calc Af Amer: >60 mL/min (ref >60)
GFR calc non Af Amer: >60 mL/min (ref >60)
Glucose, Bld: 125 mg/dL — ABNORMAL HIGH (ref 70–99)
Potassium: 3.5 mmol/L (ref 3.5–5.1)
Sodium: 141 mmol/L (ref 135–145)

## 2018-05-26 LAB — CBC
HCT: 26.4 % — ABNORMAL LOW (ref 36.0–46.0)
Hemoglobin: 8.3 g/dL — ABNORMAL LOW (ref 12.0–15.0)
MCH: 26.8 pg (ref 26.0–34.0)
MCHC: 31.4 g/dL (ref 30.0–36.0)
MCV: 85.2 fL (ref 80.0–100.0)
Platelets: 381 10*3/uL (ref 150–400)
RBC: 3.1 MIL/uL — ABNORMAL LOW (ref 3.87–5.11)
RDW: 17.2 % — ABNORMAL HIGH (ref 11.5–15.5)
WBC: 13.5 10*3/uL — ABNORMAL HIGH (ref 4.0–10.5)
nRBC: 0.6 % — ABNORMAL HIGH (ref 0.0–0.2)

## 2018-05-26 LAB — GLUCOSE, CAPILLARY
Glucose-Capillary: 107 mg/dL — ABNORMAL HIGH (ref 70–99)
Glucose-Capillary: 108 mg/dL — ABNORMAL HIGH (ref 70–99)
Glucose-Capillary: 118 mg/dL — ABNORMAL HIGH (ref 70–99)
Glucose-Capillary: 129 mg/dL — ABNORMAL HIGH (ref 70–99)
Glucose-Capillary: 89 mg/dL (ref 70–99)

## 2018-05-26 MED ORDER — ACETYLCYSTEINE 10 % IN SOLN
4.0000 mL | Freq: Three times a day (TID) | RESPIRATORY_TRACT | Status: DC
  Filled 2018-05-26 (×2): qty 4

## 2018-05-26 MED ORDER — POTASSIUM CHLORIDE 10 MEQ/100ML IV SOLN
INTRAVENOUS | Status: AC
  Filled 2018-05-26: qty 100

## 2018-05-26 MED ORDER — IPRATROPIUM-ALBUTEROL 0.5-2.5 (3) MG/3ML IN SOLN
3.0000 mL | RESPIRATORY_TRACT | Status: DC | PRN
  Administered 2018-05-26: 3 mL via RESPIRATORY_TRACT
  Filled 2018-05-26: qty 3

## 2018-05-26 MED ORDER — POTASSIUM CHLORIDE 10 MEQ/50ML IV SOLN
10.0000 meq | INTRAVENOUS | Status: AC
  Administered 2018-05-26 (×4): 10 meq via INTRAVENOUS
  Filled 2018-05-26 (×4): qty 50

## 2018-05-26 MED ORDER — IPRATROPIUM-ALBUTEROL 0.5-2.5 (3) MG/3ML IN SOLN
3.0000 mL | Freq: Four times a day (QID) | RESPIRATORY_TRACT | Status: DC
  Administered 2018-05-26 – 2018-05-28 (×6): 3 mL via RESPIRATORY_TRACT
  Filled 2018-05-26 (×7): qty 3

## 2018-05-26 MED ORDER — ACETYLCYSTEINE 20 % IN SOLN
4.0000 mL | Freq: Three times a day (TID) | RESPIRATORY_TRACT | Status: AC
  Administered 2018-05-27 – 2018-05-28 (×5): 4 mL via RESPIRATORY_TRACT
  Filled 2018-05-26 (×6): qty 4

## 2018-05-26 MED ORDER — TRAVASOL 10 % IV SOLN
INTRAVENOUS | Status: AC
  Administered 2018-05-26: 18:00:00 via INTRAVENOUS
  Filled 2018-05-26: qty 1274.4

## 2018-05-26 MED ORDER — SODIUM CHLORIDE 0.9 % IV SOLN
3.0000 g | Freq: Four times a day (QID) | INTRAVENOUS | Status: DC
  Administered 2018-05-26 – 2018-05-27 (×5): 3 g via INTRAVENOUS
  Filled 2018-05-26 (×6): qty 3

## 2018-05-26 NOTE — Progress Notes (Signed)
[place] NOTE   Pharmacy Consult for TPN Indication: ileus  Patient Measurements: Height: 5\' 3"  (160 cm) Weight: 258 lb 13.1 oz (117.4 kg) IBW/kg (Calculated) : 52.4 TPN AdjBW (KG): 68.8 Body mass index is 45.85 kg/m. Usual Weight: 117.9 kg  Assessment:  58 y/o female presenting to the ED with 3 days of vomiting and abdominal pain found to be in DKA. CT showed SBO. Noted SBO, pneumoperitoneum, intussusception with necrosis and performation and was taken to the OR emergently for ex lap, SBR left in discontinuity, and abthera VAC on 1/27. Taken back to the OR on 1/29 for ex lap with SB anastomosis and fascia closure. High risk for refeeding.  Husband reports patient eating well prior to experiencing GI distress.  GI: SBO, pneumoperitoneum, intussusception with necrosis and perforation. Patient reports abdominal pain with ice chips. Albumin 2.1, prealbumin improving 10.1>14.4, NG was displaced so it was taken out. LBM 2/7.  PPI IV for GERD - CT scan 2/6 showing ileus vs SBO, anastomosis intact, no abscess  Endo: hx DM, CBGs improved (89-162) Insulin requirements in the past 24 hours: 14 units SSI, 55 units in TPN, and 24 units of Lantus  Lytes: Lytes wnl today. K 3.5 (goal >= 4), Mag 1.9 (goal >=2) Renal: AKI resolved, SCr stable Pulm:  Cards: h/o HLD, HTN Hepatobil: LFT and T bili WNL, TG 147 Neuro: Effexor ID: On meropenem for IAI, Afebrile, WBC 13.5 declining. Planned stop date of 2/8.  TPN Access: double lumen CVC placed 05/14/18 TPN start date: 05/19/18  Nutritional Goals (per RD recommendation on 05/19/18): KCal: 2100-2300 Protein: 116-128 g Fluid: >1.6L  Goal TPN rate is 90 ml/hr   Current Nutrition:  TPN NPO  Plan:  Continue TPN at 37mL/hr - This TPN provides 127 g of protein, 302 g of dextrose, and 58 g of lipids which provides 2,120 kCals per day, meeting 100% of patient needs Electrolytes in OZD:[id number] K+ for  goal>4 Add MVI, trace elements to TPN Increase to 55 units of regular insulin in TPN Continue resistant SSI Q4h and Lantus 24 units per MD Monitor TPN labs, BMet tomorrow D5W at 85ml/hr K runs x 4 this AM   Yasseen Salls S. Alford Highland, PharmD, BCPS Clinical Staff Pharmacist 05/26/2018 7:31 AM

## 2018-05-26 NOTE — Progress Notes (Addendum)
Pharmacy Antibiotic Note  Wendy [name] is a 58 y.o. female admitted on 05/14/2018 with bowel perforation.  Pharmacy has been consulted for Unasyn dosing for possible aspiration pneumonia.  Plan: Unasyn 3 g IV q6h  Follow up cultures, LOT, de-escalation as able  Height: 5\' 3"  (160 cm) Weight: 258 lb 13.1 oz (117.4 kg) IBW/kg (Calculated) : 52.4  Temp (24hrs), Avg:98.6 F (37 C), Min:98.1 F (36.7 C), Max:99.2 F (37.3 C)  Recent Labs  Lab 05/21/18 2053 05/22/18 0500 05/22/18 1549 05/23/18 0332 05/24/18 0515 05/25/18 0352 05/26/18 0320  WBC  --   --  17.0* 18.6* 21.0* 17.1* 13.5*  CREATININE 0.78 0.77  --  0.72 0.77  --  0.62    Estimated Creatinine Clearance: 94.9 mL/min (by C-G formula based on SCr of 0.62 mg/dL).    Allergies  Allergen Reactions  . Penicillins Swelling    Did it involve swelling of the face/tongue/throat, SOB, or low BP? Yes Did it involve sudden or severe rash/hives, skin peeling, or any reaction on the inside of your mouth or nose? No Did you need to seek medical attention at a hospital or doctor's office? Yes When did it last happen? unknown If all above answers are "NO", may proceed with cephalosporin use.     Antimicrobials this admission: Eraxis 1/27 >> 1/28 Cefepime 1/27>> 1/31 Ceftriaxone 1/31 x 1 Metronidazole 1/27>> 2/1 Vanc 1/31 x 1 Meropenem 2/1 >> 2/8  Microbiology results: Peritoneal 1/27 >> E Faecium and clostridium sordelli (ok for merrem)  Bcx 1/29 >> GVR (grew in bottle nothing on plate) Ucx 1/27 >> negative MRSA Pcr >> negative  Thank you for allowing pharmacy to be a part of this patient's care.  Vertis Kelch, PharmD PGY1 Pharmacy Resident Phone [phone] 05/26/2018       12:16 PM

## 2018-05-26 NOTE — Progress Notes (Signed)
PROGRESS NOTE        PATIENT DETAILS Name: Wendy [name] Age: 58 y.o. Sex: female Date of Birth: December 22, 1960 Admit Date: 05/14/2018 Admitting Physician Rush Farmer, MD WIO:[id number], Drue Stager, MD  Brief Narrative: Patient is a 58 y.o. female with history of DM-2, pretension, GERD, morbid obesity who presented with a 3-day history of vomiting and abdominal pain-found to have sepsis secondary to small bowel obstruction and small perforation, along with DKA.  She was started on IV insulin, empiric antimicrobial therapy-General surgery was consulted-and patient was admitted to the ICU under the PCCM service.  Significant Events: 1/27 Admit > OR for ex lap with small bowel resection> ICU 1/29 OR for anastomosis and closure 2/1 Extubated 2/2 Hypoactive delirium, worsening respiratory status  Subjective: Had BM last evening.  Became short of breath last night-received bronchodilators with improvement.  Complains of abdominal distention.  Assessment/Plan: Acute hypoxic respiratory failure: Extubated on 2/1 (intubated for surgery) continue to wean to RA as able.  Septic shock secondary to peritonitis with bowel obstruction along with Clostridium bacteremia: Sepsis pathophysiology has resolved, blood cultures positive for Clostridium species.  Peritoneal fluid cultures positive for enterococcus and Clostridium as well.  Plans are to continue meropenem-stop date of 2/8.    Small bowel obstruction with perforation s/p resection and closure of the abdominal fascia: Postop course prolonged and complicated by ileus-CT abdomen on 2/6 continues to show dilated loops of small bowel.  Seems to have improved clinically-abdomen soft, appropriately tender-now having BMs.  Continues to have small bowel dilatation on imaging studies-suspect radiological improvement will lag behind clinical improvement.  Await further recommendations from general surgery team.  Right middle lobe lung  collapse/pneumonia: Became short of breath last night-requiring bronchodilator treatment.  Chest x-ray this morning shows right middle lobe collapse-we will start IV Unasyn, bronchodilators and Mucomyst nebulizers.  Encourage incentive spirometry/flutter valve.  Needs chest PT and mobilization.   Anemia: Likely anemia secondary to acute illness/critical illness-no evidence of blood loss.  Hemoglobin stable.  Follow periodically.    Hypernatremia: Resolved-continue OT in a.m.-do not think patient requires D5W at this point    Type II DM with uncontrolled hyperglycemia: Had severe hyperglycemia with probable DKA on admission-CBG stable-continue Lantus 24 units daily and SSI.  Follow and optimize accordingly.    Hypertension: Blood pressure relatively well controlled-continue to monitor off antihypertensives-once oral intake has resumed we can resume her blood pressure medications.    GERD: Continue PPI  Dyslipidemia: Resume statin when oral intake is further stabilized  Debility/deconditioning: Secondary to acute illness/critical illness-CIR when more stable over the next few days.   DVT Prophylaxis: Prophylactic Lovenox   Code Status: Full code   Family Communication: Spouse at bedside  Disposition Plan: Remain inpatient-suspect CIR on discharge  Antimicrobial agents: Anti-infectives (From admission, onward)   Start     Dose/Rate Route Frequency Ordered Stop   05/19/18 1400  vancomycin (VANCOCIN) 1,250 mg in sodium chloride 0.9 % 250 mL IVPB  Status:  Discontinued     1,250 mg 166.7 mL/hr over 90 Minutes Intravenous Every 24 hours 05/18/18 1343 05/19/18 1244   05/19/18 1400  meropenem (MERREM) 2 g in sodium chloride 0.9 % 100 mL IVPB     2 g 200 mL/hr over 30 Minutes Intravenous Every 8 hours 05/19/18 1244 05/26/18 2359   05/18/18 1600  cefTRIAXone (ROCEPHIN) 2 g  in sodium chloride 0.9 % 100 mL IVPB  Status:  Discontinued     2 g 200 mL/hr over 30 Minutes Intravenous Every 24  hours 05/18/18 1326 05/19/18 1244   05/18/18 1400  vancomycin (VANCOCIN) 2,000 mg in sodium chloride 0.9 % 500 mL IVPB     2,000 mg 250 mL/hr over 120 Minutes Intravenous  Once 05/18/18 1343 05/18/18 1640   05/15/18 2333  ceFEPIme (MAXIPIME) 2 g in sodium chloride 0.9 % 100 mL IVPB  Status:  Discontinued     2 g 200 mL/hr over 30 Minutes Intravenous Every 8 hours 05/15/18 2226 05/18/18 1326   05/15/18 1500  ceFEPIme (MAXIPIME) 1 g in sodium chloride 0.9 % 100 mL IVPB  Status:  Discontinued     1 g 200 mL/hr over 30 Minutes Intravenous Every 24 hours 05/14/18 1512 05/15/18 2226   05/15/18 0000  anidulafungin (ERAXIS) 100 mg in sodium chloride 0.9 % 100 mL IVPB  Status:  Discontinued     100 mg 78 mL/hr over 100 Minutes Intravenous Every 24 hours 05/14/18 1728 05/15/18 1159   05/14/18 1800  anidulafungin (ERAXIS) 200 mg in sodium chloride 0.9 % 200 mL IVPB     200 mg 78 mL/hr over 200 Minutes Intravenous  Once 05/14/18 1751 05/14/18 2138   05/14/18 1745  vancomycin (VANCOCIN) 2,000 mg in sodium chloride 0.9 % 500 mL IVPB  Status:  Discontinued     2,000 mg 250 mL/hr over 120 Minutes Intravenous  Once 05/14/18 1741 05/15/18 1159   05/14/18 1741  vancomycin variable dose per unstable renal function (pharmacist dosing)  Status:  Discontinued      Does not apply See admin instructions 05/14/18 1741 05/15/18 1159   05/14/18 1515  ceFEPIme (MAXIPIME) 2 g in sodium chloride 0.9 % 100 mL IVPB     2 g 200 mL/hr over 30 Minutes Intravenous  Once 05/14/18 1512 05/14/18 1649   05/14/18 1515  metroNIDAZOLE (FLAGYL) IVPB 500 mg  Status:  Discontinued     500 mg 100 mL/hr over 60 Minutes Intravenous Every 8 hours 05/14/18 1512 05/19/18 1244      Procedures: 2/6>>PICC line 1/29>> exploratory laparotomy with small bowel anastomosis and closure of abdominal fascia 1/27>>Right IJ (Anesthesia) 1/27>>EXPLORATORY LAPAROTOMY,SMALL BOWEL RESECTION,CLOSURE WITH ABTHERA VAC  CONSULTS:  pulmonary/intensive  care and general surgery  Time spent: 25- minutes-Greater than 50% of this time was spent in counseling, explanation of diagnosis, planning of further management, and coordination of care.  MEDICATIONS: Scheduled Meds: . acetylcysteine  4 mL Nebulization TID  . chlorhexidine  15 mL Mouth Rinse BID  . Chlorhexidine Gluconate Cloth  6 each Topical Daily  . enoxaparin (LOVENOX) injection  40 mg Subcutaneous Q24H  . fluticasone  2 spray Each Nare Daily  . insulin aspart  0-20 Units Subcutaneous Q4H  . insulin glargine  24 Units Subcutaneous Daily  . ipratropium-albuterol  3 mL Nebulization Q6H  . mouth rinse  15 mL Mouth Rinse BID  . pantoprazole (PROTONIX) IV  40 mg Intravenous Q24H  . sodium chloride flush  10-40 mL Intracatheter Q12H  . venlafaxine XR  150 mg Oral Daily   Continuous Infusions: . dextrose 10 mL/hr at 05/25/18 1455  . meropenem (MERREM) IV 2 g (05/26/18 0544)  . potassium chloride 10 mEq (05/26/18 1027)  . TPN ADULT (ION) 90 mL/hr at 05/25/18 1734  . TPN ADULT (ION)     PRN Meds:.acetaminophen, alum & mag hydroxide-simeth, fentaNYL (SUBLIMAZE) injection, lip balm, sodium chloride  flush, sodium chloride flush   PHYSICAL EXAM: Vital signs: Vitals:   05/25/18 2025 05/26/18 0500 05/26/18 0541 05/26/18 1206  BP: (!) 159/84  135/73 (!) 144/78  Pulse: 91  90 (!) 103  Resp: 18  16 20   Temp: 98.1 F (36.7 C)  98.3 F (36.8 C) 99.2 F (37.3 C)  TempSrc: Oral  Oral Oral  SpO2: 96%  100% 96%  Weight:  117.4 kg    Height:       Filed Weights   05/22/18 0454 05/25/18 0601 05/26/18 0500  Weight: 116.3 kg 120.4 kg 117.4 kg   Body mass index is 45.85 kg/m.   General appearance:Awake, alert, not in any distress.  Eyes:no scleral icterus. HEENT: Atraumatic and Normocephalic Neck: supple, no JVD. Resp:Good air entry bilaterally, no rales or rhonchi heard. CVS: S1 S2 regular, no murmurs.  GI: Bowel sounds present, appropriately tender-minimally distended.     Extremities: B/L Lower Ext shows no edema, both legs are warm to touch Neurology:  Non focal Psychiatric: Normal judgment and insight. Normal mood. Musculoskeletal:No digital cyanosis Skin:No Rash, warm and dry Wounds:N/A  I have personally reviewed following labs and imaging studies  LABORATORY DATA: CBC: Recent Labs  Lab 05/20/18 0334  05/21/18 0644 05/22/18 1549 05/23/18 0332 05/24/18 0515 05/25/18 0352 05/26/18 0320  WBC 13.4*  --  18.0* 17.0* 18.6* 21.0* 17.1* 13.5*  NEUTROABS 8.5*  --  12.7*  --   --   --   --   --   HGB 8.3*   < > 8.3* 10.8* 8.7* 9.5* 8.6* 8.3*  HCT 26.5*   < > 25.3* 33.0* 27.9* 30.0* 26.6* 26.4*  MCV 81.5  --  81.6 80.7 85.6 84.0 83.6 85.2  PLT 423*  --  450* 259 468* 502* 399 381   < > = values in this interval not displayed.    Basic Metabolic Panel: Recent Labs  Lab 05/20/18 0334  05/20/18 2301 05/21/18 0644  05/21/18 2053 05/22/18 0500 05/23/18 0332 05/24/18 0515 05/26/18 0320  NA 152*   < >  --  155*   < > 152* 153* 149* 141 141  K 3.4*   < > 5.2* 3.7   < > 4.5 3.6 4.0 3.8 3.5  CL 120*  --   --  121*   < > 119* 118* 114* 105 105  CO2 22  --   --  25   < > 23 29 30 23 25   GLUCOSE 214*  --   --  301*   < > 241* 238* 213* 217* 125*  BUN 18  --   --  17   < > 16 14 15 18 15   CREATININE 0.80  --   --  0.79   < > 0.78 0.77 0.72 0.77 0.62  CALCIUM 8.3*  --   --  8.2*   < > 8.4* 8.3* 8.5* 8.7* 8.3*  MG 2.2  --   --  2.1  --   --  2.0  --  1.9  --   PHOS 1.3*  --  2.1* 2.1*  --   --  2.9  --  2.7  --    < > = values in this interval not displayed.    GFR: Estimated Creatinine Clearance: 94.9 mL/min (by C-G formula based on SCr of 0.62 mg/dL).  Liver Function Tests: Recent Labs  Lab 05/20/18 0334 05/21/18 0644 05/21/18 1453 05/24/18 0515  AST 12* 14* 20 35  ALT 21 17  18 22  ALKPHOS 51 50 58 63  BILITOT 0.2* 0.4 0.3 0.3  PROT 5.0* 5.0* 5.7* 6.0*  ALBUMIN 1.8* 1.8* 2.0* 2.1*   No results for input(s): LIPASE, AMYLASE in the  last 168 hours. No results for input(s): AMMONIA in the last 168 hours.  Coagulation Profile: No results for input(s): INR, PROTIME in the last 168 hours.  Cardiac Enzymes: No results for input(s): CKTOTAL, CKMB, CKMBINDEX, TROPONINI in the last 168 hours.  BNP (last 3 results) No results for input(s): PROBNP in the last 8760 hours.  HbA1C: No results for input(s): HGBA1C in the last 72 hours.  CBG: Recent Labs  Lab 05/25/18 1211 05/25/18 1637 05/25/18 2019 05/26/18 0017 05/26/18 0457  GLUCAP 162* 147* 136* 89 118*    Lipid Profile: No results for input(s): CHOL, HDL, LDLCALC, TRIG, CHOLHDL, LDLDIRECT in the last 72 hours.  Thyroid Function Tests: No results for input(s): TSH, T4TOTAL, FREET4, T3FREE, THYROIDAB in the last 72 hours.  Anemia Panel: No results for input(s): VITAMINB12, FOLATE, FERRITIN, TIBC, IRON, RETICCTPCT in the last 72 hours.  Urine analysis:    Component Value Date/Time   COLORURINE YELLOW 05/14/2018 2014   APPEARANCEUR CLOUDY (A) 05/14/2018 2014   LABSPEC 1.023 05/14/2018 2014   PHURINE 5.0 05/14/2018 2014   GLUCOSEU >=500 (A) 05/14/2018 2014   HGBUR MODERATE (A) 05/14/2018 2014   BILIRUBINUR NEGATIVE 05/14/2018 2014   KETONESUR 20 (A) 05/14/2018 2014   PROTEINUR 100 (A) 05/14/2018 2014   NITRITE NEGATIVE 05/14/2018 2014   LEUKOCYTESUR TRACE (A) 05/14/2018 2014    Sepsis Labs: Lactic Acid, Venous    Component Value Date/Time   LATICACIDVEN 1.3 05/14/2018 2223    MICROBIOLOGY: Recent Results (from the past 240 hour(s))  Culture, blood (routine x 2)     Status: None (Preliminary result)   Collection Time: 05/24/18  7:20 PM  Result Value Ref Range Status   Specimen Description BLOOD LEFT ANTECUBITAL  Final   Special Requests   Final    BOTTLES DRAWN AEROBIC AND ANAEROBIC Blood Culture adequate volume   Culture   Final    NO GROWTH < 24 HOURS Performed at [facility], [place] [address], [place], [place] [address]    Report  Status PENDING  Incomplete    RADIOLOGY STUDIES/RESULTS: Ct Head Wo Contrast  Result Date: 05/18/2018 CLINICAL DATA:  Altered level of consciousness. Patient following commands intermittently. EXAM: CT HEAD WITHOUT CONTRAST TECHNIQUE: Contiguous axial images were obtained from the base of the skull through the vertex without intravenous contrast. COMPARISON:  None. FINDINGS: Brain: No evidence of acute infarction, hemorrhage, hydrocephalus, extra-axial collection or mass lesion/mass effect. Vascular: No hyperdense vessel or unexpected calcification. Skull: Normal. Negative for fracture or focal lesion. Sinuses/Orbits: Globes and orbits are unremarkable. There is mild mucosal thickening lining the ethmoid air cells and left sphenoid sinus with minimal right sphenoid sinus and maxillary sinus mucosal thickening. Other: None. IMPRESSION: 1. No intracranial abnormalities. 2. Sinus mucosal thickening. Electronically Signed   By: Lajean Manes M.D.   On: 05/18/2018 14:29   Ct Abdomen Pelvis W Contrast  Result Date: 05/24/2018 CLINICAL DATA:  Postop course prolonged and complicated by ileus-on TNA-due to worsening leukocytosis. Patient underwent exploratory laparotomy on 05/16/2018 small bowel resection. Concern for ileus versus small bowel obstruction or abscess. Leukocytosis. Question of anastomotic leak. EXAM: CT ABDOMEN AND PELVIS WITH CONTRAST TECHNIQUE: Multidetector CT imaging of the abdomen and pelvis was performed using the standard protocol following bolus administration of intravenous  contrast. CONTRAST:  18mL OMNIPAQUE IOHEXOL 300 MG/ML  SOLN COMPARISON:  CT of the abdomen and pelvis on 05/14/2018 FINDINGS: Lower chest: There is new consolidation at the RIGHT LOWER lobe. Subsegmental atelectasis is identified at the LEFT lung base. Hepatobiliary: Small cysts within the liver measures 1.1 centimeters. The gallbladder is present and contains faintly radiopaque calculus or calculi. Pancreas:  Unremarkable. No pancreatic ductal dilatation or surrounding inflammatory changes. Spleen: Normal in size without focal abnormality. Adrenals/Urinary Tract: Adrenal glands are unremarkable. Kidneys are normal, without renal calculi, focal lesion, or hydronephrosis. Bladder is unremarkable. Stomach/Bowel: The stomach is normal in appearance. There has been interval resection of abnormal small bowel loop in the RIGHT LOWER QUADRANT. The anastomosis in the RIGHT LOWER QUADRANT appears unremarkable. There are persistent dilated loops of small bowel particularly in the central abdomen showing multiple caliber changes, suggestive of adhesions. Numerous air-fluid levels are identified in the central small bowel loops. Although there is fluid in the mesentery and RIGHT LOWER QUADRANT, there is no evidence for anastomotic leak of contrast. No free intraperitoneal air. There are scattered colonic diverticula but no evidence for acute diverticulitis. There is central mesenteric edema. Colon is normal in caliber. Vascular/Lymphatic: No significant vascular findings are present. No enlarged abdominal or pelvic lymph nodes. Reproductive: Uterus is enlarged and contains numerous low-attenuation lesions, suggestive of degenerating fibroids. No adnexal mass. Other: No free pelvic fluid. Mesenteric ascites. Edematous changes identified within the anterior abdominal wall. Postoperative changes in the anterior abdomen. Musculoskeletal: No acute or significant osseous findings. IMPRESSION: 1. Interval resection of abnormal small bowel loop in the RIGHT LOWER QUADRANT. 2. Persistent dilated loops of small bowel in the central abdomen, suggestive of adhesions, and consistent with small bowel obstruction. 3. No evidence for anastomotic leak. 4. Mesenteric edema. 5. New consolidation at the RIGHT lung base. 6. Cholelithiasis. 7. Enlarged uterus containing numerous low-attenuation lesions, suggestive of degenerating fibroids. Electronically  Signed   By: Nolon Nations M.D.   On: 05/24/2018 16:06   Dg Chest Port 1 View  Result Date: 05/17/2018 CLINICAL DATA:  Endotracheally intubated. EXAM: PORTABLE CHEST 1 VIEW COMPARISON:  05/15/2018 FINDINGS: Endotracheal tube terminates 2.5 cm above the carina. Enteric tube courses into the abdomen with tip not imaged and side hole below the diaphragm. Right jugular catheter terminates near the superior cavoatrial junction. The cardiomediastinal silhouette is unchanged with normal heart size. Mild bibasilar opacities have slightly improved. No sizable pleural effusion or pneumothorax is identified. IMPRESSION: Decreased, mild bibasilar atelectasis. Electronically Signed   By: Logan Bores M.D.   On: 05/17/2018 07:14   Dg Chest Port 1 View  Result Date: 05/15/2018 CLINICAL DATA:  Ventilator support EXAM: PORTABLE CHEST 1 VIEW COMPARISON:  05/14/2018 FINDINGS: Endotracheal tube tip is 2 cm above the carina. Nasogastric tube enters the stomach. Right internal jugular central line tip is in the proximal right atrium. No pneumothorax. Mild bilateral lower lobe atelectasis. IMPRESSION: Lines and tubes well positioned. Mild basilar atelectasis. Electronically Signed   By: Nelson Chimes M.D.   On: 05/15/2018 07:41   Dg Chest Portable 1 View  Result Date: 05/14/2018 CLINICAL DATA:  Abdominal pain for several days EXAM: PORTABLE CHEST 1 VIEW COMPARISON:  CT from earlier in the same day. FINDINGS: Elevation of the right hemidiaphragm is again seen. Cardiac shadow is within normal limits. Mild right basilar atelectatic changes are noted. No sizable effusion is seen. No bony abnormality is noted. IMPRESSION: Mild right basilar atelectasis. Electronically Signed   By: Elta Guadeloupe  Lukens M.D.   On: 05/14/2018 15:26   Dg Chest Port 1v Same Day  Result Date: 05/26/2018 CLINICAL DATA:  58 year old female with shortness of breath. Recent abdominal surgery. EXAM: PORTABLE CHEST 1 VIEW COMPARISON:  Prior chest x-ray 05/17/2018  FINDINGS: The patient has been extubated and the nasogastric tube removed. Additionally, the right IJ central venous catheter has been removed. A right upper extremity PICC has been placed. The catheter tip overlies the distal SVC. Dense band of linear airspace opacity in the right lung obscuring the right cardiac margin favored to represent right middle lobar collapse/atelectasis. Additionally, there is linear atelectasis in the left lower lobe. No evidence of pulmonary edema or pneumothorax. No acute osseous abnormality. IMPRESSION: 1. Multifocal atelectasis including right middle lobe collapse. This may be secondary to mucous plugging. 2. Well-positioned right upper extremity approach PICC. Electronically Signed   By: Jacqulynn Cadet M.D.   On: 05/26/2018 10:19   Dg Abd Portable 1v  Result Date: 05/26/2018 CLINICAL DATA:  58 year old female with abdominal distension and shortness of breath EXAM: PORTABLE ABDOMEN - 1 VIEW COMPARISON:  CT abdomen/pelvis 05/24/2018 FINDINGS: Persistent diffuse gaseous distention of multiple loops of small bowel throughout the abdomen. No massive free air visualized on this supine radiograph. Lower lumbar degenerative disc disease again noted. IMPRESSION: Persistent gaseous distention of multiple loops of small bowel throughout the abdomen most consistent with ileus. Electronically Signed   By: Jacqulynn Cadet M.D.   On: 05/26/2018 10:18   Dg Abd Portable 1v  Result Date: 05/24/2018 CLINICAL DATA:  Initial evaluation for acute vomiting, small bowel obstruction. EXAM: PORTABLE ABDOMEN - 1 VIEW COMPARISON:  Prior CT from 05/14/2018. FINDINGS: Multiple dilated gas-filled loops of small bowel seen within the left and mid abdomen, compatible with ongoing small bowel obstruction. Loops dilated up to approximately 4 cm in diameter. Paucity of gas distally. No soft tissue mass or abnormal calcification. No appreciable free air on these limited supine views of the abdomen.  IMPRESSION: Multiple persistent gas-filled dilated loops of small bowel within the left and mid abdomen, compatible with ongoing small bowel obstruction. Electronically Signed   By: Jeannine Boga M.D.   On: 05/24/2018 06:00   Ct Renal Stone Study  Result Date: 05/14/2018 CLINICAL DATA:  58 y/o F; nausea, vomiting, and abdominal pain for 3 days. EXAM: CT ABDOMEN AND PELVIS WITHOUT CONTRAST TECHNIQUE: Multidetector CT imaging of the abdomen and pelvis was performed following the standard protocol without IV contrast. COMPARISON:  None. FINDINGS: Lower chest: Platelike atelectasis in the lung bases. Hepatobiliary: No focal liver abnormality is seen. No gallstones, gallbladder wall thickening, or biliary dilatation. Pancreas: Unremarkable. No pancreatic ductal dilatation or surrounding inflammatory changes. Spleen: Normal in size without focal abnormality. Adrenals/Urinary Tract: Adrenal glands are unremarkable. Kidneys are normal, without renal calculi, focal lesion, or hydronephrosis. Bladder is unremarkable. Stomach/Bowel: Severe diffuse small-bowel obstruction with transition at the level of the distal ileum in the right lower quadrant (series 3, image 65). Mild sigmoid diverticulosis without findings of acute diverticulitis. Otherwise normal appearance of the colon. Vascular/Lymphatic: Aortic atherosclerosis. No enlarged abdominal or pelvic lymph nodes. Reproductive: Negative. Other: There are a few tiny foci of pneumoperitoneum at the liver hilum, anterior abdominal wall, and below the diaphragm (series 3, image 12, 24, 27). Small volume of ascites. Musculoskeletal: No fracture is seen. Lumbar spondylosis with prominent facet arthropathy. IMPRESSION: 1. Severe diffuse small bowel obstruction with transition level of distal ileum in right lower quadrant. 2. Few punctate foci of pneumoperitoneum  indicating small/micro perforation. Small volume of ascites. These results were called by telephone at the time  of interpretation on 05/14/2018 at 3:27 pm to Dr. Andy Gauss, who verbally acknowledged these results. Electronically Signed   By: Kristine Garbe M.D.   On: 05/14/2018 15:34   [place] Ekg Site Rite  Result Date: 05/23/2018 If Site Rite image not attached, placement could not be confirmed due to current cardiac rhythm.    LOS: 12 days   Oren Binet, MD  [facility]  If 7PM-7AM, please contact night-coverage  Please page via [email or web address]-Password [email or web address]-click on MD name and type text message  05/26/2018, 12:11 PM

## 2018-05-26 NOTE — Progress Notes (Signed)
Patient refused all treatment at this time, she states she just got cleaned up and is in a comfortable place and would just like to rest at this time. No respiratory distress noted, HR 118 SpO2 95% on RA.

## 2018-05-26 NOTE — Progress Notes (Signed)
PRN breathing treatment given, lungs clear in upper lobes.

## 2018-05-26 NOTE — Progress Notes (Signed)
0700-1900 Nurse shift note: Patient alert and oriented x4 s/p abdominal surgery with wound vac in place. No output from vac today during shift. Patient received antibiotic and K replacements today. Patient OOB  to rest room today x2 with lots of gas and  bowel movements (2). Patient also had output unmeasured x1 and another occurrence with 450 ml. Out. Patient dangled on the side of the bed for 20 min only. She would not get out of bed to chair despite several request today. Patient used incentive spirometry and flutter valve. Patient also practice cough and deep breath. Non productive congested cough present.

## 2018-05-26 NOTE — Progress Notes (Signed)
Patient Sitting on the side of the bed using flutter valve and legs dangling freely. Patient assisted to bathroom,  had a bowel movement and passed lots of gas.

## 2018-05-26 NOTE — Progress Notes (Cosign Needed)
10 Days Post-Op  Subjective: CC: Abdominal Pain Patient with soreness of the abdomen and some bloating overnight. No N/V. Had another small BM this AM.   Objective: Vital signs in last 24 hours: Temp:  [98.1 F (36.7 C)-98.8 F (37.1 C)] 98.3 F (36.8 C) (02/08 0541) Pulse Rate:  [90-98] 90 (02/08 0541) Resp:  [16-19] 16 (02/08 0541) BP: (135-159)/(73-85) 135/73 (02/08 0541) SpO2:  [96 %-100 %] 100 % (02/08 0541) Weight:  [117.4 kg] 117.4 kg (02/08 0500) Last BM Date: 05/25/18  Intake/Output from previous day: 02/07 0701 - 02/08 0700 In: 1519.2 [I.V.:965.2; IV Piggyback:554] Out: -  Intake/Output this shift: No intake/output data recorded.  PE: Gen: Awake and alert. NAD Heart: RRR Pulm:rate and effort normal Abd: Soft,ND,fewBS appreciated, vac in place of midline,no tenderness.  Skin: no rashes noted, warm and dry  Lab Results:  Recent Labs    05/25/18 0352 05/26/18 0320  WBC 17.1* 13.5*  HGB 8.6* 8.3*  HCT 26.6* 26.4*  PLT 399 381   BMET Recent Labs    05/24/18 0515 05/26/18 0320  NA 141 141  K 3.8 3.5  CL 105 105  CO2 23 25  GLUCOSE 217* 125*  BUN 18 15  CREATININE 0.77 0.62  CALCIUM 8.7* 8.3*   PT/INR No results for input(s): LABPROT, INR in the last 72 hours. CMP     Component Value Date/Time   NA 141 05/26/2018 0320   NA 144 12/25/2014 1536   K 3.5 05/26/2018 0320   CL 105 05/26/2018 0320   CO2 25 05/26/2018 0320   GLUCOSE 125 (H) 05/26/2018 0320   BUN 15 05/26/2018 0320   BUN 9 12/25/2014 1536   CREATININE 0.62 05/26/2018 0320   CREATININE 0.69 05/25/2017 1150   CALCIUM 8.3 (L) 05/26/2018 0320   PROT 6.0 (L) 05/24/2018 0515   PROT 6.7 12/25/2014 1536   ALBUMIN 2.1 (L) 05/24/2018 0515   ALBUMIN 4.4 12/25/2014 1536   AST 35 05/24/2018 0515   ALT 22 05/24/2018 0515   ALKPHOS 63 05/24/2018 0515   BILITOT 0.3 05/24/2018 0515   BILITOT 0.3 12/25/2014 1536   GFRNONAA >60 05/26/2018 0320   GFRNONAA 78 08/25/2016 1100   GFRAA >60 05/26/2018 0320   GFRAA >89 08/25/2016 1100   Lipase     Component Value Date/Time   LIPASE 37 05/14/2018 2014       Studies/Results: Ct Abdomen Pelvis W Contrast  Result Date: 05/24/2018 CLINICAL DATA:  Postop course prolonged and complicated by ileus-on TNA-due to worsening leukocytosis. Patient underwent exploratory laparotomy on 05/16/2018 small bowel resection. Concern for ileus versus small bowel obstruction or abscess. Leukocytosis. Question of anastomotic leak. EXAM: CT ABDOMEN AND PELVIS WITH CONTRAST TECHNIQUE: Multidetector CT imaging of the abdomen and pelvis was performed using the standard protocol following bolus administration of intravenous contrast. CONTRAST:  14mL OMNIPAQUE IOHEXOL 300 MG/ML  SOLN COMPARISON:  CT of the abdomen and pelvis on 05/14/2018 FINDINGS: Lower chest: There is new consolidation at the RIGHT LOWER lobe. Subsegmental atelectasis is identified at the LEFT lung base. Hepatobiliary: Small cysts within the liver measures 1.1 centimeters. The gallbladder is present and contains faintly radiopaque calculus or calculi. Pancreas: Unremarkable. No pancreatic ductal dilatation or surrounding inflammatory changes. Spleen: Normal in size without focal abnormality. Adrenals/Urinary Tract: Adrenal glands are unremarkable. Kidneys are normal, without renal calculi, focal lesion, or hydronephrosis. Bladder is unremarkable. Stomach/Bowel: The stomach is normal in appearance. There has been interval resection of abnormal small bowel loop in  the RIGHT LOWER QUADRANT. The anastomosis in the RIGHT LOWER QUADRANT appears unremarkable. There are persistent dilated loops of small bowel particularly in the central abdomen showing multiple caliber changes, suggestive of adhesions. Numerous air-fluid levels are identified in the central small bowel loops. Although there is fluid in the mesentery and RIGHT LOWER QUADRANT, there is no evidence for anastomotic leak of contrast. No  free intraperitoneal air. There are scattered colonic diverticula but no evidence for acute diverticulitis. There is central mesenteric edema. Colon is normal in caliber. Vascular/Lymphatic: No significant vascular findings are present. No enlarged abdominal or pelvic lymph nodes. Reproductive: Uterus is enlarged and contains numerous low-attenuation lesions, suggestive of degenerating fibroids. No adnexal mass. Other: No free pelvic fluid. Mesenteric ascites. Edematous changes identified within the anterior abdominal wall. Postoperative changes in the anterior abdomen. Musculoskeletal: No acute or significant osseous findings. IMPRESSION: 1. Interval resection of abnormal small bowel loop in the RIGHT LOWER QUADRANT. 2. Persistent dilated loops of small bowel in the central abdomen, suggestive of adhesions, and consistent with small bowel obstruction. 3. No evidence for anastomotic leak. 4. Mesenteric edema. 5. New consolidation at the RIGHT lung base. 6. Cholelithiasis. 7. Enlarged uterus containing numerous low-attenuation lesions, suggestive of degenerating fibroids. Electronically Signed   By: Nolon Nations M.D.   On: 05/24/2018 16:06    Anti-infectives: Anti-infectives (From admission, onward)   Start     Dose/Rate Route Frequency Ordered Stop   05/19/18 1400  vancomycin (VANCOCIN) 1,250 mg in sodium chloride 0.9 % 250 mL IVPB  Status:  Discontinued     1,250 mg 166.7 mL/hr over 90 Minutes Intravenous Every 24 hours 05/18/18 1343 05/19/18 1244   05/19/18 1400  meropenem (MERREM) 2 g in sodium chloride 0.9 % 100 mL IVPB     2 g 200 mL/hr over 30 Minutes Intravenous Every 8 hours 05/19/18 1244 05/26/18 2359   05/18/18 1600  cefTRIAXone (ROCEPHIN) 2 g in sodium chloride 0.9 % 100 mL IVPB  Status:  Discontinued     2 g 200 mL/hr over 30 Minutes Intravenous Every 24 hours 05/18/18 1326 05/19/18 1244   05/18/18 1400  vancomycin (VANCOCIN) 2,000 mg in sodium chloride 0.9 % 500 mL IVPB     2,000  mg 250 mL/hr over 120 Minutes Intravenous  Once 05/18/18 1343 05/18/18 1640   05/15/18 2333  ceFEPIme (MAXIPIME) 2 g in sodium chloride 0.9 % 100 mL IVPB  Status:  Discontinued     2 g 200 mL/hr over 30 Minutes Intravenous Every 8 hours 05/15/18 2226 05/18/18 1326   05/15/18 1500  ceFEPIme (MAXIPIME) 1 g in sodium chloride 0.9 % 100 mL IVPB  Status:  Discontinued     1 g 200 mL/hr over 30 Minutes Intravenous Every 24 hours 05/14/18 1512 05/15/18 2226   05/15/18 0000  anidulafungin (ERAXIS) 100 mg in sodium chloride 0.9 % 100 mL IVPB  Status:  Discontinued     100 mg 78 mL/hr over 100 Minutes Intravenous Every 24 hours 05/14/18 1728 05/15/18 1159   05/14/18 1800  anidulafungin (ERAXIS) 200 mg in sodium chloride 0.9 % 200 mL IVPB     200 mg 78 mL/hr over 200 Minutes Intravenous  Once 05/14/18 1751 05/14/18 2138   05/14/18 1745  vancomycin (VANCOCIN) 2,000 mg in sodium chloride 0.9 % 500 mL IVPB  Status:  Discontinued     2,000 mg 250 mL/hr over 120 Minutes Intravenous  Once 05/14/18 1741 05/15/18 1159   05/14/18 1741  vancomycin variable dose  per unstable renal function (pharmacist dosing)  Status:  Discontinued      Does not apply See admin instructions 05/14/18 1741 05/15/18 1159   05/14/18 1515  ceFEPIme (MAXIPIME) 2 g in sodium chloride 0.9 % 100 mL IVPB     2 g 200 mL/hr over 30 Minutes Intravenous  Once 05/14/18 1512 05/14/18 1649   05/14/18 1515  metroNIDAZOLE (FLAGYL) IVPB 500 mg  Status:  Discontinued     500 mg 100 mL/hr over 60 Minutes Intravenous Every 8 hours 05/14/18 1512 05/19/18 1244       Assessment/Plan DKA- per medicine, improving  Sepsis- per primary  SBO,Pneumoperitoneum, intussusception with necrosis & perforation - S/P ex lap, small bowel resection, closure with abthera vac, Dr. Grandville Silos, 01/17 -S/PExploratorylaparotomy with small bowel anastomosis and closure of abdominal fascia, Dr. Brantley Stage, 01/29 - NGTcame out 02/05  - CT scan 2/6 showing ileus vs  SBO, anastomosis intact, no abscess - NG tube if she vomits - continue broad abx coverage - continue TPN - Xray this AM  - IS  FEN:NPO, TPN VTE: SCD's, startedlovenox 01/30 MG:NOIBBC48/88-91/69, Vanc 01/27; 01/31>>,Cefepime &Flagyl 01/27>>afebrile, blood cultures 01/29NGTD, peritoneal fluid cultures showed Enterococcus and Clostridium, WBC 13.5 Foley:DC02/05 Follow up:TBD  Plan: Follow up on xray. She is passing BMs but continues to feel distended. No N/V. Will follow up on xray and possibly advance to clears later today pending results.    LOS: 12 days    Jillyn Ledger , [facility] 05/26/2018, 9:00 AM Pager: [phone]

## 2018-05-26 NOTE — Progress Notes (Signed)
Wheezing auscultated in upper lobes bilaterally. Pt denied SOB and SpO2 is 100% on RA. NP on call paged.

## 2018-05-27 ENCOUNTER — Inpatient Hospital Stay (HOSPITAL_COMMUNITY): Payer: 59

## 2018-05-27 LAB — CBC
HCT: 26.6 % — ABNORMAL LOW (ref 36.0–46.0)
Hemoglobin: 8.1 g/dL — ABNORMAL LOW (ref 12.0–15.0)
MCH: 25.9 pg — ABNORMAL LOW (ref 26.0–34.0)
MCHC: 30.5 g/dL (ref 30.0–36.0)
MCV: 85 fL (ref 80.0–100.0)
Platelets: 325 10*3/uL (ref 150–400)
RBC: 3.13 MIL/uL — ABNORMAL LOW (ref 3.87–5.11)
RDW: 17.5 % — AB (ref 11.5–15.5)
WBC: 10.4 10*3/uL (ref 4.0–10.5)
nRBC: 1.1 % — ABNORMAL HIGH (ref 0.0–0.2)

## 2018-05-27 LAB — LACTIC ACID, PLASMA: Lactic Acid, Venous: 1.5 mmol/L (ref 0.5–1.9)

## 2018-05-27 LAB — BASIC METABOLIC PANEL
Anion gap: 12 (ref 5–15)
BUN: 15 mg/dL (ref 6–20)
CO2: 25 mmol/L (ref 22–32)
Calcium: 8 mg/dL — ABNORMAL LOW (ref 8.9–10.3)
Chloride: 103 mmol/L (ref 98–111)
Creatinine, Ser: 0.74 mg/dL (ref 0.44–1.00)
GFR calc Af Amer: >60 mL/min (ref >60)
GFR calc non Af Amer: >60 mL/min (ref >60)
Glucose, Bld: 132 mg/dL — ABNORMAL HIGH (ref 70–99)
Potassium: 3.2 mmol/L — ABNORMAL LOW (ref 3.5–5.1)
Sodium: 140 mmol/L (ref 135–145)

## 2018-05-27 LAB — GLUCOSE, CAPILLARY
Glucose-Capillary: 101 mg/dL — ABNORMAL HIGH (ref 70–99)
Glucose-Capillary: 138 mg/dL — ABNORMAL HIGH (ref 70–99)
Glucose-Capillary: 140 mg/dL — ABNORMAL HIGH (ref 70–99)
Glucose-Capillary: 149 mg/dL — ABNORMAL HIGH (ref 70–99)
Glucose-Capillary: 153 mg/dL — ABNORMAL HIGH (ref 70–99)
Glucose-Capillary: 157 mg/dL — ABNORMAL HIGH (ref 70–99)

## 2018-05-27 MED ORDER — LACTATED RINGERS IV SOLN
INTRAVENOUS | Status: DC
  Administered 2018-05-27: 19:00:00 via INTRAVENOUS

## 2018-05-27 MED ORDER — IBUPROFEN 600 MG PO TABS
600.0000 mg | ORAL_TABLET | Freq: Once | ORAL | Status: AC
  Administered 2018-05-27: 600 mg via ORAL
  Filled 2018-05-27: qty 1

## 2018-05-27 MED ORDER — VANCOMYCIN HCL 10 G IV SOLR
2500.0000 mg | Freq: Once | INTRAVENOUS | Status: AC
  Administered 2018-05-27: 2500 mg via INTRAVENOUS
  Filled 2018-05-27: qty 2500

## 2018-05-27 MED ORDER — VANCOMYCIN HCL 10 G IV SOLR
1250.0000 mg | Freq: Two times a day (BID) | INTRAVENOUS | Status: DC
  Administered 2018-05-28 – 2018-05-30 (×4): 1250 mg via INTRAVENOUS
  Filled 2018-05-27 (×5): qty 1250

## 2018-05-27 MED ORDER — IBUPROFEN 600 MG PO TABS
600.0000 mg | ORAL_TABLET | Freq: Four times a day (QID) | ORAL | Status: DC | PRN
  Administered 2018-05-27 – 2018-05-29 (×4): 600 mg via ORAL
  Filled 2018-05-27 (×4): qty 1

## 2018-05-27 MED ORDER — GUAIFENESIN ER 600 MG PO TB12
600.0000 mg | ORAL_TABLET | Freq: Two times a day (BID) | ORAL | Status: DC
  Administered 2018-05-27 – 2018-06-04 (×16): 600 mg via ORAL
  Filled 2018-05-27 (×16): qty 1

## 2018-05-27 MED ORDER — TRAVASOL 10 % IV SOLN
INTRAVENOUS | Status: AC
  Administered 2018-05-27: 18:00:00 via INTRAVENOUS
  Filled 2018-05-27: qty 1274.4

## 2018-05-27 MED ORDER — POTASSIUM CHLORIDE 10 MEQ/50ML IV SOLN
10.0000 meq | INTRAVENOUS | Status: AC
  Administered 2018-05-27 (×5): 10 meq via INTRAVENOUS
  Filled 2018-05-27 (×5): qty 50

## 2018-05-27 MED ORDER — LACTATED RINGERS IV BOLUS
500.0000 mL | Freq: Once | INTRAVENOUS | Status: AC
  Administered 2018-05-27: 500 mL via INTRAVENOUS

## 2018-05-27 MED ORDER — SODIUM CHLORIDE 0.9 % IV SOLN
2.0000 g | Freq: Three times a day (TID) | INTRAVENOUS | Status: DC
  Administered 2018-05-27 – 2018-05-30 (×8): 2 g via INTRAVENOUS
  Filled 2018-05-27 (×11): qty 2

## 2018-05-27 MED ORDER — METOPROLOL TARTRATE 5 MG/5ML IV SOLN
5.0000 mg | Freq: Once | INTRAVENOUS | Status: AC
  Administered 2018-05-27: 5 mg via INTRAVENOUS
  Filled 2018-05-27: qty 5

## 2018-05-27 NOTE — Progress Notes (Signed)
Pharmacy Antibiotic Note  Wendy [name] is a 58 y.o. female admitted on 05/14/2018 with bowel perforation.  Pharmacy has been consulted to resume meropenem for HCAP given new fevers. Tm 102.9.   Plan: -Meropenem 1 gm IV Q 8 hours  Follow up cultures, LOT, de-escalation as able Recultured today - f/u repeat culture results   Height: 5\' 3"  (160 cm) Weight: 259 lb 4.2 oz (117.6 kg) IBW/kg (Calculated) : 52.4  Temp (24hrs), Avg:100.8 F (38.2 C), Min:98.7 F (37.1 C), Max:102.9 F (39.4 C)  Recent Labs  Lab 05/22/18 0500  05/23/18 0332 05/24/18 0515 05/25/18 0352 05/26/18 0320 05/27/18 0354  WBC  --    < > 18.6* 21.0* 17.1* 13.5* 10.4  CREATININE 0.77  --  0.72 0.77  --  0.62 0.74   < > = values in this interval not displayed.    Estimated Creatinine Clearance: 95 mL/min (by C-G formula based on SCr of 0.74 mg/dL).    Allergies  Allergen Reactions  . Penicillins Swelling    Did it involve swelling of the face/tongue/throat, SOB, or low BP? Yes Did it involve sudden or severe rash/hives, skin peeling, or any reaction on the inside of your mouth or nose? No Did you need to seek medical attention at a hospital or doctor's office? Yes When did it last happen? unknown If all above answers are "NO", may proceed with cephalosporin use.     Antimicrobials this admission: Eraxis 1/27 >> 1/28 Cefepime 1/27>> 1/31 Ceftriaxone 1/31 x 1 Metronidazole 1/27>> 2/1 Vanc 1/31 x 1 Meropenem 2/1 >> 2/8;2/9>>   Microbiology results: Peritoneal 1/27 >> E Faecium and clostridium sordelli (ok for merrem)  Bcx 1/29 >> GVR (grew in bottle nothing on plate) Ucx 1/27 >> negative MRSA Pcr >> negative  Albertina Parr, PharmD., BCPS Clinical Pharmacist Clinical phone for 05/27/18 until 8:30pm: [phone] If after 8:30pm, please refer to [facility] for unit-specific pharmacist

## 2018-05-27 NOTE — Progress Notes (Signed)
PROGRESS NOTE        PATIENT DETAILS Name: Wendy [name] Age: 58 y.o. Sex: female Date of Birth: 1960/11/08 Admit Date: 05/14/2018 Admitting Physician Rush Farmer, MD [id number], Drue Stager, MD  Brief Narrative: Patient is a 58 y.o. female with history of DM-2, pretension, GERD, morbid obesity who presented with a 3-day history of vomiting and abdominal pain-found to have sepsis secondary to small bowel obstruction and small perforation, along with DKA.  She was started on IV insulin, empiric antimicrobial therapy-General surgery was consulted-and patient was admitted to the ICU under the PCCM service.  Significant Events: 1/27 Admit > OR for ex lap with small bowel resection> ICU 1/29 OR for anastomosis and closure 2/1 Extubated 2/2 Hypoactive delirium, worsening respiratory status  Subjective: Coughing this morning-Per RN and nursing tech-patient refused to be ambulated yesterday.  Febrile this morning.  Assessment/Plan: Acute hypoxic respiratory failure: Extubated on 2/1 (intubated for surgery) continue to wean to RA as able.  Septic shock secondary to peritonitis with bowel obstruction along with Clostridium bacteremia: Sepsis pathophysiology has resolved, blood cultures positive for Clostridium species.  Peritoneal fluid cultures positive for enterococcus and Clostridium as well.  Has completed a course of meropenem till 2/8.   Small bowel obstruction with perforation s/p resection and closure of the abdominal fascia: Postop course prolonged and complicated by ileus-CT abdomen on 2/6 continues to show dilated loops of small bowel.  General surgery following-recommendations are to continue supportive care and n.p.o. status till ileus resolves.  Remains on TNA.  Fever: Suspect secondary to aspiration pneumonia/lung collapse-repeat blood cultures today, remains on empiric Unasyn.  Needs to be mobilized-counseled extensively-see below  Right middle lobe lung  collapse/pneumonia: Coughing-continues to have some transmitted upper airway sounds-continue bronchodilators/Mucomyst nebulizers, continue Unasyn.  Needs to be mobilized-out of bed to chair/ambulation.  Have encouraged use of incentive spirometry/flutter valve, chest PT.  Have encouraged patient to work with nursing staff when they attempt to move her.    Anemia: Likely anemia secondary to acute illness/critical illness-no evidence of blood loss.  Hemoglobin stable.  Follow periodically.    Hypernatremia: Resolved  Type II DM with uncontrolled hyperglycemia: Had severe hyperglycemia with probable DKA on admission-CBG stable with Lantus 24 units daily and SSI.  Follow and optimize.    Hypertension: Blood pressure relatively well controlled-continue to monitor off antihypertensives-once oral intake has resumed we can resume her blood pressure medications.    GERD: Continue PPI  Dyslipidemia: Resume statin when oral intake is further stabilized  Debility/deconditioning: Secondary to acute illness/critical illness-CIR when more stable over the next few days.  DVT Prophylaxis: Prophylactic Lovenox   Code Status: Full code   Family Communication: None at bedside  Disposition Plan: Remain inpatient-suspect CIR on discharge  Antimicrobial agents: Anti-infectives (From admission, onward)   Start     Dose/Rate Route Frequency Ordered Stop   05/26/18 1300  Ampicillin-Sulbactam (UNASYN) 3 g in sodium chloride 0.9 % 100 mL IVPB     3 g 200 mL/hr over 30 Minutes Intravenous Every 6 hours 05/26/18 1234     05/19/18 1400  vancomycin (VANCOCIN) 1,250 mg in sodium chloride 0.9 % 250 mL IVPB  Status:  Discontinued     1,250 mg 166.7 mL/hr over 90 Minutes Intravenous Every 24 hours 05/18/18 1343 05/19/18 1244   05/19/18 1400  meropenem (MERREM) 2 g in sodium chloride  0.9 % 100 mL IVPB     2 g 200 mL/hr over 30 Minutes Intravenous Every 8 hours 05/19/18 1244 05/26/18 2359   05/18/18 1600   cefTRIAXone (ROCEPHIN) 2 g in sodium chloride 0.9 % 100 mL IVPB  Status:  Discontinued     2 g 200 mL/hr over 30 Minutes Intravenous Every 24 hours 05/18/18 1326 05/19/18 1244   05/18/18 1400  vancomycin (VANCOCIN) 2,000 mg in sodium chloride 0.9 % 500 mL IVPB     2,000 mg 250 mL/hr over 120 Minutes Intravenous  Once 05/18/18 1343 05/18/18 1640   05/15/18 2333  ceFEPIme (MAXIPIME) 2 g in sodium chloride 0.9 % 100 mL IVPB  Status:  Discontinued     2 g 200 mL/hr over 30 Minutes Intravenous Every 8 hours 05/15/18 2226 05/18/18 1326   05/15/18 1500  ceFEPIme (MAXIPIME) 1 g in sodium chloride 0.9 % 100 mL IVPB  Status:  Discontinued     1 g 200 mL/hr over 30 Minutes Intravenous Every 24 hours 05/14/18 1512 05/15/18 2226   05/15/18 0000  anidulafungin (ERAXIS) 100 mg in sodium chloride 0.9 % 100 mL IVPB  Status:  Discontinued     100 mg 78 mL/hr over 100 Minutes Intravenous Every 24 hours 05/14/18 1728 05/15/18 1159   05/14/18 1800  anidulafungin (ERAXIS) 200 mg in sodium chloride 0.9 % 200 mL IVPB     200 mg 78 mL/hr over 200 Minutes Intravenous  Once 05/14/18 1751 05/14/18 2138   05/14/18 1745  vancomycin (VANCOCIN) 2,000 mg in sodium chloride 0.9 % 500 mL IVPB  Status:  Discontinued     2,000 mg 250 mL/hr over 120 Minutes Intravenous  Once 05/14/18 1741 05/15/18 1159   05/14/18 1741  vancomycin variable dose per unstable renal function (pharmacist dosing)  Status:  Discontinued      Does not apply See admin instructions 05/14/18 1741 05/15/18 1159   05/14/18 1515  ceFEPIme (MAXIPIME) 2 g in sodium chloride 0.9 % 100 mL IVPB     2 g 200 mL/hr over 30 Minutes Intravenous  Once 05/14/18 1512 05/14/18 1649   05/14/18 1515  metroNIDAZOLE (FLAGYL) IVPB 500 mg  Status:  Discontinued     500 mg 100 mL/hr over 60 Minutes Intravenous Every 8 hours 05/14/18 1512 05/19/18 1244      Procedures: 2/6>>PICC line 1/29>> exploratory laparotomy with small bowel anastomosis and closure of abdominal  fascia 1/27>>Right IJ (Anesthesia) 1/27>>EXPLORATORY LAPAROTOMY,SMALL BOWEL RESECTION,CLOSURE WITH ABTHERA VAC  CONSULTS:  pulmonary/intensive care and general surgery  Time spent: 25- minutes-Greater than 50% of this time was spent in counseling, explanation of diagnosis, planning of further management, and coordination of care.  MEDICATIONS: Scheduled Meds: . acetylcysteine  4 mL Nebulization TID  . chlorhexidine  15 mL Mouth Rinse BID  . Chlorhexidine Gluconate Cloth  6 each Topical Daily  . enoxaparin (LOVENOX) injection  40 mg Subcutaneous Q24H  . fluticasone  2 spray Each Nare Daily  . insulin aspart  0-20 Units Subcutaneous Q4H  . insulin glargine  24 Units Subcutaneous Daily  . ipratropium-albuterol  3 mL Nebulization Q6H  . mouth rinse  15 mL Mouth Rinse BID  . pantoprazole (PROTONIX) IV  40 mg Intravenous Q24H  . sodium chloride flush  10-40 mL Intracatheter Q12H  . venlafaxine XR  150 mg Oral Daily   Continuous Infusions: . ampicillin-sulbactam (UNASYN) IV 3 g (05/27/18 7672)  . dextrose 10 mL/hr at 05/25/18 1455  . potassium chloride 10 mEq (05/27/18  0930)  . TPN ADULT (ION) 90 mL/hr at 05/26/18 1819  . TPN ADULT (ION)     PRN Meds:.acetaminophen, alum & mag hydroxide-simeth, fentaNYL (SUBLIMAZE) injection, lip balm, sodium chloride flush, sodium chloride flush   PHYSICAL EXAM: Vital signs: Vitals:   05/27/18 0500 05/27/18 0637 05/27/18 0838 05/27/18 0912  BP:  (!) 142/79  (!) 156/68  Pulse:  91  (!) 106  Resp:  16    Temp:  98.7 F (37.1 C)  (!) 100.7 F (38.2 C)  TempSrc:  Oral  Oral  SpO2:  99% 97% 92%  Weight: 117.6 kg     Height:       Filed Weights   05/25/18 0601 05/26/18 0500 05/27/18 0500  Weight: 120.4 kg 117.4 kg 117.6 kg   Body mass index is 45.93 kg/m.   General appearance:Awake, alert, not in any distress.  Eyes:no scleral icterus. HEENT: Atraumatic and Normocephalic Neck: supple, no JVD. Resp:Good air entry bilaterally, some  scattered rhonchi-transmitted upper airway sounds-bibasilar rales right> left. CVS: S1 S2 regular, no murmurs.  GI: Bowel sounds present, Non tender and not distended with no gaurding, rigidity or rebound. Extremities: B/L Lower Ext shows no edema, both legs are warm to touch Neurology:  Non focal Psychiatric: Normal judgment and insight. Normal mood. Musculoskeletal:No digital cyanosis Skin:No Rash, warm and dry Wounds:N/A  I have personally reviewed following labs and imaging studies  LABORATORY DATA: CBC: Recent Labs  Lab 05/21/18 0644  05/23/18 0332 05/24/18 0515 05/25/18 0352 05/26/18 0320 05/27/18 0354  WBC 18.0*   < > 18.6* 21.0* 17.1* 13.5* 10.4  NEUTROABS 12.7*  --   --   --   --   --   --   HGB 8.3*   < > 8.7* 9.5* 8.6* 8.3* 8.1*  HCT 25.3*   < > 27.9* 30.0* 26.6* 26.4* 26.6*  MCV 81.6   < > 85.6 84.0 83.6 85.2 85.0  PLT 450*   < > 468* 502* 399 381 325   < > = values in this interval not displayed.    Basic Metabolic Panel: Recent Labs  Lab 05/20/18 2301 05/21/18 0644  05/22/18 0500 05/23/18 0332 05/24/18 0515 05/26/18 0320 05/27/18 0354  NA  --  155*   < > 153* 149* 141 141 140  K 5.2* 3.7   < > 3.6 4.0 3.8 3.5 3.2*  CL  --  121*   < > 118* 114* 105 105 103  CO2  --  25   < > 29 30 23 25 25   GLUCOSE  --  301*   < > 238* 213* 217* 125* 132*  BUN  --  17   < > 14 15 18 15 15   CREATININE  --  0.79   < > 0.77 0.72 0.77 0.62 0.74  CALCIUM  --  8.2*   < > 8.3* 8.5* 8.7* 8.3* 8.0*  MG  --  2.1  --  2.0  --  1.9  --   --   PHOS 2.1* 2.1*  --  2.9  --  2.7  --   --    < > = values in this interval not displayed.    GFR: Estimated Creatinine Clearance: 95 mL/min (by C-G formula based on SCr of 0.74 mg/dL).  Liver Function Tests: Recent Labs  Lab 05/21/18 0644 05/21/18 1453 05/24/18 0515  AST 14* 20 35  ALT 17 18 22   ALKPHOS 50 58 63  BILITOT 0.4 0.3 0.3  PROT  5.0* 5.7* 6.0*  ALBUMIN 1.8* 2.0* 2.1*   No results for input(s): LIPASE, AMYLASE in  the last 168 hours. No results for input(s): AMMONIA in the last 168 hours.  Coagulation Profile: No results for input(s): INR, PROTIME in the last 168 hours.  Cardiac Enzymes: No results for input(s): CKTOTAL, CKMB, CKMBINDEX, TROPONINI in the last 168 hours.  BNP (last 3 results) No results for input(s): PROBNP in the last 8760 hours.  HbA1C: No results for input(s): HGBA1C in the last 72 hours.  CBG: Recent Labs  Lab 05/26/18 1615 05/26/18 2131 05/27/18 0028 05/27/18 0511 05/27/18 0812  GLUCAP 107* 108* 140* 138* 101*    Lipid Profile: No results for input(s): CHOL, HDL, LDLCALC, TRIG, CHOLHDL, LDLDIRECT in the last 72 hours.  Thyroid Function Tests: No results for input(s): TSH, T4TOTAL, FREET4, T3FREE, THYROIDAB in the last 72 hours.  Anemia Panel: No results for input(s): VITAMINB12, FOLATE, FERRITIN, TIBC, IRON, RETICCTPCT in the last 72 hours.  Urine analysis:    Component Value Date/Time   COLORURINE YELLOW 05/14/2018 2014   APPEARANCEUR CLOUDY (A) 05/14/2018 2014   LABSPEC 1.023 05/14/2018 2014   PHURINE 5.0 05/14/2018 2014   GLUCOSEU >=500 (A) 05/14/2018 2014   HGBUR MODERATE (A) 05/14/2018 2014   BILIRUBINUR NEGATIVE 05/14/2018 2014   KETONESUR 20 (A) 05/14/2018 2014   PROTEINUR 100 (A) 05/14/2018 2014   NITRITE NEGATIVE 05/14/2018 2014   LEUKOCYTESUR TRACE (A) 05/14/2018 2014    Sepsis Labs: Lactic Acid, Venous    Component Value Date/Time   LATICACIDVEN 1.3 05/14/2018 2223    MICROBIOLOGY: Recent Results (from the past 240 hour(s))  Culture, blood (routine x 2)     Status: None (Preliminary result)   Collection Time: 05/24/18  7:20 PM  Result Value Ref Range Status   Specimen Description BLOOD LEFT ANTECUBITAL  Final   Special Requests   Final    BOTTLES DRAWN AEROBIC AND ANAEROBIC Blood Culture adequate volume   Culture   Final    NO GROWTH 2 DAYS Performed at [facility], [place] [address], [place], [place] [address]    Report  Status PENDING  Incomplete  Culture, blood (routine x 2)     Status: None (Preliminary result)   Collection Time: 05/24/18 10:28 PM  Result Value Ref Range Status   Specimen Description BLOOD LEFT THUMB  Final   Special Requests   Final    BOTTLES DRAWN AEROBIC ONLY Blood Culture adequate volume   Culture   Final    NO GROWTH 1 DAY Performed at [facility], [place] [address] [place], [place] [address]    Report Status PENDING  Incomplete    RADIOLOGY STUDIES/RESULTS: Ct Head Wo Contrast  Result Date: 05/18/2018 CLINICAL DATA:  Altered level of consciousness. Patient following commands intermittently. EXAM: CT HEAD WITHOUT CONTRAST TECHNIQUE: Contiguous axial images were obtained from the base of the skull through the vertex without intravenous contrast. COMPARISON:  None. FINDINGS: Brain: No evidence of acute infarction, hemorrhage, hydrocephalus, extra-axial collection or mass lesion/mass effect. Vascular: No hyperdense vessel or unexpected calcification. Skull: Normal. Negative for fracture or focal lesion. Sinuses/Orbits: Globes and orbits are unremarkable. There is mild mucosal thickening lining the ethmoid air cells and left sphenoid sinus with minimal right sphenoid sinus and maxillary sinus mucosal thickening. Other: None. IMPRESSION: 1. No intracranial abnormalities. 2. Sinus mucosal thickening. Electronically Signed   By: Lajean Manes M.D.   On: 05/18/2018 14:29   Ct Abdomen Pelvis W  Contrast  Result Date: 05/24/2018 CLINICAL DATA:  Postop course prolonged and complicated by ileus-on TNA-due to worsening leukocytosis. Patient underwent exploratory laparotomy on 05/16/2018 small bowel resection. Concern for ileus versus small bowel obstruction or abscess. Leukocytosis. Question of anastomotic leak. EXAM: CT ABDOMEN AND PELVIS WITH CONTRAST TECHNIQUE: Multidetector CT imaging of the abdomen and pelvis was performed using the standard protocol following bolus administration of  intravenous contrast. CONTRAST:  125mL OMNIPAQUE IOHEXOL 300 MG/ML  SOLN COMPARISON:  CT of the abdomen and pelvis on 05/14/2018 FINDINGS: Lower chest: There is new consolidation at the RIGHT LOWER lobe. Subsegmental atelectasis is identified at the LEFT lung base. Hepatobiliary: Small cysts within the liver measures 1.1 centimeters. The gallbladder is present and contains faintly radiopaque calculus or calculi. Pancreas: Unremarkable. No pancreatic ductal dilatation or surrounding inflammatory changes. Spleen: Normal in size without focal abnormality. Adrenals/Urinary Tract: Adrenal glands are unremarkable. Kidneys are normal, without renal calculi, focal lesion, or hydronephrosis. Bladder is unremarkable. Stomach/Bowel: The stomach is normal in appearance. There has been interval resection of abnormal small bowel loop in the RIGHT LOWER QUADRANT. The anastomosis in the RIGHT LOWER QUADRANT appears unremarkable. There are persistent dilated loops of small bowel particularly in the central abdomen showing multiple caliber changes, suggestive of adhesions. Numerous air-fluid levels are identified in the central small bowel loops. Although there is fluid in the mesentery and RIGHT LOWER QUADRANT, there is no evidence for anastomotic leak of contrast. No free intraperitoneal air. There are scattered colonic diverticula but no evidence for acute diverticulitis. There is central mesenteric edema. Colon is normal in caliber. Vascular/Lymphatic: No significant vascular findings are present. No enlarged abdominal or pelvic lymph nodes. Reproductive: Uterus is enlarged and contains numerous low-attenuation lesions, suggestive of degenerating fibroids. No adnexal mass. Other: No free pelvic fluid. Mesenteric ascites. Edematous changes identified within the anterior abdominal wall. Postoperative changes in the anterior abdomen. Musculoskeletal: No acute or significant osseous findings. IMPRESSION: 1. Interval resection of  abnormal small bowel loop in the RIGHT LOWER QUADRANT. 2. Persistent dilated loops of small bowel in the central abdomen, suggestive of adhesions, and consistent with small bowel obstruction. 3. No evidence for anastomotic leak. 4. Mesenteric edema. 5. New consolidation at the RIGHT lung base. 6. Cholelithiasis. 7. Enlarged uterus containing numerous low-attenuation lesions, suggestive of degenerating fibroids. Electronically Signed   By: Nolon Nations M.D.   On: 05/24/2018 16:06   Dg Chest Port 1 View  Result Date: 05/27/2018 CLINICAL DATA:  Shortness of breath and cough EXAM: PORTABLE CHEST 1 VIEW COMPARISON:  Yesterday FINDINGS: Streaky opacities at the bases with elevated right diaphragm. Cardiomegaly. No Kerley lines or pneumothorax. Unremarkable right upper extremity PICC position. IMPRESSION: No change in atelectatic opacities at the bases, more extensive on the right. Electronically Signed   By: Monte Fantasia M.D.   On: 05/27/2018 09:12   Dg Chest Port 1 View  Result Date: 05/17/2018 CLINICAL DATA:  Endotracheally intubated. EXAM: PORTABLE CHEST 1 VIEW COMPARISON:  05/15/2018 FINDINGS: Endotracheal tube terminates 2.5 cm above the carina. Enteric tube courses into the abdomen with tip not imaged and side hole below the diaphragm. Right jugular catheter terminates near the superior cavoatrial junction. The cardiomediastinal silhouette is unchanged with normal heart size. Mild bibasilar opacities have slightly improved. No sizable pleural effusion or pneumothorax is identified. IMPRESSION: Decreased, mild bibasilar atelectasis. Electronically Signed   By: Logan Bores M.D.   On: 05/17/2018 07:14   Dg Chest Port 1 View  Result Date: 05/15/2018 CLINICAL  DATA:  Ventilator support EXAM: PORTABLE CHEST 1 VIEW COMPARISON:  05/14/2018 FINDINGS: Endotracheal tube tip is 2 cm above the carina. Nasogastric tube enters the stomach. Right internal jugular central line tip is in the proximal right atrium.  No pneumothorax. Mild bilateral lower lobe atelectasis. IMPRESSION: Lines and tubes well positioned. Mild basilar atelectasis. Electronically Signed   By: Nelson Chimes M.D.   On: 05/15/2018 07:41   Dg Chest Portable 1 View  Result Date: 05/14/2018 CLINICAL DATA:  Abdominal pain for several days EXAM: PORTABLE CHEST 1 VIEW COMPARISON:  CT from earlier in the same day. FINDINGS: Elevation of the right hemidiaphragm is again seen. Cardiac shadow is within normal limits. Mild right basilar atelectatic changes are noted. No sizable effusion is seen. No bony abnormality is noted. IMPRESSION: Mild right basilar atelectasis. Electronically Signed   By: Inez Catalina M.D.   On: 05/14/2018 15:26   Dg Chest Port 1v Same Day  Result Date: 05/26/2018 CLINICAL DATA:  58 year old female with shortness of breath. Recent abdominal surgery. EXAM: PORTABLE CHEST 1 VIEW COMPARISON:  Prior chest x-ray 05/17/2018 FINDINGS: The patient has been extubated and the nasogastric tube removed. Additionally, the right IJ central venous catheter has been removed. A right upper extremity PICC has been placed. The catheter tip overlies the distal SVC. Dense band of linear airspace opacity in the right lung obscuring the right cardiac margin favored to represent right middle lobar collapse/atelectasis. Additionally, there is linear atelectasis in the left lower lobe. No evidence of pulmonary edema or pneumothorax. No acute osseous abnormality. IMPRESSION: 1. Multifocal atelectasis including right middle lobe collapse. This may be secondary to mucous plugging. 2. Well-positioned right upper extremity approach PICC. Electronically Signed   By: Jacqulynn Cadet M.D.   On: 05/26/2018 10:19   Dg Abd Portable 1v  Result Date: 05/26/2018 CLINICAL DATA:  58 year old female with abdominal distension and shortness of breath EXAM: PORTABLE ABDOMEN - 1 VIEW COMPARISON:  CT abdomen/pelvis 05/24/2018 FINDINGS: Persistent diffuse gaseous distention of  multiple loops of small bowel throughout the abdomen. No massive free air visualized on this supine radiograph. Lower lumbar degenerative disc disease again noted. IMPRESSION: Persistent gaseous distention of multiple loops of small bowel throughout the abdomen most consistent with ileus. Electronically Signed   By: Jacqulynn Cadet M.D.   On: 05/26/2018 10:18   Dg Abd Portable 1v  Result Date: 05/24/2018 CLINICAL DATA:  Initial evaluation for acute vomiting, small bowel obstruction. EXAM: PORTABLE ABDOMEN - 1 VIEW COMPARISON:  Prior CT from 05/14/2018. FINDINGS: Multiple dilated gas-filled loops of small bowel seen within the left and mid abdomen, compatible with ongoing small bowel obstruction. Loops dilated up to approximately 4 cm in diameter. Paucity of gas distally. No soft tissue mass or abnormal calcification. No appreciable free air on these limited supine views of the abdomen. IMPRESSION: Multiple persistent gas-filled dilated loops of small bowel within the left and mid abdomen, compatible with ongoing small bowel obstruction. Electronically Signed   By: Jeannine Boga M.D.   On: 05/24/2018 06:00   Ct Renal Stone Study  Result Date: 05/14/2018 CLINICAL DATA:  58 y/o F; nausea, vomiting, and abdominal pain for 3 days. EXAM: CT ABDOMEN AND PELVIS WITHOUT CONTRAST TECHNIQUE: Multidetector CT imaging of the abdomen and pelvis was performed following the standard protocol without IV contrast. COMPARISON:  None. FINDINGS: Lower chest: Platelike atelectasis in the lung bases. Hepatobiliary: No focal liver abnormality is seen. No gallstones, gallbladder wall thickening, or biliary dilatation. Pancreas: Unremarkable. No  pancreatic ductal dilatation or surrounding inflammatory changes. Spleen: Normal in size without focal abnormality. Adrenals/Urinary Tract: Adrenal glands are unremarkable. Kidneys are normal, without renal calculi, focal lesion, or hydronephrosis. Bladder is unremarkable.  Stomach/Bowel: Severe diffuse small-bowel obstruction with transition at the level of the distal ileum in the right lower quadrant (series 3, image 65). Mild sigmoid diverticulosis without findings of acute diverticulitis. Otherwise normal appearance of the colon. Vascular/Lymphatic: Aortic atherosclerosis. No enlarged abdominal or pelvic lymph nodes. Reproductive: Negative. Other: There are a few tiny foci of pneumoperitoneum at the liver hilum, anterior abdominal wall, and below the diaphragm (series 3, image 12, 24, 27). Small volume of ascites. Musculoskeletal: No fracture is seen. Lumbar spondylosis with prominent facet arthropathy. IMPRESSION: 1. Severe diffuse small bowel obstruction with transition level of distal ileum in right lower quadrant. 2. Few punctate foci of pneumoperitoneum indicating small/micro perforation. Small volume of ascites. These results were called by telephone at the time of interpretation on 05/14/2018 at 3:27 pm to Dr. Andy Gauss, who verbally acknowledged these results. Electronically Signed   By: Kristine Garbe M.D.   On: 05/14/2018 15:34   [place] Ekg Site Rite  Result Date: 05/23/2018 If Site Rite image not attached, placement could not be confirmed due to current cardiac rhythm.    LOS: 13 days   Oren Binet, MD  [facility]  If 7PM-7AM, please contact night-coverage  Please page via [email or web address]-Password [email or web address]-click on MD name and type text message  05/27/2018, 10:50 AM

## 2018-05-27 NOTE — Progress Notes (Signed)
Pt VS stable this morning, pt in no distress.

## 2018-05-27 NOTE — Progress Notes (Addendum)
Persistently febrile today, now tachycardic to the 130s.  Continues to have transmitted upper airway sounds-chest sounds congested as well.  Slighty lethargic but easily awake arousable-not in any distress-smiling and easily talking in full sentences.   Suspect PNA-prior imaging/chest x-ray was suggestive of right middle lobe collapse.  Plan: Restart IVF Stop Unasyn Restart meropenem, add vancomycin Await blood cultures done earlier this morning Continue attempts to mobilize, pulmonary toileting Repeat chest x-ray Monitor closely.

## 2018-05-27 NOTE — Progress Notes (Signed)
Tried to set patient up for chest vest she stated she didn't feel like doing it right now and would resume it tomorrow.

## 2018-05-27 NOTE — Progress Notes (Addendum)
[facility] CONSULT NOTE   Pharmacy Consult for TPN Indication: ileus  Patient Measurements: Height: 5\' 3"  (160 cm) Weight: 259 lb 4.2 oz (117.6 kg) IBW/kg (Calculated) : 52.4 TPN AdjBW (KG): 68.8 Body mass index is 45.93 kg/m. Usual Weight: 117.9 kg  Assessment:  58 y/o female presenting to the ED with 3 days of vomiting and abdominal pain found to be in DKA. CT showed SBO. Noted SBO, pneumoperitoneum, intussusception with necrosis and performation and was taken to the OR emergently for ex lap, SBR left in discontinuity, and abthera VAC on 1/27. Taken back to the OR on 1/29 for ex lap with SB anastomosis and fascia closure. High risk for refeeding.  Husband reports patient eating well prior to experiencing GI distress.  GI: SBO, pneumoperitoneum, intussusception with necrosis and perforation. Patient reports abdominal pain with ice chips. Albumin 2.1, prealbumin improving 10.1>14.4, NG removed 2/5. +flatus and  LBM 2/8.  PPI IV for GERD - CT scan 2/6 showing ileus vs SBO, anastomosis intact, no abscess  Endo: hx DM, CBGs improved (107-140) Insulin requirements in the past 24 hours: 9 units SSI, 55 units in TPN, and 24 units of Lantus  Lytes: K 3.5>3.2 lower today even though 4 K runs 2/8 (goal >= 4), Mag 1.9 (goal >=2).  Renal: AKI resolved, SCr stable Pulm: 2L [place] (Mucomyst nebs, Flonase, duonebs) Cards: h/o HLD, HTN Hepatobil: LFT and T bili WNL, TG 147 Neuro: Effexor ID: s/p meropenem for IAI ended 2/8, Afebrile, WBC 10.4 declining.  Now Unasyn started 2/8 for PNA. Spiked temp 102.9 last PM 2/8. 1/27: Clostridium Sordelli 1/27: Peritoneal fluid: Enterococcus faecium, Clostridium TPN Access: double lumen CVC placed 05/14/18 TPN start date: 05/19/18  Nutritional Goals (per RD recommendation on 05/19/18): KCal: 2100-2300 Protein: 116-128 g Fluid: >1.6L  Goal TPN rate is 90 ml/hr   Current Nutrition:  TPN NPO  Plan:  Continue TPN at  105mL/hr - This TPN provides 127 g of protein, 302 g of dextrose, and 58 g of lipids which provides 2,120 kCals per day, meeting 100% of patient needs Electrolytes in TPN: Increase K+ in TpN for goal>4 Add MVI, trace elements to TPN Regular insulin 55 units in TPN Continue resistant SSI Q4h and Lantus 24 units per MD Monitor TPN labs Mon/Thurs and prn D5W at 79ml/hr K runs x 5 this AM    Travonte Byard S. Alford Highland, PharmD, BCPS Clinical Staff Pharmacist 05/27/2018 7:27 AM

## 2018-05-27 NOTE — Progress Notes (Signed)
Pharmacy Antibiotic Note  Kida Digiulio is a 58 y.o. female admitted on 05/14/2018 with bowel perforation.  Pharmacy has been consulted to resume meropenem for HCAP given new fevers and also to start vancomycin -WBC= 10.4, tmax= 103.2, SCr= 0.74, CrCl ~ 95    Plan: -Vancomycin 2500mg  IV followed by 1250mg  IV q12h (AUC= 484 with SCr= 0.74) -Will follow renal function, cultures and clinical progress   Height: 5\' 3"  (160 cm) Weight: 259 lb 4.2 oz (117.6 kg) IBW/kg (Calculated) : 52.4  Temp (24hrs), Avg:101 F (38.3 C), Min:98.7 F (37.1 C), Max:103.2 F (39.6 C)  Recent Labs  Lab 05/22/18 0500  05/23/18 0332 05/24/18 0515 05/25/18 0352 05/26/18 0320 05/27/18 0354  WBC  --    < > 18.6* 21.0* 17.1* 13.5* 10.4  CREATININE 0.77  --  0.72 0.77  --  0.62 0.74   < > = values in this interval not displayed.    Estimated Creatinine Clearance: 95 mL/min (by C-G formula based on SCr of 0.74 mg/dL).    Allergies  Allergen Reactions  . Penicillins Swelling    Did it involve swelling of the face/tongue/throat, SOB, or low BP? Yes Did it involve sudden or severe rash/hives, skin peeling, or any reaction on the inside of your mouth or nose? No Did you need to seek medical attention at a hospital or doctor's office? Yes When did it last happen? unknown If all above answers are "NO", may proceed with cephalosporin use.     Antimicrobials this admission: Eraxis 1/27 >> 1/28 Cefepime 1/27>> 1/31 Ceftriaxone 1/31 x 1 Metronidazole 1/27>> 2/1 Vanc 1/31 x 1; 2/9>> Meropenem 2/1 >> 2/8;2/9>>   Microbiology results: Peritoneal 1/27 >> E Faecium and clostridium sordelli (ok for merrem)  Bcx 1/29 >> GVR (grew in bottle nothing on plate) Ucx 1/27 >> negative MRSA Pcr >> negative  Hildred Laser, PharmD Clinical Pharmacist **Pharmacist phone directory can now be found on [email or web address] ([email or web address]).  Listed under [facility].

## 2018-05-27 NOTE — Progress Notes (Signed)
11 Days Post-Op  Subjective: Up in chair.  Some abd pain relieved with meds. Reports severe nausea after water, none now.  Had BM yesterday    Objective: Vital signs in last 24 hours: Temp:  [98.7 F (37.1 C)-102.9 F (39.4 C)] 100.7 F (38.2 C) (02/09 0912) Pulse Rate:  [90-132] 106 (02/09 0912) Resp:  [16-20] 16 (02/09 0637) BP: (142-177)/(67-79) 156/68 (02/09 0912) SpO2:  [92 %-99 %] 92 % (02/09 0912) Weight:  [117.6 kg] 117.6 kg (02/09 0500) Last BM Date: 05/27/18  Intake/Output from previous day: 02/08 0701 - 02/09 0700 In: 620.3 [P.O.:20; IV Piggyback:600.3] Out: 450 [Urine:450] Intake/Output this shift: Total I/O In: -  Out: 1000 [Urine:1000]  PE: Gen: Up in chair, drowsey Pulm:rate and effort normal Abd: Soft,ND,fewBS appreciated, vac in place of midline,no tenderness.  Skin: no rashes noted, warm and dry  Lab Results:  Recent Labs    05/26/18 0320 05/27/18 0354  WBC 13.5* 10.4  HGB 8.3* 8.1*  HCT 26.4* 26.6*  PLT 381 325   BMET Recent Labs    05/26/18 0320 05/27/18 0354  NA 141 140  K 3.5 3.2*  CL 105 103  CO2 25 25  GLUCOSE 125* 132*  BUN 15 15  CREATININE 0.62 0.74  CALCIUM 8.3* 8.0*   PT/INR No results for input(s): LABPROT, INR in the last 72 hours. CMP     Component Value Date/Time   NA 140 05/27/2018 0354   NA 144 12/25/2014 1536   K 3.2 (L) 05/27/2018 0354   CL 103 05/27/2018 0354   CO2 25 05/27/2018 0354   GLUCOSE 132 (H) 05/27/2018 0354   BUN 15 05/27/2018 0354   BUN 9 12/25/2014 1536   CREATININE 0.74 05/27/2018 0354   CREATININE 0.69 05/25/2017 1150   CALCIUM 8.0 (L) 05/27/2018 0354   PROT 6.0 (L) 05/24/2018 0515   PROT 6.7 12/25/2014 1536   ALBUMIN 2.1 (L) 05/24/2018 0515   ALBUMIN 4.4 12/25/2014 1536   AST 35 05/24/2018 0515   ALT 22 05/24/2018 0515   ALKPHOS 63 05/24/2018 0515   BILITOT 0.3 05/24/2018 0515   BILITOT 0.3 12/25/2014 1536   GFRNONAA >60 05/27/2018 0354   GFRNONAA 78 08/25/2016 1100   GFRAA >60 05/27/2018 0354   GFRAA >89 08/25/2016 1100   Lipase     Component Value Date/Time   LIPASE 37 05/14/2018 2014       Studies/Results: Dg Chest Port 1 View  Result Date: 05/27/2018 CLINICAL DATA:  Shortness of breath and cough EXAM: PORTABLE CHEST 1 VIEW COMPARISON:  Yesterday FINDINGS: Streaky opacities at the bases with elevated right diaphragm. Cardiomegaly. No Kerley lines or pneumothorax. Unremarkable right upper extremity PICC position. IMPRESSION: No change in atelectatic opacities at the bases, more extensive on the right. Electronically Signed   By: Monte Fantasia M.D.   On: 05/27/2018 09:12   Dg Chest Port 1v Same Day  Result Date: 05/26/2018 CLINICAL DATA:  58 year old female with shortness of breath. Recent abdominal surgery. EXAM: PORTABLE CHEST 1 VIEW COMPARISON:  Prior chest x-ray 05/17/2018 FINDINGS: The patient has been extubated and the nasogastric tube removed. Additionally, the right IJ central venous catheter has been removed. A right upper extremity PICC has been placed. The catheter tip overlies the distal SVC. Dense band of linear airspace opacity in the right lung obscuring the right cardiac margin favored to represent right middle lobar collapse/atelectasis. Additionally, there is linear atelectasis in the left lower lobe. No evidence of pulmonary edema or  pneumothorax. No acute osseous abnormality. IMPRESSION: 1. Multifocal atelectasis including right middle lobe collapse. This may be secondary to mucous plugging. 2. Well-positioned right upper extremity approach PICC. Electronically Signed   By: Jacqulynn Cadet M.D.   On: 05/26/2018 10:19   Dg Abd Portable 1v  Result Date: 05/26/2018 CLINICAL DATA:  58 year old female with abdominal distension and shortness of breath EXAM: PORTABLE ABDOMEN - 1 VIEW COMPARISON:  CT abdomen/pelvis 05/24/2018 FINDINGS: Persistent diffuse gaseous distention of multiple loops of small bowel throughout the abdomen. No massive  free air visualized on this supine radiograph. Lower lumbar degenerative disc disease again noted. IMPRESSION: Persistent gaseous distention of multiple loops of small bowel throughout the abdomen most consistent with ileus. Electronically Signed   By: Jacqulynn Cadet M.D.   On: 05/26/2018 10:18    Anti-infectives: Anti-infectives (From admission, onward)   Start     Dose/Rate Route Frequency Ordered Stop   05/26/18 1300  Ampicillin-Sulbactam (UNASYN) 3 g in sodium chloride 0.9 % 100 mL IVPB     3 g 200 mL/hr over 30 Minutes Intravenous Every 6 hours 05/26/18 1234     05/19/18 1400  vancomycin (VANCOCIN) 1,250 mg in sodium chloride 0.9 % 250 mL IVPB  Status:  Discontinued     1,250 mg 166.7 mL/hr over 90 Minutes Intravenous Every 24 hours 05/18/18 1343 05/19/18 1244   05/19/18 1400  meropenem (MERREM) 2 g in sodium chloride 0.9 % 100 mL IVPB     2 g 200 mL/hr over 30 Minutes Intravenous Every 8 hours 05/19/18 1244 05/26/18 2359   05/18/18 1600  cefTRIAXone (ROCEPHIN) 2 g in sodium chloride 0.9 % 100 mL IVPB  Status:  Discontinued     2 g 200 mL/hr over 30 Minutes Intravenous Every 24 hours 05/18/18 1326 05/19/18 1244   05/18/18 1400  vancomycin (VANCOCIN) 2,000 mg in sodium chloride 0.9 % 500 mL IVPB     2,000 mg 250 mL/hr over 120 Minutes Intravenous  Once 05/18/18 1343 05/18/18 1640   05/15/18 2333  ceFEPIme (MAXIPIME) 2 g in sodium chloride 0.9 % 100 mL IVPB  Status:  Discontinued     2 g 200 mL/hr over 30 Minutes Intravenous Every 8 hours 05/15/18 2226 05/18/18 1326   05/15/18 1500  ceFEPIme (MAXIPIME) 1 g in sodium chloride 0.9 % 100 mL IVPB  Status:  Discontinued     1 g 200 mL/hr over 30 Minutes Intravenous Every 24 hours 05/14/18 1512 05/15/18 2226   05/15/18 0000  anidulafungin (ERAXIS) 100 mg in sodium chloride 0.9 % 100 mL IVPB  Status:  Discontinued     100 mg 78 mL/hr over 100 Minutes Intravenous Every 24 hours 05/14/18 1728 05/15/18 1159   05/14/18 1800  anidulafungin  (ERAXIS) 200 mg in sodium chloride 0.9 % 200 mL IVPB     200 mg 78 mL/hr over 200 Minutes Intravenous  Once 05/14/18 1751 05/14/18 2138   05/14/18 1745  vancomycin (VANCOCIN) 2,000 mg in sodium chloride 0.9 % 500 mL IVPB  Status:  Discontinued     2,000 mg 250 mL/hr over 120 Minutes Intravenous  Once 05/14/18 1741 05/15/18 1159   05/14/18 1741  vancomycin variable dose per unstable renal function (pharmacist dosing)  Status:  Discontinued      Does not apply See admin instructions 05/14/18 1741 05/15/18 1159   05/14/18 1515  ceFEPIme (MAXIPIME) 2 g in sodium chloride 0.9 % 100 mL IVPB     2 g 200 mL/hr over 30 Minutes  Intravenous  Once 05/14/18 1512 05/14/18 1649   05/14/18 1515  metroNIDAZOLE (FLAGYL) IVPB 500 mg  Status:  Discontinued     500 mg 100 mL/hr over 60 Minutes Intravenous Every 8 hours 05/14/18 1512 05/19/18 1244       Assessment/Plan DKA- per medicine, improving  Sepsis- per primary  SBO,Pneumoperitoneum, intussusception with necrosis & perforation - S/P ex lap, small bowel resection, closure with abthera vac, Dr. Grandville Silos, 01/17 -S/PExploratorylaparotomy with small bowel anastomosis and closure of abdominal fascia, Dr. Brantley Stage, 01/29 - NGTcame out 02/05  - CT scan 2/6 showing ileus vs SBO, anastomosis intact, no abscess -ileus-NPO x sips for now - continue broad abx coverage - continue TPN  - IS  FEN:NPO, TPN VTE: SCD's, startedlovenox 01/30 OH:[id number]/26-94/85, Vanc 01/27; 01/31>>,Cefepime &Flagyl 01/27>>afebrile, blood cultures 01/29NGTD, peritoneal fluid cultures showed Enterococcus and Clostridium, WBC 13.5 Foley:DC02/05 Follow up:TBD  Plan: Bowel rest until ileus resolves  LOS: 13 days    Edward Jolly , [facility] Surgery 05/27/2018, 10:26 AM Pager: [phone] Patient ID: Wendy [name], female   DOB: Mar 06, 1961, 58 y.o.   MRN: [id number]

## 2018-05-27 NOTE — Progress Notes (Addendum)
Pt temp 102.9, HR in 120s, BP elevated, RR 20, SpO2 93 on room air Tylenol PRN given. On reassessment pt temp 102.5, HR in 130s, BP 160/73, RR 20, SpO2 94 on room air. Pt has good output.  NP on call notified. Pt refused breathing treatment earlier, but agreed to get one at this time due to mild SOB, SpO2 92 on RA. Breathing treatment given and pt verbalized improvement. Pt placed on 2 L O2 nasal cannula. Importance of cough and deep breath, flutter valve and incentive spirometer explained to the pt. At this time pt used incentive spirometer 750 archived.  Pt needs to be reinforced to use above stated.

## 2018-05-27 NOTE — Progress Notes (Signed)
Patient Out of bed to chair ambulated to restroom. used IS and deep cough and breath. Patient has a weak cough. RT notified for chest physiotherapy.

## 2018-05-28 ENCOUNTER — Inpatient Hospital Stay (HOSPITAL_COMMUNITY): Payer: 59

## 2018-05-28 LAB — GLUCOSE, CAPILLARY
Glucose-Capillary: 145 mg/dL — ABNORMAL HIGH (ref 70–99)
Glucose-Capillary: 139 mg/dL — ABNORMAL HIGH (ref 70–99)
Glucose-Capillary: 144 mg/dL — ABNORMAL HIGH (ref 70–99)
Glucose-Capillary: 154 mg/dL — ABNORMAL HIGH (ref 70–99)
Glucose-Capillary: 151 mg/dL — ABNORMAL HIGH (ref 70–99)
Glucose-Capillary: 160 mg/dL — ABNORMAL HIGH (ref 70–99)
Glucose-Capillary: 126 mg/dL — ABNORMAL HIGH (ref 70–99)

## 2018-05-28 LAB — DIFFERENTIAL
ABS IMMATURE GRANULOCYTES: 0.09 10*3/uL — AB (ref 0.00–0.07)
BASOS ABS: 0 10*3/uL (ref 0.0–0.1)
Basophils Relative: 0 %
Eosinophils Absolute: 0 10*3/uL (ref 0.0–0.5)
Eosinophils Relative: 0 %
IMMATURE GRANULOCYTES: 1 %
Lymphocytes Relative: 13 %
Lymphs Abs: 1.1 10*3/uL (ref 0.7–4.0)
Monocytes Absolute: 0.8 10*3/uL (ref 0.1–1.0)
Monocytes Relative: 9 %
Neutro Abs: 6.2 10*3/uL (ref 1.7–7.7)
Neutrophils Relative %: 77 %

## 2018-05-28 LAB — CBC
HCT: 27.8 % — ABNORMAL LOW (ref 36.0–46.0)
Hemoglobin: 8.4 g/dL — ABNORMAL LOW (ref 12.0–15.0)
MCH: 25.7 pg — ABNORMAL LOW (ref 26.0–34.0)
MCHC: 30.2 g/dL (ref 30.0–36.0)
MCV: 85 fL (ref 80.0–100.0)
Platelets: 281 10*3/uL (ref 150–400)
RBC: 3.27 MIL/uL — ABNORMAL LOW (ref 3.87–5.11)
RDW: 17.3 % — ABNORMAL HIGH (ref 11.5–15.5)
WBC: 8.1 10*3/uL (ref 4.0–10.5)
nRBC: 0.2 % (ref 0.0–0.2)

## 2018-05-28 LAB — COMPREHENSIVE METABOLIC PANEL
ALT: 28 U/L (ref 0–44)
AST: 36 U/L (ref 15–41)
Albumin: 2 g/dL — ABNORMAL LOW (ref 3.5–5.0)
Alkaline Phosphatase: 78 U/L (ref 38–126)
Anion gap: 6 (ref 5–15)
BUN: 15 mg/dL (ref 6–20)
CO2: 24 mmol/L (ref 22–32)
Calcium: 7.6 mg/dL — ABNORMAL LOW (ref 8.9–10.3)
Chloride: 109 mmol/L (ref 98–111)
Creatinine, Ser: 0.71 mg/dL (ref 0.44–1.00)
GFR calc Af Amer: >60 mL/min (ref >60)
GFR calc non Af Amer: >60 mL/min (ref >60)
Glucose, Bld: 163 mg/dL — ABNORMAL HIGH (ref 70–99)
Potassium: 3.6 mmol/L (ref 3.5–5.1)
Sodium: 139 mmol/L (ref 135–145)
Total Bilirubin: 0.2 mg/dL — ABNORMAL LOW (ref 0.3–1.2)
Total Protein: 5.7 g/dL — ABNORMAL LOW (ref 6.5–8.1)

## 2018-05-28 LAB — PHOSPHORUS: Phosphorus: 1.9 mg/dL — ABNORMAL LOW (ref 2.5–4.6)

## 2018-05-28 LAB — PREALBUMIN: PREALBUMIN: 17.8 mg/dL — AB (ref 18–38)

## 2018-05-28 LAB — TRIGLYCERIDES: Triglycerides: 139 mg/dL (ref <150)

## 2018-05-28 LAB — MAGNESIUM: Magnesium: 2 mg/dL (ref 1.7–2.4)

## 2018-05-28 MED ORDER — IOPAMIDOL (ISOVUE-300) INJECTION 61%: 75.0000 mL | Freq: Once | INTRAVENOUS | Status: DC | PRN

## 2018-05-28 MED ORDER — IOHEXOL 300 MG/ML  SOLN
75.0000 mL | Freq: Once | INTRAMUSCULAR | Status: AC | PRN
  Administered 2018-05-28: 75 mL via INTRAVENOUS

## 2018-05-28 MED ORDER — POTASSIUM PHOSPHATES 15 MMOLE/5ML IV SOLN
20.0000 mmol | Freq: Once | INTRAVENOUS | Status: AC
  Administered 2018-05-28: 20 mmol via INTRAVENOUS
  Filled 2018-05-28: qty 6.67

## 2018-05-28 MED ORDER — IPRATROPIUM-ALBUTEROL 0.5-2.5 (3) MG/3ML IN SOLN
3.0000 mL | Freq: Four times a day (QID) | RESPIRATORY_TRACT | Status: DC
  Administered 2018-05-28 (×2): 3 mL via RESPIRATORY_TRACT
  Filled 2018-05-28 (×3): qty 3

## 2018-05-28 MED ORDER — TRAVASOL 10 % IV SOLN
INTRAVENOUS | Status: AC
  Administered 2018-05-28: 17:00:00 via INTRAVENOUS
  Filled 2018-05-28: qty 1274.4

## 2018-05-28 NOTE — Progress Notes (Signed)
Physical Therapy Treatment Patient Details Name: Wendy [name] MRN: [id number] DOB: December 13, 1960 Today's Date: 05/28/2018    History of Present Illness 58 year old female who presented with 3-day history of abdominal pain, emesis, anorexia, and polyuria for the past 3 days.  Labs notable hyperglycemia 771 with anion gap of 21.  CT abdomen pelvis showed small bowel obstruction with pneumoperitoneum and she underwent ex lap with small bowel resection.    PT Comments    Pt seated on commode on arrival with NT present.  Pt required encouragement to participate in session.  Transfers are much improved from last session at supervision.  Pt did require min guard during gait training with cues for pacing and posture.  HR elevated to 134 bpm and SPo2 on RA decreased to 88%. Plan next session for progression of functional mobility to patient tolerance.    Follow Up Recommendations  CIR     Equipment Recommendations  Other (comment)(TBA)    Recommendations for Other Services       Precautions / Restrictions Precautions Precautions: Fall;Other (comment) Precaution Comments: wound vac, NG tube, central line, foley Restrictions Weight Bearing Restrictions: No    Mobility  Bed Mobility Overal bed mobility: Needs Assistance Bed Mobility: Sit to Supine       Sit to supine: Mod assist;+2 for physical assistance   General bed mobility comments: Required assistance to lower trunk and lift B LEs back to bed against gravity.    Transfers Overall transfer level: Needs assistance Equipment used: Rolling walker (2 wheeled) Transfers: Sit to/from Stand Sit to Stand: Supervision         General transfer comment: Cues for hand placement to improve safety  Ambulation/Gait Ambulation/Gait assistance: Min guard;+2 safety/equipment Gait Distance (Feet): 50 Feet Assistive device: Rolling walker (2 wheeled) Gait Pattern/deviations: Step-to pattern;Shuffle;Trunk flexed     General Gait Details:  Cues for upper trunk control and RW safety.   Stairs             Wheelchair Mobility    Modified Rankin (Stroke Patients Only)       Balance Overall balance assessment: Needs assistance   Sitting balance-Leahy Scale: Fair       Standing balance-Leahy Scale: Poor                              Cognition Arousal/Alertness: Awake/alert Behavior During Therapy: Flat affect(slightly irritated we were there) Overall Cognitive Status: Within Functional Limits for tasks assessed                                        Exercises      General Comments        Pertinent Vitals/Pain Pain Assessment: 0-10 Pain Score: 4  Faces Pain Scale: Hurts a little bit Pain Location: abdomen Pain Descriptors / Indicators: Guarding Pain Intervention(s): Monitored during session;Repositioned    Home Living                      Prior Function            PT Goals (current goals can now be found in the care plan section) Acute Rehab PT Goals Patient Stated Goal: Return to independence Potential to Achieve Goals: Good Progress towards PT goals: Progressing toward goals    Frequency    Min 3X/week  PT Plan Current plan remains appropriate    Co-evaluation              AM-PAC PT "6 Clicks" Mobility   Outcome Measure  Help needed turning from your back to your side while in a flat bed without using bedrails?: A Lot Help needed moving from lying on your back to sitting on the side of a flat bed without using bedrails?: A Lot Help needed moving to and from a bed to a chair (including a wheelchair)?: A Little Help needed standing up from a chair using your arms (e.g., wheelchair or bedside chair)?: A Little Help needed to walk in hospital room?: A Little Help needed climbing 3-5 steps with a railing? : A Little 6 Click Score: 16    End of Session Equipment Utilized During Treatment: Oxygen;Gait belt Activity Tolerance:  Patient tolerated treatment well Patient left: with call bell/phone within reach Nurse Communication: Other (comment);Mobility status(HR and SPO2) PT Visit Diagnosis: Muscle weakness (generalized) (M62.81);Difficulty in walking, not elsewhere classified (R26.2);Pain     Time: 5945-8592 PT Time Calculation (min) (ACUTE ONLY): 31 min  Charges:  $Gait Training: 8-22 mins $Therapeutic Activity: 8-22 mins                     Governor Rooks, PTA Acute Rehabilitation Services Pager [phone] Office [phone]     Cristela Blue 05/28/2018, 5:43 PM

## 2018-05-28 NOTE — Progress Notes (Signed)
PROGRESS NOTE        PATIENT DETAILS Name: Wendy [name] Age: 58 y.o. Sex: female Date of Birth: [birth year] Admit Date: 05/14/2018 Admitting Physician Rush Farmer, MD DGL:OVFIEP, Drue Stager, MD  Brief Narrative: Patient is a 58 y.o. female with history of DM-2, pretension, GERD, morbid obesity who presented with a 3-day history of vomiting and abdominal pain-found to have sepsis secondary to small bowel obstruction and small perforation, along with DKA.  She was started on IV insulin, empiric antimicrobial therapy-General surgery was consulted-and patient was admitted to the ICU under the PCCM service.  Hospital course lately has been complicated by development of ileus, and pneumonia.  Significant Events: 1/27 Admit > OR for ex lap with small bowel resection> ICU 1/29 OR for anastomosis and closure 2/1 Extubated 2/2 Hypoactive delirium, worsening respiratory status  Subjective: Continues to cough-Per nursing staff-refuses to be mobilized/ambulated at times, and also has refused chest physiotherapy/vest.  Febrile overnight but fever curve is better.  Assessment/Plan: Acute hypoxic respiratory failure: Extubated on 2/1 (intubated for surgery) continue to wean to RA as able.  Septic shock secondary to peritonitis with bowel obstruction along with Clostridium bacteremia: Sepsis pathophysiology has resolved, blood cultures positive for Clostridium species.  Peritoneal fluid cultures positive for enterococcus and Clostridium as well.  Has completed a course of meropenem till 2/8.   Small bowel obstruction with perforation s/p resection and closure of the abdominal fascia with prolonged postop course due to ileus: Continues to have ileus-General surgery following and directing care.  Remains on TNA.  Right-sided pneumonia: Secondary to poor respiratory effort/cough effort-and refusal to be mobilized/ambulated.  She unfortunately has refused attempts at chest PT with  vibrating vest as well.  Have counseled extensively-continues to be febrile but fever curve better than yesterday-continue empiric vancomycin and meropenem.  Encourage use of incentive spirometry/flutter valve/vibratory vest and ambulation.    Anemia: Secondary to acute/critical illness-no evidence of blood loss.  Hemoglobin low but stable.  Follow.    Hypernatremia: Resolved  Type II DM with uncontrolled hyperglycemia: Had severe hyperglycemia with probable DKA on admission-CBGs continue to be stable with Lantus 24 units daily and SSI.  Follow and optimize.    Hypertension: Blood pressure relatively well controlled-continue to monitor off antihypertensives-once oral intake has resumed we can resume her blood pressure medications.    GERD: Continue PPI  Dyslipidemia: Resume statin when oral intake is further stabilized  Debility/deconditioning: Secondary to acute illness/critical illness-CIR when more stable over the next few days.  DVT Prophylaxis: Prophylactic Lovenox   Code Status: Full code   Family Communication: None at bedside  Disposition Plan: Remain inpatient-suspect CIR on discharge  Antimicrobial agents: Anti-infectives (From admission, onward)   Start     Dose/Rate Route Frequency Ordered Stop   05/28/18 0500  vancomycin (VANCOCIN) 1,250 mg in sodium chloride 0.9 % 250 mL IVPB     1,250 mg 166.7 mL/hr over 90 Minutes Intravenous Every 12 hours 05/27/18 1612     05/27/18 1700  vancomycin (VANCOCIN) 2,500 mg in sodium chloride 0.9 % 500 mL IVPB     2,500 mg 250 mL/hr over 120 Minutes Intravenous  Once 05/27/18 1612 05/27/18 1922   05/27/18 1600  meropenem (MERREM) 2 g in sodium chloride 0.9 % 100 mL IVPB     2 g 200 mL/hr over 30 Minutes Intravenous Every 8 hours  05/27/18 1544 06/04/18 0759   05/26/18 1300  Ampicillin-Sulbactam (UNASYN) 3 g in sodium chloride 0.9 % 100 mL IVPB  Status:  Discontinued     3 g 200 mL/hr over 30 Minutes Intravenous Every 6 hours  05/26/18 1234 05/27/18 1538   05/19/18 1400  vancomycin (VANCOCIN) 1,250 mg in sodium chloride 0.9 % 250 mL IVPB  Status:  Discontinued     1,250 mg 166.7 mL/hr over 90 Minutes Intravenous Every 24 hours 05/18/18 1343 05/19/18 1244   05/19/18 1400  meropenem (MERREM) 2 g in sodium chloride 0.9 % 100 mL IVPB     2 g 200 mL/hr over 30 Minutes Intravenous Every 8 hours 05/19/18 1244 05/26/18 2359   05/18/18 1600  cefTRIAXone (ROCEPHIN) 2 g in sodium chloride 0.9 % 100 mL IVPB  Status:  Discontinued     2 g 200 mL/hr over 30 Minutes Intravenous Every 24 hours 05/18/18 1326 05/19/18 1244   05/18/18 1400  vancomycin (VANCOCIN) 2,000 mg in sodium chloride 0.9 % 500 mL IVPB     2,000 mg 250 mL/hr over 120 Minutes Intravenous  Once 05/18/18 1343 05/18/18 1640   05/15/18 2333  ceFEPIme (MAXIPIME) 2 g in sodium chloride 0.9 % 100 mL IVPB  Status:  Discontinued     2 g 200 mL/hr over 30 Minutes Intravenous Every 8 hours 05/15/18 2226 05/18/18 1326   05/15/18 1500  ceFEPIme (MAXIPIME) 1 g in sodium chloride 0.9 % 100 mL IVPB  Status:  Discontinued     1 g 200 mL/hr over 30 Minutes Intravenous Every 24 hours 05/14/18 1512 05/15/18 2226   05/15/18 0000  anidulafungin (ERAXIS) 100 mg in sodium chloride 0.9 % 100 mL IVPB  Status:  Discontinued     100 mg 78 mL/hr over 100 Minutes Intravenous Every 24 hours 05/14/18 1728 05/15/18 1159   05/14/18 1800  anidulafungin (ERAXIS) 200 mg in sodium chloride 0.9 % 200 mL IVPB     200 mg 78 mL/hr over 200 Minutes Intravenous  Once 05/14/18 1751 05/14/18 2138   05/14/18 1745  vancomycin (VANCOCIN) 2,000 mg in sodium chloride 0.9 % 500 mL IVPB  Status:  Discontinued     2,000 mg 250 mL/hr over 120 Minutes Intravenous  Once 05/14/18 1741 05/15/18 1159   05/14/18 1741  vancomycin variable dose per unstable renal function (pharmacist dosing)  Status:  Discontinued      Does not apply See admin instructions 05/14/18 1741 05/15/18 1159   05/14/18 1515  ceFEPIme  (MAXIPIME) 2 g in sodium chloride 0.9 % 100 mL IVPB     2 g 200 mL/hr over 30 Minutes Intravenous  Once 05/14/18 1512 05/14/18 1649   05/14/18 1515  metroNIDAZOLE (FLAGYL) IVPB 500 mg  Status:  Discontinued     500 mg 100 mL/hr over 60 Minutes Intravenous Every 8 hours 05/14/18 1512 05/19/18 1244      Procedures: 2/6>>PICC line 1/29>> exploratory laparotomy with small bowel anastomosis and closure of abdominal fascia 1/27>>Right IJ (Anesthesia) 1/27>>EXPLORATORY LAPAROTOMY,SMALL BOWEL RESECTION,CLOSURE WITH ABTHERA VAC  CONSULTS:  pulmonary/intensive care and general surgery  Time spent: 25- minutes-Greater than 50% of this time was spent in counseling, explanation of diagnosis, planning of further management, and coordination of care.  MEDICATIONS: Scheduled Meds: . acetylcysteine  4 mL Nebulization TID  . chlorhexidine  15 mL Mouth Rinse BID  . Chlorhexidine Gluconate Cloth  6 each Topical Daily  . enoxaparin (LOVENOX) injection  40 mg Subcutaneous Q24H  . fluticasone  2  spray Each Nare Daily  . guaiFENesin  600 mg Oral BID  . insulin aspart  0-20 Units Subcutaneous Q4H  . insulin glargine  24 Units Subcutaneous Daily  . ipratropium-albuterol  3 mL Nebulization Q6H WA  . mouth rinse  15 mL Mouth Rinse BID  . pantoprazole (PROTONIX) IV  40 mg Intravenous Q24H  . sodium chloride flush  10-40 mL Intracatheter Q12H  . venlafaxine XR  150 mg Oral Daily   Continuous Infusions: . dextrose 10 mL/hr at 05/25/18 1455  . lactated ringers 50 mL/hr at 05/28/18 0836  . meropenem (MERREM) IV 2 g (05/28/18 1013)  . potassium PHOSPHATE IVPB (in mmol)    . TPN ADULT (ION) 90 mL/hr at 05/27/18 1755  . TPN ADULT (ION)    . vancomycin 1,250 mg (05/28/18 0436)   PRN Meds:.acetaminophen, alum & mag hydroxide-simeth, fentaNYL (SUBLIMAZE) injection, ibuprofen, iopamidol, lip balm, sodium chloride flush, sodium chloride flush   PHYSICAL EXAM: Vital signs: Vitals:   05/28/18 0644 05/28/18  0654 05/28/18 0758 05/28/18 0801  BP:  137/76    Pulse:      Resp:      Temp: 98.3 F (36.8 C)     TempSrc: Oral     SpO2:   99% 95%  Weight:      Height:       Filed Weights   05/26/18 0500 05/27/18 0500 05/28/18 0539  Weight: 117.4 kg 117.6 kg 119.1 kg   Body mass index is 46.51 kg/m.   General appearance:Awake, alert, not in any distress.  Eyes:no scleral icterus. HEENT: Atraumatic and Normocephalic Neck: supple, no JVD. Resp:Good air entry bilaterally, bibasilar rales-some transmitted upper airway sounds CVS: S1 S2 regular, no murmurs.  GI: Bowel sounds present, Non tender and not distended with no gaurding, rigidity or rebound. Extremities: B/L Lower Ext shows trace edema, both legs are warm to touch Neurology:  Non focal Musculoskeletal:No digital cyanosis Skin:No Rash, warm and dry Wounds:N/A  I have personally reviewed following labs and imaging studies  LABORATORY DATA: CBC: Recent Labs  Lab 05/24/18 0515 05/25/18 0352 05/26/18 0320 05/27/18 0354 05/28/18 0449  WBC 21.0* 17.1* 13.5* 10.4 8.1  NEUTROABS  --   --   --   --  6.2  HGB 9.5* 8.6* 8.3* 8.1* 8.4*  HCT 30.0* 26.6* 26.4* 26.6* 27.8*  MCV 84.0 83.6 85.2 85.0 85.0  PLT 502* 399 381 325 626    Basic Metabolic Panel: Recent Labs  Lab 05/22/18 0500 05/23/18 0332 05/24/18 0515 05/26/18 0320 05/27/18 0354 05/28/18 0449  NA 153* 149* 141 141 140 139  K 3.6 4.0 3.8 3.5 3.2* 3.6  CL 118* 114* 105 105 103 109  CO2 29 30 23 25 25 24   GLUCOSE 238* 213* 217* 125* 132* 163*  BUN 14 15 18 15 15 15   CREATININE 0.77 0.72 0.77 0.62 0.74 0.71  CALCIUM 8.3* 8.5* 8.7* 8.3* 8.0* 7.6*  MG 2.0  --  1.9  --   --  2.0  PHOS 2.9  --  2.7  --   --  1.9*    GFR: Estimated Creatinine Clearance: 95.7 mL/min (by C-G formula based on SCr of 0.71 mg/dL).  Liver Function Tests: Recent Labs  Lab 05/21/18 1453 05/24/18 0515 05/28/18 0449  AST 20 35 36  ALT 18 22 28   ALKPHOS 58 63 78  BILITOT 0.3 0.3 0.2*    PROT 5.7* 6.0* 5.7*  ALBUMIN 2.0* 2.1* 2.0*   No results for input(s): LIPASE,  AMYLASE in the last 168 hours. No results for input(s): AMMONIA in the last 168 hours.  Coagulation Profile: No results for input(s): INR, PROTIME in the last 168 hours.  Cardiac Enzymes: No results for input(s): CKTOTAL, CKMB, CKMBINDEX, TROPONINI in the last 168 hours.  BNP (last 3 results) No results for input(s): PROBNP in the last 8760 hours.  HbA1C: No results for input(s): HGBA1C in the last 72 hours.  CBG: Recent Labs  Lab 05/27/18 2104 05/28/18 0003 05/28/18 0124 05/28/18 0420 05/28/18 0735  GLUCAP 157* 139* 144* 145* 154*    Lipid Profile: Recent Labs    05/28/18 0449  TRIG 139    Thyroid Function Tests: No results for input(s): TSH, T4TOTAL, FREET4, T3FREE, THYROIDAB in the last 72 hours.  Anemia Panel: No results for input(s): VITAMINB12, FOLATE, FERRITIN, TIBC, IRON, RETICCTPCT in the last 72 hours.  Urine analysis:    Component Value Date/Time   COLORURINE YELLOW 05/14/2018 2014   APPEARANCEUR CLOUDY (A) 05/14/2018 2014   LABSPEC 1.023 05/14/2018 2014   PHURINE 5.0 05/14/2018 2014   GLUCOSEU >=500 (A) 05/14/2018 2014   HGBUR MODERATE (A) 05/14/2018 2014   BILIRUBINUR NEGATIVE 05/14/2018 2014   KETONESUR 20 (A) 05/14/2018 2014   PROTEINUR 100 (A) 05/14/2018 2014   NITRITE NEGATIVE 05/14/2018 2014   LEUKOCYTESUR TRACE (A) 05/14/2018 2014    Sepsis Labs: Lactic Acid, Venous    Component Value Date/Time   LATICACIDVEN 1.5 05/27/2018 1701    MICROBIOLOGY: Recent Results (from the past 240 hour(s))  Culture, blood (routine x 2)     Status: None (Preliminary result)   Collection Time: 05/24/18  7:20 PM  Result Value Ref Range Status   Specimen Description BLOOD LEFT ANTECUBITAL  Final   Special Requests   Final    BOTTLES DRAWN AEROBIC AND ANAEROBIC Blood Culture adequate volume   Culture   Final    NO GROWTH 3 DAYS Performed at [facility],  [place] [address], [place], [place] [address]    Report Status PENDING  Incomplete  Culture, blood (routine x 2)     Status: None (Preliminary result)   Collection Time: 05/24/18 10:28 PM  Result Value Ref Range Status   Specimen Description BLOOD LEFT THUMB  Final   Special Requests   Final    BOTTLES DRAWN AEROBIC ONLY Blood Culture adequate volume   Culture   Final    NO GROWTH 2 DAYS Performed at [facility], [address] [place], [place] [address]    Report Status PENDING  Incomplete    RADIOLOGY STUDIES/RESULTS: Ct Head Wo Contrast  Result Date: 05/18/2018 CLINICAL DATA:  Altered level of consciousness. Patient following commands intermittently. EXAM: CT HEAD WITHOUT CONTRAST TECHNIQUE: Contiguous axial images were obtained from the base of the skull through the vertex without intravenous contrast. COMPARISON:  None. FINDINGS: Brain: No evidence of acute infarction, hemorrhage, hydrocephalus, extra-axial collection or mass lesion/mass effect. Vascular: No hyperdense vessel or unexpected calcification. Skull: Normal. Negative for fracture or focal lesion. Sinuses/Orbits: Globes and orbits are unremarkable. There is mild mucosal thickening lining the ethmoid air cells and left sphenoid sinus with minimal right sphenoid sinus and maxillary sinus mucosal thickening. Other: None. IMPRESSION: 1. No intracranial abnormalities. 2. Sinus mucosal thickening. Electronically Signed   By: Lajean Manes M.D.   On: 05/18/2018 14:29   Ct Chest W Contrast  Result Date: 05/28/2018 CLINICAL DATA:  Chest congestion and nonproductive cough. EXAM: CT CHEST WITH CONTRAST TECHNIQUE: Multidetector  CT imaging of the chest was performed during intravenous contrast administration. CONTRAST:  71mL OMNIPAQUE IOHEXOL 300 MG/ML  SOLN COMPARISON:  Chest radiographs, 05/28/2018 and prior exams. FINDINGS: Cardiovascular: Heart is normal in size. No pericardial effusion. Great vessels are normal in caliber. No aortic  atherosclerosis. Mediastinum/Nodes: Mild prominence of the right thyroid lobe with areas of decreased attenuation. Suspect multiple nodules, not well-defined by CT. No neck base or axillary masses or enlarged lymph nodes. No mediastinal or hilar masses or adenopathy. Trachea is unremarkable. Mild distention of the distal esophagus versus a small hiatal hernia. There is a focus of increased attenuation within this, likely some ingested dense material. No esophageal mass or wall thickening. Lungs/Pleura: Trace right pleural effusion. Opacity in the right lower lobe, most evident in the central lower lobe. This may reflect atelectasis or infection. There is a small area of consolidation right upper lobe centered on image 49, series 5. Small amount of dependent opacity is noted in the right upper lobe against the minor fissure, likely atelectasis. Additional areas hazy ground-glass type opacity in both lungs, mostly in the upper lobes and right middle lobe. This may be due to infection/inflammation or air trapping from airways disease. Low lung volumes accentuates these findings. No lung mass or nodule. No evidence of pulmonary edema. No pneumothorax. Upper Abdomen: No acute abnormality. Musculoskeletal: No chest wall abnormality. No acute or significant osseous findings. IMPRESSION: 1. Consolidation in the right lower lobe, which may reflect atelectasis or pneumonia. Suspect pneumonia at least as a component. There is a small area of consolidation in the right upper lobe, consistent with an additional focus of pneumonia. Both lungs show areas of ground-glass opacity which may reflect additional infection/inflammation or be due to airways disease and air trapping. 2. No evidence of pulmonary edema. 3. Minimal right pleural effusion. Electronically Signed   By: Lajean Manes M.D.   On: 05/28/2018 11:25   Ct Abdomen Pelvis W Contrast  Result Date: 05/24/2018 CLINICAL DATA:  Postop course prolonged and complicated by  ileus-on TNA-due to worsening leukocytosis. Patient underwent exploratory laparotomy on 05/16/2018 small bowel resection. Concern for ileus versus small bowel obstruction or abscess. Leukocytosis. Question of anastomotic leak. EXAM: CT ABDOMEN AND PELVIS WITH CONTRAST TECHNIQUE: Multidetector CT imaging of the abdomen and pelvis was performed using the standard protocol following bolus administration of intravenous contrast. CONTRAST:  117mL OMNIPAQUE IOHEXOL 300 MG/ML  SOLN COMPARISON:  CT of the abdomen and pelvis on 05/14/2018 FINDINGS: Lower chest: There is new consolidation at the RIGHT LOWER lobe. Subsegmental atelectasis is identified at the LEFT lung base. Hepatobiliary: Small cysts within the liver measures 1.1 centimeters. The gallbladder is present and contains faintly radiopaque calculus or calculi. Pancreas: Unremarkable. No pancreatic ductal dilatation or surrounding inflammatory changes. Spleen: Normal in size without focal abnormality. Adrenals/Urinary Tract: Adrenal glands are unremarkable. Kidneys are normal, without renal calculi, focal lesion, or hydronephrosis. Bladder is unremarkable. Stomach/Bowel: The stomach is normal in appearance. There has been interval resection of abnormal small bowel loop in the RIGHT LOWER QUADRANT. The anastomosis in the RIGHT LOWER QUADRANT appears unremarkable. There are persistent dilated loops of small bowel particularly in the central abdomen showing multiple caliber changes, suggestive of adhesions. Numerous air-fluid levels are identified in the central small bowel loops. Although there is fluid in the mesentery and RIGHT LOWER QUADRANT, there is no evidence for anastomotic leak of contrast. No free intraperitoneal air. There are scattered colonic diverticula but no evidence for acute diverticulitis. There is central  mesenteric edema. Colon is normal in caliber. Vascular/Lymphatic: No significant vascular findings are present. No enlarged abdominal or pelvic  lymph nodes. Reproductive: Uterus is enlarged and contains numerous low-attenuation lesions, suggestive of degenerating fibroids. No adnexal mass. Other: No free pelvic fluid. Mesenteric ascites. Edematous changes identified within the anterior abdominal wall. Postoperative changes in the anterior abdomen. Musculoskeletal: No acute or significant osseous findings. IMPRESSION: 1. Interval resection of abnormal small bowel loop in the RIGHT LOWER QUADRANT. 2. Persistent dilated loops of small bowel in the central abdomen, suggestive of adhesions, and consistent with small bowel obstruction. 3. No evidence for anastomotic leak. 4. Mesenteric edema. 5. New consolidation at the RIGHT lung base. 6. Cholelithiasis. 7. Enlarged uterus containing numerous low-attenuation lesions, suggestive of degenerating fibroids. Electronically Signed   By: Nolon Nations M.D.   On: 05/24/2018 16:06   Dg Chest Port 1 View  Result Date: 05/28/2018 CLINICAL DATA:  58 year old female with shortness breath. Diabetes. Subsequent encounter. EXAM: PORTABLE CHEST 1 VIEW COMPARISON:  05/27/2018 chest x-ray. 05/24/2018 CT abdomen and pelvis. FINDINGS: Persistent elevated right hemidiaphragm. Right mid to lower lobe consolidation may represent atelectasis or infiltrate and appears similar to prior exam. Central pulmonary vascular congestion. Mild cardiomegaly. No pneumothorax detected. Right PICC line tip mid superior vena cava level. No acute osseous abnormality. IMPRESSION: 1. Persistent elevated right hemidiaphragm. Right mid to lower lobe consolidation may represent atelectasis or infiltrate and appears similar to prior exam. 2. Central pulmonary vascular congestion. 3. Mild cardiomegaly. Electronically Signed   By: Genia Del M.D.   On: 05/28/2018 08:01   Dg Chest Port 1 View  Result Date: 05/27/2018 CLINICAL DATA:  Shortness of breath and cough EXAM: PORTABLE CHEST 1 VIEW COMPARISON:  Yesterday FINDINGS: Streaky opacities at the  bases with elevated right diaphragm. Cardiomegaly. No Kerley lines or pneumothorax. Unremarkable right upper extremity PICC position. IMPRESSION: No change in atelectatic opacities at the bases, more extensive on the right. Electronically Signed   By: Monte Fantasia M.D.   On: 05/27/2018 09:12   Dg Chest Port 1 View  Result Date: 05/17/2018 CLINICAL DATA:  Endotracheally intubated. EXAM: PORTABLE CHEST 1 VIEW COMPARISON:  05/15/2018 FINDINGS: Endotracheal tube terminates 2.5 cm above the carina. Enteric tube courses into the abdomen with tip not imaged and side hole below the diaphragm. Right jugular catheter terminates near the superior cavoatrial junction. The cardiomediastinal silhouette is unchanged with normal heart size. Mild bibasilar opacities have slightly improved. No sizable pleural effusion or pneumothorax is identified. IMPRESSION: Decreased, mild bibasilar atelectasis. Electronically Signed   By: Logan Bores M.D.   On: 05/17/2018 07:14   Dg Chest Port 1 View  Result Date: 05/15/2018 CLINICAL DATA:  Ventilator support EXAM: PORTABLE CHEST 1 VIEW COMPARISON:  05/14/2018 FINDINGS: Endotracheal tube tip is 2 cm above the carina. Nasogastric tube enters the stomach. Right internal jugular central line tip is in the proximal right atrium. No pneumothorax. Mild bilateral lower lobe atelectasis. IMPRESSION: Lines and tubes well positioned. Mild basilar atelectasis. Electronically Signed   By: Nelson Chimes M.D.   On: 05/15/2018 07:41   Dg Chest Portable 1 View  Result Date: 05/14/2018 CLINICAL DATA:  Abdominal pain for several days EXAM: PORTABLE CHEST 1 VIEW COMPARISON:  CT from earlier in the same day. FINDINGS: Elevation of the right hemidiaphragm is again seen. Cardiac shadow is within normal limits. Mild right basilar atelectatic changes are noted. No sizable effusion is seen. No bony abnormality is noted. IMPRESSION: Mild right basilar atelectasis. Electronically  Signed   By: Inez Catalina  M.D.   On: 05/14/2018 15:26   Dg Chest Port 1v Same Day  Result Date: 05/27/2018 CLINICAL DATA:  Shortness of breath, history diabetes mellitus, GERD, hypertension EXAM: PORTABLE CHEST 1 VIEW COMPARISON:  Portable exam 1624 hours compared to 05/27/2018 FINDINGS: Normal heart size, mediastinal contours, and pulmonary vascularity. RIGHT arm PICC line tip projects over SVC. Chronic elevation of RIGHT diaphragm with subsegmental atelectasis at RIGHT base. Central peribronchial thickening. No infiltrate, pleural effusion or pneumothorax. IMPRESSION: Bronchitic changes with persistent RIGHT basilar atelectasis. Electronically Signed   By: Lavonia Dana M.D.   On: 05/27/2018 16:59   Dg Chest Port 1v Same Day  Result Date: 05/26/2018 CLINICAL DATA:  58 year old female with shortness of breath. Recent abdominal surgery. EXAM: PORTABLE CHEST 1 VIEW COMPARISON:  Prior chest x-ray 05/17/2018 FINDINGS: The patient has been extubated and the nasogastric tube removed. Additionally, the right IJ central venous catheter has been removed. A right upper extremity PICC has been placed. The catheter tip overlies the distal SVC. Dense band of linear airspace opacity in the right lung obscuring the right cardiac margin favored to represent right middle lobar collapse/atelectasis. Additionally, there is linear atelectasis in the left lower lobe. No evidence of pulmonary edema or pneumothorax. No acute osseous abnormality. IMPRESSION: 1. Multifocal atelectasis including right middle lobe collapse. This may be secondary to mucous plugging. 2. Well-positioned right upper extremity approach PICC. Electronically Signed   By: Jacqulynn Cadet M.D.   On: 05/26/2018 10:19   Dg Abd Portable 1v  Result Date: 05/26/2018 CLINICAL DATA:  58 year old female with abdominal distension and shortness of breath EXAM: PORTABLE ABDOMEN - 1 VIEW COMPARISON:  CT abdomen/pelvis 05/24/2018 FINDINGS: Persistent diffuse gaseous distention of multiple loops  of small bowel throughout the abdomen. No massive free air visualized on this supine radiograph. Lower lumbar degenerative disc disease again noted. IMPRESSION: Persistent gaseous distention of multiple loops of small bowel throughout the abdomen most consistent with ileus. Electronically Signed   By: Jacqulynn Cadet M.D.   On: 05/26/2018 10:18   Dg Abd Portable 1v  Result Date: 05/24/2018 CLINICAL DATA:  Initial evaluation for acute vomiting, small bowel obstruction. EXAM: PORTABLE ABDOMEN - 1 VIEW COMPARISON:  Prior CT from 05/14/2018. FINDINGS: Multiple dilated gas-filled loops of small bowel seen within the left and mid abdomen, compatible with ongoing small bowel obstruction. Loops dilated up to approximately 4 cm in diameter. Paucity of gas distally. No soft tissue mass or abnormal calcification. No appreciable free air on these limited supine views of the abdomen. IMPRESSION: Multiple persistent gas-filled dilated loops of small bowel within the left and mid abdomen, compatible with ongoing small bowel obstruction. Electronically Signed   By: Jeannine Boga M.D.   On: 05/24/2018 06:00   Ct Renal Stone Study  Result Date: 05/14/2018 CLINICAL DATA:  58 y/o F; nausea, vomiting, and abdominal pain for 3 days. EXAM: CT ABDOMEN AND PELVIS WITHOUT CONTRAST TECHNIQUE: Multidetector CT imaging of the abdomen and pelvis was performed following the standard protocol without IV contrast. COMPARISON:  None. FINDINGS: Lower chest: Platelike atelectasis in the lung bases. Hepatobiliary: No focal liver abnormality is seen. No gallstones, gallbladder wall thickening, or biliary dilatation. Pancreas: Unremarkable. No pancreatic ductal dilatation or surrounding inflammatory changes. Spleen: Normal in size without focal abnormality. Adrenals/Urinary Tract: Adrenal glands are unremarkable. Kidneys are normal, without renal calculi, focal lesion, or hydronephrosis. Bladder is unremarkable. Stomach/Bowel: Severe  diffuse small-bowel obstruction with transition at the level  of the distal ileum in the right lower quadrant (series 3, image 65). Mild sigmoid diverticulosis without findings of acute diverticulitis. Otherwise normal appearance of the colon. Vascular/Lymphatic: Aortic atherosclerosis. No enlarged abdominal or pelvic lymph nodes. Reproductive: Negative. Other: There are a few tiny foci of pneumoperitoneum at the liver hilum, anterior abdominal wall, and below the diaphragm (series 3, image 12, 24, 27). Small volume of ascites. Musculoskeletal: No fracture is seen. Lumbar spondylosis with prominent facet arthropathy. IMPRESSION: 1. Severe diffuse small bowel obstruction with transition level of distal ileum in right lower quadrant. 2. Few punctate foci of pneumoperitoneum indicating small/micro perforation. Small volume of ascites. These results were called by telephone at the time of interpretation on 05/14/2018 at 3:27 pm to Dr. Andy Gauss, who verbally acknowledged these results. Electronically Signed   By: Kristine Garbe M.D.   On: 05/14/2018 15:34   [place] Ekg Site Rite  Result Date: 05/23/2018 If Site Rite image not attached, placement could not be confirmed due to current cardiac rhythm.    LOS: 14 days   Oren Binet, MD  [facility]  If 7PM-7AM, please contact night-coverage  Please page via [email or web address]-click on MD name and type text message  05/28/2018, 11:53 AM

## 2018-05-28 NOTE — Progress Notes (Signed)
[facility] CONSULT NOTE   Pharmacy Consult for TPN Indication: ileus  Patient Measurements: Height: 5\' 3"  (160 cm) Weight: 262 lb 9.1 oz (119.1 kg) IBW/kg (Calculated) : 52.4 TPN AdjBW (KG): 68.8 Body mass index is 46.51 kg/m. Usual Weight: 117.9 kg  Assessment:  58 y/o female presenting to the ED with 3 days of vomiting and abdominal pain found to be in DKA. CT showed SBO. Noted SBO, pneumoperitoneum, intussusception with necrosis and performation and was taken to the OR emergently for ex lap, SBR left in discontinuity, and abthera VAC on 1/27. Taken back to the OR on 1/29 for ex lap with SB anastomosis and fascia closure. High risk for refeeding.  Husband reports patient eating well prior to experiencing GI distress.  GI: SBO, pneumoperitoneum, intussusception with necrosis and perforation. Patient reports abdominal pain with ice chips. Albumin 2.1>2, prealbumin improving 10.1>14.4>17.8, NG removed 2/5. +flatus and  LBM 2/7.  PPI IV for GERD - CT scan 2/6 showing ileus vs SBO, anastomosis intact, no abscess  Endo: hx DM, CBGs ~150s Insulin requirements in the past 24 hours: 20 units SSI, 55 units in TPN, and 24 units of Lantus  Lytes: K 3.6 (goal >= 4), Mag 2 (goal >=2) Phos 1.9  Renal: AKI resolved, SCr stable Pulm: 2L [place] (Mucomyst nebs, Flonase, duonebs) Cards: h/o HLD, HTN Hepatobil: LFT and T bili WNL, TG 139 Neuro: Effexor ID: s/p meropenem for IAI ended 2/8, Afebrile, WBC 10.4 declining.  Now Unasyn started 2/8 for PNA. Spiked temp 102.9 last PM 2/8. 1/27: Clostridium [place] 1/27: Peritoneal fluid: Enterococcus faecium, Clostridium TPN Access: double lumen CVC placed 05/14/18 TPN start date: 05/19/18  Nutritional Goals (per RD recommendation on 05/19/18): KCal: 2100-2300 Protein: 116-128 g Fluid: >1.6L  Goal TPN rate is 90 ml/hr   Current Nutrition:  TPN NPO  Plan:  Continue TPN at 72mL/hr - This TPN provides 127 g of protein,  302 g of dextrose, and 58 g of lipids which provides 2,120 kCals per day, meeting 100% of patient needs Electrolytes in TPN: Increase K and Phos  Continue MVI, trace elements to TPN Increase insulin to 60 units in TPN  Continue resistant SSI Q4h and Lantus 24 units per MD  Monitor TPN labs Mon/Thurs and prn  KPhos 20 mmol x 1 Phos in am  Levester Fresh, PharmD, BCPS, BCCCP Clinical Pharmacist [phone]  Please check AMION for all [facility] numbers  05/28/2018 10:11 AM

## 2018-05-28 NOTE — Progress Notes (Signed)
Patient refused nebulizer tx and chest PT at this time.

## 2018-05-28 NOTE — Progress Notes (Signed)
Patient declined chest vest at this time. States she doesn't feel well at this time.

## 2018-05-28 NOTE — Progress Notes (Signed)
Patient ID: Wendy [name], female   DOB: 24-Jun-1960, 58 y.o.   MRN: [id number]    12 Days Post-Op  Subjective: Patient doesn't want to get up and mobilize.  She states she is, but refused PT on Friday and still has a purewick in place.  She c/o cough.  Had high fevers all day yesterday. No further nausea.  Multiple BMs  Objective: Vital signs in last 24 hours: Temp:  [98.3 F (36.8 C)-103.2 F (39.6 C)] 99.2 F (37.3 C) (02/10 1214) Pulse Rate:  [100-137] 100 (02/10 1214) Resp:  [19-27] 19 (02/09 1735) BP: (137-187)/(76-90) 137/76 (02/10 0654) SpO2:  [94 %-100 %] 98 % (02/10 1214) FiO2 (%):  [21 %] 21 % (02/10 0801) Weight:  [119.1 kg] 119.1 kg (02/10 0539) Last BM Date: 05/25/18  Intake/Output from previous day: 02/09 0701 - 02/10 0700 In: -  Out: 3200 [Urine:3200] Intake/Output this shift: No intake/output data recorded.  PE: Heart: tachy Lungs: course, rhonchus BS on right Abd: soft, obese, midline wound with VAC in place, some BS  Lab Results:  Recent Labs    05/27/18 0354 05/28/18 0449  WBC 10.4 8.1  HGB 8.1* 8.4*  HCT 26.6* 27.8*  PLT 325 281   BMET Recent Labs    05/27/18 0354 05/28/18 0449  NA 140 139  K 3.2* 3.6  CL 103 109  CO2 25 24  GLUCOSE 132* 163*  BUN 15 15  CREATININE 0.74 0.71  CALCIUM 8.0* 7.6*   PT/INR No results for input(s): LABPROT, INR in the last 72 hours. CMP     Component Value Date/Time   NA 139 05/28/2018 0449   NA 144 12/25/2014 1536   K 3.6 05/28/2018 0449   CL 109 05/28/2018 0449   CO2 24 05/28/2018 0449   GLUCOSE 163 (H) 05/28/2018 0449   BUN 15 05/28/2018 0449   BUN 9 12/25/2014 1536   CREATININE 0.71 05/28/2018 0449   CREATININE 0.69 05/25/2017 1150   CALCIUM 7.6 (L) 05/28/2018 0449   PROT 5.7 (L) 05/28/2018 0449   PROT 6.7 12/25/2014 1536   ALBUMIN 2.0 (L) 05/28/2018 0449   ALBUMIN 4.4 12/25/2014 1536   AST 36 05/28/2018 0449   ALT 28 05/28/2018 0449   ALKPHOS 78 05/28/2018 0449   BILITOT 0.2 (L)  05/28/2018 0449   BILITOT 0.3 12/25/2014 1536   GFRNONAA >60 05/28/2018 0449   GFRNONAA 78 08/25/2016 1100   GFRAA >60 05/28/2018 0449   GFRAA >89 08/25/2016 1100   Lipase     Component Value Date/Time   LIPASE 37 05/14/2018 2014       Studies/Results: Ct Chest W Contrast  Result Date: 05/28/2018 CLINICAL DATA:  Chest congestion and nonproductive cough. EXAM: CT CHEST WITH CONTRAST TECHNIQUE: Multidetector CT imaging of the chest was performed during intravenous contrast administration. CONTRAST:  50mL OMNIPAQUE IOHEXOL 300 MG/ML  SOLN COMPARISON:  Chest radiographs, 05/28/2018 and prior exams. FINDINGS: Cardiovascular: Heart is normal in size. No pericardial effusion. Great vessels are normal in caliber. No aortic atherosclerosis. Mediastinum/Nodes: Mild prominence of the right thyroid lobe with areas of decreased attenuation. Suspect multiple nodules, not well-defined by CT. No neck base or axillary masses or enlarged lymph nodes. No mediastinal or hilar masses or adenopathy. Trachea is unremarkable. Mild distention of the distal esophagus versus a small hiatal hernia. There is a focus of increased attenuation within this, likely some ingested dense material. No esophageal mass or wall thickening. Lungs/Pleura: Trace right pleural effusion. Opacity in the right lower lobe,  most evident in the central lower lobe. This may reflect atelectasis or infection. There is a small area of consolidation right upper lobe centered on image 49, series 5. Small amount of dependent opacity is noted in the right upper lobe against the minor fissure, likely atelectasis. Additional areas hazy ground-glass type opacity in both lungs, mostly in the upper lobes and right middle lobe. This may be due to infection/inflammation or air trapping from airways disease. Low lung volumes accentuates these findings. No lung mass or nodule. No evidence of pulmonary edema. No pneumothorax. Upper Abdomen: No acute abnormality.  Musculoskeletal: No chest wall abnormality. No acute or significant osseous findings. IMPRESSION: 1. Consolidation in the right lower lobe, which may reflect atelectasis or pneumonia. Suspect pneumonia at least as a component. There is a small area of consolidation in the right upper lobe, consistent with an additional focus of pneumonia. Both lungs show areas of ground-glass opacity which may reflect additional infection/inflammation or be due to airways disease and air trapping. 2. No evidence of pulmonary edema. 3. Minimal right pleural effusion. Electronically Signed   By: Lajean Manes M.D.   On: 05/28/2018 11:25   Dg Chest Port 1 View  Result Date: 05/28/2018 CLINICAL DATA:  58 year old female with shortness breath. Diabetes. Subsequent encounter. EXAM: PORTABLE CHEST 1 VIEW COMPARISON:  05/27/2018 chest x-ray. 05/24/2018 CT abdomen and pelvis. FINDINGS: Persistent elevated right hemidiaphragm. Right mid to lower lobe consolidation may represent atelectasis or infiltrate and appears similar to prior exam. Central pulmonary vascular congestion. Mild cardiomegaly. No pneumothorax detected. Right PICC line tip mid superior vena cava level. No acute osseous abnormality. IMPRESSION: 1. Persistent elevated right hemidiaphragm. Right mid to lower lobe consolidation may represent atelectasis or infiltrate and appears similar to prior exam. 2. Central pulmonary vascular congestion. 3. Mild cardiomegaly. Electronically Signed   By: Genia Del M.D.   On: 05/28/2018 08:01   Dg Chest Port 1 View  Result Date: 05/27/2018 CLINICAL DATA:  Shortness of breath and cough EXAM: PORTABLE CHEST 1 VIEW COMPARISON:  Yesterday FINDINGS: Streaky opacities at the bases with elevated right diaphragm. Cardiomegaly. No Kerley lines or pneumothorax. Unremarkable right upper extremity PICC position. IMPRESSION: No change in atelectatic opacities at the bases, more extensive on the right. Electronically Signed   By: Monte Fantasia  M.D.   On: 05/27/2018 09:12   Dg Chest Port 1v Same Day  Result Date: 05/27/2018 CLINICAL DATA:  Shortness of breath, history diabetes mellitus, GERD, hypertension EXAM: PORTABLE CHEST 1 VIEW COMPARISON:  Portable exam 1624 hours compared to 05/27/2018 FINDINGS: Normal heart size, mediastinal contours, and pulmonary vascularity. RIGHT arm PICC line tip projects over SVC. Chronic elevation of RIGHT diaphragm with subsegmental atelectasis at RIGHT base. Central peribronchial thickening. No infiltrate, pleural effusion or pneumothorax. IMPRESSION: Bronchitic changes with persistent RIGHT basilar atelectasis. Electronically Signed   By: Lavonia Dana M.D.   On: 05/27/2018 16:59    Anti-infectives: Anti-infectives (From admission, onward)   Start     Dose/Rate Route Frequency Ordered Stop   05/28/18 0500  vancomycin (VANCOCIN) 1,250 mg in sodium chloride 0.9 % 250 mL IVPB     1,250 mg 166.7 mL/hr over 90 Minutes Intravenous Every 12 hours 05/27/18 1612     05/27/18 1700  vancomycin (VANCOCIN) 2,500 mg in sodium chloride 0.9 % 500 mL IVPB     2,500 mg 250 mL/hr over 120 Minutes Intravenous  Once 05/27/18 1612 05/27/18 1922   05/27/18 1600  meropenem (MERREM) 2 g in sodium  chloride 0.9 % 100 mL IVPB     2 g 200 mL/hr over 30 Minutes Intravenous Every 8 hours 05/27/18 1544 06/04/18 0759   05/26/18 1300  Ampicillin-Sulbactam (UNASYN) 3 g in sodium chloride 0.9 % 100 mL IVPB  Status:  Discontinued     3 g 200 mL/hr over 30 Minutes Intravenous Every 6 hours 05/26/18 1234 05/27/18 1538   05/19/18 1400  vancomycin (VANCOCIN) 1,250 mg in sodium chloride 0.9 % 250 mL IVPB  Status:  Discontinued     1,250 mg 166.7 mL/hr over 90 Minutes Intravenous Every 24 hours 05/18/18 1343 05/19/18 1244   05/19/18 1400  meropenem (MERREM) 2 g in sodium chloride 0.9 % 100 mL IVPB     2 g 200 mL/hr over 30 Minutes Intravenous Every 8 hours 05/19/18 1244 05/26/18 2359   05/18/18 1600  cefTRIAXone (ROCEPHIN) 2 g in sodium  chloride 0.9 % 100 mL IVPB  Status:  Discontinued     2 g 200 mL/hr over 30 Minutes Intravenous Every 24 hours 05/18/18 1326 05/19/18 1244   05/18/18 1400  vancomycin (VANCOCIN) 2,000 mg in sodium chloride 0.9 % 500 mL IVPB     2,000 mg 250 mL/hr over 120 Minutes Intravenous  Once 05/18/18 1343 05/18/18 1640   05/15/18 2333  ceFEPIme (MAXIPIME) 2 g in sodium chloride 0.9 % 100 mL IVPB  Status:  Discontinued     2 g 200 mL/hr over 30 Minutes Intravenous Every 8 hours 05/15/18 2226 05/18/18 1326   05/15/18 1500  ceFEPIme (MAXIPIME) 1 g in sodium chloride 0.9 % 100 mL IVPB  Status:  Discontinued     1 g 200 mL/hr over 30 Minutes Intravenous Every 24 hours 05/14/18 1512 05/15/18 2226   05/15/18 0000  anidulafungin (ERAXIS) 100 mg in sodium chloride 0.9 % 100 mL IVPB  Status:  Discontinued     100 mg 78 mL/hr over 100 Minutes Intravenous Every 24 hours 05/14/18 1728 05/15/18 1159   05/14/18 1800  anidulafungin (ERAXIS) 200 mg in sodium chloride 0.9 % 200 mL IVPB     200 mg 78 mL/hr over 200 Minutes Intravenous  Once 05/14/18 1751 05/14/18 2138   05/14/18 1745  vancomycin (VANCOCIN) 2,000 mg in sodium chloride 0.9 % 500 mL IVPB  Status:  Discontinued     2,000 mg 250 mL/hr over 120 Minutes Intravenous  Once 05/14/18 1741 05/15/18 1159   05/14/18 1741  vancomycin variable dose per unstable renal function (pharmacist dosing)  Status:  Discontinued      Does not apply See admin instructions 05/14/18 1741 05/15/18 1159   05/14/18 1515  ceFEPIme (MAXIPIME) 2 g in sodium chloride 0.9 % 100 mL IVPB     2 g 200 mL/hr over 30 Minutes Intravenous  Once 05/14/18 1512 05/14/18 1649   05/14/18 1515  metroNIDAZOLE (FLAGYL) IVPB 500 mg  Status:  Discontinued     500 mg 100 mL/hr over 60 Minutes Intravenous Every 8 hours 05/14/18 1512 05/19/18 1244       Assessment/Plan DKA- per medicine, resolved Sepsis- secondary to below intra-abdominal problem Right-side PNA - only pulls 750 at best on IS, per  medicine  SBO,Pneumoperitoneum, intussusception with necrosis & perforation - POD 12, S/P ex lap, small bowel resection, closure with abthera vac, Dr. Grandville Silos, 01/17 -S/PExploratorylaparotomy with small bowel anastomosis and closure of abdominal fascia, Dr. Brantley Stage, 01/29 - NGTcame out 02/05 -CT scan2/6showing ileus vs SBO, anastomosis intact, no abscess -ileus- improving, will adv to clear liquids - continue  TPN, but will plan to wean if tolerates clears today - IS -PT for mobilization.  Patient MUST get out of bed in order to help herself recover.  purewick needs to be removed and never replaced at this point.  FEN:CLD, TPN VTE: SCD's, startedlovenox 01/30 ZZ:CKIC/HTVGVS for HCAP Foley:DC02/05 Follow up:Dr. Grandville Silos    LOS: 14 days    Henreitta Cea , [facility] 05/28/2018, 12:21 PM Pager: [phone]

## 2018-05-29 LAB — GLUCOSE, CAPILLARY
GLUCOSE-CAPILLARY: 145 mg/dL — AB (ref 70–99)
Glucose-Capillary: 116 mg/dL — ABNORMAL HIGH (ref 70–99)
Glucose-Capillary: 129 mg/dL — ABNORMAL HIGH (ref 70–99)
Glucose-Capillary: 135 mg/dL — ABNORMAL HIGH (ref 70–99)
Glucose-Capillary: 140 mg/dL — ABNORMAL HIGH (ref 70–99)
Glucose-Capillary: 148 mg/dL — ABNORMAL HIGH (ref 70–99)

## 2018-05-29 LAB — BASIC METABOLIC PANEL
Anion gap: 8 (ref 5–15)
BUN: 15 mg/dL (ref 6–20)
CALCIUM: 7.8 mg/dL — AB (ref 8.9–10.3)
CO2: 23 mmol/L (ref 22–32)
Chloride: 108 mmol/L (ref 98–111)
Creatinine, Ser: 0.75 mg/dL (ref 0.44–1.00)
Glucose, Bld: 156 mg/dL — ABNORMAL HIGH (ref 70–99)
POTASSIUM: 3.6 mmol/L (ref 3.5–5.1)
Sodium: 139 mmol/L (ref 135–145)

## 2018-05-29 LAB — CULTURE, BLOOD (ROUTINE X 2)
CULTURE: NO GROWTH
Special Requests: ADEQUATE

## 2018-05-29 LAB — CBC
HCT: 26.5 % — ABNORMAL LOW (ref 36.0–46.0)
Hemoglobin: 8.1 g/dL — ABNORMAL LOW (ref 12.0–15.0)
MCH: 25.8 pg — ABNORMAL LOW (ref 26.0–34.0)
MCHC: 30.6 g/dL (ref 30.0–36.0)
MCV: 84.4 fL (ref 80.0–100.0)
Platelets: 284 10*3/uL (ref 150–400)
RBC: 3.14 MIL/uL — ABNORMAL LOW (ref 3.87–5.11)
RDW: 17.2 % — ABNORMAL HIGH (ref 11.5–15.5)
WBC: 6.3 10*3/uL (ref 4.0–10.5)
nRBC: 0 % (ref 0.0–0.2)

## 2018-05-29 LAB — PHOSPHORUS: PHOSPHORUS: 2.9 mg/dL (ref 2.5–4.6)

## 2018-05-29 MED ORDER — TRAVASOL 10 % IV SOLN
INTRAVENOUS | Status: AC
  Administered 2018-05-29: 18:00:00 via INTRAVENOUS
  Filled 2018-05-29: qty 1274.4

## 2018-05-29 MED ORDER — IPRATROPIUM-ALBUTEROL 0.5-2.5 (3) MG/3ML IN SOLN
3.0000 mL | Freq: Three times a day (TID) | RESPIRATORY_TRACT | Status: DC
  Administered 2018-05-29 – 2018-06-01 (×11): 3 mL via RESPIRATORY_TRACT
  Filled 2018-05-29 (×13): qty 3

## 2018-05-29 MED ORDER — POTASSIUM CHLORIDE 10 MEQ/100ML IV SOLN
10.0000 meq | INTRAVENOUS | Status: AC
  Administered 2018-05-29 (×2): 10 meq via INTRAVENOUS
  Filled 2018-05-29 (×2): qty 100

## 2018-05-29 NOTE — Progress Notes (Signed)
PROGRESS NOTE        PATIENT DETAILS Name: Wendy [name] Age: 58 y.o. Sex: female Date of Birth: 1961/01/30 Admit Date: 05/14/2018 Admitting Physician Rush Farmer, MD VFI:[id number], Drue Stager, MD  Brief Narrative: Patient is a 58 y.o. female with history of DM-2, pretension, GERD, morbid obesity who presented with a 3-day history of vomiting and abdominal pain-found to have sepsis secondary to small bowel obstruction and small perforation, along with DKA.  She was started on IV insulin, empiric antimicrobial therapy-General surgery was consulted-and patient was admitted to the ICU under the PCCM service.  Hospital course lately has been complicated by development of ileus, and pneumonia.  Significant Events: 1/27 Admit > OR for ex lap with small bowel resection> ICU 1/29 OR for anastomosis and closure 2/1 Extubated 2/2 Hypoactive delirium, worsening respiratory status 2/8>>Right PNA  Subjective: Fever curve better-continues diffuse pulmonary toileting with vibratory vest etc.  Per nursing staff-not very cooperative and attempts were made to ambulate her.   Assessment/Plan: Acute hypoxic respiratory failure: Extubated on 2/1 (intubated for surgery) continue to wean to RA as able.  Septic shock secondary to peritonitis with bowel obstruction along with Clostridium bacteremia: Sepsis pathophysiology has resolved, blood cultures positive for Clostridium species.  Peritoneal fluid cultures positive for enterococcus and Clostridium as well.  Has completed a course of meropenem till 2/8.   Small bowel obstruction with perforation s/p resection and closure of the abdominal fascia with prolonged postop course due to ileus: Continues to have ileus-tolerating clear liquids.  General surgery following and directing care.  Remains on TNA-diet advanced further, we can contemplate decreasing TNA support.  Right-sided pneumonia: Secondary to poor respiratory effort/cough effort-and  refusal to be mobilized/ambulated. Unfortunately continues to refuse chest physiotherapy vibrator vest etc.-not very cooperative with incentive spirometry as well.  Difficult situation-suspect that she will continue to have fever and pneumonia as long as she is uncooperative to be mobilized and pulmonary toileting.  I have discussed at length with patient over the past several days.  Remains on empiric vancomycin and meropenem, repeat blood cultures negative so far.  Anemia: Secondary to acute/critical illness-no evidence of blood loss.  Bilirubin low but stable-continue to follow periodically.  Hypernatremia: Resolved  Type II DM with uncontrolled hyperglycemia: Had severe hyperglycemia with probable DKA on admission-CBGs continue to be stable with Lantus 24 units daily and SSI.  Follow and optimize.    Hypertension: Blood pressure relatively well controlled-continue to monitor off antihypertensives-once oral intake has resumed we can resume her blood pressure medications.    GERD: Continue PPI  Dyslipidemia: Resume statin when oral intake is further stabilized  Debility/deconditioning: Secondary to acute illness/critical illness-CIR when more stable over the next few days.  DVT Prophylaxis: Prophylactic Lovenox   Code Status: Full code   Family Communication: None at bedside  Disposition Plan: Remain inpatient-suspect CIR on discharge  Antimicrobial agents: Anti-infectives (From admission, onward)   Start     Dose/Rate Route Frequency Ordered Stop   05/28/18 0500  vancomycin (VANCOCIN) 1,250 mg in sodium chloride 0.9 % 250 mL IVPB     1,250 mg 166.7 mL/hr over 90 Minutes Intravenous Every 12 hours 05/27/18 1612     05/27/18 1700  vancomycin (VANCOCIN) 2,500 mg in sodium chloride 0.9 % 500 mL IVPB     2,500 mg 250 mL/hr over 120 Minutes Intravenous  Once  05/27/18 1612 05/27/18 1922   05/27/18 1600  meropenem (MERREM) 2 g in sodium chloride 0.9 % 100 mL IVPB     2 g 200 mL/hr  over 30 Minutes Intravenous Every 8 hours 05/27/18 1544 06/04/18 0759   05/26/18 1300  Ampicillin-Sulbactam (UNASYN) 3 g in sodium chloride 0.9 % 100 mL IVPB  Status:  Discontinued     3 g 200 mL/hr over 30 Minutes Intravenous Every 6 hours 05/26/18 1234 05/27/18 1538   05/19/18 1400  vancomycin (VANCOCIN) 1,250 mg in sodium chloride 0.9 % 250 mL IVPB  Status:  Discontinued     1,250 mg 166.7 mL/hr over 90 Minutes Intravenous Every 24 hours 05/18/18 1343 05/19/18 1244   05/19/18 1400  meropenem (MERREM) 2 g in sodium chloride 0.9 % 100 mL IVPB     2 g 200 mL/hr over 30 Minutes Intravenous Every 8 hours 05/19/18 1244 05/26/18 2359   05/18/18 1600  cefTRIAXone (ROCEPHIN) 2 g in sodium chloride 0.9 % 100 mL IVPB  Status:  Discontinued     2 g 200 mL/hr over 30 Minutes Intravenous Every 24 hours 05/18/18 1326 05/19/18 1244   05/18/18 1400  vancomycin (VANCOCIN) 2,000 mg in sodium chloride 0.9 % 500 mL IVPB     2,000 mg 250 mL/hr over 120 Minutes Intravenous  Once 05/18/18 1343 05/18/18 1640   05/15/18 2333  ceFEPIme (MAXIPIME) 2 g in sodium chloride 0.9 % 100 mL IVPB  Status:  Discontinued     2 g 200 mL/hr over 30 Minutes Intravenous Every 8 hours 05/15/18 2226 05/18/18 1326   05/15/18 1500  ceFEPIme (MAXIPIME) 1 g in sodium chloride 0.9 % 100 mL IVPB  Status:  Discontinued     1 g 200 mL/hr over 30 Minutes Intravenous Every 24 hours 05/14/18 1512 05/15/18 2226   05/15/18 0000  anidulafungin (ERAXIS) 100 mg in sodium chloride 0.9 % 100 mL IVPB  Status:  Discontinued     100 mg 78 mL/hr over 100 Minutes Intravenous Every 24 hours 05/14/18 1728 05/15/18 1159   05/14/18 1800  anidulafungin (ERAXIS) 200 mg in sodium chloride 0.9 % 200 mL IVPB     200 mg 78 mL/hr over 200 Minutes Intravenous  Once 05/14/18 1751 05/14/18 2138   05/14/18 1745  vancomycin (VANCOCIN) 2,000 mg in sodium chloride 0.9 % 500 mL IVPB  Status:  Discontinued     2,000 mg 250 mL/hr over 120 Minutes Intravenous  Once  05/14/18 1741 05/15/18 1159   05/14/18 1741  vancomycin variable dose per unstable renal function (pharmacist dosing)  Status:  Discontinued      Does not apply See admin instructions 05/14/18 1741 05/15/18 1159   05/14/18 1515  ceFEPIme (MAXIPIME) 2 g in sodium chloride 0.9 % 100 mL IVPB     2 g 200 mL/hr over 30 Minutes Intravenous  Once 05/14/18 1512 05/14/18 1649   05/14/18 1515  metroNIDAZOLE (FLAGYL) IVPB 500 mg  Status:  Discontinued     500 mg 100 mL/hr over 60 Minutes Intravenous Every 8 hours 05/14/18 1512 05/19/18 1244      Procedures: 2/6>>PICC line 1/29>> exploratory laparotomy with small bowel anastomosis and closure of abdominal fascia 1/27>>Right IJ (Anesthesia) 1/27>>EXPLORATORY LAPAROTOMY,SMALL BOWEL RESECTION,CLOSURE WITH ABTHERA VAC  CONSULTS:  pulmonary/intensive care and general surgery  Time spent: 25- minutes-Greater than 50% of this time was spent in counseling, explanation of diagnosis, planning of further management, and coordination of care.  MEDICATIONS: Scheduled Meds: . chlorhexidine  15 mL  Mouth Rinse BID  . Chlorhexidine Gluconate Cloth  6 each Topical Daily  . enoxaparin (LOVENOX) injection  40 mg Subcutaneous Q24H  . fluticasone  2 spray Each Nare Daily  . guaiFENesin  600 mg Oral BID  . insulin aspart  0-20 Units Subcutaneous Q4H  . insulin glargine  24 Units Subcutaneous Daily  . ipratropium-albuterol  3 mL Nebulization TID  . mouth rinse  15 mL Mouth Rinse BID  . pantoprazole (PROTONIX) IV  40 mg Intravenous Q24H  . sodium chloride flush  10-40 mL Intracatheter Q12H  . venlafaxine XR  150 mg Oral Daily   Continuous Infusions: . dextrose 10 mL/hr at 05/25/18 1455  . lactated ringers 50 mL/hr at 05/29/18 0700  . meropenem (MERREM) IV 2 g (05/29/18 0940)  . TPN ADULT (ION) 90 mL/hr at 05/29/18 0700  . TPN ADULT (ION)    . vancomycin 1,250 mg (05/29/18 0400)   PRN Meds:.acetaminophen, alum & mag hydroxide-simeth, fentaNYL (SUBLIMAZE)  injection, ibuprofen, iopamidol, lip balm, sodium chloride flush, sodium chloride flush   PHYSICAL EXAM: Vital signs: Vitals:   05/29/18 0300 05/29/18 0750 05/29/18 0838 05/29/18 1155  BP: 119/74   136/83  Pulse: 98     Resp: (!) 22     Temp:   100 F (37.8 C) (!) 101.6 F (38.7 C)  TempSrc:   Oral Oral  SpO2: 96% 97%    Weight:    120 kg  Height:       Filed Weights   05/27/18 0500 05/28/18 0539 05/29/18 1155  Weight: 117.6 kg 119.1 kg 120 kg   Body mass index is 46.86 kg/m.   General appearance:Awake, alert, not in any distress.  Eyes:no scleral icterus. HEENT: Atraumatic and Normocephalic Neck: supple, no JVD. Resp:Good air entry bilaterally,no rales or rhonchi CVS: S1 S2 regular, no murmurs.  GI: Bowel sounds present but slightly sluggish.  Appropriately tender without any peritoneal signs.  Slightly distended.   Extremities: B/L Lower Ext shows no edema, both legs are warm to touch Neurology:  Non focal Musculoskeletal:No digital cyanosis Skin:No Rash, warm and dry Wounds:N/A  I have personally reviewed following labs and imaging studies  LABORATORY DATA: CBC: Recent Labs  Lab 05/25/18 0352 05/26/18 0320 05/27/18 0354 05/28/18 0449 05/29/18 1207  WBC 17.1* 13.5* 10.4 8.1 6.3  NEUTROABS  --   --   --  6.2  --   HGB 8.6* 8.3* 8.1* 8.4* 8.1*  HCT 26.6* 26.4* 26.6* 27.8* 26.5*  MCV 83.6 85.2 85.0 85.0 84.4  PLT 399 381 325 281 431    Basic Metabolic Panel: Recent Labs  Lab 05/24/18 0515 05/26/18 0320 05/27/18 0354 05/28/18 0449 05/29/18 0440 05/29/18 1207  NA 141 141 140 139  --  139  K 3.8 3.5 3.2* 3.6  --  3.6  CL 105 105 103 109  --  108  CO2 23 25 25 24   --  23  GLUCOSE 217* 125* 132* 163*  --  156*  BUN 18 15 15 15   --  15  CREATININE 0.77 0.62 0.74 0.71  --  0.75  CALCIUM 8.7* 8.3* 8.0* 7.6*  --  7.8*  MG 1.9  --   --  2.0  --   --   PHOS 2.7  --   --  1.9* 2.9  --     GFR: Estimated Creatinine Clearance: 96.1 mL/min (by C-G  formula based on SCr of 0.75 mg/dL).  Liver Function Tests: Recent Labs  Lab 05/24/18  0515 05/28/18 0449  AST 35 36  ALT 22 28  ALKPHOS 63 78  BILITOT 0.3 0.2*  PROT 6.0* 5.7*  ALBUMIN 2.1* 2.0*   No results for input(s): LIPASE, AMYLASE in the last 168 hours. No results for input(s): AMMONIA in the last 168 hours.  Coagulation Profile: No results for input(s): INR, PROTIME in the last 168 hours.  Cardiac Enzymes: No results for input(s): CKTOTAL, CKMB, CKMBINDEX, TROPONINI in the last 168 hours.  BNP (last 3 results) No results for input(s): PROBNP in the last 8760 hours.  HbA1C: No results for input(s): HGBA1C in the last 72 hours.  CBG: Recent Labs  Lab 05/28/18 2012 05/29/18 0016 05/29/18 0505 05/29/18 0759 05/29/18 1225  GLUCAP 126* 135* 129* 145* 148*    Lipid Profile: Recent Labs    05/28/18 0449  TRIG 139    Thyroid Function Tests: No results for input(s): TSH, T4TOTAL, FREET4, T3FREE, THYROIDAB in the last 72 hours.  Anemia Panel: No results for input(s): VITAMINB12, FOLATE, FERRITIN, TIBC, IRON, RETICCTPCT in the last 72 hours.  Urine analysis:    Component Value Date/Time   COLORURINE YELLOW 05/14/2018 2014   APPEARANCEUR CLOUDY (A) 05/14/2018 2014   LABSPEC 1.023 05/14/2018 2014   PHURINE 5.0 05/14/2018 2014   GLUCOSEU >=500 (A) 05/14/2018 2014   HGBUR MODERATE (A) 05/14/2018 2014   BILIRUBINUR NEGATIVE 05/14/2018 2014   KETONESUR 20 (A) 05/14/2018 2014   PROTEINUR 100 (A) 05/14/2018 2014   NITRITE NEGATIVE 05/14/2018 2014   LEUKOCYTESUR TRACE (A) 05/14/2018 2014    Sepsis Labs: Lactic Acid, Venous    Component Value Date/Time   LATICACIDVEN 1.5 05/27/2018 1701    MICROBIOLOGY: Recent Results (from the past 240 hour(s))  Culture, blood (routine x 2)     Status: None   Collection Time: 05/24/18  7:20 PM  Result Value Ref Range Status   Specimen Description BLOOD LEFT ANTECUBITAL  Final   Special Requests   Final    BOTTLES  DRAWN AEROBIC AND ANAEROBIC Blood Culture adequate volume   Culture   Final    NO GROWTH 5 DAYS Performed at [facility], [place] [address], [place], [place] [address]    Report Status 05/29/2018 FINAL  Final  Culture, blood (routine x 2)     Status: None (Preliminary result)   Collection Time: 05/24/18 10:28 PM  Result Value Ref Range Status   Specimen Description BLOOD LEFT THUMB  Final   Special Requests   Final    BOTTLES DRAWN AEROBIC ONLY Blood Culture adequate volume   Culture   Final    NO GROWTH 4 DAYS Performed at [facility], [place] [address] [place], [place] [address]    Report Status PENDING  Incomplete  Culture, blood (routine x 2)     Status: None (Preliminary result)   Collection Time: 05/27/18 11:54 AM  Result Value Ref Range Status   Specimen Description BLOOD LEFT ANTECUBITAL  Final   Special Requests   Final    BOTTLES DRAWN AEROBIC ONLY Blood Culture results may not be optimal due to an inadequate volume of blood received in culture bottles   Culture   Final    NO GROWTH 2 DAYS Performed at [facility], [place] [address], [place],  [address]    Report Status PENDING  Incomplete  Culture, blood (routine x 2)     Status: None (Preliminary result)   Collection Time: 05/27/18 12:04 PM  Result Value Ref  Range Status   Specimen Description BLOOD LEFT ANTECUBITAL  Final   Special Requests   Final    BOTTLES DRAWN AEROBIC ONLY Blood Culture results may not be optimal due to an inadequate volume of blood received in culture bottles   Culture   Final    NO GROWTH 2 DAYS Performed at [facility] [address] [place], [place] [address]    Report Status PENDING  Incomplete    RADIOLOGY STUDIES/RESULTS: Ct Head Wo Contrast  Result Date: 05/18/2018 CLINICAL DATA:  Altered level of consciousness. Patient following commands intermittently. EXAM: CT HEAD WITHOUT CONTRAST TECHNIQUE: Contiguous axial images were obtained from the base of  the skull through the vertex without intravenous contrast. COMPARISON:  None. FINDINGS: Brain: No evidence of acute infarction, hemorrhage, hydrocephalus, extra-axial collection or mass lesion/mass effect. Vascular: No hyperdense vessel or unexpected calcification. Skull: Normal. Negative for fracture or focal lesion. Sinuses/Orbits: Globes and orbits are unremarkable. There is mild mucosal thickening lining the ethmoid air cells and left sphenoid sinus with minimal right sphenoid sinus and maxillary sinus mucosal thickening. Other: None. IMPRESSION: 1. No intracranial abnormalities. 2. Sinus mucosal thickening. Electronically Signed   By: Lajean Manes M.D.   On: 05/18/2018 14:29   Ct Chest W Contrast  Result Date: 05/28/2018 CLINICAL DATA:  Chest congestion and nonproductive cough. EXAM: CT CHEST WITH CONTRAST TECHNIQUE: Multidetector CT imaging of the chest was performed during intravenous contrast administration. CONTRAST:  31mL OMNIPAQUE IOHEXOL 300 MG/ML  SOLN COMPARISON:  Chest radiographs, 05/28/2018 and prior exams. FINDINGS: Cardiovascular: Heart is normal in size. No pericardial effusion. Great vessels are normal in caliber. No aortic atherosclerosis. Mediastinum/Nodes: Mild prominence of the right thyroid lobe with areas of decreased attenuation. Suspect multiple nodules, not well-defined by CT. No neck base or axillary masses or enlarged lymph nodes. No mediastinal or hilar masses or adenopathy. Trachea is unremarkable. Mild distention of the distal esophagus versus a small hiatal hernia. There is a focus of increased attenuation within this, likely some ingested dense material. No esophageal mass or wall thickening. Lungs/Pleura: Trace right pleural effusion. Opacity in the right lower lobe, most evident in the central lower lobe. This may reflect atelectasis or infection. There is a small area of consolidation right upper lobe centered on image 49, series 5. Small amount of dependent opacity is  noted in the right upper lobe against the minor fissure, likely atelectasis. Additional areas hazy ground-glass type opacity in both lungs, mostly in the upper lobes and right middle lobe. This may be due to infection/inflammation or air trapping from airways disease. Low lung volumes accentuates these findings. No lung mass or nodule. No evidence of pulmonary edema. No pneumothorax. Upper Abdomen: No acute abnormality. Musculoskeletal: No chest wall abnormality. No acute or significant osseous findings. IMPRESSION: 1. Consolidation in the right lower lobe, which may reflect atelectasis or pneumonia. Suspect pneumonia at least as a component. There is a small area of consolidation in the right upper lobe, consistent with an additional focus of pneumonia. Both lungs show areas of ground-glass opacity which may reflect additional infection/inflammation or be due to airways disease and air trapping. 2. No evidence of pulmonary edema. 3. Minimal right pleural effusion. Electronically Signed   By: Lajean Manes M.D.   On: 05/28/2018 11:25   Ct Abdomen Pelvis W Contrast  Result Date: 05/24/2018 CLINICAL DATA:  Postop course prolonged and complicated by ileus-on TNA-due to worsening leukocytosis. Patient underwent exploratory laparotomy on 05/16/2018 small bowel resection. Concern  for ileus versus small bowel obstruction or abscess. Leukocytosis. Question of anastomotic leak. EXAM: CT ABDOMEN AND PELVIS WITH CONTRAST TECHNIQUE: Multidetector CT imaging of the abdomen and pelvis was performed using the standard protocol following bolus administration of intravenous contrast. CONTRAST:  127mL OMNIPAQUE IOHEXOL 300 MG/ML  SOLN COMPARISON:  CT of the abdomen and pelvis on 05/14/2018 FINDINGS: Lower chest: There is new consolidation at the RIGHT LOWER lobe. Subsegmental atelectasis is identified at the LEFT lung base. Hepatobiliary: Small cysts within the liver measures 1.1 centimeters. The gallbladder is present and  contains faintly radiopaque calculus or calculi. Pancreas: Unremarkable. No pancreatic ductal dilatation or surrounding inflammatory changes. Spleen: Normal in size without focal abnormality. Adrenals/Urinary Tract: Adrenal glands are unremarkable. Kidneys are normal, without renal calculi, focal lesion, or hydronephrosis. Bladder is unremarkable. Stomach/Bowel: The stomach is normal in appearance. There has been interval resection of abnormal small bowel loop in the RIGHT LOWER QUADRANT. The anastomosis in the RIGHT LOWER QUADRANT appears unremarkable. There are persistent dilated loops of small bowel particularly in the central abdomen showing multiple caliber changes, suggestive of adhesions. Numerous air-fluid levels are identified in the central small bowel loops. Although there is fluid in the mesentery and RIGHT LOWER QUADRANT, there is no evidence for anastomotic leak of contrast. No free intraperitoneal air. There are scattered colonic diverticula but no evidence for acute diverticulitis. There is central mesenteric edema. Colon is normal in caliber. Vascular/Lymphatic: No significant vascular findings are present. No enlarged abdominal or pelvic lymph nodes. Reproductive: Uterus is enlarged and contains numerous low-attenuation lesions, suggestive of degenerating fibroids. No adnexal mass. Other: No free pelvic fluid. Mesenteric ascites. Edematous changes identified within the anterior abdominal wall. Postoperative changes in the anterior abdomen. Musculoskeletal: No acute or significant osseous findings. IMPRESSION: 1. Interval resection of abnormal small bowel loop in the RIGHT LOWER QUADRANT. 2. Persistent dilated loops of small bowel in the central abdomen, suggestive of adhesions, and consistent with small bowel obstruction. 3. No evidence for anastomotic leak. 4. Mesenteric edema. 5. New consolidation at the RIGHT lung base. 6. Cholelithiasis. 7. Enlarged uterus containing numerous low-attenuation  lesions, suggestive of degenerating fibroids. Electronically Signed   By: Nolon Nations M.D.   On: 05/24/2018 16:06   Dg Chest Port 1 View  Result Date: 05/28/2018 CLINICAL DATA:  58 year old female with shortness breath. Diabetes. Subsequent encounter. EXAM: PORTABLE CHEST 1 VIEW COMPARISON:  05/27/2018 chest x-ray. 05/24/2018 CT abdomen and pelvis. FINDINGS: Persistent elevated right hemidiaphragm. Right mid to lower lobe consolidation may represent atelectasis or infiltrate and appears similar to prior exam. Central pulmonary vascular congestion. Mild cardiomegaly. No pneumothorax detected. Right PICC line tip mid superior vena cava level. No acute osseous abnormality. IMPRESSION: 1. Persistent elevated right hemidiaphragm. Right mid to lower lobe consolidation may represent atelectasis or infiltrate and appears similar to prior exam. 2. Central pulmonary vascular congestion. 3. Mild cardiomegaly. Electronically Signed   By: Genia Del M.D.   On: 05/28/2018 08:01   Dg Chest Port 1 View  Result Date: 05/27/2018 CLINICAL DATA:  Shortness of breath and cough EXAM: PORTABLE CHEST 1 VIEW COMPARISON:  Yesterday FINDINGS: Streaky opacities at the bases with elevated right diaphragm. Cardiomegaly. No Kerley lines or pneumothorax. Unremarkable right upper extremity PICC position. IMPRESSION: No change in atelectatic opacities at the bases, more extensive on the right. Electronically Signed   By: Monte Fantasia M.D.   On: 05/27/2018 09:12   Dg Chest Port 1 View  Result Date: 05/17/2018 CLINICAL DATA:  Endotracheally  intubated. EXAM: PORTABLE CHEST 1 VIEW COMPARISON:  05/15/2018 FINDINGS: Endotracheal tube terminates 2.5 cm above the carina. Enteric tube courses into the abdomen with tip not imaged and side hole below the diaphragm. Right jugular catheter terminates near the superior cavoatrial junction. The cardiomediastinal silhouette is unchanged with normal heart size. Mild bibasilar opacities have  slightly improved. No sizable pleural effusion or pneumothorax is identified. IMPRESSION: Decreased, mild bibasilar atelectasis. Electronically Signed   By: Logan Bores M.D.   On: 05/17/2018 07:14   Dg Chest Port 1 View  Result Date: 05/15/2018 CLINICAL DATA:  Ventilator support EXAM: PORTABLE CHEST 1 VIEW COMPARISON:  05/14/2018 FINDINGS: Endotracheal tube tip is 2 cm above the carina. Nasogastric tube enters the stomach. Right internal jugular central line tip is in the proximal right atrium. No pneumothorax. Mild bilateral lower lobe atelectasis. IMPRESSION: Lines and tubes well positioned. Mild basilar atelectasis. Electronically Signed   By: Nelson Chimes M.D.   On: 05/15/2018 07:41   Dg Chest Portable 1 View  Result Date: 05/14/2018 CLINICAL DATA:  Abdominal pain for several days EXAM: PORTABLE CHEST 1 VIEW COMPARISON:  CT from earlier in the same day. FINDINGS: Elevation of the right hemidiaphragm is again seen. Cardiac shadow is within normal limits. Mild right basilar atelectatic changes are noted. No sizable effusion is seen. No bony abnormality is noted. IMPRESSION: Mild right basilar atelectasis. Electronically Signed   By: Inez Catalina M.D.   On: 05/14/2018 15:26   Dg Chest Port 1v Same Day  Result Date: 05/27/2018 CLINICAL DATA:  Shortness of breath, history diabetes mellitus, GERD, hypertension EXAM: PORTABLE CHEST 1 VIEW COMPARISON:  Portable exam 1624 hours compared to 05/27/2018 FINDINGS: Normal heart size, mediastinal contours, and pulmonary vascularity. RIGHT arm PICC line tip projects over SVC. Chronic elevation of RIGHT diaphragm with subsegmental atelectasis at RIGHT base. Central peribronchial thickening. No infiltrate, pleural effusion or pneumothorax. IMPRESSION: Bronchitic changes with persistent RIGHT basilar atelectasis. Electronically Signed   By: Lavonia Dana M.D.   On: 05/27/2018 16:59   Dg Chest Port 1v Same Day  Result Date: 05/26/2018 CLINICAL DATA:  58 year old female  with shortness of breath. Recent abdominal surgery. EXAM: PORTABLE CHEST 1 VIEW COMPARISON:  Prior chest x-ray 05/17/2018 FINDINGS: The patient has been extubated and the nasogastric tube removed. Additionally, the right IJ central venous catheter has been removed. A right upper extremity PICC has been placed. The catheter tip overlies the distal SVC. Dense band of linear airspace opacity in the right lung obscuring the right cardiac margin favored to represent right middle lobar collapse/atelectasis. Additionally, there is linear atelectasis in the left lower lobe. No evidence of pulmonary edema or pneumothorax. No acute osseous abnormality. IMPRESSION: 1. Multifocal atelectasis including right middle lobe collapse. This may be secondary to mucous plugging. 2. Well-positioned right upper extremity approach PICC. Electronically Signed   By: Jacqulynn Cadet M.D.   On: 05/26/2018 10:19   Dg Abd Portable 1v  Result Date: 05/26/2018 CLINICAL DATA:  58 year old female with abdominal distension and shortness of breath EXAM: PORTABLE ABDOMEN - 1 VIEW COMPARISON:  CT abdomen/pelvis 05/24/2018 FINDINGS: Persistent diffuse gaseous distention of multiple loops of small bowel throughout the abdomen. No massive free air visualized on this supine radiograph. Lower lumbar degenerative disc disease again noted. IMPRESSION: Persistent gaseous distention of multiple loops of small bowel throughout the abdomen most consistent with ileus. Electronically Signed   By: Jacqulynn Cadet M.D.   On: 05/26/2018 10:18   Dg Abd Portable 1v  Result Date: 05/24/2018 CLINICAL DATA:  Initial evaluation for acute vomiting, small bowel obstruction. EXAM: PORTABLE ABDOMEN - 1 VIEW COMPARISON:  Prior CT from 05/14/2018. FINDINGS: Multiple dilated gas-filled loops of small bowel seen within the left and mid abdomen, compatible with ongoing small bowel obstruction. Loops dilated up to approximately 4 cm in diameter. Paucity of gas distally. No  soft tissue mass or abnormal calcification. No appreciable free air on these limited supine views of the abdomen. IMPRESSION: Multiple persistent gas-filled dilated loops of small bowel within the left and mid abdomen, compatible with ongoing small bowel obstruction. Electronically Signed   By: Jeannine Boga M.D.   On: 05/24/2018 06:00   Ct Renal Stone Study  Result Date: 05/14/2018 CLINICAL DATA:  58 y/o F; nausea, vomiting, and abdominal pain for 3 days. EXAM: CT ABDOMEN AND PELVIS WITHOUT CONTRAST TECHNIQUE: Multidetector CT imaging of the abdomen and pelvis was performed following the standard protocol without IV contrast. COMPARISON:  None. FINDINGS: Lower chest: Platelike atelectasis in the lung bases. Hepatobiliary: No focal liver abnormality is seen. No gallstones, gallbladder wall thickening, or biliary dilatation. Pancreas: Unremarkable. No pancreatic ductal dilatation or surrounding inflammatory changes. Spleen: Normal in size without focal abnormality. Adrenals/Urinary Tract: Adrenal glands are unremarkable. Kidneys are normal, without renal calculi, focal lesion, or hydronephrosis. Bladder is unremarkable. Stomach/Bowel: Severe diffuse small-bowel obstruction with transition at the level of the distal ileum in the right lower quadrant (series 3, image 65). Mild sigmoid diverticulosis without findings of acute diverticulitis. Otherwise normal appearance of the colon. Vascular/Lymphatic: Aortic atherosclerosis. No enlarged abdominal or pelvic lymph nodes. Reproductive: Negative. Other: There are a few tiny foci of pneumoperitoneum at the liver hilum, anterior abdominal wall, and below the diaphragm (series 3, image 12, 24, 27). Small volume of ascites. Musculoskeletal: No fracture is seen. Lumbar spondylosis with prominent facet arthropathy. IMPRESSION: 1. Severe diffuse small bowel obstruction with transition level of distal ileum in right lower quadrant. 2. Few punctate foci of  pneumoperitoneum indicating small/micro perforation. Small volume of ascites. These results were called by telephone at the time of interpretation on 05/14/2018 at 3:27 pm to Dr. Andy Gauss, who verbally acknowledged these results. Electronically Signed   By: Kristine Garbe M.D.   On: 05/14/2018 15:34   [place] Ekg Site Rite  Result Date: 05/23/2018 If Site Rite image not attached, placement could not be confirmed due to current cardiac rhythm.    LOS: 15 days   Oren Binet, MD  [facility]  If 7PM-7AM, please contact night-coverage  Please page via [email or web address]-click on MD name and type text message  05/29/2018, 2:25 PM

## 2018-05-29 NOTE — Progress Notes (Signed)
Patient ID: Wendy [name], female   DOB: Feb 21, 1961, 58 y.o.   MRN: [id number]    13 Days Post-Op  Subjective: Patient still with respiratory issues.  Says she is having some nausea even with just water, but is trying to take in some.  Objective: Vital signs in last 24 hours: Temp:  [98.4 F (36.9 C)-101.5 F (38.6 C)] 100 F (37.8 C) (02/11 0838) Pulse Rate:  [98-113] 98 (02/11 0300) Resp:  [16-22] 22 (02/11 0300) BP: (119-157)/(73-93) 119/74 (02/11 0300) SpO2:  [96 %-100 %] 96 % (02/11 0300) FiO2 (%):  [21 %] 21 % (02/10 1426) Last BM Date: 05/28/18  Intake/Output from previous day: 02/10 0701 - 02/11 0700 In: 606.5 [IV Piggyback:556.5] Out: 2300 [Urine:2300] Intake/Output this shift: No intake/output data recorded.  PE: Heart: tachy Lungs: course, rhonchi Abd: soft, some BS, midline wound with VAC in place, appropriately tender  Lab Results:  Recent Labs    05/27/18 0354 05/28/18 0449  WBC 10.4 8.1  HGB 8.1* 8.4*  HCT 26.6* 27.8*  PLT 325 281   BMET Recent Labs    05/27/18 0354 05/28/18 0449  NA 140 139  K 3.2* 3.6  CL 103 109  CO2 25 24  GLUCOSE 132* 163*  BUN 15 15  CREATININE 0.74 0.71  CALCIUM 8.0* 7.6*   PT/INR No results for input(s): LABPROT, INR in the last 72 hours. CMP     Component Value Date/Time   NA 139 05/28/2018 0449   NA 144 12/25/2014 1536   K 3.6 05/28/2018 0449   CL 109 05/28/2018 0449   CO2 24 05/28/2018 0449   GLUCOSE 163 (H) 05/28/2018 0449   BUN 15 05/28/2018 0449   BUN 9 12/25/2014 1536   CREATININE 0.71 05/28/2018 0449   CREATININE 0.69 05/25/2017 1150   CALCIUM 7.6 (L) 05/28/2018 0449   PROT 5.7 (L) 05/28/2018 0449   PROT 6.7 12/25/2014 1536   ALBUMIN 2.0 (L) 05/28/2018 0449   ALBUMIN 4.4 12/25/2014 1536   AST 36 05/28/2018 0449   ALT 28 05/28/2018 0449   ALKPHOS 78 05/28/2018 0449   BILITOT 0.2 (L) 05/28/2018 0449   BILITOT 0.3 12/25/2014 1536   GFRNONAA >60 05/28/2018 0449   GFRNONAA 78 08/25/2016 1100   GFRAA >60 05/28/2018 0449   GFRAA >89 08/25/2016 1100   Lipase     Component Value Date/Time   LIPASE 37 05/14/2018 2014       Studies/Results: Ct Chest W Contrast  Result Date: 05/28/2018 CLINICAL DATA:  Chest congestion and nonproductive cough. EXAM: CT CHEST WITH CONTRAST TECHNIQUE: Multidetector CT imaging of the chest was performed during intravenous contrast administration. CONTRAST:  85mL OMNIPAQUE IOHEXOL 300 MG/ML  SOLN COMPARISON:  Chest radiographs, 05/28/2018 and prior exams. FINDINGS: Cardiovascular: Heart is normal in size. No pericardial effusion. Great vessels are normal in caliber. No aortic atherosclerosis. Mediastinum/Nodes: Mild prominence of the right thyroid lobe with areas of decreased attenuation. Suspect multiple nodules, not well-defined by CT. No neck base or axillary masses or enlarged lymph nodes. No mediastinal or hilar masses or adenopathy. Trachea is unremarkable. Mild distention of the distal esophagus versus a small hiatal hernia. There is a focus of increased attenuation within this, likely some ingested dense material. No esophageal mass or wall thickening. Lungs/Pleura: Trace right pleural effusion. Opacity in the right lower lobe, most evident in the central lower lobe. This may reflect atelectasis or infection. There is a small area of consolidation right upper lobe centered on image 49, series 5.  Small amount of dependent opacity is noted in the right upper lobe against the minor fissure, likely atelectasis. Additional areas hazy ground-glass type opacity in both lungs, mostly in the upper lobes and right middle lobe. This may be due to infection/inflammation or air trapping from airways disease. Low lung volumes accentuates these findings. No lung mass or nodule. No evidence of pulmonary edema. No pneumothorax. Upper Abdomen: No acute abnormality. Musculoskeletal: No chest wall abnormality. No acute or significant osseous findings. IMPRESSION: 1. Consolidation  in the right lower lobe, which may reflect atelectasis or pneumonia. Suspect pneumonia at least as a component. There is a small area of consolidation in the right upper lobe, consistent with an additional focus of pneumonia. Both lungs show areas of ground-glass opacity which may reflect additional infection/inflammation or be due to airways disease and air trapping. 2. No evidence of pulmonary edema. 3. Minimal right pleural effusion. Electronically Signed   By: Lajean Manes M.D.   On: 05/28/2018 11:25   Dg Chest Port 1 View  Result Date: 05/28/2018 CLINICAL DATA:  58 year old female with shortness breath. Diabetes. Subsequent encounter. EXAM: PORTABLE CHEST 1 VIEW COMPARISON:  05/27/2018 chest x-ray. 05/24/2018 CT abdomen and pelvis. FINDINGS: Persistent elevated right hemidiaphragm. Right mid to lower lobe consolidation may represent atelectasis or infiltrate and appears similar to prior exam. Central pulmonary vascular congestion. Mild cardiomegaly. No pneumothorax detected. Right PICC line tip mid superior vena cava level. No acute osseous abnormality. IMPRESSION: 1. Persistent elevated right hemidiaphragm. Right mid to lower lobe consolidation may represent atelectasis or infiltrate and appears similar to prior exam. 2. Central pulmonary vascular congestion. 3. Mild cardiomegaly. Electronically Signed   By: Genia Del M.D.   On: 05/28/2018 08:01   Dg Chest Port 1v Same Day  Result Date: 05/27/2018 CLINICAL DATA:  Shortness of breath, history diabetes mellitus, GERD, hypertension EXAM: PORTABLE CHEST 1 VIEW COMPARISON:  Portable exam 1624 hours compared to 05/27/2018 FINDINGS: Normal heart size, mediastinal contours, and pulmonary vascularity. RIGHT arm PICC line tip projects over SVC. Chronic elevation of RIGHT diaphragm with subsegmental atelectasis at RIGHT base. Central peribronchial thickening. No infiltrate, pleural effusion or pneumothorax. IMPRESSION: Bronchitic changes with persistent RIGHT  basilar atelectasis. Electronically Signed   By: Lavonia Dana M.D.   On: 05/27/2018 16:59    Anti-infectives: Anti-infectives (From admission, onward)   Start     Dose/Rate Route Frequency Ordered Stop   05/28/18 0500  vancomycin (VANCOCIN) 1,250 mg in sodium chloride 0.9 % 250 mL IVPB     1,250 mg 166.7 mL/hr over 90 Minutes Intravenous Every 12 hours 05/27/18 1612     05/27/18 1700  vancomycin (VANCOCIN) 2,500 mg in sodium chloride 0.9 % 500 mL IVPB     2,500 mg 250 mL/hr over 120 Minutes Intravenous  Once 05/27/18 1612 05/27/18 1922   05/27/18 1600  meropenem (MERREM) 2 g in sodium chloride 0.9 % 100 mL IVPB     2 g 200 mL/hr over 30 Minutes Intravenous Every 8 hours 05/27/18 1544 06/04/18 0759   05/26/18 1300  Ampicillin-Sulbactam (UNASYN) 3 g in sodium chloride 0.9 % 100 mL IVPB  Status:  Discontinued     3 g 200 mL/hr over 30 Minutes Intravenous Every 6 hours 05/26/18 1234 05/27/18 1538   05/19/18 1400  vancomycin (VANCOCIN) 1,250 mg in sodium chloride 0.9 % 250 mL IVPB  Status:  Discontinued     1,250 mg 166.7 mL/hr over 90 Minutes Intravenous Every 24 hours 05/18/18 1343 05/19/18 1244  05/19/18 1400  meropenem (MERREM) 2 g in sodium chloride 0.9 % 100 mL IVPB     2 g 200 mL/hr over 30 Minutes Intravenous Every 8 hours 05/19/18 1244 05/26/18 2359   05/18/18 1600  cefTRIAXone (ROCEPHIN) 2 g in sodium chloride 0.9 % 100 mL IVPB  Status:  Discontinued     2 g 200 mL/hr over 30 Minutes Intravenous Every 24 hours 05/18/18 1326 05/19/18 1244   05/18/18 1400  vancomycin (VANCOCIN) 2,000 mg in sodium chloride 0.9 % 500 mL IVPB     2,000 mg 250 mL/hr over 120 Minutes Intravenous  Once 05/18/18 1343 05/18/18 1640   05/15/18 2333  ceFEPIme (MAXIPIME) 2 g in sodium chloride 0.9 % 100 mL IVPB  Status:  Discontinued     2 g 200 mL/hr over 30 Minutes Intravenous Every 8 hours 05/15/18 2226 05/18/18 1326   05/15/18 1500  ceFEPIme (MAXIPIME) 1 g in sodium chloride 0.9 % 100 mL IVPB  Status:   Discontinued     1 g 200 mL/hr over 30 Minutes Intravenous Every 24 hours 05/14/18 1512 05/15/18 2226   05/15/18 0000  anidulafungin (ERAXIS) 100 mg in sodium chloride 0.9 % 100 mL IVPB  Status:  Discontinued     100 mg 78 mL/hr over 100 Minutes Intravenous Every 24 hours 05/14/18 1728 05/15/18 1159   05/14/18 1800  anidulafungin (ERAXIS) 200 mg in sodium chloride 0.9 % 200 mL IVPB     200 mg 78 mL/hr over 200 Minutes Intravenous  Once 05/14/18 1751 05/14/18 2138   05/14/18 1745  vancomycin (VANCOCIN) 2,000 mg in sodium chloride 0.9 % 500 mL IVPB  Status:  Discontinued     2,000 mg 250 mL/hr over 120 Minutes Intravenous  Once 05/14/18 1741 05/15/18 1159   05/14/18 1741  vancomycin variable dose per unstable renal function (pharmacist dosing)  Status:  Discontinued      Does not apply See admin instructions 05/14/18 1741 05/15/18 1159   05/14/18 1515  ceFEPIme (MAXIPIME) 2 g in sodium chloride 0.9 % 100 mL IVPB     2 g 200 mL/hr over 30 Minutes Intravenous  Once 05/14/18 1512 05/14/18 1649   05/14/18 1515  metroNIDAZOLE (FLAGYL) IVPB 500 mg  Status:  Discontinued     500 mg 100 mL/hr over 60 Minutes Intravenous Every 8 hours 05/14/18 1512 05/19/18 1244       Assessment/Plan DKA- per medicine, resolved Sepsis- secondary to below intra-abdominal problem Right-side PNA - only pulls 750 at best on IS, per medicine  SBO,Pneumoperitoneum, intussusception with necrosis & perforation - POD 13, S/P ex lap, small bowel resection, closure with abthera vac, Dr. Grandville Silos, 01/17 -S/PExploratorylaparotomy with small bowel anastomosis and closure of abdominal fascia, Dr. Brantley Stage, 01/29 - NGTcame out 02/05 -CT scan2/6showing ileus vs SBO, anastomosis intact, no abscess -ileus- improving, but wants to cont on clears today - continue TPN, but will plan to wean if tolerates clears today - IS -PT for mobilization.  Patient MUST get out of bed in order to help herself recover.    FEN:CLD, TPN VTE: SCD's, startedlovenox 01/30 OZ:HYQM/VHQION for HCAP Foley:DC02/05 Follow up:Dr. Grandville Silos   LOS: 15 days    Henreitta Cea , [facility] 05/29/2018, 10:13 AM Pager: [phone]

## 2018-05-29 NOTE — Progress Notes (Signed)
Inpatient Rehabilitation-Admissions Coordinator   Continue to follow pt progress with therapies. Noted pt progressing well. Met with pt at the bedside to discuss options; [facility] will continue to follow for medical readiness and pt preference of post acute rehab venue.   Jhonnie Garner, OTR/L  Rehab Admissions Coordinator  [phone] 05/29/2018 6:35 PM

## 2018-05-29 NOTE — Progress Notes (Signed)
Encouraged IS with pt. At this time pt only able to obtain 750. Pt verbalizes understanding of importance of continuing to use the IS.

## 2018-05-29 NOTE — Progress Notes (Signed)
[place] NOTE   Pharmacy Consult for TPN Indication: ileus  Patient Measurements: Height: 5\' 3"  (160 cm) Weight: 262 lb 9.1 oz (119.1 kg) IBW/kg (Calculated) : 52.4 TPN AdjBW (KG): 68.8 Body mass index is 46.51 kg/m. Usual Weight: 117.9 kg  Assessment:  58 y/o female presenting to the ED with 3 days of vomiting and abdominal pain found to be in DKA. CT showed SBO. Noted SBO, pneumoperitoneum, intussusception with necrosis and performation and was taken to the OR emergently for ex lap, SBR left in discontinuity, and abthera VAC on 1/27. Taken back to the OR on 1/29 for ex lap with SB anastomosis and fascia closure. High risk for refeeding.  Husband reports patient eating well prior to experiencing GI distress.  GI: SBO, pneumoperitoneum, intussusception with necrosis and perforation. Patient reporting nausea even with water. Albumin 2.1>2, prealbumin improving 10.1>14.4>17.8, NG removed 2/5. +flatus and  LBM 2/7.  PPI IV for GERD - CT scan 2/6 showing ileus vs SBO, anastomosis intact, no abscess Ileus improving - continuing CLD at this point  Endo: hx DM, CBGs 120 - 150s Insulin requirements in the past 24 hours: 7 units SSI, 60 units in TPN, and 24 units of Lantus  Lytes: K 3.6 (goal >= 4), Mag 2 (goal >=2) Phos 1.9 > 2.9 Renal: AKI resolved, SCr stable Pulm: 2L [place] (Mucomyst nebs, Flonase, duonebs) Cards: h/o HLD, HTN Hepatobil: LFT and T bili WNL, TG 139 Neuro: Effexor ID: s/p meropenem for IAI ended 2/8, Afebrile, WBC 10.4 declining.  Now Unasyn started 2/8 for PNA. Spiked temp 102.9 last PM 2/8. 1/27: Clostridium [place] 1/27: Peritoneal fluid: Enterococcus faecium, Clostridium TPN Access: double lumen CVC placed 05/14/18 TPN start date: 05/19/18  Nutritional Goals (per RD recommendation on 05/19/18): KCal: 2100-2300 Protein: 116-128 g Fluid: >1.6L  Current Nutrition:  TPN CLD  Plan:  Continue TPN at 77mL/hr - This TPN  provides 127 g of protein, 302 g of dextrose, and 58 g of lipids which provides 2,120 kCals per day, meeting 100% of patient needs  Electrolytes in TPN: continue same as previous   Continue MVI, trace elements in TPN  Continue insulin 60 units in TPN Continue resistant SSI Q4h and Lantus 24 units   Monitor TPN labs Mon/Thurs F/U tolerance of CLD today and possible weaning of TPN Wednesday   Levester Fresh, PharmD, BCPS, BCCCP Clinical Pharmacist [phone]  Please check AMION for all [place] numbers  05/29/2018 10:33 AM

## 2018-05-29 NOTE — Progress Notes (Signed)
Chest PT encourages patient refusing at this time.  RT will continue to monitor.

## 2018-05-29 NOTE — Progress Notes (Signed)
This RRT offered pt vest therapy to which pt refused at this time. Flutter therapy performed x10 with fair effort given and weak cough. RRT will continue to offer vest therapy.

## 2018-05-29 NOTE — Progress Notes (Signed)
This RRT encouraged vest/flutter therapy and pt continues to refuse. Physician into pt room during aerosol tx and encrouaged vest/flutter therapy as well. Pt verbalized understanding. RRT again tried to offer vest therapy after physician left and pt again refused at this time. Pt states the pain in her abdomen is to much. RRT will continue to encouraged vest/flutter therapies to pt.

## 2018-05-29 NOTE — Progress Notes (Signed)
Occupational Therapy Treatment Patient Details Name: Wendy [name] MRN: [id number] DOB: [birth year] Today's Date: 05/29/2018    History of present illness 58 year old female who presented with 3-day history of abdominal pain, emesis, anorexia, and polyuria for the past 3 days.  Labs notable hyperglycemia 771 with anion gap of 21.  CT abdomen pelvis showed small bowel obstruction with pneumoperitoneum and she underwent ex lap with small bowel resection.   OT comments  Pt making good progress with functional goals.OT will continue to follow acutely  Follow Up Recommendations  CIR;Supervision/Assistance - 24 hour    Equipment Recommendations  Other (comment)(TBD at next venue of care)    Recommendations for Other Services      Precautions / Restrictions Precautions Precautions: Fall;Other (comment) Precaution Comments: wound vac,  central line, foley Restrictions Weight Bearing Restrictions: No       Mobility Bed Mobility Overal bed mobility: Needs Assistance Bed Mobility: Sit to Supine;Supine to Sit     Supine to sit: Min assist Sit to supine: Mod assist   General bed mobility comments: Required assistance to elevate trunk and lift B LEs back to bed   Transfers Overall transfer level: Needs assistance Equipment used: Rolling walker (2 wheeled) Transfers: Sit to/from Stand Sit to Stand: Supervision         General transfer comment: Cues for correct hand placement     Balance Overall balance assessment: Needs assistance Sitting-balance support: Feet supported;Bilateral upper extremity supported Sitting balance-Leahy Scale: Fair     Standing balance support: Bilateral upper extremity supported;During functional activity Standing balance-Leahy Scale: Poor                             ADL either performed or assessed with clinical judgement   ADL Overall ADL's : Needs assistance/impaired     Grooming: Sitting;Supervision/safety   Upper Body Bathing:  Supervision/ safety;Set up;Sitting       Upper Body Dressing : Supervision/safety;Set up;Sitting       Toilet Transfer: BSC;Supervision/safety;Ambulation;Cueing for safety   Toileting- Clothing Manipulation and Hygiene: Minimal assistance;Sit to/from stand       Functional mobility during ADLs: Supervision/safety;Rolling walker;Cueing for safety       Vision Patient Visual Report: No change from baseline     Perception     Praxis      Cognition Arousal/Alertness: Awake/alert Behavior During Therapy: WFL for tasks assessed/performed Overall Cognitive Status: Within Functional Limits for tasks assessed                                          Exercises     Shoulder Instructions       General Comments      Pertinent Vitals/ Pain       Pain Assessment: 0-10 Pain Score: 6  Pain Location: abdomen Pain Descriptors / Indicators: Guarding;Grimacing;Sore Pain Intervention(s): Limited activity within patient's tolerance;Monitored during session;Repositioned  Home Living                                          Prior Functioning/Environment              Frequency  Min 2X/week        Progress Toward Goals  OT Goals(current goals can now be found  in the care plan section)  Progress towards OT goals: Progressing toward goals     Plan Discharge plan remains appropriate    Co-evaluation                 AM-PAC OT "6 Clicks" Daily Activity     Outcome Measure   Help from another person eating meals?: None Help from another person taking care of personal grooming?: A Little Help from another person toileting, which includes using toliet, bedpan, or urinal?: A Little Help from another person bathing (including washing, rinsing, drying)?: A Lot Help from another person to put on and taking off regular upper body clothing?: A Little Help from another person to put on and taking off regular lower body clothing?: A  Lot 6 Click Score: 17    End of Session Equipment Utilized During Treatment: Gait belt;Other (comment)(BSC)  OT Visit Diagnosis: Unsteadiness on feet (R26.81);Other abnormalities of gait and mobility (R26.89);Muscle weakness (generalized) (M62.81);Other symptoms and signs involving cognitive function;Pain Pain - part of body: (abdomen)   Activity Tolerance Patient tolerated treatment well;Patient limited by fatigue   Patient Left with call bell/phone within reach;in bed;with bed alarm set   Nurse Communication          Time: 3568-6168 OT Time Calculation (min): 30 min  Charges: OT General Charges $OT Visit: 1 Visit OT Treatments $Self Care/Home Management : 8-22 mins $Therapeutic Activity: 8-22 mins     Britt Bottom 05/29/2018, 2:34 PM

## 2018-05-30 ENCOUNTER — Inpatient Hospital Stay (HOSPITAL_COMMUNITY): Payer: 59

## 2018-05-30 LAB — CBC
HCT: 24.7 % — ABNORMAL LOW (ref 36.0–46.0)
Hemoglobin: 7.8 g/dL — ABNORMAL LOW (ref 12.0–15.0)
MCH: 26.7 pg (ref 26.0–34.0)
MCHC: 31.6 g/dL (ref 30.0–36.0)
MCV: 84.6 fL (ref 80.0–100.0)
Platelets: 271 10*3/uL (ref 150–400)
RBC: 2.92 MIL/uL — ABNORMAL LOW (ref 3.87–5.11)
RDW: 17.2 % — ABNORMAL HIGH (ref 11.5–15.5)
WBC: 5.9 10*3/uL (ref 4.0–10.5)
nRBC: 0 % (ref 0.0–0.2)

## 2018-05-30 LAB — GLUCOSE, CAPILLARY
GLUCOSE-CAPILLARY: 112 mg/dL — AB (ref 70–99)
Glucose-Capillary: 111 mg/dL — ABNORMAL HIGH (ref 70–99)
Glucose-Capillary: 119 mg/dL — ABNORMAL HIGH (ref 70–99)
Glucose-Capillary: 120 mg/dL — ABNORMAL HIGH (ref 70–99)
Glucose-Capillary: 134 mg/dL — ABNORMAL HIGH (ref 70–99)
Glucose-Capillary: 138 mg/dL — ABNORMAL HIGH (ref 70–99)

## 2018-05-30 LAB — BASIC METABOLIC PANEL
Anion gap: 4 — ABNORMAL LOW (ref 5–15)
BUN: 15 mg/dL (ref 6–20)
CO2: 26 mmol/L (ref 22–32)
Calcium: 7.6 mg/dL — ABNORMAL LOW (ref 8.9–10.3)
Chloride: 111 mmol/L (ref 98–111)
Creatinine, Ser: 0.75 mg/dL (ref 0.44–1.00)
GFR calc Af Amer: >60 mL/min (ref >60)
GFR calc non Af Amer: >60 mL/min (ref >60)
Glucose, Bld: 130 mg/dL — ABNORMAL HIGH (ref 70–99)
POTASSIUM: 3.7 mmol/L (ref 3.5–5.1)
Sodium: 141 mmol/L (ref 135–145)

## 2018-05-30 LAB — CULTURE, BLOOD (ROUTINE X 2)
Culture: NO GROWTH
Special Requests: ADEQUATE

## 2018-05-30 LAB — MAGNESIUM: Magnesium: 2.2 mg/dL (ref 1.7–2.4)

## 2018-05-30 MED ORDER — AMOXICILLIN-POT CLAVULANATE 875-125 MG PO TABS
1.0000 | ORAL_TABLET | Freq: Two times a day (BID) | ORAL | Status: DC
  Filled 2018-05-30: qty 1

## 2018-05-30 MED ORDER — TRAVASOL 10 % IV SOLN
INTRAVENOUS | Status: DC
  Administered 2018-05-30: 19:00:00 via INTRAVENOUS
  Filled 2018-05-30: qty 1274.4

## 2018-05-30 MED ORDER — FUROSEMIDE 10 MG/ML IJ SOLN
20.0000 mg | Freq: Once | INTRAMUSCULAR | Status: AC
  Administered 2018-05-30: 20 mg via INTRAVENOUS
  Filled 2018-05-30: qty 2

## 2018-05-30 MED ORDER — SODIUM CHLORIDE 0.9 % IV SOLN
3.0000 g | Freq: Four times a day (QID) | INTRAVENOUS | Status: DC
  Administered 2018-05-30 – 2018-05-31 (×3): 3 g via INTRAVENOUS
  Filled 2018-05-30 (×5): qty 3

## 2018-05-30 NOTE — Progress Notes (Signed)
Patient still lethargic. Not want to do more activity than getting up to the bathroom. Patient had 3 bowel movements loose consistency.

## 2018-05-30 NOTE — Progress Notes (Signed)
PROGRESS NOTE        PATIENT DETAILS Name: Wendy [name] Age: 58 y.o. Sex: female Date of Birth: 01-Dec-1960 Admit Date: 05/14/2018 Admitting Physician Rush Farmer, MD EHU:[id number], Drue Stager, MD  Brief Narrative: Patient is a 58 y.o. female with history of DM-2, pretension, GERD, morbid obesity who presented with a 3-day history of vomiting and abdominal pain-found to have sepsis secondary to small bowel obstruction and small perforation, along with DKA.  She was started on IV insulin, empiric antimicrobial therapy-General surgery was consulted-and patient was admitted to the ICU under the PCCM service.  Hospital course lately has been complicated by development of ileus, and pneumonia.  Significant Events: 1/27 Admit > OR for ex lap with small bowel resection> ICU 1/29 OR for anastomosis and closure 2/1 Extubated 2/2 Hypoactive delirium, worsening respiratory status 2/8>>Right PNA  Subjective: Sitting up in a bedside-still sounds congested-has refused chest PT/vest-I encouraged her to ambulate, use incentive spirometry/flutter valve and chest PT with vest when offered by RT.  I also encouraged her to ambulate as much as possible.  Claims had two loose BMs today.  Assessment/Plan: Acute hypoxic respiratory failure: Extubated on 2/1 (intubated for surgery) continue to wean to RA as able.  Septic shock secondary to peritonitis with bowel obstruction along with Clostridium bacteremia: Sepsis pathophysiology has resolved, blood cultures positive for Clostridium species.  Peritoneal fluid cultures positive for enterococcus and Clostridium as well.  Has completed a course of meropenem till 2/8.   Small bowel obstruction with perforation s/p resection and closure of the abdominal fascia with prolonged postop course due to ileus: Slowly improving, tolerating advancement in diet.  General surgery following and directing care.  Followed continues to have ileus-tolerating  advancement tolerating clear liquids.  General surgery following and directing care.  Remains on TNA-as diet advanced further, we can contemplate decreasing TNA support.  Fever secondary to right-sided pneumonia: Secondary to poor respiratory effort/cough effort-and refusal to be mobilized/ambulated. Unfortunately continues to refuse chest physiotherapy vibrator vest etc. Per Nursing staff-not keen on ambulating and mobilizing as well.  Her fever curve is definitely better-some low-grade fever overnight-we will stop vancomycin and meropenem and transition to Augmentin-with stop date of antimicrobial therapy on 2/14.  Chest x-ray on 2/12 with improvement as well.  Starting to have some loose stools (only 2 this am)-if worsens-may need to check for C. difficile given ongoing lingering fever.  Anemia: Secondary to acute/critical illness-no evidence of blood loss.  Hemoglobin continues to be low but stable.  Transfuse if significant drop in hemoglobin.  Hypernatremia: Resolved  Type II DM with uncontrolled hyperglycemia: Had severe hyperglycemia with probable DKA on admission-CBGs currently stable with 20 points of Lantus and SSI.  Continue to follow.    Hypertension: Blood pressure relatively well controlled-continue to monitor off antihypertensives-once oral intake has resumed we can resume her blood pressure medications.    GERD: Continue PPI  Dyslipidemia: Resume statin when oral intake is further stabilized  Debility/deconditioning: Secondary to acute illness/critical illness-CIR when more stable over the next few days.  DVT Prophylaxis: Prophylactic Lovenox   Code Status: Full code   Family Communication: None at bedside  Disposition Plan: Remain inpatient-suspect CIR on discharge  Antimicrobial agents: Anti-infectives (From admission, onward)   Start     Dose/Rate Route Frequency Ordered Stop   05/28/18 0500  vancomycin (VANCOCIN) 1,250 mg in  sodium chloride 0.9 % 250 mL IVPB   Status:  Discontinued     1,250 mg 166.7 mL/hr over 90 Minutes Intravenous Every 12 hours 05/27/18 1612 05/30/18 0936   05/27/18 1700  vancomycin (VANCOCIN) 2,500 mg in sodium chloride 0.9 % 500 mL IVPB     2,500 mg 250 mL/hr over 120 Minutes Intravenous  Once 05/27/18 1612 05/27/18 1922   05/27/18 1600  meropenem (MERREM) 2 g in sodium chloride 0.9 % 100 mL IVPB     2 g 200 mL/hr over 30 Minutes Intravenous Every 8 hours 05/27/18 1544 06/04/18 0759   05/26/18 1300  Ampicillin-Sulbactam (UNASYN) 3 g in sodium chloride 0.9 % 100 mL IVPB  Status:  Discontinued     3 g 200 mL/hr over 30 Minutes Intravenous Every 6 hours 05/26/18 1234 05/27/18 1538   05/19/18 1400  vancomycin (VANCOCIN) 1,250 mg in sodium chloride 0.9 % 250 mL IVPB  Status:  Discontinued     1,250 mg 166.7 mL/hr over 90 Minutes Intravenous Every 24 hours 05/18/18 1343 05/19/18 1244   05/19/18 1400  meropenem (MERREM) 2 g in sodium chloride 0.9 % 100 mL IVPB     2 g 200 mL/hr over 30 Minutes Intravenous Every 8 hours 05/19/18 1244 05/26/18 2359   05/18/18 1600  cefTRIAXone (ROCEPHIN) 2 g in sodium chloride 0.9 % 100 mL IVPB  Status:  Discontinued     2 g 200 mL/hr over 30 Minutes Intravenous Every 24 hours 05/18/18 1326 05/19/18 1244   05/18/18 1400  vancomycin (VANCOCIN) 2,000 mg in sodium chloride 0.9 % 500 mL IVPB     2,000 mg 250 mL/hr over 120 Minutes Intravenous  Once 05/18/18 1343 05/18/18 1640   05/15/18 2333  ceFEPIme (MAXIPIME) 2 g in sodium chloride 0.9 % 100 mL IVPB  Status:  Discontinued     2 g 200 mL/hr over 30 Minutes Intravenous Every 8 hours 05/15/18 2226 05/18/18 1326   05/15/18 1500  ceFEPIme (MAXIPIME) 1 g in sodium chloride 0.9 % 100 mL IVPB  Status:  Discontinued     1 g 200 mL/hr over 30 Minutes Intravenous Every 24 hours 05/14/18 1512 05/15/18 2226   05/15/18 0000  anidulafungin (ERAXIS) 100 mg in sodium chloride 0.9 % 100 mL IVPB  Status:  Discontinued     100 mg 78 mL/hr over 100 Minutes  Intravenous Every 24 hours 05/14/18 1728 05/15/18 1159   05/14/18 1800  anidulafungin (ERAXIS) 200 mg in sodium chloride 0.9 % 200 mL IVPB     200 mg 78 mL/hr over 200 Minutes Intravenous  Once 05/14/18 1751 05/14/18 2138   05/14/18 1745  vancomycin (VANCOCIN) 2,000 mg in sodium chloride 0.9 % 500 mL IVPB  Status:  Discontinued     2,000 mg 250 mL/hr over 120 Minutes Intravenous  Once 05/14/18 1741 05/15/18 1159   05/14/18 1741  vancomycin variable dose per unstable renal function (pharmacist dosing)  Status:  Discontinued      Does not apply See admin instructions 05/14/18 1741 05/15/18 1159   05/14/18 1515  ceFEPIme (MAXIPIME) 2 g in sodium chloride 0.9 % 100 mL IVPB     2 g 200 mL/hr over 30 Minutes Intravenous  Once 05/14/18 1512 05/14/18 1649   05/14/18 1515  metroNIDAZOLE (FLAGYL) IVPB 500 mg  Status:  Discontinued     500 mg 100 mL/hr over 60 Minutes Intravenous Every 8 hours 05/14/18 1512 05/19/18 1244      Procedures: 2/6>>PICC line 1/29>> exploratory laparotomy  with small bowel anastomosis and closure of abdominal fascia 1/27>>Right IJ (Anesthesia) 1/27>>EXPLORATORY LAPAROTOMY,SMALL BOWEL RESECTION,CLOSURE WITH ABTHERA VAC  CONSULTS:  pulmonary/intensive care and general surgery  Time spent: 25- minutes-Greater than 50% of this time was spent in counseling, explanation of diagnosis, planning of further management, and coordination of care.  MEDICATIONS: Scheduled Meds: . chlorhexidine  15 mL Mouth Rinse BID  . Chlorhexidine Gluconate Cloth  6 each Topical Daily  . enoxaparin (LOVENOX) injection  40 mg Subcutaneous Q24H  . fluticasone  2 spray Each Nare Daily  . guaiFENesin  600 mg Oral BID  . insulin aspart  0-20 Units Subcutaneous Q4H  . insulin glargine  24 Units Subcutaneous Daily  . ipratropium-albuterol  3 mL Nebulization TID  . mouth rinse  15 mL Mouth Rinse BID  . pantoprazole (PROTONIX) IV  40 mg Intravenous Q24H  . sodium chloride flush  10-40 mL  Intracatheter Q12H  . venlafaxine XR  150 mg Oral Daily   Continuous Infusions: . dextrose 10 mL/hr at 05/25/18 1455  . lactated ringers 50 mL/hr at 05/29/18 0700  . meropenem (MERREM) IV 2 g (05/30/18 0953)  . TPN ADULT (ION) 90 mL/hr at 05/29/18 1734  . TPN ADULT (ION)     PRN Meds:.acetaminophen, alum & mag hydroxide-simeth, fentaNYL (SUBLIMAZE) injection, ibuprofen, iopamidol, lip balm, sodium chloride flush, sodium chloride flush   PHYSICAL EXAM: Vital signs: Vitals:   05/30/18 0757 05/30/18 0800 05/30/18 0914 05/30/18 1251  BP: 135/78 135/78    Pulse:  (!) 102    Resp:  (!) 27    Temp: (!) 100.4 F (38 C)   100.2 F (37.9 C)  TempSrc: Oral   Oral  SpO2:  93% 92%   Weight:      Height:       Filed Weights   05/28/18 0539 05/29/18 1155 05/30/18 0550  Weight: 119.1 kg 120 kg 119 kg   Body mass index is 46.47 kg/m.   General appearance:Awake, alert, not in any distress.   Eyes:no scleral icterus. HEENT: Atraumatic and Normocephalic Neck: supple, no JVD. Resp:Good air entry bilaterally, still has a few bibasilar rales-and some transmitted upper airway sounds. CVS: S1 S2 regular, no murmurs.  GI: Bowel sounds present, Non tender and not distended with no gaurding, rigidity or rebound. Extremities: B/L Lower Ext shows + edema, both legs are warm to touch Neurology:  Non focal Psychiatric: Normal judgment and insight. Normal mood. Musculoskeletal:No digital cyanosis Skin:No Rash, warm and dry Wounds:N/A  I have personally reviewed following labs and imaging studies  LABORATORY DATA: CBC: Recent Labs  Lab 05/26/18 0320 05/27/18 0354 05/28/18 0449 05/29/18 1207 05/30/18 0342  WBC 13.5* 10.4 8.1 6.3 5.9  NEUTROABS  --   --  6.2  --   --   HGB 8.3* 8.1* 8.4* 8.1* 7.8*  HCT 26.4* 26.6* 27.8* 26.5* 24.7*  MCV 85.2 85.0 85.0 84.4 84.6  PLT 381 325 281 284 220    Basic Metabolic Panel: Recent Labs  Lab 05/24/18 0515 05/26/18 0320 05/27/18 0354  05/28/18 0449 05/29/18 0440 05/29/18 1207 05/30/18 0342  NA 141 141 140 139  --  139 141  K 3.8 3.5 3.2* 3.6  --  3.6 3.7  CL 105 105 103 109  --  108 111  CO2 23 25 25 24   --  23 26  GLUCOSE 217* 125* 132* 163*  --  156* 130*  BUN 18 15 15 15   --  15 15  CREATININE 0.77  0.62 0.74 0.71  --  0.75 0.75  CALCIUM 8.7* 8.3* 8.0* 7.6*  --  7.8* 7.6*  MG 1.9  --   --  2.0  --   --  2.2  PHOS 2.7  --   --  1.9* 2.9  --   --     GFR: Estimated Creatinine Clearance: 95.6 mL/min (by C-G formula based on SCr of 0.75 mg/dL).  Liver Function Tests: Recent Labs  Lab 05/24/18 0515 05/28/18 0449  AST 35 36  ALT 22 28  ALKPHOS 63 78  BILITOT 0.3 0.2*  PROT 6.0* 5.7*  ALBUMIN 2.1* 2.0*   No results for input(s): LIPASE, AMYLASE in the last 168 hours. No results for input(s): AMMONIA in the last 168 hours.  Coagulation Profile: No results for input(s): INR, PROTIME in the last 168 hours.  Cardiac Enzymes: No results for input(s): CKTOTAL, CKMB, CKMBINDEX, TROPONINI in the last 168 hours.  BNP (last 3 results) No results for input(s): PROBNP in the last 8760 hours.  HbA1C: No results for input(s): HGBA1C in the last 72 hours.  CBG: Recent Labs  Lab 05/29/18 1955 05/30/18 0024 05/30/18 0350 05/30/18 0756 05/30/18 1233  GLUCAP 140* 111* 120* 138* 119*    Lipid Profile: Recent Labs    05/28/18 0449  TRIG 139    Thyroid Function Tests: No results for input(s): TSH, T4TOTAL, FREET4, T3FREE, THYROIDAB in the last 72 hours.  Anemia Panel: No results for input(s): VITAMINB12, FOLATE, FERRITIN, TIBC, IRON, RETICCTPCT in the last 72 hours.  Urine analysis:    Component Value Date/Time   COLORURINE YELLOW 05/14/2018 2014   APPEARANCEUR CLOUDY (A) 05/14/2018 2014   LABSPEC 1.023 05/14/2018 2014   PHURINE 5.0 05/14/2018 2014   GLUCOSEU >=500 (A) 05/14/2018 2014   HGBUR MODERATE (A) 05/14/2018 2014   BILIRUBINUR NEGATIVE 05/14/2018 2014   KETONESUR 20 (A) 05/14/2018 2014    PROTEINUR 100 (A) 05/14/2018 2014   NITRITE NEGATIVE 05/14/2018 2014   LEUKOCYTESUR TRACE (A) 05/14/2018 2014    Sepsis Labs: Lactic Acid, Venous    Component Value Date/Time   LATICACIDVEN 1.5 05/27/2018 1701    MICROBIOLOGY: Recent Results (from the past 240 hour(s))  Culture, blood (routine x 2)     Status: None   Collection Time: 05/24/18  7:20 PM  Result Value Ref Range Status   Specimen Description BLOOD LEFT ANTECUBITAL  Final   Special Requests   Final    BOTTLES DRAWN AEROBIC AND ANAEROBIC Blood Culture adequate volume   Culture   Final    NO GROWTH 5 DAYS Performed at [facility], [place] [address] [place], [place] [address]    Report Status 05/29/2018 FINAL  Final  Culture, blood (routine x 2)     Status: None   Collection Time: 05/24/18 10:28 PM  Result Value Ref Range Status   Specimen Description BLOOD LEFT THUMB  Final   Special Requests   Final    BOTTLES DRAWN AEROBIC ONLY Blood Culture adequate volume   Culture   Final    NO GROWTH 5 DAYS Performed at [facility], [place] [address] [place], [place] [address]    Report Status 05/30/2018 FINAL  Final  Culture, blood (routine x 2)     Status: None (Preliminary result)   Collection Time: 05/27/18 11:54 AM  Result Value Ref Range Status   Specimen Description BLOOD LEFT ANTECUBITAL  Final   Special Requests   Final    BOTTLES DRAWN  AEROBIC ONLY Blood Culture results may not be optimal due to an inadequate volume of blood received in culture bottles   Culture   Final    NO GROWTH 3 DAYS Performed at [facility], [place] [address] [place], [place] [address]    Report Status PENDING  Incomplete  Culture, blood (routine x 2)     Status: None (Preliminary result)   Collection Time: 05/27/18 12:04 PM  Result Value Ref Range Status   Specimen Description BLOOD LEFT ANTECUBITAL  Final   Special Requests   Final    BOTTLES DRAWN AEROBIC ONLY Blood Culture results may not be optimal due to an  inadequate volume of blood received in culture bottles   Culture   Final    NO GROWTH 3 DAYS Performed at [facility], [place] [address] [place], [place] [address]    Report Status PENDING  Incomplete    RADIOLOGY STUDIES/RESULTS: Ct Head Wo Contrast  Result Date: 05/18/2018 CLINICAL DATA:  Altered level of consciousness. Patient following commands intermittently. EXAM: CT HEAD WITHOUT CONTRAST TECHNIQUE: Contiguous axial images were obtained from the base of the skull through the vertex without intravenous contrast. COMPARISON:  None. FINDINGS: Brain: No evidence of acute infarction, hemorrhage, hydrocephalus, extra-axial collection or mass lesion/mass effect. Vascular: No hyperdense vessel or unexpected calcification. Skull: Normal. Negative for fracture or focal lesion. Sinuses/Orbits: Globes and orbits are unremarkable. There is mild mucosal thickening lining the ethmoid air cells and left sphenoid sinus with minimal right sphenoid sinus and maxillary sinus mucosal thickening. Other: None. IMPRESSION: 1. No intracranial abnormalities. 2. Sinus mucosal thickening. Electronically Signed   By: Lajean Manes M.D.   On: 05/18/2018 14:29   Ct Chest W Contrast  Result Date: 05/28/2018 CLINICAL DATA:  Chest congestion and nonproductive cough. EXAM: CT CHEST WITH CONTRAST TECHNIQUE: Multidetector CT imaging of the chest was performed during intravenous contrast administration. CONTRAST:  71mL OMNIPAQUE IOHEXOL 300 MG/ML  SOLN COMPARISON:  Chest radiographs, 05/28/2018 and prior exams. FINDINGS: Cardiovascular: Heart is normal in size. No pericardial effusion. Great vessels are normal in caliber. No aortic atherosclerosis. Mediastinum/Nodes: Mild prominence of the right thyroid lobe with areas of decreased attenuation. Suspect multiple nodules, not well-defined by CT. No neck base or axillary masses or enlarged lymph nodes. No mediastinal or hilar masses or adenopathy. Trachea is unremarkable. Mild  distention of the distal esophagus versus a small hiatal hernia. There is a focus of increased attenuation within this, likely some ingested dense material. No esophageal mass or wall thickening. Lungs/Pleura: Trace right pleural effusion. Opacity in the right lower lobe, most evident in the central lower lobe. This may reflect atelectasis or infection. There is a small area of consolidation right upper lobe centered on image 49, series 5. Small amount of dependent opacity is noted in the right upper lobe against the minor fissure, likely atelectasis. Additional areas hazy ground-glass type opacity in both lungs, mostly in the upper lobes and right middle lobe. This may be due to infection/inflammation or air trapping from airways disease. Low lung volumes accentuates these findings. No lung mass or nodule. No evidence of pulmonary edema. No pneumothorax. Upper Abdomen: No acute abnormality. Musculoskeletal: No chest wall abnormality. No acute or significant osseous findings. IMPRESSION: 1. Consolidation in the right lower lobe, which may reflect atelectasis or pneumonia. Suspect pneumonia at least as a component. There is a small area of consolidation in the right upper lobe, consistent with an additional focus of pneumonia. Both lungs  show areas of ground-glass opacity which may reflect additional infection/inflammation or be due to airways disease and air trapping. 2. No evidence of pulmonary edema. 3. Minimal right pleural effusion. Electronically Signed   By: Lajean Manes M.D.   On: 05/28/2018 11:25   Ct Abdomen Pelvis W Contrast  Result Date: 05/24/2018 CLINICAL DATA:  Postop course prolonged and complicated by ileus-on TNA-due to worsening leukocytosis. Patient underwent exploratory laparotomy on 05/16/2018 small bowel resection. Concern for ileus versus small bowel obstruction or abscess. Leukocytosis. Question of anastomotic leak. EXAM: CT ABDOMEN AND PELVIS WITH CONTRAST TECHNIQUE: Multidetector CT  imaging of the abdomen and pelvis was performed using the standard protocol following bolus administration of intravenous contrast. CONTRAST:  135mL OMNIPAQUE IOHEXOL 300 MG/ML  SOLN COMPARISON:  CT of the abdomen and pelvis on 05/14/2018 FINDINGS: Lower chest: There is new consolidation at the RIGHT LOWER lobe. Subsegmental atelectasis is identified at the LEFT lung base. Hepatobiliary: Small cysts within the liver measures 1.1 centimeters. The gallbladder is present and contains faintly radiopaque calculus or calculi. Pancreas: Unremarkable. No pancreatic ductal dilatation or surrounding inflammatory changes. Spleen: Normal in size without focal abnormality. Adrenals/Urinary Tract: Adrenal glands are unremarkable. Kidneys are normal, without renal calculi, focal lesion, or hydronephrosis. Bladder is unremarkable. Stomach/Bowel: The stomach is normal in appearance. There has been interval resection of abnormal small bowel loop in the RIGHT LOWER QUADRANT. The anastomosis in the RIGHT LOWER QUADRANT appears unremarkable. There are persistent dilated loops of small bowel particularly in the central abdomen showing multiple caliber changes, suggestive of adhesions. Numerous air-fluid levels are identified in the central small bowel loops. Although there is fluid in the mesentery and RIGHT LOWER QUADRANT, there is no evidence for anastomotic leak of contrast. No free intraperitoneal air. There are scattered colonic diverticula but no evidence for acute diverticulitis. There is central mesenteric edema. Colon is normal in caliber. Vascular/Lymphatic: No significant vascular findings are present. No enlarged abdominal or pelvic lymph nodes. Reproductive: Uterus is enlarged and contains numerous low-attenuation lesions, suggestive of degenerating fibroids. No adnexal mass. Other: No free pelvic fluid. Mesenteric ascites. Edematous changes identified within the anterior abdominal wall. Postoperative changes in the anterior  abdomen. Musculoskeletal: No acute or significant osseous findings. IMPRESSION: 1. Interval resection of abnormal small bowel loop in the RIGHT LOWER QUADRANT. 2. Persistent dilated loops of small bowel in the central abdomen, suggestive of adhesions, and consistent with small bowel obstruction. 3. No evidence for anastomotic leak. 4. Mesenteric edema. 5. New consolidation at the RIGHT lung base. 6. Cholelithiasis. 7. Enlarged uterus containing numerous low-attenuation lesions, suggestive of degenerating fibroids. Electronically Signed   By: Nolon Nations M.D.   On: 05/24/2018 16:06   Dg Chest Port 1 View  Result Date: 05/28/2018 CLINICAL DATA:  58 year old female with shortness breath. Diabetes. Subsequent encounter. EXAM: PORTABLE CHEST 1 VIEW COMPARISON:  05/27/2018 chest x-ray. 05/24/2018 CT abdomen and pelvis. FINDINGS: Persistent elevated right hemidiaphragm. Right mid to lower lobe consolidation may represent atelectasis or infiltrate and appears similar to prior exam. Central pulmonary vascular congestion. Mild cardiomegaly. No pneumothorax detected. Right PICC line tip mid superior vena cava level. No acute osseous abnormality. IMPRESSION: 1. Persistent elevated right hemidiaphragm. Right mid to lower lobe consolidation may represent atelectasis or infiltrate and appears similar to prior exam. 2. Central pulmonary vascular congestion. 3. Mild cardiomegaly. Electronically Signed   By: Genia Del M.D.   On: 05/28/2018 08:01   Dg Chest Port 1 View  Result Date: 05/27/2018 CLINICAL DATA:  Shortness of breath and cough EXAM: PORTABLE CHEST 1 VIEW COMPARISON:  Yesterday FINDINGS: Streaky opacities at the bases with elevated right diaphragm. Cardiomegaly. No Kerley lines or pneumothorax. Unremarkable right upper extremity PICC position. IMPRESSION: No change in atelectatic opacities at the bases, more extensive on the right. Electronically Signed   By: Monte Fantasia M.D.   On: 05/27/2018 09:12    Dg Chest Port 1 View  Result Date: 05/17/2018 CLINICAL DATA:  Endotracheally intubated. EXAM: PORTABLE CHEST 1 VIEW COMPARISON:  05/15/2018 FINDINGS: Endotracheal tube terminates 2.5 cm above the carina. Enteric tube courses into the abdomen with tip not imaged and side hole below the diaphragm. Right jugular catheter terminates near the superior cavoatrial junction. The cardiomediastinal silhouette is unchanged with normal heart size. Mild bibasilar opacities have slightly improved. No sizable pleural effusion or pneumothorax is identified. IMPRESSION: Decreased, mild bibasilar atelectasis. Electronically Signed   By: Logan Bores M.D.   On: 05/17/2018 07:14   Dg Chest Port 1 View  Result Date: 05/15/2018 CLINICAL DATA:  Ventilator support EXAM: PORTABLE CHEST 1 VIEW COMPARISON:  05/14/2018 FINDINGS: Endotracheal tube tip is 2 cm above the carina. Nasogastric tube enters the stomach. Right internal jugular central line tip is in the proximal right atrium. No pneumothorax. Mild bilateral lower lobe atelectasis. IMPRESSION: Lines and tubes well positioned. Mild basilar atelectasis. Electronically Signed   By: Nelson Chimes M.D.   On: 05/15/2018 07:41   Dg Chest Portable 1 View  Result Date: 05/14/2018 CLINICAL DATA:  Abdominal pain for several days EXAM: PORTABLE CHEST 1 VIEW COMPARISON:  CT from earlier in the same day. FINDINGS: Elevation of the right hemidiaphragm is again seen. Cardiac shadow is within normal limits. Mild right basilar atelectatic changes are noted. No sizable effusion is seen. No bony abnormality is noted. IMPRESSION: Mild right basilar atelectasis. Electronically Signed   By: Inez Catalina M.D.   On: 05/14/2018 15:26   Dg Chest Port 1v Same Day  Result Date: 05/30/2018 CLINICAL DATA:  Shortness of breath, cough. EXAM: PORTABLE CHEST 1 VIEW COMPARISON:  Radiograph of May 14, 2018. CT scan of May 28, 2018. FINDINGS: Stable cardiomediastinal silhouette. Interval placement  of right-sided PICC line with distal tip in expected position of the SVC. No pneumothorax or pleural effusion is noted. No consolidative process is noted. Bony thorax is unremarkable. IMPRESSION: No acute cardiopulmonary abnormality seen. Interval placement of right-sided PICC line as described above. Electronically Signed   By: Marijo Conception, M.D.   On: 05/30/2018 11:57   Dg Chest Port 1v Same Day  Result Date: 05/27/2018 CLINICAL DATA:  Shortness of breath, history diabetes mellitus, GERD, hypertension EXAM: PORTABLE CHEST 1 VIEW COMPARISON:  Portable exam 1624 hours compared to 05/27/2018 FINDINGS: Normal heart size, mediastinal contours, and pulmonary vascularity. RIGHT arm PICC line tip projects over SVC. Chronic elevation of RIGHT diaphragm with subsegmental atelectasis at RIGHT base. Central peribronchial thickening. No infiltrate, pleural effusion or pneumothorax. IMPRESSION: Bronchitic changes with persistent RIGHT basilar atelectasis. Electronically Signed   By: Lavonia Dana M.D.   On: 05/27/2018 16:59   Dg Chest Port 1v Same Day  Result Date: 05/26/2018 CLINICAL DATA:  58 year old female with shortness of breath. Recent abdominal surgery. EXAM: PORTABLE CHEST 1 VIEW COMPARISON:  Prior chest x-ray 05/17/2018 FINDINGS: The patient has been extubated and the nasogastric tube removed. Additionally, the right IJ central venous catheter has been removed. A right upper extremity PICC has been placed. The catheter tip overlies the distal SVC. Dense  band of linear airspace opacity in the right lung obscuring the right cardiac margin favored to represent right middle lobar collapse/atelectasis. Additionally, there is linear atelectasis in the left lower lobe. No evidence of pulmonary edema or pneumothorax. No acute osseous abnormality. IMPRESSION: 1. Multifocal atelectasis including right middle lobe collapse. This may be secondary to mucous plugging. 2. Well-positioned right upper extremity approach PICC.  Electronically Signed   By: Jacqulynn Cadet M.D.   On: 05/26/2018 10:19   Dg Abd Portable 1v  Result Date: 05/26/2018 CLINICAL DATA:  58 year old female with abdominal distension and shortness of breath EXAM: PORTABLE ABDOMEN - 1 VIEW COMPARISON:  CT abdomen/pelvis 05/24/2018 FINDINGS: Persistent diffuse gaseous distention of multiple loops of small bowel throughout the abdomen. No massive free air visualized on this supine radiograph. Lower lumbar degenerative disc disease again noted. IMPRESSION: Persistent gaseous distention of multiple loops of small bowel throughout the abdomen most consistent with ileus. Electronically Signed   By: Jacqulynn Cadet M.D.   On: 05/26/2018 10:18   Dg Abd Portable 1v  Result Date: 05/24/2018 CLINICAL DATA:  Initial evaluation for acute vomiting, small bowel obstruction. EXAM: PORTABLE ABDOMEN - 1 VIEW COMPARISON:  Prior CT from 05/14/2018. FINDINGS: Multiple dilated gas-filled loops of small bowel seen within the left and mid abdomen, compatible with ongoing small bowel obstruction. Loops dilated up to approximately 4 cm in diameter. Paucity of gas distally. No soft tissue mass or abnormal calcification. No appreciable free air on these limited supine views of the abdomen. IMPRESSION: Multiple persistent gas-filled dilated loops of small bowel within the left and mid abdomen, compatible with ongoing small bowel obstruction. Electronically Signed   By: Jeannine Boga M.D.   On: 05/24/2018 06:00   Ct Renal Stone Study  Result Date: 05/14/2018 CLINICAL DATA:  58 y/o F; nausea, vomiting, and abdominal pain for 3 days. EXAM: CT ABDOMEN AND PELVIS WITHOUT CONTRAST TECHNIQUE: Multidetector CT imaging of the abdomen and pelvis was performed following the standard protocol without IV contrast. COMPARISON:  None. FINDINGS: Lower chest: Platelike atelectasis in the lung bases. Hepatobiliary: No focal liver abnormality is seen. No gallstones, gallbladder wall thickening, or  biliary dilatation. Pancreas: Unremarkable. No pancreatic ductal dilatation or surrounding inflammatory changes. Spleen: Normal in size without focal abnormality. Adrenals/Urinary Tract: Adrenal glands are unremarkable. Kidneys are normal, without renal calculi, focal lesion, or hydronephrosis. Bladder is unremarkable. Stomach/Bowel: Severe diffuse small-bowel obstruction with transition at the level of the distal ileum in the right lower quadrant (series 3, image 65). Mild sigmoid diverticulosis without findings of acute diverticulitis. Otherwise normal appearance of the colon. Vascular/Lymphatic: Aortic atherosclerosis. No enlarged abdominal or pelvic lymph nodes. Reproductive: Negative. Other: There are a few tiny foci of pneumoperitoneum at the liver hilum, anterior abdominal wall, and below the diaphragm (series 3, image 12, 24, 27). Small volume of ascites. Musculoskeletal: No fracture is seen. Lumbar spondylosis with prominent facet arthropathy. IMPRESSION: 1. Severe diffuse small bowel obstruction with transition level of distal ileum in right lower quadrant. 2. Few punctate foci of pneumoperitoneum indicating small/micro perforation. Small volume of ascites. These results were called by telephone at the time of interpretation on 05/14/2018 at 3:27 pm to Dr. Andy Gauss, who verbally acknowledged these results. Electronically Signed   By: Kristine Garbe M.D.   On: 05/14/2018 15:34   [place] Ekg Site Rite  Result Date: 05/23/2018 If Site Rite image not attached, placement could not be confirmed due to current cardiac rhythm.    LOS: 16 days  Oren Binet, MD  [facility]  If 7PM-7AM, please contact night-coverage  Please page via [email or web address]-click on MD name and type text message  05/30/2018, 2:25 PM

## 2018-05-30 NOTE — Progress Notes (Signed)
Physical Therapy Treatment Patient Details Name: Wendy [name] MRN: [id number] DOB: 12/07/1960 Today's Date: 05/30/2018    History of Present Illness 58 year old female who presented with 3-day history of abdominal pain, emesis, anorexia, and polyuria for the past 3 days.  Labs notable hyperglycemia 771 with anion gap of 21.  CT abdomen pelvis showed small bowel obstruction with pneumoperitoneum and she underwent ex lap with small bowel resection.    PT Comments    Pt progressing well with mobility. She required min/mod assist bed mobility, supervision sit to stand, and min guard assist ambulation 55 feet with RW x 2 trials, with seated rest break between trials. SpO2 93% on RA. Pt returned to supine in bed at end of session.   Follow Up Recommendations  CIR     Equipment Recommendations  Other (comment)(TBA)    Recommendations for Other Services       Precautions / Restrictions Precautions Precautions: Fall;Other (comment) Precaution Comments: wound vac Restrictions Weight Bearing Restrictions: No    Mobility  Bed Mobility Overal bed mobility: Needs Assistance Bed Mobility: Sit to Supine;Supine to Sit     Supine to sit: Min assist;HOB elevated Sit to supine: Mod assist   General bed mobility comments: Required assistance to elevate trunk and lift B LEs back to bed. +rail  Transfers Overall transfer level: Needs assistance Equipment used: Rolling walker (2 wheeled) Transfers: Sit to/from [facility] Sit to Stand: Supervision Stand pivot transfers: Min guard       General transfer comment: cues for hand placement and sequencing  Ambulation/Gait Ambulation/Gait assistance: Min guard;+2 safety/equipment Gait Distance (Feet): 55 Feet(x 2) Assistive device: Rolling walker (2 wheeled) Gait Pattern/deviations: Step-through pattern;Decreased stride length;Trunk flexed Gait velocity: decreased Gait velocity interpretation: <1.31 ft/sec, indicative of  household ambulator General Gait Details: cues for posture   Stairs             Wheelchair Mobility    Modified Rankin (Stroke Patients Only)       Balance Overall balance assessment: Needs assistance Sitting-balance support: Feet supported;No upper extremity supported Sitting balance-Leahy Scale: Fair     Standing balance support: Bilateral upper extremity supported;During functional activity Standing balance-Leahy Scale: Poor Standing balance comment: reliant on RW                            Cognition Arousal/Alertness: Awake/alert Behavior During Therapy: WFL for tasks assessed/performed Overall Cognitive Status: Within Functional Limits for tasks assessed                                 General Comments: speech content appropriate. some slow processing.      Exercises      General Comments General comments (skin integrity, edema, etc.): SpO2 93% on RA during ambulaiton      Pertinent Vitals/Pain Pain Assessment: 0-10 Pain Score: 5  Pain Location: abdomen Pain Descriptors / Indicators: Guarding;Grimacing;Sore Pain Intervention(s): Limited activity within patient's tolerance;Monitored during session;Repositioned    Home Living                      Prior Function            PT Goals (current goals can now be found in the care plan section) Acute Rehab PT Goals Patient Stated Goal: Return to independence PT Goal Formulation: With patient/family Time For Goal Achievement: 06/04/18 Potential to Achieve Goals:  Good Progress towards PT goals: Progressing toward goals    Frequency    Min 3X/week      PT Plan Current plan remains appropriate    Co-evaluation              AM-PAC PT "6 Clicks" Mobility   Outcome Measure  Help needed turning from your back to your side while in a flat bed without using bedrails?: A Lot Help needed moving from lying on your back to sitting on the side of a flat bed  without using bedrails?: A Lot Help needed moving to and from a bed to a chair (including a wheelchair)?: A Little Help needed standing up from a chair using your arms (e.g., wheelchair or bedside chair)?: A Little Help needed to walk in hospital room?: A Little Help needed climbing 3-5 steps with a railing? : A Little 6 Click Score: 16    End of Session Equipment Utilized During Treatment: Gait belt Activity Tolerance: Patient tolerated treatment well Patient left: in bed;with call bell/phone within reach;with bed alarm set Nurse Communication: Mobility status PT Visit Diagnosis: Muscle weakness (generalized) (M62.81);Difficulty in walking, not elsewhere classified (R26.2);Pain     Time: 3818-2993 PT Time Calculation (min) (ACUTE ONLY): 31 min  Charges:  $Gait Training: 23-37 mins                     Lorrin Goodell, [place]  Office # [phone] Pager [phone]    Lorriane Shire 05/30/2018, 12:35 PM

## 2018-05-30 NOTE — Progress Notes (Signed)
[place] NOTE   Pharmacy Consult for TPN Indication: ileus  Patient Measurements: Height: 5\' 3"  (160 cm) Weight: 262 lb 5.6 oz (119 kg) IBW/kg (Calculated) : 52.4 TPN AdjBW (KG): 68.8 Body mass index is 46.47 kg/m. Usual Weight: 117.9 kg  Assessment:  58 y/o female presenting to the ED with 3 days of vomiting and abdominal pain found to be in DKA. CT showed SBO. Noted SBO, pneumoperitoneum, intussusception with necrosis and performation and was taken to the OR emergently for ex lap, SBR left in discontinuity, and abthera VAC on 1/27. Taken back to the OR on 1/29 for ex lap with SB anastomosis and fascia closure. High risk for refeeding.  Husband reports patient eating well prior to experiencing GI distress.  GI: SBO, pneumoperitoneum, intussusception with necrosis and perforation. Patient reporting nausea even with water. Albumin 2.1>2, prealbumin improving 10.1>14.4>17.8, NG removed 2/5. +flatus and  LBM 2/11. PPI IV for GERD. Po Intake 330ml soft diet. Drains 94ml. C/o abdominal pain and loose BM. C/o food taste.  - CT scan 2/6 showing ileus vs SBO, anastomosis intact, no abscess Ileus improving - continuing CLD at this point  Endo: hx DM, CBGs 111-148 Insulin requirements in the past 24 hours: 9 units SSI, 60 units in TPN, and 24 units of Lantus  Lytes: K 3.7 (goal >= 4), Mag 2.2 (goal >=2) Phos 1.9 > 2.9 Renal: AKI resolved, SCr stable Pulm: RA (Flonase, duonebs) Cards: h/o HLD, HTN, HR 110 this AM Hepatobil: LFT and T bili WNL, TG 139 Neuro: Effexor. Can be uncooperative. VO:HKGO 101.6, Tc 100.4, WBC 5.9 down. Merrem 1/27: Clostridium [place] 1/27: Peritoneal fluid: Enterococcus faecium, Clostridium  TPN Access: double lumen CVC placed 05/14/18 TPN start date: 05/19/18  Nutritional Goals (per RD recommendation on 05/19/18): KCal: 2100-2300 Protein: 116-128 g Fluid: >1.6L  Current Nutrition:  TPN CLD  Plan:   Diet advanced  to soft  Continue TPN at 15ml/hr. No change tonight - This TPN provides 127 g of protein, 302 g of dextrose, and 58 g of lipids which provides 2,120 kCals per day, meeting 100% of patient needs  Electrolytes in TPN: continue same as previous   Continue MVI, trace elements in TPN  Continue insulin 60 units in TPN Continue resistant SSI Q4h and Lantus 24 units   Monitor TPN labs Mon/Thurs   Galileah Piggee S. Alford Highland, PharmD, BCPS Clinical Staff Pharmacist 05/30/2018 8:58 AM

## 2018-05-30 NOTE — Progress Notes (Signed)
[facility] Progress Note  14 Days Post-Op  Subjective: CC-  Continues to complain of abdominal pain. Denies n/v today. States that she only drank water yesterday because even this makes her abdomen hurt. States that everything is too sweet. States that everything she drinks goes straight through her. She had at least 6 loose/watery BMs yesterday.  Objective: Vital signs in last 24 hours: Temp:  [98.8 F (37.1 C)-101.6 F (38.7 C)] 100.4 F (38 C) (02/12 0757) Pulse Rate:  [101-110] 110 (02/12 0253) Resp:  [24] 24 (02/11 1956) BP: (114-136)/(67-86) 135/78 (02/12 0757) SpO2:  [96 %] 96 % (02/12 0253) Weight:  [324 kg-120 kg] 119 kg (02/12 0550) Last BM Date: 05/29/18  Intake/Output from previous day: 02/11 0701 - 02/12 0700 In: 3064 [P.O.:300; I.V.:2014; IV Piggyback:750] Out: 2350 [Urine:2300; Drains:50] Intake/Output this shift: No intake/output data recorded.  PE: Gen:  Alert, NAD, pleasant Card: tachy Pulm:  Diffuse rhonchi, effort normal Abd: Soft, mild distension, nontender, +BS, vac to midline wound Skin: warm and dry  Lab Results:  Recent Labs    05/29/18 1207 05/30/18 0342  WBC 6.3 5.9  HGB 8.1* 7.8*  HCT 26.5* 24.7*  PLT 284 271   BMET Recent Labs    05/29/18 1207 05/30/18 0342  NA 139 141  K 3.6 3.7  CL 108 111  CO2 23 26  GLUCOSE 156* 130*  BUN 15 15  CREATININE 0.75 0.75  CALCIUM 7.8* 7.6*   PT/INR No results for input(s): LABPROT, INR in the last 72 hours. CMP     Component Value Date/Time   NA 141 05/30/2018 0342   NA 144 12/25/2014 1536   K 3.7 05/30/2018 0342   CL 111 05/30/2018 0342   CO2 26 05/30/2018 0342   GLUCOSE 130 (H) 05/30/2018 0342   BUN 15 05/30/2018 0342   BUN 9 12/25/2014 1536   CREATININE 0.75 05/30/2018 0342   CREATININE 0.69 05/25/2017 1150   CALCIUM 7.6 (L) 05/30/2018 0342   PROT 5.7 (L) 05/28/2018 0449   PROT 6.7 12/25/2014 1536   ALBUMIN 2.0 (L) 05/28/2018 0449   ALBUMIN 4.4 12/25/2014 1536    AST 36 05/28/2018 0449   ALT 28 05/28/2018 0449   ALKPHOS 78 05/28/2018 0449   BILITOT 0.2 (L) 05/28/2018 0449   BILITOT 0.3 12/25/2014 1536   GFRNONAA >60 05/30/2018 0342   GFRNONAA 78 08/25/2016 1100   GFRAA >60 05/30/2018 0342   GFRAA >89 08/25/2016 1100   Lipase     Component Value Date/Time   LIPASE 37 05/14/2018 2014       Studies/Results: Ct Chest W Contrast  Result Date: 05/28/2018 CLINICAL DATA:  Chest congestion and nonproductive cough. EXAM: CT CHEST WITH CONTRAST TECHNIQUE: Multidetector CT imaging of the chest was performed during intravenous contrast administration. CONTRAST:  53mL OMNIPAQUE IOHEXOL 300 MG/ML  SOLN COMPARISON:  Chest radiographs, 05/28/2018 and prior exams. FINDINGS: Cardiovascular: Heart is normal in size. No pericardial effusion. Great vessels are normal in caliber. No aortic atherosclerosis. Mediastinum/Nodes: Mild prominence of the right thyroid lobe with areas of decreased attenuation. Suspect multiple nodules, not well-defined by CT. No neck base or axillary masses or enlarged lymph nodes. No mediastinal or hilar masses or adenopathy. Trachea is unremarkable. Mild distention of the distal esophagus versus a small hiatal hernia. There is a focus of increased attenuation within this, likely some ingested dense material. No esophageal mass or wall thickening. Lungs/Pleura: Trace right pleural effusion. Opacity in the right lower lobe, most evident in  the central lower lobe. This may reflect atelectasis or infection. There is a small area of consolidation right upper lobe centered on image 49, series 5. Small amount of dependent opacity is noted in the right upper lobe against the minor fissure, likely atelectasis. Additional areas hazy ground-glass type opacity in both lungs, mostly in the upper lobes and right middle lobe. This may be due to infection/inflammation or air trapping from airways disease. Low lung volumes accentuates these findings. No lung  mass or nodule. No evidence of pulmonary edema. No pneumothorax. Upper Abdomen: No acute abnormality. Musculoskeletal: No chest wall abnormality. No acute or significant osseous findings. IMPRESSION: 1. Consolidation in the right lower lobe, which may reflect atelectasis or pneumonia. Suspect pneumonia at least as a component. There is a small area of consolidation in the right upper lobe, consistent with an additional focus of pneumonia. Both lungs show areas of ground-glass opacity which may reflect additional infection/inflammation or be due to airways disease and air trapping. 2. No evidence of pulmonary edema. 3. Minimal right pleural effusion. Electronically Signed   By: Lajean Manes M.D.   On: 05/28/2018 11:25    Anti-infectives: Anti-infectives (From admission, onward)   Start     Dose/Rate Route Frequency Ordered Stop   05/28/18 0500  vancomycin (VANCOCIN) 1,250 mg in sodium chloride 0.9 % 250 mL IVPB     1,250 mg 166.7 mL/hr over 90 Minutes Intravenous Every 12 hours 05/27/18 1612     05/27/18 1700  vancomycin (VANCOCIN) 2,500 mg in sodium chloride 0.9 % 500 mL IVPB     2,500 mg 250 mL/hr over 120 Minutes Intravenous  Once 05/27/18 1612 05/27/18 1922   05/27/18 1600  meropenem (MERREM) 2 g in sodium chloride 0.9 % 100 mL IVPB     2 g 200 mL/hr over 30 Minutes Intravenous Every 8 hours 05/27/18 1544 06/04/18 0759   05/26/18 1300  Ampicillin-Sulbactam (UNASYN) 3 g in sodium chloride 0.9 % 100 mL IVPB  Status:  Discontinued     3 g 200 mL/hr over 30 Minutes Intravenous Every 6 hours 05/26/18 1234 05/27/18 1538   05/19/18 1400  vancomycin (VANCOCIN) 1,250 mg in sodium chloride 0.9 % 250 mL IVPB  Status:  Discontinued     1,250 mg 166.7 mL/hr over 90 Minutes Intravenous Every 24 hours 05/18/18 1343 05/19/18 1244   05/19/18 1400  meropenem (MERREM) 2 g in sodium chloride 0.9 % 100 mL IVPB     2 g 200 mL/hr over 30 Minutes Intravenous Every 8 hours 05/19/18 1244 05/26/18 2359   05/18/18  1600  cefTRIAXone (ROCEPHIN) 2 g in sodium chloride 0.9 % 100 mL IVPB  Status:  Discontinued     2 g 200 mL/hr over 30 Minutes Intravenous Every 24 hours 05/18/18 1326 05/19/18 1244   05/18/18 1400  vancomycin (VANCOCIN) 2,000 mg in sodium chloride 0.9 % 500 mL IVPB     2,000 mg 250 mL/hr over 120 Minutes Intravenous  Once 05/18/18 1343 05/18/18 1640   05/15/18 2333  ceFEPIme (MAXIPIME) 2 g in sodium chloride 0.9 % 100 mL IVPB  Status:  Discontinued     2 g 200 mL/hr over 30 Minutes Intravenous Every 8 hours 05/15/18 2226 05/18/18 1326   05/15/18 1500  ceFEPIme (MAXIPIME) 1 g in sodium chloride 0.9 % 100 mL IVPB  Status:  Discontinued     1 g 200 mL/hr over 30 Minutes Intravenous Every 24 hours 05/14/18 1512 05/15/18 2226   05/15/18 0000  anidulafungin (  ERAXIS) 100 mg in sodium chloride 0.9 % 100 mL IVPB  Status:  Discontinued     100 mg 78 mL/hr over 100 Minutes Intravenous Every 24 hours 05/14/18 1728 05/15/18 1159   05/14/18 1800  anidulafungin (ERAXIS) 200 mg in sodium chloride 0.9 % 200 mL IVPB     200 mg 78 mL/hr over 200 Minutes Intravenous  Once 05/14/18 1751 05/14/18 2138   05/14/18 1745  vancomycin (VANCOCIN) 2,000 mg in sodium chloride 0.9 % 500 mL IVPB  Status:  Discontinued     2,000 mg 250 mL/hr over 120 Minutes Intravenous  Once 05/14/18 1741 05/15/18 1159   05/14/18 1741  vancomycin variable dose per unstable renal function (pharmacist dosing)  Status:  Discontinued      Does not apply See admin instructions 05/14/18 1741 05/15/18 1159   05/14/18 1515  ceFEPIme (MAXIPIME) 2 g in sodium chloride 0.9 % 100 mL IVPB     2 g 200 mL/hr over 30 Minutes Intravenous  Once 05/14/18 1512 05/14/18 1649   05/14/18 1515  metroNIDAZOLE (FLAGYL) IVPB 500 mg  Status:  Discontinued     500 mg 100 mL/hr over 60 Minutes Intravenous Every 8 hours 05/14/18 1512 05/19/18 1244       Assessment/Plan DKA- per medicine, resolved Sepsis- secondary to below intra-abdominal problem Right-side  PNA- only pulls 750 at best on IS, per medicine  SBO,Pneumoperitoneum, intussusception with necrosis & perforation - S/P ex lap, small bowel resection, closure with abthera vac, Dr. Grandville Silos, 01/17 -S/PExploratorylaparotomy with small bowel anastomosis and closure of abdominal fascia, Dr. Brantley Stage, 01/29POD 14 - NGTcame out 02/05 -CT scan2/6showing ileus vs SBO, anastomosis intact, no abscess -ileus-improving, having bowel function but not eating - needs to mobilize more, continue therapies - IS - wound vac changes MWF  FEN: soft diet, TPN VTE: SCD's, startedlovenox 01/30 VV:OHYW/VPXTGG for HCAP Foley:DC02/05 Follow up:Dr. Grandville Silos  Plan - Check c diff. Advance to soft diet, maybe she will find something she likes to eat with this. Continue TPN until reliably taking in enough PO. Please page me during vac change today.   LOS: 16 days    Wendy [name] , [facility] 05/30/2018, 8:33 AM Pager: [phone] Mon-Thurs 7:00 am-4:30 pm Fri 7:00 am -11:30 AM Sat-Sun 7:00 am-11:30 am

## 2018-05-31 LAB — COMPREHENSIVE METABOLIC PANEL
ALT: 73 U/L — AB (ref 0–44)
AST: 94 U/L — ABNORMAL HIGH (ref 15–41)
Albumin: 2 g/dL — ABNORMAL LOW (ref 3.5–5.0)
Alkaline Phosphatase: 90 U/L (ref 38–126)
Anion gap: 7 (ref 5–15)
BUN: 15 mg/dL (ref 6–20)
CO2: 24 mmol/L (ref 22–32)
CREATININE: 0.8 mg/dL (ref 0.44–1.00)
Calcium: 7.8 mg/dL — ABNORMAL LOW (ref 8.9–10.3)
Chloride: 109 mmol/L (ref 98–111)
GFR calc Af Amer: >60 mL/min (ref >60)
Glucose, Bld: 137 mg/dL — ABNORMAL HIGH (ref 70–99)
Potassium: 3.6 mmol/L (ref 3.5–5.1)
Sodium: 140 mmol/L (ref 135–145)
TOTAL PROTEIN: 5.8 g/dL — AB (ref 6.5–8.1)
Total Bilirubin: 0.1 mg/dL — ABNORMAL LOW (ref 0.3–1.2)

## 2018-05-31 LAB — GLUCOSE, CAPILLARY
GLUCOSE-CAPILLARY: 125 mg/dL — AB (ref 70–99)
GLUCOSE-CAPILLARY: 140 mg/dL — AB (ref 70–99)
Glucose-Capillary: 125 mg/dL — ABNORMAL HIGH (ref 70–99)
Glucose-Capillary: 130 mg/dL — ABNORMAL HIGH (ref 70–99)
Glucose-Capillary: 134 mg/dL — ABNORMAL HIGH (ref 70–99)
Glucose-Capillary: 148 mg/dL — ABNORMAL HIGH (ref 70–99)
Glucose-Capillary: 94 mg/dL (ref 70–99)

## 2018-05-31 LAB — PHOSPHORUS: Phosphorus: 2.6 mg/dL (ref 2.5–4.6)

## 2018-05-31 LAB — C DIFFICILE QUICK SCREEN W PCR REFLEX
C Diff antigen: NEGATIVE
C Diff interpretation: NOT DETECTED
C Diff toxin: NEGATIVE

## 2018-05-31 LAB — MAGNESIUM: Magnesium: 2.2 mg/dL (ref 1.7–2.4)

## 2018-05-31 MED ORDER — TRAVASOL 10 % IV SOLN
INTRAVENOUS | Status: AC
  Administered 2018-05-31: 17:00:00 via INTRAVENOUS
  Filled 2018-05-31: qty 637.2

## 2018-05-31 MED ORDER — FUROSEMIDE 10 MG/ML IJ SOLN
20.0000 mg | Freq: Once | INTRAMUSCULAR | Status: AC
  Administered 2018-05-31: 20 mg via INTRAVENOUS
  Filled 2018-05-31: qty 2

## 2018-05-31 MED ORDER — PANTOPRAZOLE SODIUM 40 MG PO TBEC
40.0000 mg | DELAYED_RELEASE_TABLET | Freq: Every day | ORAL | Status: DC
  Administered 2018-06-01 – 2018-06-04 (×4): 40 mg via ORAL
  Filled 2018-05-31 (×4): qty 1

## 2018-05-31 MED ORDER — BOOST / RESOURCE BREEZE PO LIQD CUSTOM
1.0000 | Freq: Three times a day (TID) | ORAL | Status: DC
  Administered 2018-05-31 – 2018-06-01 (×4): 1 via ORAL

## 2018-05-31 MED ORDER — TRAVASOL 10 % IV SOLN
INTRAVENOUS | Status: AC
  Filled 2018-05-31: qty 637.2

## 2018-05-31 NOTE — Progress Notes (Signed)
PROGRESS NOTE        PATIENT DETAILS Name: Wendy [name] Age: 58 y.o. Sex: female Date of Birth: 10-May-1960 Admit Date: 05/14/2018 Admitting Physician Rush Farmer, MD ZDG:LOVFIE, Drue Stager, MD  Brief Narrative: Patient is a 58 y.o. female with history of DM-2, pretension, GERD, morbid obesity who presented with a 3-day history of vomiting and abdominal pain-found to have sepsis secondary to small bowel obstruction and small perforation, along with DKA.  She was started on IV insulin, empiric antimicrobial therapy-General surgery was consulted-and patient was admitted to the ICU under the PCCM service.  Hospital course lately has been complicated by development of ileus, and pneumonia.  Significant Events: 1/27 Admit > OR for ex lap with small bowel resection> ICU 1/29 OR for anastomosis and closure 2/1 Extubated 2/2 Hypoactive delirium, worsening respiratory status 2/8>>Right PNA  Subjective: Apparently had 4-5 (per patient) loose stools overnight.  Febrile last night x1.  Assessment/Plan: Acute hypoxic respiratory failure: Extubated on 2/1 (intubated for surgery) continue to wean to RA as able.  Septic shock secondary to peritonitis with bowel obstruction along with Clostridium bacteremia: Sepsis pathophysiology has resolved, blood cultures positive for Clostridium species.  Peritoneal fluid cultures positive for enterococcus and Clostridium as well.  Has completed a course of meropenem till 2/8.   Small bowel obstruction with perforation s/p resection and closure of the abdominal fascia with prolonged postop course due to ileus: Slowly improving-tolerating soft diet-continue TNA till oral intake is more reliable.  General surgery following.    Fever secondary to right-sided pneumonia: Pneumonia is secondaryto poor respiratory effort/cough effort-and refusal to be mobilized/ambulated.  Per nursing staff-has refused chest PT.  Sounds congested but  better-afebrile-chest x-ray on 2/12 with no infiltrates.  Since has completed almost 6 days of IV antimicrobial therapy-we will stop Unasyn and monitor.  She also has had some diarrhea overnight--given fever-and improvement in pneumonia symptoms-prudent to check for C. difficile given prolonged hospital stay and numerous antimicrobial therapy usage.  Anemia: Secondary to acute/critical illness-no evidence of blood loss.  Hemoglobin continues to be low but stable.  Transfuse if significant drop in hemoglobin.  Hypernatremia: Resolved  Type II DM with uncontrolled hyperglycemia: Had severe hyperglycemia with probable DKA on admission-CBGs currently stable with 20 points of Lantus and SSI.  Continue to follow.    Hypertension: Blood pressure relatively well controlled-continue to monitor off antihypertensives-once oral intake has resumed we can resume her blood pressure medications.    GERD: Continue PPI  Dyslipidemia: Resume statin when oral intake is further stabilized  Debility/deconditioning: Secondary to acute illness/critical illness-CIR when more stable over the next few days.  DVT Prophylaxis: Prophylactic Lovenox   Code Status: Full code   Family Communication: None at bedside  Disposition Plan: Remain inpatient-suspect CIR on discharge  Antimicrobial agents: Anti-infectives (From admission, onward)   Start     Dose/Rate Route Frequency Ordered Stop   05/30/18 1830  Ampicillin-Sulbactam (UNASYN) 3 g in sodium chloride 0.9 % 100 mL IVPB  Status:  Discontinued     3 g 200 mL/hr over 30 Minutes Intravenous Every 6 hours 05/30/18 1810 05/31/18 0823   05/30/18 1530  amoxicillin-clavulanate (AUGMENTIN) 875-125 MG per tablet 1 tablet  Status:  Discontinued     1 tablet Oral Every 12 hours 05/30/18 1447 05/30/18 1810   05/28/18 0500  vancomycin (VANCOCIN) 1,250 mg in sodium  chloride 0.9 % 250 mL IVPB  Status:  Discontinued     1,250 mg 166.7 mL/hr over 90 Minutes Intravenous Every  12 hours 05/27/18 1612 05/30/18 0936   05/27/18 1700  vancomycin (VANCOCIN) 2,500 mg in sodium chloride 0.9 % 500 mL IVPB     2,500 mg 250 mL/hr over 120 Minutes Intravenous  Once 05/27/18 1612 05/27/18 1922   05/27/18 1600  meropenem (MERREM) 2 g in sodium chloride 0.9 % 100 mL IVPB  Status:  Discontinued     2 g 200 mL/hr over 30 Minutes Intravenous Every 8 hours 05/27/18 1544 05/30/18 1442   05/26/18 1300  Ampicillin-Sulbactam (UNASYN) 3 g in sodium chloride 0.9 % 100 mL IVPB  Status:  Discontinued     3 g 200 mL/hr over 30 Minutes Intravenous Every 6 hours 05/26/18 1234 05/27/18 1538   05/19/18 1400  vancomycin (VANCOCIN) 1,250 mg in sodium chloride 0.9 % 250 mL IVPB  Status:  Discontinued     1,250 mg 166.7 mL/hr over 90 Minutes Intravenous Every 24 hours 05/18/18 1343 05/19/18 1244   05/19/18 1400  meropenem (MERREM) 2 g in sodium chloride 0.9 % 100 mL IVPB     2 g 200 mL/hr over 30 Minutes Intravenous Every 8 hours 05/19/18 1244 05/26/18 2359   05/18/18 1600  cefTRIAXone (ROCEPHIN) 2 g in sodium chloride 0.9 % 100 mL IVPB  Status:  Discontinued     2 g 200 mL/hr over 30 Minutes Intravenous Every 24 hours 05/18/18 1326 05/19/18 1244   05/18/18 1400  vancomycin (VANCOCIN) 2,000 mg in sodium chloride 0.9 % 500 mL IVPB     2,000 mg 250 mL/hr over 120 Minutes Intravenous  Once 05/18/18 1343 05/18/18 1640   05/15/18 2333  ceFEPIme (MAXIPIME) 2 g in sodium chloride 0.9 % 100 mL IVPB  Status:  Discontinued     2 g 200 mL/hr over 30 Minutes Intravenous Every 8 hours 05/15/18 2226 05/18/18 1326   05/15/18 1500  ceFEPIme (MAXIPIME) 1 g in sodium chloride 0.9 % 100 mL IVPB  Status:  Discontinued     1 g 200 mL/hr over 30 Minutes Intravenous Every 24 hours 05/14/18 1512 05/15/18 2226   05/15/18 0000  anidulafungin (ERAXIS) 100 mg in sodium chloride 0.9 % 100 mL IVPB  Status:  Discontinued     100 mg 78 mL/hr over 100 Minutes Intravenous Every 24 hours 05/14/18 1728 05/15/18 1159   05/14/18  1800  anidulafungin (ERAXIS) 200 mg in sodium chloride 0.9 % 200 mL IVPB     200 mg 78 mL/hr over 200 Minutes Intravenous  Once 05/14/18 1751 05/14/18 2138   05/14/18 1745  vancomycin (VANCOCIN) 2,000 mg in sodium chloride 0.9 % 500 mL IVPB  Status:  Discontinued     2,000 mg 250 mL/hr over 120 Minutes Intravenous  Once 05/14/18 1741 05/15/18 1159   05/14/18 1741  vancomycin variable dose per unstable renal function (pharmacist dosing)  Status:  Discontinued      Does not apply See admin instructions 05/14/18 1741 05/15/18 1159   05/14/18 1515  ceFEPIme (MAXIPIME) 2 g in sodium chloride 0.9 % 100 mL IVPB     2 g 200 mL/hr over 30 Minutes Intravenous  Once 05/14/18 1512 05/14/18 1649   05/14/18 1515  metroNIDAZOLE (FLAGYL) IVPB 500 mg  Status:  Discontinued     500 mg 100 mL/hr over 60 Minutes Intravenous Every 8 hours 05/14/18 1512 05/19/18 1244      Procedures: 2/6>>PICC line  1/29>> exploratory laparotomy with small bowel anastomosis and closure of abdominal fascia 1/27>>Right IJ (Anesthesia) 1/27>>EXPLORATORY LAPAROTOMY,SMALL BOWEL RESECTION,CLOSURE WITH ABTHERA VAC  CONSULTS:  pulmonary/intensive care and general surgery  Time spent: 25- minutes-Greater than 50% of this time was spent in counseling, explanation of diagnosis, planning of further management, and coordination of care.  MEDICATIONS: Scheduled Meds: . chlorhexidine  15 mL Mouth Rinse BID  . Chlorhexidine Gluconate Cloth  6 each Topical Daily  . enoxaparin (LOVENOX) injection  40 mg Subcutaneous Q24H  . fluticasone  2 spray Each Nare Daily  . guaiFENesin  600 mg Oral BID  . insulin aspart  0-20 Units Subcutaneous Q4H  . insulin glargine  24 Units Subcutaneous Daily  . ipratropium-albuterol  3 mL Nebulization TID  . mouth rinse  15 mL Mouth Rinse BID  . [START ON 06/01/2018] pantoprazole  40 mg Oral Daily  . sodium chloride flush  10-40 mL Intracatheter Q12H  . venlafaxine XR  150 mg Oral Daily   Continuous  Infusions: . dextrose 10 mL/hr at 05/25/18 1455  . lactated ringers 50 mL/hr at 05/29/18 0700  . TPN ADULT (ION)    . TPN ADULT (ION) 45 mL/hr at 05/31/18 0840   PRN Meds:.acetaminophen, alum & mag hydroxide-simeth, fentaNYL (SUBLIMAZE) injection, ibuprofen, iopamidol, lip balm, sodium chloride flush, sodium chloride flush   PHYSICAL EXAM: Vital signs: Vitals:   05/31/18 0216 05/31/18 0424 05/31/18 0801 05/31/18 0813  BP:  139/79    Pulse:  (!) 106    Resp:  18    Temp:  99.9 F (37.7 C)    TempSrc:  Oral    SpO2:  95% (!) 88% 92%  Weight: 113.8 kg     Height:       Filed Weights   05/29/18 1155 05/30/18 0550 05/31/18 0216  Weight: 120 kg 119 kg 113.8 kg   Body mass index is 44.44 kg/m.   General appearance:Awake, alert, not in any distress.  Eyes:no scleral icterus. HEENT: Atraumatic and Normocephalic Neck: supple, no JVD. Resp:Good air entry bilaterally, continues to sound congested but better than the past few days. CVS: S1 S2 regular, no murmurs.  GI: Bowel sounds present, Non tender and not distended with no gaurding, rigidity or rebound. Extremities: B/L Lower Ext shows no edema, both legs are warm to touch Neurology:  Non focal Psychiatric: Normal judgment and insight. Normal mood. Musculoskeletal:No digital cyanosis Skin:No Rash, warm and dry Wounds:N/A  I have personally reviewed following labs and imaging studies  LABORATORY DATA: CBC: Recent Labs  Lab 05/26/18 0320 05/27/18 0354 05/28/18 0449 05/29/18 1207 05/30/18 0342  WBC 13.5* 10.4 8.1 6.3 5.9  NEUTROABS  --   --  6.2  --   --   HGB 8.3* 8.1* 8.4* 8.1* 7.8*  HCT 26.4* 26.6* 27.8* 26.5* 24.7*  MCV 85.2 85.0 85.0 84.4 84.6  PLT 381 325 281 284 009    Basic Metabolic Panel: Recent Labs  Lab 05/27/18 0354 05/28/18 0449 05/29/18 0440 05/29/18 1207 05/30/18 0342 05/31/18 0409  NA 140 139  --  139 141 140  K 3.2* 3.6  --  3.6 3.7 3.6  CL 103 109  --  108 111 109  CO2 25 24  --  23 26  24   GLUCOSE 132* 163*  --  156* 130* 137*  BUN 15 15  --  15 15 15   CREATININE 0.74 0.71  --  0.75 0.75 0.80  CALCIUM 8.0* 7.6*  --  7.8* 7.6* 7.8*  MG  --  2.0  --   --  2.2 2.2  PHOS  --  1.9* 2.9  --   --  2.6    GFR: Estimated Creatinine Clearance: 93.2 mL/min (by C-G formula based on SCr of 0.8 mg/dL).  Liver Function Tests: Recent Labs  Lab 05/28/18 0449 05/31/18 0409  AST 36 94*  ALT 28 73*  ALKPHOS 78 90  BILITOT 0.2* 0.1*  PROT 5.7* 5.8*  ALBUMIN 2.0* 2.0*   No results for input(s): LIPASE, AMYLASE in the last 168 hours. No results for input(s): AMMONIA in the last 168 hours.  Coagulation Profile: No results for input(s): INR, PROTIME in the last 168 hours.  Cardiac Enzymes: No results for input(s): CKTOTAL, CKMB, CKMBINDEX, TROPONINI in the last 168 hours.  BNP (last 3 results) No results for input(s): PROBNP in the last 8760 hours.  HbA1C: No results for input(s): HGBA1C in the last 72 hours.  CBG: Recent Labs  Lab 05/30/18 2023 05/31/18 0016 05/31/18 0423 05/31/18 0520 05/31/18 0833  GLUCAP 134* 134* 125* 130* 148*    Lipid Profile: No results for input(s): CHOL, HDL, LDLCALC, TRIG, CHOLHDL, LDLDIRECT in the last 72 hours.  Thyroid Function Tests: No results for input(s): TSH, T4TOTAL, FREET4, T3FREE, THYROIDAB in the last 72 hours.  Anemia Panel: No results for input(s): VITAMINB12, FOLATE, FERRITIN, TIBC, IRON, RETICCTPCT in the last 72 hours.  Urine analysis:    Component Value Date/Time   COLORURINE YELLOW 05/14/2018 2014   APPEARANCEUR CLOUDY (A) 05/14/2018 2014   LABSPEC 1.023 05/14/2018 2014   PHURINE 5.0 05/14/2018 2014   GLUCOSEU >=500 (A) 05/14/2018 2014   HGBUR MODERATE (A) 05/14/2018 2014   BILIRUBINUR NEGATIVE 05/14/2018 2014   KETONESUR 20 (A) 05/14/2018 2014   PROTEINUR 100 (A) 05/14/2018 2014   NITRITE NEGATIVE 05/14/2018 2014   LEUKOCYTESUR TRACE (A) 05/14/2018 2014    Sepsis Labs: Lactic Acid, Venous      Component Value Date/Time   LATICACIDVEN 1.5 05/27/2018 1701    MICROBIOLOGY: Recent Results (from the past 240 hour(s))  Culture, blood (routine x 2)     Status: None   Collection Time: 05/24/18  7:20 PM  Result Value Ref Range Status   Specimen Description BLOOD LEFT ANTECUBITAL  Final   Special Requests   Final    BOTTLES DRAWN AEROBIC AND ANAEROBIC Blood Culture adequate volume   Culture   Final    NO GROWTH 5 DAYS Performed at [facility], [place] [address] [place], [place] [address]    Report Status 05/29/2018 FINAL  Final  Culture, blood (routine x 2)     Status: None   Collection Time: 05/24/18 10:28 PM  Result Value Ref Range Status   Specimen Description BLOOD LEFT THUMB  Final   Special Requests   Final    BOTTLES DRAWN AEROBIC ONLY Blood Culture adequate volume   Culture   Final    NO GROWTH 5 DAYS Performed at [facility], [place] [address], [place], [place] [address]    Report Status 05/30/2018 FINAL  Final  Culture, blood (routine x 2)     Status: None (Preliminary result)   Collection Time: 05/27/18 11:54 AM  Result Value Ref Range Status   Specimen Description BLOOD LEFT ANTECUBITAL  Final   Special Requests   Final    BOTTLES DRAWN AEROBIC ONLY Blood Culture results may not be optimal due to an inadequate volume of blood received in culture bottles  Culture   Final    NO GROWTH 4 DAYS Performed at [facility], [place] [address] [place], [place] [address]    Report Status PENDING  Incomplete  Culture, blood (routine x 2)     Status: None (Preliminary result)   Collection Time: 05/27/18 12:04 PM  Result Value Ref Range Status   Specimen Description BLOOD LEFT ANTECUBITAL  Final   Special Requests   Final    BOTTLES DRAWN AEROBIC ONLY Blood Culture results may not be optimal due to an inadequate volume of blood received in culture bottles   Culture   Final    NO GROWTH 4 DAYS Performed at [facility], [place] [address]  [place], [place] [address]    Report Status PENDING  Incomplete    RADIOLOGY STUDIES/RESULTS: Ct Head Wo Contrast  Result Date: 05/18/2018 CLINICAL DATA:  Altered level of consciousness. Patient following commands intermittently. EXAM: CT HEAD WITHOUT CONTRAST TECHNIQUE: Contiguous axial images were obtained from the base of the skull through the vertex without intravenous contrast. COMPARISON:  None. FINDINGS: Brain: No evidence of acute infarction, hemorrhage, hydrocephalus, extra-axial collection or mass lesion/mass effect. Vascular: No hyperdense vessel or unexpected calcification. Skull: Normal. Negative for fracture or focal lesion. Sinuses/Orbits: Globes and orbits are unremarkable. There is mild mucosal thickening lining the ethmoid air cells and left sphenoid sinus with minimal right sphenoid sinus and maxillary sinus mucosal thickening. Other: None. IMPRESSION: 1. No intracranial abnormalities. 2. Sinus mucosal thickening. Electronically Signed   By: Lajean Manes M.D.   On: 05/18/2018 14:29   Ct Chest W Contrast  Result Date: 05/28/2018 CLINICAL DATA:  Chest congestion and nonproductive cough. EXAM: CT CHEST WITH CONTRAST TECHNIQUE: Multidetector CT imaging of the chest was performed during intravenous contrast administration. CONTRAST:  51mL OMNIPAQUE IOHEXOL 300 MG/ML  SOLN COMPARISON:  Chest radiographs, 05/28/2018 and prior exams. FINDINGS: Cardiovascular: Heart is normal in size. No pericardial effusion. Great vessels are normal in caliber. No aortic atherosclerosis. Mediastinum/Nodes: Mild prominence of the right thyroid lobe with areas of decreased attenuation. Suspect multiple nodules, not well-defined by CT. No neck base or axillary masses or enlarged lymph nodes. No mediastinal or hilar masses or adenopathy. Trachea is unremarkable. Mild distention of the distal esophagus versus a small hiatal hernia. There is a focus of increased attenuation within this, likely some ingested dense  material. No esophageal mass or wall thickening. Lungs/Pleura: Trace right pleural effusion. Opacity in the right lower lobe, most evident in the central lower lobe. This may reflect atelectasis or infection. There is a small area of consolidation right upper lobe centered on image 49, series 5. Small amount of dependent opacity is noted in the right upper lobe against the minor fissure, likely atelectasis. Additional areas hazy ground-glass type opacity in both lungs, mostly in the upper lobes and right middle lobe. This may be due to infection/inflammation or air trapping from airways disease. Low lung volumes accentuates these findings. No lung mass or nodule. No evidence of pulmonary edema. No pneumothorax. Upper Abdomen: No acute abnormality. Musculoskeletal: No chest wall abnormality. No acute or significant osseous findings. IMPRESSION: 1. Consolidation in the right lower lobe, which may reflect atelectasis or pneumonia. Suspect pneumonia at least as a component. There is a small area of consolidation in the right upper lobe, consistent with an additional focus of pneumonia. Both lungs show areas of ground-glass opacity which may reflect additional infection/inflammation or be due to airways disease and air trapping. 2. No evidence  of pulmonary edema. 3. Minimal right pleural effusion. Electronically Signed   By: Lajean Manes M.D.   On: 05/28/2018 11:25   Ct Abdomen Pelvis W Contrast  Result Date: 05/24/2018 CLINICAL DATA:  Postop course prolonged and complicated by ileus-on TNA-due to worsening leukocytosis. Patient underwent exploratory laparotomy on 05/16/2018 small bowel resection. Concern for ileus versus small bowel obstruction or abscess. Leukocytosis. Question of anastomotic leak. EXAM: CT ABDOMEN AND PELVIS WITH CONTRAST TECHNIQUE: Multidetector CT imaging of the abdomen and pelvis was performed using the standard protocol following bolus administration of intravenous contrast. CONTRAST:  116mL  OMNIPAQUE IOHEXOL 300 MG/ML  SOLN COMPARISON:  CT of the abdomen and pelvis on 05/14/2018 FINDINGS: Lower chest: There is new consolidation at the RIGHT LOWER lobe. Subsegmental atelectasis is identified at the LEFT lung base. Hepatobiliary: Small cysts within the liver measures 1.1 centimeters. The gallbladder is present and contains faintly radiopaque calculus or calculi. Pancreas: Unremarkable. No pancreatic ductal dilatation or surrounding inflammatory changes. Spleen: Normal in size without focal abnormality. Adrenals/Urinary Tract: Adrenal glands are unremarkable. Kidneys are normal, without renal calculi, focal lesion, or hydronephrosis. Bladder is unremarkable. Stomach/Bowel: The stomach is normal in appearance. There has been interval resection of abnormal small bowel loop in the RIGHT LOWER QUADRANT. The anastomosis in the RIGHT LOWER QUADRANT appears unremarkable. There are persistent dilated loops of small bowel particularly in the central abdomen showing multiple caliber changes, suggestive of adhesions. Numerous air-fluid levels are identified in the central small bowel loops. Although there is fluid in the mesentery and RIGHT LOWER QUADRANT, there is no evidence for anastomotic leak of contrast. No free intraperitoneal air. There are scattered colonic diverticula but no evidence for acute diverticulitis. There is central mesenteric edema. Colon is normal in caliber. Vascular/Lymphatic: No significant vascular findings are present. No enlarged abdominal or pelvic lymph nodes. Reproductive: Uterus is enlarged and contains numerous low-attenuation lesions, suggestive of degenerating fibroids. No adnexal mass. Other: No free pelvic fluid. Mesenteric ascites. Edematous changes identified within the anterior abdominal wall. Postoperative changes in the anterior abdomen. Musculoskeletal: No acute or significant osseous findings. IMPRESSION: 1. Interval resection of abnormal small bowel loop in the RIGHT  LOWER QUADRANT. 2. Persistent dilated loops of small bowel in the central abdomen, suggestive of adhesions, and consistent with small bowel obstruction. 3. No evidence for anastomotic leak. 4. Mesenteric edema. 5. New consolidation at the RIGHT lung base. 6. Cholelithiasis. 7. Enlarged uterus containing numerous low-attenuation lesions, suggestive of degenerating fibroids. Electronically Signed   By: Nolon Nations M.D.   On: 05/24/2018 16:06   Dg Chest Port 1 View  Result Date: 05/28/2018 CLINICAL DATA:  59 year old female with shortness breath. Diabetes. Subsequent encounter. EXAM: PORTABLE CHEST 1 VIEW COMPARISON:  05/27/2018 chest x-ray. 05/24/2018 CT abdomen and pelvis. FINDINGS: Persistent elevated right hemidiaphragm. Right mid to lower lobe consolidation may represent atelectasis or infiltrate and appears similar to prior exam. Central pulmonary vascular congestion. Mild cardiomegaly. No pneumothorax detected. Right PICC line tip mid superior vena cava level. No acute osseous abnormality. IMPRESSION: 1. Persistent elevated right hemidiaphragm. Right mid to lower lobe consolidation may represent atelectasis or infiltrate and appears similar to prior exam. 2. Central pulmonary vascular congestion. 3. Mild cardiomegaly. Electronically Signed   By: Genia Del M.D.   On: 05/28/2018 08:01   Dg Chest Port 1 View  Result Date: 05/27/2018 CLINICAL DATA:  Shortness of breath and cough EXAM: PORTABLE CHEST 1 VIEW COMPARISON:  Yesterday FINDINGS: Streaky opacities at the bases with elevated  right diaphragm. Cardiomegaly. No Kerley lines or pneumothorax. Unremarkable right upper extremity PICC position. IMPRESSION: No change in atelectatic opacities at the bases, more extensive on the right. Electronically Signed   By: Monte Fantasia M.D.   On: 05/27/2018 09:12   Dg Chest Port 1 View  Result Date: 05/17/2018 CLINICAL DATA:  Endotracheally intubated. EXAM: PORTABLE CHEST 1 VIEW COMPARISON:  05/15/2018  FINDINGS: Endotracheal tube terminates 2.5 cm above the carina. Enteric tube courses into the abdomen with tip not imaged and side hole below the diaphragm. Right jugular catheter terminates near the superior cavoatrial junction. The cardiomediastinal silhouette is unchanged with normal heart size. Mild bibasilar opacities have slightly improved. No sizable pleural effusion or pneumothorax is identified. IMPRESSION: Decreased, mild bibasilar atelectasis. Electronically Signed   By: Logan Bores M.D.   On: 05/17/2018 07:14   Dg Chest Port 1 View  Result Date: 05/15/2018 CLINICAL DATA:  Ventilator support EXAM: PORTABLE CHEST 1 VIEW COMPARISON:  05/14/2018 FINDINGS: Endotracheal tube tip is 2 cm above the carina. Nasogastric tube enters the stomach. Right internal jugular central line tip is in the proximal right atrium. No pneumothorax. Mild bilateral lower lobe atelectasis. IMPRESSION: Lines and tubes well positioned. Mild basilar atelectasis. Electronically Signed   By: Nelson Chimes M.D.   On: 05/15/2018 07:41   Dg Chest Portable 1 View  Result Date: 05/14/2018 CLINICAL DATA:  Abdominal pain for several days EXAM: PORTABLE CHEST 1 VIEW COMPARISON:  CT from earlier in the same day. FINDINGS: Elevation of the right hemidiaphragm is again seen. Cardiac shadow is within normal limits. Mild right basilar atelectatic changes are noted. No sizable effusion is seen. No bony abnormality is noted. IMPRESSION: Mild right basilar atelectasis. Electronically Signed   By: Inez Catalina M.D.   On: 05/14/2018 15:26   Dg Chest Port 1v Same Day  Result Date: 05/30/2018 CLINICAL DATA:  Shortness of breath, cough. EXAM: PORTABLE CHEST 1 VIEW COMPARISON:  Radiograph of May 14, 2018. CT scan of May 28, 2018. FINDINGS: Stable cardiomediastinal silhouette. Interval placement of right-sided PICC line with distal tip in expected position of the SVC. No pneumothorax or pleural effusion is noted. No consolidative process  is noted. Bony thorax is unremarkable. IMPRESSION: No acute cardiopulmonary abnormality seen. Interval placement of right-sided PICC line as described above. Electronically Signed   By: Marijo Conception, M.D.   On: 05/30/2018 11:57   Dg Chest Port 1v Same Day  Result Date: 05/27/2018 CLINICAL DATA:  Shortness of breath, history diabetes mellitus, GERD, hypertension EXAM: PORTABLE CHEST 1 VIEW COMPARISON:  Portable exam 1624 hours compared to 05/27/2018 FINDINGS: Normal heart size, mediastinal contours, and pulmonary vascularity. RIGHT arm PICC line tip projects over SVC. Chronic elevation of RIGHT diaphragm with subsegmental atelectasis at RIGHT base. Central peribronchial thickening. No infiltrate, pleural effusion or pneumothorax. IMPRESSION: Bronchitic changes with persistent RIGHT basilar atelectasis. Electronically Signed   By: Lavonia Dana M.D.   On: 05/27/2018 16:59   Dg Chest Port 1v Same Day  Result Date: 05/26/2018 CLINICAL DATA:  58 year old female with shortness of breath. Recent abdominal surgery. EXAM: PORTABLE CHEST 1 VIEW COMPARISON:  Prior chest x-ray 05/17/2018 FINDINGS: The patient has been extubated and the nasogastric tube removed. Additionally, the right IJ central venous catheter has been removed. A right upper extremity PICC has been placed. The catheter tip overlies the distal SVC. Dense band of linear airspace opacity in the right lung obscuring the right cardiac margin favored to represent right middle lobar collapse/atelectasis.  Additionally, there is linear atelectasis in the left lower lobe. No evidence of pulmonary edema or pneumothorax. No acute osseous abnormality. IMPRESSION: 1. Multifocal atelectasis including right middle lobe collapse. This may be secondary to mucous plugging. 2. Well-positioned right upper extremity approach PICC. Electronically Signed   By: Jacqulynn Cadet M.D.   On: 05/26/2018 10:19   Dg Abd Portable 1v  Result Date: 05/26/2018 CLINICAL DATA:   58 year old female with abdominal distension and shortness of breath EXAM: PORTABLE ABDOMEN - 1 VIEW COMPARISON:  CT abdomen/pelvis 05/24/2018 FINDINGS: Persistent diffuse gaseous distention of multiple loops of small bowel throughout the abdomen. No massive free air visualized on this supine radiograph. Lower lumbar degenerative disc disease again noted. IMPRESSION: Persistent gaseous distention of multiple loops of small bowel throughout the abdomen most consistent with ileus. Electronically Signed   By: Jacqulynn Cadet M.D.   On: 05/26/2018 10:18   Dg Abd Portable 1v  Result Date: 05/24/2018 CLINICAL DATA:  Initial evaluation for acute vomiting, small bowel obstruction. EXAM: PORTABLE ABDOMEN - 1 VIEW COMPARISON:  Prior CT from 05/14/2018. FINDINGS: Multiple dilated gas-filled loops of small bowel seen within the left and mid abdomen, compatible with ongoing small bowel obstruction. Loops dilated up to approximately 4 cm in diameter. Paucity of gas distally. No soft tissue mass or abnormal calcification. No appreciable free air on these limited supine views of the abdomen. IMPRESSION: Multiple persistent gas-filled dilated loops of small bowel within the left and mid abdomen, compatible with ongoing small bowel obstruction. Electronically Signed   By: Jeannine Boga M.D.   On: 05/24/2018 06:00   Ct Renal Stone Study  Result Date: 05/14/2018 CLINICAL DATA:  58 y/o F; nausea, vomiting, and abdominal pain for 3 days. EXAM: CT ABDOMEN AND PELVIS WITHOUT CONTRAST TECHNIQUE: Multidetector CT imaging of the abdomen and pelvis was performed following the standard protocol without IV contrast. COMPARISON:  None. FINDINGS: Lower chest: Platelike atelectasis in the lung bases. Hepatobiliary: No focal liver abnormality is seen. No gallstones, gallbladder wall thickening, or biliary dilatation. Pancreas: Unremarkable. No pancreatic ductal dilatation or surrounding inflammatory changes. Spleen: Normal in size  without focal abnormality. Adrenals/Urinary Tract: Adrenal glands are unremarkable. Kidneys are normal, without renal calculi, focal lesion, or hydronephrosis. Bladder is unremarkable. Stomach/Bowel: Severe diffuse small-bowel obstruction with transition at the level of the distal ileum in the right lower quadrant (series 3, image 65). Mild sigmoid diverticulosis without findings of acute diverticulitis. Otherwise normal appearance of the colon. Vascular/Lymphatic: Aortic atherosclerosis. No enlarged abdominal or pelvic lymph nodes. Reproductive: Negative. Other: There are a few tiny foci of pneumoperitoneum at the liver hilum, anterior abdominal wall, and below the diaphragm (series 3, image 12, 24, 27). Small volume of ascites. Musculoskeletal: No fracture is seen. Lumbar spondylosis with prominent facet arthropathy. IMPRESSION: 1. Severe diffuse small bowel obstruction with transition level of distal ileum in right lower quadrant. 2. Few punctate foci of pneumoperitoneum indicating small/micro perforation. Small volume of ascites. These results were called by telephone at the time of interpretation on 05/14/2018 at 3:27 pm to Dr. Andy Gauss, who verbally acknowledged these results. Electronically Signed   By: Kristine Garbe M.D.   On: 05/14/2018 15:34   [place] Ekg Site Rite  Result Date: 05/23/2018 If Site Rite image not attached, placement could not be confirmed due to current cardiac rhythm.    LOS: 17 days   Oren Binet, MD  [facility]  If 7PM-7AM, please contact night-coverage  Please page via [email or web address]-click on MD name  and type text message  05/31/2018, 10:47 AM

## 2018-05-31 NOTE — Progress Notes (Signed)
OT Cancellation Note  Patient Details Name: Wendy [name] MRN: [id number] DOB: Jul 06, 1960   Cancelled Treatment:    Reason Eval/Treat Not Completed: Patient declined, no reason specified stated "today is not a good day" and requested we return tomorrow. OT will return later if time allows.  Dorinda Hill OTR/L Acute Rehabilitation Services Office: [place] 05/31/2018, 3:45 PM

## 2018-05-31 NOTE — Progress Notes (Signed)
[facility]/Trauma Progress Note  15 Days Post-Op   Assessment/Plan DKA- per medicine, resolved Sepsis- secondary to below intra-abdominal problem Right-side PNA- only pulls 750 at best on IS, per medicine  SBO,Pneumoperitoneum, intussusception with necrosis & perforation - S/P ex lap, small bowel resection, closure with abthera vac, Dr. Grandville Silos, 01/17 -S/PExploratorylaparotomy with small bowel anastomosis and closure of abdominal fascia, Dr. Brantley Stage, 01/29POD 14 - NGTcame out 02/05 -CT scan2/6showing ileus vs SBO, anastomosis intact, no abscess -ileus-improving,having bowel function but not eating much - needs to mobilize more, continue therapies - IS - wound vac changes MWF  FEN: soft diet, TPN until pt takes more PO VTE: SCD's, startedlovenox 01/30 LF:YBOF/BPZWCH for HCAP, WBC WNL, febrile Foley:DC02/05 Follow up:Dr. [place] c diff. Continue TPN until reliably taking in enough PO.    LOS: 17 days    Subjective: CC: abdominal soreness  No worsening abdominal pain. Sore when she gets up. She states every time she eats or drinks anything her stomach rumbles and she has to have a BM. She states loose stools. I explained the importance of proper nutrition to heal. She states she will try to eat more today. No productive cough. No blood in stools. She states she cannot find her purse. She states she had it when she came to the ER.   Objective: Vital signs in last 24 hours: Temp:  [99.3 F (37.4 C)-101.7 F (38.7 C)] 99.9 F (37.7 C) (02/13 0424) Pulse Rate:  [106-115] 106 (02/13 0424) Resp:  [18-19] 18 (02/13 0424) BP: (139-152)/(77-79) 139/79 (02/13 0424) SpO2:  [88 %-98 %] 92 % (02/13 0813) Weight:  [113.8 kg] 113.8 kg (02/13 0216) Last BM Date: 05/30/18  Intake/Output from previous day: 02/12 0701 - 02/13 0700 In: 3277.1 [I.V.:2977.1; IV Piggyback:300] Out: 2750 [Urine:2700; Drains:50] Intake/Output this  shift: No intake/output data recorded.  PE: Gen:  Alert, NAD, pleasant, cooperative Pulm:  Diffuse rhonchi, effort normal Abd: Soft, no distension, nontender, +BS, vac to midline wound Skin: warm and dry  Anti-infectives: Anti-infectives (From admission, onward)   Start     Dose/Rate Route Frequency Ordered Stop   05/30/18 1830  Ampicillin-Sulbactam (UNASYN) 3 g in sodium chloride 0.9 % 100 mL IVPB  Status:  Discontinued     3 g 200 mL/hr over 30 Minutes Intravenous Every 6 hours 05/30/18 1810 05/31/18 0823   05/30/18 1530  amoxicillin-clavulanate (AUGMENTIN) 875-125 MG per tablet 1 tablet  Status:  Discontinued     1 tablet Oral Every 12 hours 05/30/18 1447 05/30/18 1810   05/28/18 0500  vancomycin (VANCOCIN) 1,250 mg in sodium chloride 0.9 % 250 mL IVPB  Status:  Discontinued     1,250 mg 166.7 mL/hr over 90 Minutes Intravenous Every 12 hours 05/27/18 1612 05/30/18 0936   05/27/18 1700  vancomycin (VANCOCIN) 2,500 mg in sodium chloride 0.9 % 500 mL IVPB     2,500 mg 250 mL/hr over 120 Minutes Intravenous  Once 05/27/18 1612 05/27/18 1922   05/27/18 1600  meropenem (MERREM) 2 g in sodium chloride 0.9 % 100 mL IVPB  Status:  Discontinued     2 g 200 mL/hr over 30 Minutes Intravenous Every 8 hours 05/27/18 1544 05/30/18 1442   05/26/18 1300  Ampicillin-Sulbactam (UNASYN) 3 g in sodium chloride 0.9 % 100 mL IVPB  Status:  Discontinued     3 g 200 mL/hr over 30 Minutes Intravenous Every 6 hours 05/26/18 1234 05/27/18 1538   05/19/18 1400  vancomycin (VANCOCIN) 1,250 mg  in sodium chloride 0.9 % 250 mL IVPB  Status:  Discontinued     1,250 mg 166.7 mL/hr over 90 Minutes Intravenous Every 24 hours 05/18/18 1343 05/19/18 1244   05/19/18 1400  meropenem (MERREM) 2 g in sodium chloride 0.9 % 100 mL IVPB     2 g 200 mL/hr over 30 Minutes Intravenous Every 8 hours 05/19/18 1244 05/26/18 2359   05/18/18 1600  cefTRIAXone (ROCEPHIN) 2 g in sodium chloride 0.9 % 100 mL IVPB  Status:   Discontinued     2 g 200 mL/hr over 30 Minutes Intravenous Every 24 hours 05/18/18 1326 05/19/18 1244   05/18/18 1400  vancomycin (VANCOCIN) 2,000 mg in sodium chloride 0.9 % 500 mL IVPB     2,000 mg 250 mL/hr over 120 Minutes Intravenous  Once 05/18/18 1343 05/18/18 1640   05/15/18 2333  ceFEPIme (MAXIPIME) 2 g in sodium chloride 0.9 % 100 mL IVPB  Status:  Discontinued     2 g 200 mL/hr over 30 Minutes Intravenous Every 8 hours 05/15/18 2226 05/18/18 1326   05/15/18 1500  ceFEPIme (MAXIPIME) 1 g in sodium chloride 0.9 % 100 mL IVPB  Status:  Discontinued     1 g 200 mL/hr over 30 Minutes Intravenous Every 24 hours 05/14/18 1512 05/15/18 2226   05/15/18 0000  anidulafungin (ERAXIS) 100 mg in sodium chloride 0.9 % 100 mL IVPB  Status:  Discontinued     100 mg 78 mL/hr over 100 Minutes Intravenous Every 24 hours 05/14/18 1728 05/15/18 1159   05/14/18 1800  anidulafungin (ERAXIS) 200 mg in sodium chloride 0.9 % 200 mL IVPB     200 mg 78 mL/hr over 200 Minutes Intravenous  Once 05/14/18 1751 05/14/18 2138   05/14/18 1745  vancomycin (VANCOCIN) 2,000 mg in sodium chloride 0.9 % 500 mL IVPB  Status:  Discontinued     2,000 mg 250 mL/hr over 120 Minutes Intravenous  Once 05/14/18 1741 05/15/18 1159   05/14/18 1741  vancomycin variable dose per unstable renal function (pharmacist dosing)  Status:  Discontinued      Does not apply See admin instructions 05/14/18 1741 05/15/18 1159   05/14/18 1515  ceFEPIme (MAXIPIME) 2 g in sodium chloride 0.9 % 100 mL IVPB     2 g 200 mL/hr over 30 Minutes Intravenous  Once 05/14/18 1512 05/14/18 1649   05/14/18 1515  metroNIDAZOLE (FLAGYL) IVPB 500 mg  Status:  Discontinued     500 mg 100 mL/hr over 60 Minutes Intravenous Every 8 hours 05/14/18 1512 05/19/18 1244      Lab Results:  Recent Labs    05/29/18 1207 05/30/18 0342  WBC 6.3 5.9  HGB 8.1* 7.8*  HCT 26.5* 24.7*  PLT 284 271   BMET Recent Labs    05/30/18 0342 05/31/18 0409  NA 141 140   K 3.7 3.6  CL 111 109  CO2 26 24  GLUCOSE 130* 137*  BUN 15 15  CREATININE 0.75 0.80  CALCIUM 7.6* 7.8*   PT/INR No results for input(s): LABPROT, INR in the last 72 hours. CMP     Component Value Date/Time   NA 140 05/31/2018 0409   NA 144 12/25/2014 1536   K 3.6 05/31/2018 0409   CL 109 05/31/2018 0409   CO2 24 05/31/2018 0409   GLUCOSE 137 (H) 05/31/2018 0409   BUN 15 05/31/2018 0409   BUN 9 12/25/2014 1536   CREATININE 0.80 05/31/2018 0409   CREATININE 0.69 05/25/2017 1150  CALCIUM 7.8 (L) 05/31/2018 0409   PROT 5.8 (L) 05/31/2018 0409   PROT 6.7 12/25/2014 1536   ALBUMIN 2.0 (L) 05/31/2018 0409   ALBUMIN 4.4 12/25/2014 1536   AST 94 (H) 05/31/2018 0409   ALT 73 (H) 05/31/2018 0409   ALKPHOS 90 05/31/2018 0409   BILITOT 0.1 (L) 05/31/2018 0409   BILITOT 0.3 12/25/2014 1536   GFRNONAA >60 05/31/2018 0409   GFRNONAA 78 08/25/2016 1100   GFRAA >60 05/31/2018 0409   GFRAA >89 08/25/2016 1100   Lipase     Component Value Date/Time   LIPASE 37 05/14/2018 2014    Studies/Results: Dg Chest Port 1v Same Day  Result Date: 05/30/2018 CLINICAL DATA:  Shortness of breath, cough. EXAM: PORTABLE CHEST 1 VIEW COMPARISON:  Radiograph of May 14, 2018. CT scan of May 28, 2018. FINDINGS: Stable cardiomediastinal silhouette. Interval placement of right-sided PICC line with distal tip in expected position of the SVC. No pneumothorax or pleural effusion is noted. No consolidative process is noted. Bony thorax is unremarkable. IMPRESSION: No acute cardiopulmonary abnormality seen. Interval placement of right-sided PICC line as described above. Electronically Signed   By: Marijo Conception, M.D.   On: 05/30/2018 11:57      Kalman Drape , [facility] Surgery 05/31/2018, 10:16 AM  Pager: [phone] Mon-Wed, Friday 7:00am-4:30pm Thurs 7am-11:30am  Consults: [phone]

## 2018-05-31 NOTE — Progress Notes (Signed)
Nutrition Follow-up  DOCUMENTATION CODES:   Morbid obesity  INTERVENTION:   - TPN per pharmacy  - Provided education regarding the importance of adequate kcal and protein intake in healing  - Boost Breeze po TID, each supplement provides 250 kcal and 9 grams of protein  NUTRITION DIAGNOSIS:   Inadequate oral intake related to altered GI function as evidenced by NPO status.  Progressing, pt now on soft diet  GOAL:   Patient will meet greater than or equal to 90% of their needs  Progressing  MONITOR:   PO intake, Supplement acceptance, I & O's, Weight trends, Skin, Labs, Other (TPN)  REASON FOR ASSESSMENT:   Ventilator    ASSESSMENT:   58 yo female admitted with DKA, SBO with intussusception with necrosis and perforation s/p SB resection left in discontinuity on 1/27 with SB anastomosis and closure of abdominal fascia on 1/29. On vent postop. PMH includes HTN, DM, GERD, HLD.  1/27 - s/p ex-lap, SB resection, left in discontinuity, closure with abthera vac, vent post-op(81cm small bowel removed ~20 cm from the ileocecal valve) 1/29 - s/p ex-lap with small bowel anastomosis and closure of abdominal fascia 2/1 - extubated, PICC placed, TPN initiated 2/4 - trasnferred from ICU to floor 2/5 - NGT clamped and removed 2/10 - post-op ileus improving, clear liquids 2/11 - full liquids 2/12 - soft diet 2/13 - TPN decreased by half  Per surgery, will continue with TPN until pt's PO intake improves.  Total of 9 lbs weight loss since admission. Will continue to monitor weight trends.  Spoke with pt at bedside who reports that she did not eat breakfast due to being interrupted by providers. Noted untouched breakfast meal tray at bedside. Discussed the importance of adequate nutrition with pt and encouraged PO intake. Pt does not like milk-like supplements but is amenable to trying Boost Breeze.  Meal Completion: 0%  TPN running at 45 ml/hr which provides 1060 kcal, 63 grams  protein, 152 grams dextrose, and 29 grams lipids, meeting 50% of kcal needs and 54% of protein needs.  Medications reviewed and include: TPN as above, SSI q 4 hours, Lantus 24 units daily, Protonix  Labs reviewed: K, Mag, Phos all WDL CBG's: 148, 130, 125, 134, 134 x 12 hours  UOP: 2700 ml x 24 hours VAC: 50 ml x 24 hours I/O's: +2.4 L since admit  Diet Order:   Diet Order            DIET SOFT Room service appropriate? Yes; Fluid consistency: Thin  Diet effective now              EDUCATION NEEDS:   Education needs have been addressed  Skin:  Skin Assessment: Skin Integrity Issues: Stage II: R nare Wound Vac: abdomen  Last BM:  2/12  Height:   Ht Readings from Last 1 Encounters:  05/14/18 5\' 3"  (1.6 m)    Weight:   Wt Readings from Last 1 Encounters:  05/31/18 113.8 kg    Ideal Body Weight:  52.3 kg  BMI:  Body mass index is 44.44 kg/m.  Estimated Nutritional Needs:   Kcal:  2100-2300kcal/day   Protein:  116-128g/day   Fluid:  >1.6L/day     Gaynell Face, MS, RD, LDN Inpatient Clinical Dietitian Pager: [phone] Weekend/After Hours: [phone]

## 2018-05-31 NOTE — Care Management Note (Addendum)
Case Management Note  Patient Details  Name: Wendy [name] MRN: [id number] Date of Birth: 1960/07/28  Subjective/Objective:       Presented with abd pain, emesis,anorexia, polyuria/DKA/SBO. From home with husband.PTA independent  With ADL's, no DME usage.  S/P Exploratory laparotomy with small bowel anastomosis and closure of abdominal fascia, 05/16/2018, vac drsg   ANJELI CASAD (Spouse)      [phone]       Action/Plan:  Per CIR liaison , pt is  not in need of intense rehab.  Pt will d/cto home with home health services  ( PT,RN) when medically ready.... NCM will continue to monItor for TOC needs.   Expected Discharge Date:                  Expected Discharge Plan:  [place]  In-House Referral:  NA  Discharge planning Services  CM Consult  Post Acute Care Choice:  NA Choice offered to:  Patient  DME Arranged:    DME Agency:     HH Arranged:    [place] Agency:     Status of Service:  In process, will continue to follow  If discussed at Long Length of Stay Meetings, dates discussed:    Additional Comments:  Sharin Mons, RN 05/31/2018, 2:41 PM

## 2018-05-31 NOTE — Progress Notes (Signed)
[place] NOTE   Pharmacy Consult for TPN Indication: ileus  Patient Measurements: Height: 5\' 3"  (160 cm) Weight: 250 lb 14.4 oz (113.8 kg) IBW/kg (Calculated) : 52.4 TPN AdjBW (KG): 68.8 Body mass index is 44.44 kg/m. Usual Weight: 117.9 kg  Assessment:  58 y/o female presenting to the ED with 3 days of vomiting and abdominal pain found to be in DKA. CT showed SBO. Noted SBO, pneumoperitoneum, intussusception with necrosis and performation and was taken to the OR emergently for ex lap, SBR left in discontinuity, and abthera VAC on 1/27. Taken back to the OR on 1/29 for ex lap with SB anastomosis and fascia closure. High risk for refeeding.  Husband reports patient eating well prior to experiencing GI distress.  GI: SBO, pneumoperitoneum, intussusception with necrosis and perforation. Patient reporting nausea even with water. Albumin 2.1>2, prealbumin 10.1>14.4>17.8,  +flatus and  LBM 2/12. PPI IV>po for GERD. Po Intake 0 documented last 24hr of soft diet. Drains 43ml. C/o abdominal pain and loose BM. C/o food taste.  - CT scan 2/6 showing ileus vs SBO, anastomosis intact, no abscess Ileus improving - continuing CLD at this point  Endo: hx DM, CBGs 112-138 Insulin requirements in the past 24 hours: 12 units SSI, 60 units in TPN, and 24 units of Lantus  Lytes: K 3.6, Mag 2.2, Phos 1.9 > 2.9 Renal: AKI resolved, SCr stable Pulm: RA (Flonase,Mucinex, duonebs) Cards: h/o HLD, HTN, HR tachy Hepatobil: LFTs up acutely this AM 36>94, ALT 28>73, but T bili WNL, TG 139 Neuro: Effexor. Can be uncooperative. RC:VKFM 101.7, Tc 99.9, WBC 5.9 down. Merrem>>Unasyn. 1/27: Clostridium [place] 1/27: Peritoneal fluid: Enterococcus faecium, Clostridium  TPN Access: double lumen CVC placed 05/14/18 TPN start date: 05/19/18  Nutritional Goals (per RD recommendation on 05/19/18): KCal: 2100-2300 Protein: 116-128 g Fluid: >1.6L  Current Nutrition:   TPN CLD  Plan:   Diet advanced to soft  Decrease TPN to 48ml/hr to promote appetite - This TPN provides 63 g of protein, 151 g of dextrose, and 29 g of lipids which provides 1060 kCals per day, meeting 50% of patient needs  Electrolytes in TPN: continue same as previous   Continue MVI, trace elements in TPN  Continue insulin 60 units in TPN Continue resistant SSI Q4h and Lantus 24 units   Monitor TPN labs Mon/Thurs and PRN IV>po PPI   Leonila Speranza S. Alford Highland, PharmD, BCPS Clinical Staff Pharmacist 05/31/2018 8:16 AM

## 2018-05-31 NOTE — Progress Notes (Signed)
PT Cancellation Note  Patient Details Name: Wendy [name] MRN: [id number] DOB: [birth year]   Cancelled Treatment:    Reason Eval/Treat Not Completed: Patient declined, no reason specified- will return later if time allows.    Kenise Barraco 05/31/2018, 2:45 PM

## 2018-05-31 NOTE — Progress Notes (Signed)
Inpatient Rehabilitation Admissions Coordinator  I met with patient at bedside. Pt not in need of an intense inpt rehab at this time. Recommend Home with [facility] when medically ready. I did encourage pt to get up into the chair a couple of times a day as well as to aggressively use the flutter valve and incentive spirometer. I have updated RN CM, Levada Dy. We will sign off at this time.  Danne Baxter, RN, MSN Rehab Admissions Coordinator [phone] 05/31/2018 2:33 PM

## 2018-06-01 ENCOUNTER — Inpatient Hospital Stay (HOSPITAL_COMMUNITY): Payer: 59

## 2018-06-01 DIAGNOSIS — M7989 Other specified soft tissue disorders: Secondary | ICD-10-CM

## 2018-06-01 LAB — GLUCOSE, CAPILLARY
GLUCOSE-CAPILLARY: 108 mg/dL — AB (ref 70–99)
Glucose-Capillary: 73 mg/dL (ref 70–99)
Glucose-Capillary: 80 mg/dL (ref 70–99)
Glucose-Capillary: 89 mg/dL (ref 70–99)
Glucose-Capillary: 92 mg/dL (ref 70–99)
Glucose-Capillary: 92 mg/dL (ref 70–99)
Glucose-Capillary: 93 mg/dL (ref 70–99)

## 2018-06-01 LAB — CULTURE, BLOOD (ROUTINE X 2)
Culture: NO GROWTH
Culture: NO GROWTH

## 2018-06-01 LAB — CBC
HCT: 25.5 % — ABNORMAL LOW (ref 36.0–46.0)
Hemoglobin: 7.7 g/dL — ABNORMAL LOW (ref 12.0–15.0)
MCH: 25.4 pg — ABNORMAL LOW (ref 26.0–34.0)
MCHC: 30.2 g/dL (ref 30.0–36.0)
MCV: 84.2 fL (ref 80.0–100.0)
PLATELETS: 278 10*3/uL (ref 150–400)
RBC: 3.03 MIL/uL — ABNORMAL LOW (ref 3.87–5.11)
RDW: 16.9 % — ABNORMAL HIGH (ref 11.5–15.5)
WBC: 6.4 10*3/uL (ref 4.0–10.5)
nRBC: 0 % (ref 0.0–0.2)

## 2018-06-01 LAB — PREPARE RBC (CROSSMATCH)

## 2018-06-01 MED ORDER — ACETAMINOPHEN 325 MG PO TABS
650.0000 mg | ORAL_TABLET | Freq: Once | ORAL | Status: AC
  Administered 2018-06-01: 650 mg via ORAL
  Filled 2018-06-01: qty 2

## 2018-06-01 MED ORDER — TRAVASOL 10 % IV SOLN
INTRAVENOUS | Status: DC
  Administered 2018-06-01: 18:00:00 via INTRAVENOUS
  Filled 2018-06-01: qty 637.2

## 2018-06-01 MED ORDER — BOOST / RESOURCE BREEZE PO LIQD CUSTOM
1.0000 | Freq: Four times a day (QID) | ORAL | Status: DC
  Administered 2018-06-01 – 2018-06-04 (×8): 1 via ORAL

## 2018-06-01 MED ORDER — DIPHENHYDRAMINE HCL 50 MG/ML IJ SOLN
25.0000 mg | Freq: Once | INTRAMUSCULAR | Status: AC
  Administered 2018-06-01: 25 mg via INTRAVENOUS
  Filled 2018-06-01: qty 1

## 2018-06-01 MED ORDER — SODIUM CHLORIDE 0.9% IV SOLUTION
Freq: Once | INTRAVENOUS | Status: AC
  Administered 2018-06-01: 16:00:00 via INTRAVENOUS

## 2018-06-01 MED ORDER — FUROSEMIDE 10 MG/ML IJ SOLN
40.0000 mg | Freq: Once | INTRAMUSCULAR | Status: AC
  Administered 2018-06-01: 40 mg via INTRAVENOUS
  Filled 2018-06-01: qty 4

## 2018-06-01 NOTE — Progress Notes (Signed)
PT Cancellation Note  Patient Details Name: Wendy [name] MRN: [id number] DOB: [birth year]   Cancelled Treatment:    Reason Eval/Treat Not Completed: Medical issues which prohibited therapy- RN states patient is not having a good morning. Hgb is 7.7 and patient will most likely be getting blood transfusion today. Re-attempt at later date when appropriate.    Vilas Edgerly 06/01/2018, 9:27 AM

## 2018-06-01 NOTE — Progress Notes (Signed)
MIdline abdominal surgical wound with NPWT (VAC) dressing.  Wound type: Surgical Pressure Injury POA: NA Measurement: 18 cm x 4.5 cm x 2 cm  Wound bed: 100% granulating tissue Drainage (amount, consistency, odor) minimal serosanguinous No odor Periwound:intact Dressing procedure/placement/frequency: Cleanse midline wound with NS. Fill with black foam and cover with drape. Seal immediately achieved at 125. CHange M/w/f.  Cloretta Ned RN-BSN

## 2018-06-01 NOTE — Care Management Note (Signed)
Case Management Note  Patient Details  Name: Serin Thornell MRN: [id number] Date of Birth: [birth year]  Subjective/Objective:        Presented with  sepsis secondary to small bowel obstruction and small perforation, along with DKA.       MARQUITTA PERSICHETTI (Spouse)     [phone]       PCP: Steele Sizer  Action/Plan: Pt will d/c to home when medically stable with wound vac and home health services.   Pt has transportation to home.  Expected Discharge Date:                  Expected Discharge Plan:  [place]  In-House Referral:  NA  Discharge planning Services  CM Consult  Post Acute Care Choice:  NA Choice offered to:  Patient  DME Arranged:  V[place] DME Agency:  [facility], referral made with Tracy [facility], fax # [phone]  HH Arranged:  RN, PT [place] Agency:  [facility]  Status of Service:  In process, will continue to follow  If discussed at Long Length of Stay Meetings, dates discussed:    Additional Comments:  [address], RN 06/01/2018, 2:01 PM

## 2018-06-01 NOTE — Progress Notes (Signed)
OT Cancellation Note  Patient Details Name: Wendy [name] MRN: [id number] DOB: [birth year]   Cancelled Treatment:    Reason Eval/Treat Not Completed: Medical issues which prohibited therapy. Pt with Hgb of 7.7 and not appropriate for OT at this time. RN reports that pt not having a good morning and may get transfusion shortly. Will check back on pt next available/appropriate time  Britt Bottom 06/01/2018, 9:16 AM

## 2018-06-01 NOTE — Progress Notes (Signed)
PROGRESS NOTE        PATIENT DETAILS Name: Wendy [name] Age: 58 y.o. Sex: female Date of Birth: April 22, 1960 Admit Date: 05/14/2018 Admitting Physician Rush Farmer, MD KPT:[id number], Drue Stager, MD  Brief Narrative: Patient is a 58 y.o. female with history of DM-2, pretension, GERD, morbid obesity who presented with a 3-day history of vomiting and abdominal pain-found to have sepsis secondary to small bowel obstruction and small perforation, along with DKA.  She was started on IV insulin, empiric antimicrobial therapy-General surgery was consulted-and patient was admitted to the ICU under the PCCM service.  Hospital course lately has been complicated by development of ileus, pneumonia, and some intermittent diarrhea (C. difficile PCR negative)  Significant Events: 1/27 Admit > OR for ex lap with small bowel resection> ICU 1/29 OR for anastomosis and closure 2/1 Extubated 2/2 Hypoactive delirium, worsening respiratory status 2/8>>Right PNA  Subjective: No further diarrhea.  Afebrile overnight.  Claims has exertional dyspnea when she even walks to the bathroom.  Claims to have hardly any oral intake-as does not like hospital food.  Assessment/Plan: Acute hypoxic respiratory failure: Extubated on 2/1 (intubated for surgery) continue to wean to RA as able.  Septic shock secondary to peritonitis with bowel obstruction along with Clostridium bacteremia: Sepsis pathophysiology has resolved, blood cultures positive for Clostridium species.  Peritoneal fluid cultures positive for enterococcus and Clostridium as well.  Has completed a course of meropenem till 2/8.   Small bowel obstruction with perforation s/p resection and closure of the abdominal fascia with prolonged postop course due to ileus: Slowly improving-tolerating soft diet-continue TNA till oral intake is more reliable-General surgery recommending a calorie count to ensure adequate oral intake before TNA discontinued.   Wound VAC changes MWF-begin discharge planning-long discussion with case manager today.  Will need wound VAC for home.  Fever secondary to right-sided pneumonia: Fever finally resolved-no fever overnight.  Highly likely that pneumonia was secondary to- poor respiratory effort/cough effort-and refusal to be mobilized/ambulated.  Per nursing staff-patient has refused chest PT multiple times.  All antimicrobial therapy was discontinued on 2/13-slowly improving-now afebrile overnight.  Have counseled patient extensively over the past week or so regarding importance of chest PT, mobilization/ambulation, incentive spirometry and flutter valve-reemphasized today.  Monitor off antimicrobial therapy.  Anemia: Secondary to acute/critical illness-no evidence of blood loss.  Claims that over the past few days-she has had worsening exertional dyspnea-although this exertional dyspnea is multifactorial secondary to obesity, deconditioning-we will transfuse 1 unit of PRBC to see if symptoms improve.  Repeat CBC tomorrow.   Hypernatremia: Resolved  Type II DM with uncontrolled hyperglycemia: Had severe hyperglycemia with probable DKA on admission-CBGs currently stable with 20 points of Lantus and SSI.  Continue to follow.    Hypertension: Blood pressure relatively well controlled-continue to monitor off antihypertensives-once oral intake has resumed we can resume her blood pressure medications.    GERD: Continue PPI  Dyslipidemia: Resume statin when oral intake is further stabilized  Debility/deconditioning: Secondary to acute illness/critical illness-CIR when more stable over the next few days.  DVT Prophylaxis: Prophylactic Lovenox   Code Status: Full code   Family Communication: None at bedside  Disposition Plan: Remain inpatient-suspect CIR on discharge  Antimicrobial agents: Anti-infectives (From admission, onward)   Start     Dose/Rate Route Frequency Ordered Stop   05/30/18 1830   Ampicillin-Sulbactam (UNASYN) 3 g  in sodium chloride 0.9 % 100 mL IVPB  Status:  Discontinued     3 g 200 mL/hr over 30 Minutes Intravenous Every 6 hours 05/30/18 1810 05/31/18 0823   05/30/18 1530  amoxicillin-clavulanate (AUGMENTIN) 875-125 MG per tablet 1 tablet  Status:  Discontinued     1 tablet Oral Every 12 hours 05/30/18 1447 05/30/18 1810   05/28/18 0500  vancomycin (VANCOCIN) 1,250 mg in sodium chloride 0.9 % 250 mL IVPB  Status:  Discontinued     1,250 mg 166.7 mL/hr over 90 Minutes Intravenous Every 12 hours 05/27/18 1612 05/30/18 0936   05/27/18 1700  vancomycin (VANCOCIN) 2,500 mg in sodium chloride 0.9 % 500 mL IVPB     2,500 mg 250 mL/hr over 120 Minutes Intravenous  Once 05/27/18 1612 05/27/18 1922   05/27/18 1600  meropenem (MERREM) 2 g in sodium chloride 0.9 % 100 mL IVPB  Status:  Discontinued     2 g 200 mL/hr over 30 Minutes Intravenous Every 8 hours 05/27/18 1544 05/30/18 1442   05/26/18 1300  Ampicillin-Sulbactam (UNASYN) 3 g in sodium chloride 0.9 % 100 mL IVPB  Status:  Discontinued     3 g 200 mL/hr over 30 Minutes Intravenous Every 6 hours 05/26/18 1234 05/27/18 1538   05/19/18 1400  vancomycin (VANCOCIN) 1,250 mg in sodium chloride 0.9 % 250 mL IVPB  Status:  Discontinued     1,250 mg 166.7 mL/hr over 90 Minutes Intravenous Every 24 hours 05/18/18 1343 05/19/18 1244   05/19/18 1400  meropenem (MERREM) 2 g in sodium chloride 0.9 % 100 mL IVPB     2 g 200 mL/hr over 30 Minutes Intravenous Every 8 hours 05/19/18 1244 05/26/18 2359   05/18/18 1600  cefTRIAXone (ROCEPHIN) 2 g in sodium chloride 0.9 % 100 mL IVPB  Status:  Discontinued     2 g 200 mL/hr over 30 Minutes Intravenous Every 24 hours 05/18/18 1326 05/19/18 1244   05/18/18 1400  vancomycin (VANCOCIN) 2,000 mg in sodium chloride 0.9 % 500 mL IVPB     2,000 mg 250 mL/hr over 120 Minutes Intravenous  Once 05/18/18 1343 05/18/18 1640   05/15/18 2333  ceFEPIme (MAXIPIME) 2 g in sodium chloride 0.9 % 100 mL  IVPB  Status:  Discontinued     2 g 200 mL/hr over 30 Minutes Intravenous Every 8 hours 05/15/18 2226 05/18/18 1326   05/15/18 1500  ceFEPIme (MAXIPIME) 1 g in sodium chloride 0.9 % 100 mL IVPB  Status:  Discontinued     1 g 200 mL/hr over 30 Minutes Intravenous Every 24 hours 05/14/18 1512 05/15/18 2226   05/15/18 0000  anidulafungin (ERAXIS) 100 mg in sodium chloride 0.9 % 100 mL IVPB  Status:  Discontinued     100 mg 78 mL/hr over 100 Minutes Intravenous Every 24 hours 05/14/18 1728 05/15/18 1159   05/14/18 1800  anidulafungin (ERAXIS) 200 mg in sodium chloride 0.9 % 200 mL IVPB     200 mg 78 mL/hr over 200 Minutes Intravenous  Once 05/14/18 1751 05/14/18 2138   05/14/18 1745  vancomycin (VANCOCIN) 2,000 mg in sodium chloride 0.9 % 500 mL IVPB  Status:  Discontinued     2,000 mg 250 mL/hr over 120 Minutes Intravenous  Once 05/14/18 1741 05/15/18 1159   05/14/18 1741  vancomycin variable dose per unstable renal function (pharmacist dosing)  Status:  Discontinued      Does not apply See admin instructions 05/14/18 1741 05/15/18 1159   05/14/18 1515  ceFEPIme (MAXIPIME) 2 g in sodium chloride 0.9 % 100 mL IVPB     2 g 200 mL/hr over 30 Minutes Intravenous  Once 05/14/18 1512 05/14/18 1649   05/14/18 1515  metroNIDAZOLE (FLAGYL) IVPB 500 mg  Status:  Discontinued     500 mg 100 mL/hr over 60 Minutes Intravenous Every 8 hours 05/14/18 1512 05/19/18 1244      Procedures: 2/6>>PICC line 1/29>> exploratory laparotomy with small bowel anastomosis and closure of abdominal fascia 1/27>>Right IJ (Anesthesia) 1/27>>EXPLORATORY LAPAROTOMY,SMALL BOWEL RESECTION,CLOSURE WITH ABTHERA VAC  CONSULTS:  pulmonary/intensive care and general surgery  Time spent: 25- minutes-Greater than 50% of this time was spent in counseling, explanation of diagnosis, planning of further management, and coordination of care.  MEDICATIONS: Scheduled Meds: . chlorhexidine  15 mL Mouth Rinse BID  . Chlorhexidine  Gluconate Cloth  6 each Topical Daily  . enoxaparin (LOVENOX) injection  40 mg Subcutaneous Q24H  . feeding supplement  1 Container Oral TID BM  . fluticasone  2 spray Each Nare Daily  . guaiFENesin  600 mg Oral BID  . insulin aspart  0-20 Units Subcutaneous Q4H  . insulin glargine  24 Units Subcutaneous Daily  . ipratropium-albuterol  3 mL Nebulization TID  . mouth rinse  15 mL Mouth Rinse BID  . pantoprazole  40 mg Oral Daily  . sodium chloride flush  10-40 mL Intracatheter Q12H  . venlafaxine XR  150 mg Oral Daily   Continuous Infusions: . dextrose 10 mL/hr at 05/25/18 1455  . lactated ringers 50 mL/hr at 05/29/18 0700  . TPN ADULT (ION) 45 mL/hr at 05/31/18 1719  . TPN ADULT (ION)     PRN Meds:.acetaminophen, alum & mag hydroxide-simeth, fentaNYL (SUBLIMAZE) injection, ibuprofen, iopamidol, lip balm, sodium chloride flush, sodium chloride flush   PHYSICAL EXAM: Vital signs: Vitals:   06/01/18 0500 06/01/18 0502 06/01/18 0827 06/01/18 1328  BP:  (!) 141/74  (!) 155/84  Pulse:  (!) 101  (!) 105  Resp:  18  16  Temp:  99.3 F (37.4 C)  99.2 F (37.3 C)  TempSrc:  Oral  Oral  SpO2:  100% 92% 100%  Weight: 114.2 kg     Height:       Filed Weights   05/30/18 0550 05/31/18 0216 06/01/18 0500  Weight: 119 kg 113.8 kg 114.2 kg   Body mass index is 44.6 kg/m.   General appearance:Awake, alert, not in any distress.  Eyes:no scleral icterus. HEENT: Atraumatic and Normocephalic Neck: supple, no JVD. Resp:Good air entry bilaterally,no rales or rhonchi CVS: S1 S2 regular, no murmurs.  GI: Bowel sounds present, Non tender and not distended with no gaurding, rigidity or rebound. Extremities: B/L Lower Ext shows trace edema, both legs are warm to touch Neurology:  Non focal Psychiatric: Normal judgment and insight. Normal mood. Musculoskeletal:No digital cyanosis Skin:No Rash, warm and dry Wounds:N/A  I have personally reviewed following labs and imaging  studies  LABORATORY DATA: CBC: Recent Labs  Lab 05/27/18 0354 05/28/18 0449 05/29/18 1207 05/30/18 0342 06/01/18 0404  WBC 10.4 8.1 6.3 5.9 6.4  NEUTROABS  --  6.2  --   --   --   HGB 8.1* 8.4* 8.1* 7.8* 7.7*  HCT 26.6* 27.8* 26.5* 24.7* 25.5*  MCV 85.0 85.0 84.4 84.6 84.2  PLT 325 281 284 271 086    Basic Metabolic Panel: Recent Labs  Lab 05/27/18 0354 05/28/18 0449 05/29/18 0440 05/29/18 1207 05/30/18 0342 05/31/18 0409  NA 140 139  --  139 141 140  K 3.2* 3.6  --  3.6 3.7 3.6  CL 103 109  --  108 111 109  CO2 25 24  --  23 26 24   GLUCOSE 132* 163*  --  156* 130* 137*  BUN 15 15  --  15 15 15   CREATININE 0.74 0.71  --  0.75 0.75 0.80  CALCIUM 8.0* 7.6*  --  7.8* 7.6* 7.8*  MG  --  2.0  --   --  2.2 2.2  PHOS  --  1.9* 2.9  --   --  2.6    GFR: Estimated Creatinine Clearance: 93.3 mL/min (by C-G formula based on SCr of 0.8 mg/dL).  Liver Function Tests: Recent Labs  Lab 05/28/18 0449 05/31/18 0409  AST 36 94*  ALT 28 73*  ALKPHOS 78 90  BILITOT 0.2* 0.1*  PROT 5.7* 5.8*  ALBUMIN 2.0* 2.0*   No results for input(s): LIPASE, AMYLASE in the last 168 hours. No results for input(s): AMMONIA in the last 168 hours.  Coagulation Profile: No results for input(s): INR, PROTIME in the last 168 hours.  Cardiac Enzymes: No results for input(s): CKTOTAL, CKMB, CKMBINDEX, TROPONINI in the last 168 hours.  BNP (last 3 results) No results for input(s): PROBNP in the last 8760 hours.  HbA1C: No results for input(s): HGBA1C in the last 72 hours.  CBG: Recent Labs  Lab 05/31/18 2028 06/01/18 0030 06/01/18 0457 06/01/18 0801 06/01/18 1223  GLUCAP 125* 80 93 73 108*    Lipid Profile: No results for input(s): CHOL, HDL, LDLCALC, TRIG, CHOLHDL, LDLDIRECT in the last 72 hours.  Thyroid Function Tests: No results for input(s): TSH, T4TOTAL, FREET4, T3FREE, THYROIDAB in the last 72 hours.  Anemia Panel: No results for input(s): VITAMINB12, FOLATE,  FERRITIN, TIBC, IRON, RETICCTPCT in the last 72 hours.  Urine analysis:    Component Value Date/Time   COLORURINE YELLOW 05/14/2018 2014   APPEARANCEUR CLOUDY (A) 05/14/2018 2014   LABSPEC 1.023 05/14/2018 2014   PHURINE 5.0 05/14/2018 2014   GLUCOSEU >=500 (A) 05/14/2018 2014   HGBUR MODERATE (A) 05/14/2018 2014   BILIRUBINUR NEGATIVE 05/14/2018 2014   KETONESUR 20 (A) 05/14/2018 2014   PROTEINUR 100 (A) 05/14/2018 2014   NITRITE NEGATIVE 05/14/2018 2014   LEUKOCYTESUR TRACE (A) 05/14/2018 2014    Sepsis Labs: Lactic Acid, Venous    Component Value Date/Time   LATICACIDVEN 1.5 05/27/2018 1701    MICROBIOLOGY: Recent Results (from the past 240 hour(s))  Culture, blood (routine x 2)     Status: None   Collection Time: 05/24/18  7:20 PM  Result Value Ref Range Status   Specimen Description BLOOD LEFT ANTECUBITAL  Final   Special Requests   Final    BOTTLES DRAWN AEROBIC AND ANAEROBIC Blood Culture adequate volume   Culture   Final    NO GROWTH 5 DAYS Performed at [facility], [place] [address] [place], [place] [address]    Report Status 05/29/2018 FINAL  Final  Culture, blood (routine x 2)     Status: None   Collection Time: 05/24/18 10:28 PM  Result Value Ref Range Status   Specimen Description BLOOD LEFT THUMB  Final   Special Requests   Final    BOTTLES DRAWN AEROBIC ONLY Blood Culture adequate volume   Culture   Final    NO GROWTH 5 DAYS Performed at [facility], [place] [address] [place], [place] [address]    Report Status 05/30/2018  FINAL  Final  Culture, blood (routine x 2)     Status: None   Collection Time: 05/27/18 11:54 AM  Result Value Ref Range Status   Specimen Description BLOOD LEFT ANTECUBITAL  Final   Special Requests   Final    BOTTLES DRAWN AEROBIC ONLY Blood Culture results may not be optimal due to an inadequate volume of blood received in culture bottles   Culture   Final    NO GROWTH 5 DAYS Performed at [facility],  [place] [address], [place], [place] [address]    Report Status 06/01/2018 FINAL  Final  Culture, blood (routine x 2)     Status: None   Collection Time: 05/27/18 12:04 PM  Result Value Ref Range Status   Specimen Description BLOOD LEFT ANTECUBITAL  Final   Special Requests   Final    BOTTLES DRAWN AEROBIC ONLY Blood Culture results may not be optimal due to an inadequate volume of blood received in culture bottles   Culture   Final    NO GROWTH 5 DAYS Performed at [facility], [place] [address], [place], [place] [address]    Report Status 06/01/2018 FINAL  Final  C difficile quick scan w PCR reflex     Status: None   Collection Time: 05/31/18  5:30 PM  Result Value Ref Range Status   C Diff antigen NEGATIVE NEGATIVE Final   C Diff toxin NEGATIVE NEGATIVE Final   C Diff interpretation No C. difficile detected.  Final    Comment: Performed at [facility], [place] [address], [place],  [address]    RADIOLOGY STUDIES/RESULTS: Ct Head Wo Contrast  Result Date: 05/18/2018 CLINICAL DATA:  Altered level of consciousness. Patient following commands intermittently. EXAM: CT HEAD WITHOUT CONTRAST TECHNIQUE: Contiguous axial images were obtained from the base of the skull through the vertex without intravenous contrast. COMPARISON:  None. FINDINGS: Brain: No evidence of acute infarction, hemorrhage, hydrocephalus, extra-axial collection or mass lesion/mass effect. Vascular: No hyperdense vessel or unexpected calcification. Skull: Normal. Negative for fracture or focal lesion. Sinuses/Orbits: Globes and orbits are unremarkable. There is mild mucosal thickening lining the ethmoid air cells and left sphenoid sinus with minimal right sphenoid sinus and maxillary sinus mucosal thickening. Other: None. IMPRESSION: 1. No intracranial abnormalities. 2. Sinus mucosal thickening. Electronically Signed   By: Lajean Manes M.D.   On: 05/18/2018 14:29   Ct Chest W Contrast  Result Date:  05/28/2018 CLINICAL DATA:  Chest congestion and nonproductive cough. EXAM: CT CHEST WITH CONTRAST TECHNIQUE: Multidetector CT imaging of the chest was performed during intravenous contrast administration. CONTRAST:  64mL OMNIPAQUE IOHEXOL 300 MG/ML  SOLN COMPARISON:  Chest radiographs, 05/28/2018 and prior exams. FINDINGS: Cardiovascular: Heart is normal in size. No pericardial effusion. Great vessels are normal in caliber. No aortic atherosclerosis. Mediastinum/Nodes: Mild prominence of the right thyroid lobe with areas of decreased attenuation. Suspect multiple nodules, not well-defined by CT. No neck base or axillary masses or enlarged lymph nodes. No mediastinal or hilar masses or adenopathy. Trachea is unremarkable. Mild distention of the distal esophagus versus a small hiatal hernia. There is a focus of increased attenuation within this, likely some ingested dense material. No esophageal mass or wall thickening. Lungs/Pleura: Trace right pleural effusion. Opacity in the right lower lobe, most evident in the central lower lobe. This may reflect atelectasis or infection. There is a small area of consolidation right upper lobe centered on image 49, series 5. Small amount of  dependent opacity is noted in the right upper lobe against the minor fissure, likely atelectasis. Additional areas hazy ground-glass type opacity in both lungs, mostly in the upper lobes and right middle lobe. This may be due to infection/inflammation or air trapping from airways disease. Low lung volumes accentuates these findings. No lung mass or nodule. No evidence of pulmonary edema. No pneumothorax. Upper Abdomen: No acute abnormality. Musculoskeletal: No chest wall abnormality. No acute or significant osseous findings. IMPRESSION: 1. Consolidation in the right lower lobe, which may reflect atelectasis or pneumonia. Suspect pneumonia at least as a component. There is a small area of consolidation in the right upper lobe, consistent with  an additional focus of pneumonia. Both lungs show areas of ground-glass opacity which may reflect additional infection/inflammation or be due to airways disease and air trapping. 2. No evidence of pulmonary edema. 3. Minimal right pleural effusion. Electronically Signed   By: Lajean Manes M.D.   On: 05/28/2018 11:25   Ct Abdomen Pelvis W Contrast  Result Date: 05/24/2018 CLINICAL DATA:  Postop course prolonged and complicated by ileus-on TNA-due to worsening leukocytosis. Patient underwent exploratory laparotomy on 05/16/2018 small bowel resection. Concern for ileus versus small bowel obstruction or abscess. Leukocytosis. Question of anastomotic leak. EXAM: CT ABDOMEN AND PELVIS WITH CONTRAST TECHNIQUE: Multidetector CT imaging of the abdomen and pelvis was performed using the standard protocol following bolus administration of intravenous contrast. CONTRAST:  127mL OMNIPAQUE IOHEXOL 300 MG/ML  SOLN COMPARISON:  CT of the abdomen and pelvis on 05/14/2018 FINDINGS: Lower chest: There is new consolidation at the RIGHT LOWER lobe. Subsegmental atelectasis is identified at the LEFT lung base. Hepatobiliary: Small cysts within the liver measures 1.1 centimeters. The gallbladder is present and contains faintly radiopaque calculus or calculi. Pancreas: Unremarkable. No pancreatic ductal dilatation or surrounding inflammatory changes. Spleen: Normal in size without focal abnormality. Adrenals/Urinary Tract: Adrenal glands are unremarkable. Kidneys are normal, without renal calculi, focal lesion, or hydronephrosis. Bladder is unremarkable. Stomach/Bowel: The stomach is normal in appearance. There has been interval resection of abnormal small bowel loop in the RIGHT LOWER QUADRANT. The anastomosis in the RIGHT LOWER QUADRANT appears unremarkable. There are persistent dilated loops of small bowel particularly in the central abdomen showing multiple caliber changes, suggestive of adhesions. Numerous air-fluid levels are  identified in the central small bowel loops. Although there is fluid in the mesentery and RIGHT LOWER QUADRANT, there is no evidence for anastomotic leak of contrast. No free intraperitoneal air. There are scattered colonic diverticula but no evidence for acute diverticulitis. There is central mesenteric edema. Colon is normal in caliber. Vascular/Lymphatic: No significant vascular findings are present. No enlarged abdominal or pelvic lymph nodes. Reproductive: Uterus is enlarged and contains numerous low-attenuation lesions, suggestive of degenerating fibroids. No adnexal mass. Other: No free pelvic fluid. Mesenteric ascites. Edematous changes identified within the anterior abdominal wall. Postoperative changes in the anterior abdomen. Musculoskeletal: No acute or significant osseous findings. IMPRESSION: 1. Interval resection of abnormal small bowel loop in the RIGHT LOWER QUADRANT. 2. Persistent dilated loops of small bowel in the central abdomen, suggestive of adhesions, and consistent with small bowel obstruction. 3. No evidence for anastomotic leak. 4. Mesenteric edema. 5. New consolidation at the RIGHT lung base. 6. Cholelithiasis. 7. Enlarged uterus containing numerous low-attenuation lesions, suggestive of degenerating fibroids. Electronically Signed   By: Nolon Nations M.D.   On: 05/24/2018 16:06   Dg Chest Port 1 View  Result Date: 06/01/2018 CLINICAL DATA:  Shortness of breath. EXAM:  PORTABLE CHEST 1 VIEW COMPARISON:  05/30/2018 FINDINGS: The right PICC line is stable. Persistent eventration of the right hemidiaphragm. The heart is borderline enlarged but unchanged. There is central vascular congestion but no overt pulmonary edema. Persistent bibasilar atelectasis. No definite pleural effusions. The bony thorax is intact. IMPRESSION: 1. Stable right PICC line. 2. Central vascular calcification without overt pulmonary edema. 3. Persistent bibasilar atelectasis. Electronically Signed   By: Marijo Sanes M.D.   On: 06/01/2018 10:37   Dg Chest Port 1 View  Result Date: 05/28/2018 CLINICAL DATA:  58 year old female with shortness breath. Diabetes. Subsequent encounter. EXAM: PORTABLE CHEST 1 VIEW COMPARISON:  05/27/2018 chest x-ray. 05/24/2018 CT abdomen and pelvis. FINDINGS: Persistent elevated right hemidiaphragm. Right mid to lower lobe consolidation may represent atelectasis or infiltrate and appears similar to prior exam. Central pulmonary vascular congestion. Mild cardiomegaly. No pneumothorax detected. Right PICC line tip mid superior vena cava level. No acute osseous abnormality. IMPRESSION: 1. Persistent elevated right hemidiaphragm. Right mid to lower lobe consolidation may represent atelectasis or infiltrate and appears similar to prior exam. 2. Central pulmonary vascular congestion. 3. Mild cardiomegaly. Electronically Signed   By: Genia Del M.D.   On: 05/28/2018 08:01   Dg Chest Port 1 View  Result Date: 05/27/2018 CLINICAL DATA:  Shortness of breath and cough EXAM: PORTABLE CHEST 1 VIEW COMPARISON:  Yesterday FINDINGS: Streaky opacities at the bases with elevated right diaphragm. Cardiomegaly. No Kerley lines or pneumothorax. Unremarkable right upper extremity PICC position. IMPRESSION: No change in atelectatic opacities at the bases, more extensive on the right. Electronically Signed   By: Monte Fantasia M.D.   On: 05/27/2018 09:12   Dg Chest Port 1 View  Result Date: 05/17/2018 CLINICAL DATA:  Endotracheally intubated. EXAM: PORTABLE CHEST 1 VIEW COMPARISON:  05/15/2018 FINDINGS: Endotracheal tube terminates 2.5 cm above the carina. Enteric tube courses into the abdomen with tip not imaged and side hole below the diaphragm. Right jugular catheter terminates near the superior cavoatrial junction. The cardiomediastinal silhouette is unchanged with normal heart size. Mild bibasilar opacities have slightly improved. No sizable pleural effusion or pneumothorax is identified.  IMPRESSION: Decreased, mild bibasilar atelectasis. Electronically Signed   By: Logan Bores M.D.   On: 05/17/2018 07:14   Dg Chest Port 1 View  Result Date: 05/15/2018 CLINICAL DATA:  Ventilator support EXAM: PORTABLE CHEST 1 VIEW COMPARISON:  05/14/2018 FINDINGS: Endotracheal tube tip is 2 cm above the carina. Nasogastric tube enters the stomach. Right internal jugular central line tip is in the proximal right atrium. No pneumothorax. Mild bilateral lower lobe atelectasis. IMPRESSION: Lines and tubes well positioned. Mild basilar atelectasis. Electronically Signed   By: Nelson Chimes M.D.   On: 05/15/2018 07:41   Dg Chest Portable 1 View  Result Date: 05/14/2018 CLINICAL DATA:  Abdominal pain for several days EXAM: PORTABLE CHEST 1 VIEW COMPARISON:  CT from earlier in the same day. FINDINGS: Elevation of the right hemidiaphragm is again seen. Cardiac shadow is within normal limits. Mild right basilar atelectatic changes are noted. No sizable effusion is seen. No bony abnormality is noted. IMPRESSION: Mild right basilar atelectasis. Electronically Signed   By: Inez Catalina M.D.   On: 05/14/2018 15:26   Dg Chest Port 1v Same Day  Result Date: 05/30/2018 CLINICAL DATA:  Shortness of breath, cough. EXAM: PORTABLE CHEST 1 VIEW COMPARISON:  Radiograph of May 14, 2018. CT scan of May 28, 2018. FINDINGS: Stable cardiomediastinal silhouette. Interval placement of right-sided PICC line with distal  tip in expected position of the SVC. No pneumothorax or pleural effusion is noted. No consolidative process is noted. Bony thorax is unremarkable. IMPRESSION: No acute cardiopulmonary abnormality seen. Interval placement of right-sided PICC line as described above. Electronically Signed   By: Marijo Conception, M.D.   On: 05/30/2018 11:57   Dg Chest Port 1v Same Day  Result Date: 05/27/2018 CLINICAL DATA:  Shortness of breath, history diabetes mellitus, GERD, hypertension EXAM: PORTABLE CHEST 1 VIEW COMPARISON:   Portable exam 1624 hours compared to 05/27/2018 FINDINGS: Normal heart size, mediastinal contours, and pulmonary vascularity. RIGHT arm PICC line tip projects over SVC. Chronic elevation of RIGHT diaphragm with subsegmental atelectasis at RIGHT base. Central peribronchial thickening. No infiltrate, pleural effusion or pneumothorax. IMPRESSION: Bronchitic changes with persistent RIGHT basilar atelectasis. Electronically Signed   By: Lavonia Dana M.D.   On: 05/27/2018 16:59   Dg Chest Port 1v Same Day  Result Date: 05/26/2018 CLINICAL DATA:  58 year old female with shortness of breath. Recent abdominal surgery. EXAM: PORTABLE CHEST 1 VIEW COMPARISON:  Prior chest x-ray 05/17/2018 FINDINGS: The patient has been extubated and the nasogastric tube removed. Additionally, the right IJ central venous catheter has been removed. A right upper extremity PICC has been placed. The catheter tip overlies the distal SVC. Dense band of linear airspace opacity in the right lung obscuring the right cardiac margin favored to represent right middle lobar collapse/atelectasis. Additionally, there is linear atelectasis in the left lower lobe. No evidence of pulmonary edema or pneumothorax. No acute osseous abnormality. IMPRESSION: 1. Multifocal atelectasis including right middle lobe collapse. This may be secondary to mucous plugging. 2. Well-positioned right upper extremity approach PICC. Electronically Signed   By: Jacqulynn Cadet M.D.   On: 05/26/2018 10:19   Dg Abd Portable 1v  Result Date: 05/26/2018 CLINICAL DATA:  58 year old female with abdominal distension and shortness of breath EXAM: PORTABLE ABDOMEN - 1 VIEW COMPARISON:  CT abdomen/pelvis 05/24/2018 FINDINGS: Persistent diffuse gaseous distention of multiple loops of small bowel throughout the abdomen. No massive free air visualized on this supine radiograph. Lower lumbar degenerative disc disease again noted. IMPRESSION: Persistent gaseous distention of multiple loops  of small bowel throughout the abdomen most consistent with ileus. Electronically Signed   By: Jacqulynn Cadet M.D.   On: 05/26/2018 10:18   Dg Abd Portable 1v  Result Date: 05/24/2018 CLINICAL DATA:  Initial evaluation for acute vomiting, small bowel obstruction. EXAM: PORTABLE ABDOMEN - 1 VIEW COMPARISON:  Prior CT from 05/14/2018. FINDINGS: Multiple dilated gas-filled loops of small bowel seen within the left and mid abdomen, compatible with ongoing small bowel obstruction. Loops dilated up to approximately 4 cm in diameter. Paucity of gas distally. No soft tissue mass or abnormal calcification. No appreciable free air on these limited supine views of the abdomen. IMPRESSION: Multiple persistent gas-filled dilated loops of small bowel within the left and mid abdomen, compatible with ongoing small bowel obstruction. Electronically Signed   By: Jeannine Boga M.D.   On: 05/24/2018 06:00   Ct Renal Stone Study  Result Date: 05/14/2018 CLINICAL DATA:  58 y/o F; nausea, vomiting, and abdominal pain for 3 days. EXAM: CT ABDOMEN AND PELVIS WITHOUT CONTRAST TECHNIQUE: Multidetector CT imaging of the abdomen and pelvis was performed following the standard protocol without IV contrast. COMPARISON:  None. FINDINGS: Lower chest: Platelike atelectasis in the lung bases. Hepatobiliary: No focal liver abnormality is seen. No gallstones, gallbladder wall thickening, or biliary dilatation. Pancreas: Unremarkable. No pancreatic ductal dilatation or  surrounding inflammatory changes. Spleen: Normal in size without focal abnormality. Adrenals/Urinary Tract: Adrenal glands are unremarkable. Kidneys are normal, without renal calculi, focal lesion, or hydronephrosis. Bladder is unremarkable. Stomach/Bowel: Severe diffuse small-bowel obstruction with transition at the level of the distal ileum in the right lower quadrant (series 3, image 65). Mild sigmoid diverticulosis without findings of acute diverticulitis. Otherwise  normal appearance of the colon. Vascular/Lymphatic: Aortic atherosclerosis. No enlarged abdominal or pelvic lymph nodes. Reproductive: Negative. Other: There are a few tiny foci of pneumoperitoneum at the liver hilum, anterior abdominal wall, and below the diaphragm (series 3, image 12, 24, 27). Small volume of ascites. Musculoskeletal: No fracture is seen. Lumbar spondylosis with prominent facet arthropathy. IMPRESSION: 1. Severe diffuse small bowel obstruction with transition level of distal ileum in right lower quadrant. 2. Few punctate foci of pneumoperitoneum indicating small/micro perforation. Small volume of ascites. These results were called by telephone at the time of interpretation on 05/14/2018 at 3:27 pm to Dr. Andy Gauss, who verbally acknowledged these results. Electronically Signed   By: Kristine Garbe M.D.   On: 05/14/2018 15:34   [place] Ekg Site Rite  Result Date: 05/23/2018 If Site Rite image not attached, placement could not be confirmed due to current cardiac rhythm.    LOS: 18 days   Oren Binet, MD  [facility]  If 7PM-7AM, please contact night-coverage  Please page via [email or web address]-click on MD name and type text message  06/01/2018, 1:32 PM

## 2018-06-01 NOTE — Progress Notes (Addendum)
Patient ID: Sarra Rachels, female   DOB: May 17, 1960, 58 y.o.   MRN: [id number]    16 Days Post-Op  Subjective: Pt seems in better spirits today.  States she has dyspnea with exertion.  She is trying to mobilize a little more.  Drinking her breeze, but not taking in a ton of solid food.  She states mostly someone always comes in with a breathing treatment when she gets ready to eat and then her food gets cold and she doesn't want it.  Objective: Vital signs in last 24 hours: Temp:  [99.2 F (37.3 C)-99.6 F (37.6 C)] 99.3 F (37.4 C) (02/14 0502) Pulse Rate:  [101-109] 101 (02/14 0502) Resp:  [15-19] 18 (02/14 0502) BP: (132-145)/(74-83) 141/74 (02/14 0502) SpO2:  [92 %-100 %] 92 % (02/14 0827) Weight:  [627.0 kg] 114.2 kg (02/14 0500) Last BM Date: 05/31/18  Intake/Output from previous day: 02/13 0701 - 02/14 0700 In: -  Out: 1500 [Urine:1400; Stool:100] Intake/Output this shift: No intake/output data recorded.  PE: Gen: sitting on the edge of the bed Heart: regular, slightly tachy Lungs: still with coarse BS at her bases Abd: soft, appropriately tender, obese, midline wound with VAC in place.  Will see later today at [facility] change.     Lab Results:  Recent Labs    05/30/18 0342 06/01/18 0404  WBC 5.9 6.4  HGB 7.8* 7.7*  HCT 24.7* 25.5*  PLT 271 278   BMET Recent Labs    05/30/18 0342 05/31/18 0409  NA 141 140  K 3.7 3.6  CL 111 109  CO2 26 24  GLUCOSE 130* 137*  BUN 15 15  CREATININE 0.75 0.80  CALCIUM 7.6* 7.8*   PT/INR No results for input(s): LABPROT, INR in the last 72 hours. CMP     Component Value Date/Time   NA 140 05/31/2018 0409   NA 144 12/25/2014 1536   K 3.6 05/31/2018 0409   CL 109 05/31/2018 0409   CO2 24 05/31/2018 0409   GLUCOSE 137 (H) 05/31/2018 0409   BUN 15 05/31/2018 0409   BUN 9 12/25/2014 1536   CREATININE 0.80 05/31/2018 0409   CREATININE 0.69 05/25/2017 1150   CALCIUM 7.8 (L) 05/31/2018 0409   PROT 5.8 (L) 05/31/2018 0409     PROT 6.7 12/25/2014 1536   ALBUMIN 2.0 (L) 05/31/2018 0409   ALBUMIN 4.4 12/25/2014 1536   AST 94 (H) 05/31/2018 0409   ALT 73 (H) 05/31/2018 0409   ALKPHOS 90 05/31/2018 0409   BILITOT 0.1 (L) 05/31/2018 0409   BILITOT 0.3 12/25/2014 1536   GFRNONAA >60 05/31/2018 0409   GFRNONAA 78 08/25/2016 1100   GFRAA >60 05/31/2018 0409   GFRAA >89 08/25/2016 1100   Lipase     Component Value Date/Time   LIPASE 37 05/14/2018 2014       Studies/Results: No results found.  Anti-infectives: Anti-infectives (From admission, onward)   Start     Dose/Rate Route Frequency Ordered Stop   05/30/18 1830  Ampicillin-Sulbactam (UNASYN) 3 g in sodium chloride 0.9 % 100 mL IVPB  Status:  Discontinued     3 g 200 mL/hr over 30 Minutes Intravenous Every 6 hours 05/30/18 1810 05/31/18 0823   05/30/18 1530  amoxicillin-clavulanate (AUGMENTIN) 875-125 MG per tablet 1 tablet  Status:  Discontinued     1 tablet Oral Every 12 hours 05/30/18 1447 05/30/18 1810   05/28/18 0500  vancomycin (VANCOCIN) 1,250 mg in sodium chloride 0.9 % 250 mL IVPB  Status:  Discontinued     1,250 mg 166.7 mL/hr over 90 Minutes Intravenous Every 12 hours 05/27/18 1612 05/30/18 0936   05/27/18 1700  vancomycin (VANCOCIN) 2,500 mg in sodium chloride 0.9 % 500 mL IVPB     2,500 mg 250 mL/hr over 120 Minutes Intravenous  Once 05/27/18 1612 05/27/18 1922   05/27/18 1600  meropenem (MERREM) 2 g in sodium chloride 0.9 % 100 mL IVPB  Status:  Discontinued     2 g 200 mL/hr over 30 Minutes Intravenous Every 8 hours 05/27/18 1544 05/30/18 1442   05/26/18 1300  Ampicillin-Sulbactam (UNASYN) 3 g in sodium chloride 0.9 % 100 mL IVPB  Status:  Discontinued     3 g 200 mL/hr over 30 Minutes Intravenous Every 6 hours 05/26/18 1234 05/27/18 1538   05/19/18 1400  vancomycin (VANCOCIN) 1,250 mg in sodium chloride 0.9 % 250 mL IVPB  Status:  Discontinued     1,250 mg 166.7 mL/hr over 90 Minutes Intravenous Every 24 hours 05/18/18 1343  05/19/18 1244   05/19/18 1400  meropenem (MERREM) 2 g in sodium chloride 0.9 % 100 mL IVPB     2 g 200 mL/hr over 30 Minutes Intravenous Every 8 hours 05/19/18 1244 05/26/18 2359   05/18/18 1600  cefTRIAXone (ROCEPHIN) 2 g in sodium chloride 0.9 % 100 mL IVPB  Status:  Discontinued     2 g 200 mL/hr over 30 Minutes Intravenous Every 24 hours 05/18/18 1326 05/19/18 1244   05/18/18 1400  vancomycin (VANCOCIN) 2,000 mg in sodium chloride 0.9 % 500 mL IVPB     2,000 mg 250 mL/hr over 120 Minutes Intravenous  Once 05/18/18 1343 05/18/18 1640   05/15/18 2333  ceFEPIme (MAXIPIME) 2 g in sodium chloride 0.9 % 100 mL IVPB  Status:  Discontinued     2 g 200 mL/hr over 30 Minutes Intravenous Every 8 hours 05/15/18 2226 05/18/18 1326   05/15/18 1500  ceFEPIme (MAXIPIME) 1 g in sodium chloride 0.9 % 100 mL IVPB  Status:  Discontinued     1 g 200 mL/hr over 30 Minutes Intravenous Every 24 hours 05/14/18 1512 05/15/18 2226   05/15/18 0000  anidulafungin (ERAXIS) 100 mg in sodium chloride 0.9 % 100 mL IVPB  Status:  Discontinued     100 mg 78 mL/hr over 100 Minutes Intravenous Every 24 hours 05/14/18 1728 05/15/18 1159   05/14/18 1800  anidulafungin (ERAXIS) 200 mg in sodium chloride 0.9 % 200 mL IVPB     200 mg 78 mL/hr over 200 Minutes Intravenous  Once 05/14/18 1751 05/14/18 2138   05/14/18 1745  vancomycin (VANCOCIN) 2,000 mg in sodium chloride 0.9 % 500 mL IVPB  Status:  Discontinued     2,000 mg 250 mL/hr over 120 Minutes Intravenous  Once 05/14/18 1741 05/15/18 1159   05/14/18 1741  vancomycin variable dose per unstable renal function (pharmacist dosing)  Status:  Discontinued      Does not apply See admin instructions 05/14/18 1741 05/15/18 1159   05/14/18 1515  ceFEPIme (MAXIPIME) 2 g in sodium chloride 0.9 % 100 mL IVPB     2 g 200 mL/hr over 30 Minutes Intravenous  Once 05/14/18 1512 05/14/18 1649   05/14/18 1515  metroNIDAZOLE (FLAGYL) IVPB 500 mg  Status:  Discontinued     500 mg 100  mL/hr over 60 Minutes Intravenous Every 8 hours 05/14/18 1512 05/19/18 1244       Assessment/Plan DKA- per medicine, resolved Sepsis- secondary to below intra-abdominal problem,  resolved Right-side PNA- only pulls 500 right now.  rediscussed importance of IS and she seemed more inclined to do it more than previously.  SBO,Pneumoperitoneum, intussusception with necrosis & perforation -S/P ex lap, small bowel resection, closure with abthera vac, Dr. Grandville Silos, 01/27 POD 17 -S/PExploratorylaparotomy with small bowel anastomosis and closure of abdominal fascia, Dr. Brantley Stage, 01/29POD 15 - on regular diet, but not eating much.  Drinking her breeze well.  Still on weaned dose of TNA.  Will get a calorie count, but plan to DC TNA hopefully by tomorrow. - wound vac changes MWF -diarrhea resolved.  c diff negative -mobilize, IS  DIY:MEBR diet, TPN until pt takes more PO, calorie count in progress VTE: SCD's, startedlovenox 01/30 AX:ENMM/HWKGSU for HCAP, stopped 2/13 Foley:DC02/05 Follow up:Dr. Grandville Silos  Plan- will get [facility] for VAC at home.   LOS: 18 days    Henreitta Cea , [facility] 06/01/2018, 10:30 AM Pager: [phone]

## 2018-06-01 NOTE — Progress Notes (Signed)
Calorie Count Note  48 hour calorie count ordered.  Diet: Soft Supplements: Boost Breeze QID  See full assessment dated 2/13.  Spoke with pt about importance of nutrition post-op.  She declines all snacks offered but will continue Boost Breeze.  She states that she is eating only bites at meal times. Encouraged pt to eat more at meals and drink supplements in between.  TPN: decreased to 45 ml/hr (1060 kcal and 63 grams protein)  Maylon Peppers RD, LDN, CNSC [phone] Pager [phone] After Hours Pager

## 2018-06-01 NOTE — Progress Notes (Signed)
[place] CONSULT NOTE   Pharmacy Consult for TPN Indication: ileus  Patient Measurements: Height: 5\' 3"  (160 cm) Weight: 251 lb 12.3 oz (114.2 kg) IBW/kg (Calculated) : 52.4 TPN AdjBW (KG): 68.8 Body mass index is 44.6 kg/m. Usual Weight: 117.9 kg  Assessment:  58 y/o female presenting to the ED with 3 days of vomiting and abdominal pain found to be in DKA. CT showed SBO. Noted SBO, pneumoperitoneum, intussusception with necrosis and performation and was taken to the OR emergently for ex lap, SBR left in discontinuity, and abthera VAC on 1/27. Taken back to the OR on 1/29 for ex lap with SB anastomosis and fascia closure. High risk for refeeding.   GI: SBO, pneumoperitoneum, intussusception with necrosis and perforation. Patient reporting nausea even with water. Albumin 2.1>2, prealbumin 10.1>14.4>17.8,  +flatus and  LBM 2/12. PPI IV>po for GERD. Po Intake 0 documented last 24hr of soft diet. Drains 59ml. C/o abdominal pain and loose BM. C/o food taste.  - CT scan 2/6 showing ileus vs SBO, anastomosis intact, no abscess Ileus improving - continuing CLD at this point  Endo: hx DM, CBGs 73-150 Insulin requirements in the past 24 hours: 9 units SSI, 60 units in TPN, 24 units of Lantus  Lytes: K 3.6, Mag 2.2, Phos 1.9 > 2.6 Renal: AKI resolved, SCr stable Pulm: RA (Flonase,Mucinex, duonebs) Cards: h/o HLD, HTN, HR tachy Hepatobil: LFTs up acutely this AM 36>94, ALT 28>73, but Tbili WNL, TG 139 Neuro: Effexor. Can be uncooperative. SW:NIOE 101.7, Tc 99.9, WBC 5.9 down. Merrem>>Unasyn. 1/27: Clostridium [place] 1/27: Peritoneal fluid: Enterococcus faecium, Clostridium  TPN Access: double lumen CVC placed 05/14/18 TPN start date: 05/19/18  Nutritional Goals (per RD recommendation on 05/19/18): KCal: 2100-2300 Protein: 116-128 g Fluid: >1.6L  Current Nutrition:  TPN CLD  Plan:  Diet advanced to soft  Decrease TPN to 1ml/hr to promote  appetite - This TPN provides 63 g of protein, 151 g of dextrose, and 29 g of lipids which provides 1060 kCals per day, meeting 50% of patient needs -Electrolytes in TPN: Increase K+, others to standard -Continue MVI, trace elements in TPN -Reduce insulin in TPN to 40 units -Continue resistant SSI Q4h and Lantus 24 units  -Monitor how patient tolerates CLD and ability to wean TPN   Harvel Quale 06/01/2018, 10:20 AM

## 2018-06-02 LAB — TYPE AND SCREEN
ABO/RH(D): O POS
Antibody Screen: NEGATIVE
Unit division: 0

## 2018-06-02 LAB — BASIC METABOLIC PANEL
Anion gap: 9 (ref 5–15)
BUN: 12 mg/dL (ref 6–20)
CO2: 23 mmol/L (ref 22–32)
Calcium: 7.8 mg/dL — ABNORMAL LOW (ref 8.9–10.3)
Chloride: 111 mmol/L (ref 98–111)
Creatinine, Ser: 0.75 mg/dL (ref 0.44–1.00)
GFR calc Af Amer: >60 mL/min (ref >60)
GFR calc non Af Amer: >60 mL/min (ref >60)
Glucose, Bld: 85 mg/dL (ref 70–99)
Potassium: 3.3 mmol/L — ABNORMAL LOW (ref 3.5–5.1)
Sodium: 143 mmol/L (ref 135–145)

## 2018-06-02 LAB — BPAM RBC
BLOOD PRODUCT EXPIRATION DATE: 202003112359
ISSUE DATE / TIME: 202002141658
Unit Type and Rh: 5100

## 2018-06-02 LAB — CBC
HCT: 28.7 % — ABNORMAL LOW (ref 36.0–46.0)
Hemoglobin: 8.8 g/dL — ABNORMAL LOW (ref 12.0–15.0)
MCH: 25.8 pg — ABNORMAL LOW (ref 26.0–34.0)
MCHC: 30.7 g/dL (ref 30.0–36.0)
MCV: 84.2 fL (ref 80.0–100.0)
Platelets: 290 10*3/uL (ref 150–400)
RBC: 3.41 MIL/uL — ABNORMAL LOW (ref 3.87–5.11)
RDW: 16.4 % — ABNORMAL HIGH (ref 11.5–15.5)
WBC: 7 10*3/uL (ref 4.0–10.5)
nRBC: 0 % (ref 0.0–0.2)

## 2018-06-02 LAB — GLUCOSE, CAPILLARY
Glucose-Capillary: 78 mg/dL (ref 70–99)
Glucose-Capillary: 106 mg/dL — ABNORMAL HIGH (ref 70–99)
Glucose-Capillary: 98 mg/dL (ref 70–99)
Glucose-Capillary: 89 mg/dL (ref 70–99)
Glucose-Capillary: 91 mg/dL (ref 70–99)

## 2018-06-02 MED ORDER — INSULIN ASPART 100 UNIT/ML ~~LOC~~ SOLN: 0.0000 [IU] | Freq: Three times a day (TID) | SUBCUTANEOUS | Status: DC

## 2018-06-02 MED ORDER — DEXTROSE 10 % IV SOLN: INTRAVENOUS | Status: DC

## 2018-06-02 MED ORDER — POTASSIUM CHLORIDE CRYS ER 20 MEQ PO TBCR
40.0000 meq | EXTENDED_RELEASE_TABLET | Freq: Once | ORAL | Status: AC
  Administered 2018-06-02: 40 meq via ORAL
  Filled 2018-06-02: qty 2

## 2018-06-02 MED ORDER — POTASSIUM CHLORIDE CRYS ER 10 MEQ PO TBCR
EXTENDED_RELEASE_TABLET | ORAL | Status: AC
  Filled 2018-06-02: qty 4

## 2018-06-02 MED ORDER — BOOST PLUS PO LIQD
237.0000 mL | Freq: Three times a day (TID) | ORAL | Status: DC
  Administered 2018-06-02 – 2018-06-04 (×2): 237 mL via ORAL
  Filled 2018-06-02 (×8): qty 237

## 2018-06-02 MED ORDER — IPRATROPIUM-ALBUTEROL 0.5-2.5 (3) MG/3ML IN SOLN
3.0000 mL | Freq: Two times a day (BID) | RESPIRATORY_TRACT | Status: DC
  Administered 2018-06-02: 3 mL via RESPIRATORY_TRACT
  Filled 2018-06-02: qty 3

## 2018-06-02 MED ORDER — TRAVASOL 10 % IV SOLN
INTRAVENOUS | Status: DC
  Filled 2018-06-02: qty 637.2

## 2018-06-02 NOTE — Progress Notes (Signed)
Occupational Therapy Treatment Patient Details Name: Wendy [name] MRN: [id number] DOB: Jul 29, 1960 Today's Date: 06/02/2018    History of present illness 58 year old female who presented with 3-day history of abdominal pain, emesis, anorexia, and polyuria for the past 3 days.  Labs notable hyperglycemia 771 with anion gap of 21.  CT abdomen pelvis showed small bowel obstruction with pneumoperitoneum and she underwent ex lap with small bowel resection.   OT comments  Pt progressing towards goals. Reluctant to work with therapy this session "I can do all of that for myself" however - Pt continues to require mod A for LB ADL due to pain in abdomen and limited ROM. Pt also fatigues quickly with SOB - did not want OT to take O2 levels. Pt was min guard for in room mobility to bathroom, for toilet transfers and peri care. She was min guard for standing grooming tasks at sink. Pt does push IV pole for balance. OT will continue to follow acutely. Next session to focus on energy conservation in preparation of going home (discharge updated)   Follow Up Recommendations  Home health OT;Supervision/Assistance - 24 hour(initially)    Equipment Recommendations  3 in 1 bedside commode    Recommendations for Other Services      Precautions / Restrictions Precautions Precautions: Fall;Other (comment) Precaution Comments: wound vac Restrictions Weight Bearing Restrictions: No       Mobility Bed Mobility Overal bed mobility: Modified Independent             General bed mobility comments: increased time, no assist required, HOB elevated  Transfers Overall transfer level: Needs assistance Equipment used: (pushes IV pole) Transfers: Sit to/from Stand Sit to Stand: Supervision         General transfer comment: no assist needed    Balance Overall balance assessment: Needs assistance Sitting-balance support: Feet supported;No upper extremity supported Sitting balance-Leahy Scale: Good      Standing balance support: Single extremity supported;During functional activity Standing balance-Leahy Scale: Fair Standing balance comment: pushes IV pole                           ADL either performed or assessed with clinical judgement   ADL Overall ADL's : Needs assistance/impaired     Grooming: Wash/dry hands;Min guard;Standing Grooming Details (indicate cue type and reason): sink level     Lower Body Bathing: Moderate assistance;Sit to/from stand Lower Body Bathing Details (indicate cue type and reason): educated on long handle sponge         Toilet Transfer: Min guard;Ambulation Toilet Transfer Details (indicate cue type and reason): pushes IV pole, able to perform without physical assist Toileting- Clothing Manipulation and Hygiene: Supervision/safety;Sit to/from Nurse, children's Details (indicate cue type and reason): I am not getting in my shower anyways Functional mobility during ADLs: Min guard(pushes IV pole) General ADL Comments: educated Pt  that she should have staff with her for transfers and in room mobilty     Vision       Perception     Praxis      Cognition Arousal/Alertness: Awake/alert Behavior During Therapy: Flat affect Overall Cognitive Status: Within Functional Limits for tasks assessed                                          Exercises     Shoulder  Instructions       General Comments      Pertinent Vitals/ Pain       Pain Assessment: Faces Faces Pain Scale: Hurts a little bit Pain Location: abdomen Pain Descriptors / Indicators: Guarding;Grimacing;Sore Pain Intervention(s): Limited activity within patient's tolerance;Monitored during session;Repositioned  Home Living                                          Prior Functioning/Environment              Frequency  Min 2X/week        Progress Toward Goals  OT Goals(current goals can now be found in the care  plan section)  Progress towards OT goals: Progressing toward goals  Acute Rehab OT Goals Patient Stated Goal: Return to independence OT Goal Formulation: With patient Time For Goal Achievement: 06/04/18 Potential to Achieve Goals: Good  Plan Discharge plan needs to be updated;Frequency remains appropriate    Co-evaluation                 AM-PAC OT "6 Clicks" Daily Activity     Outcome Measure   Help from another person eating meals?: None Help from another person taking care of personal grooming?: A Little Help from another person toileting, which includes using toliet, bedpan, or urinal?: A Little Help from another person bathing (including washing, rinsing, drying)?: A Lot Help from another person to put on and taking off regular upper body clothing?: A Little Help from another person to put on and taking off regular lower body clothing?: A Lot 6 Click Score: 17    End of Session    OT Visit Diagnosis: Unsteadiness on feet (R26.81);Other abnormalities of gait and mobility (R26.89);Muscle weakness (generalized) (M62.81);Pain Pain - part of body: (abdomen)   Activity Tolerance Patient tolerated treatment well;Patient limited by fatigue   Patient Left in bed;with call bell/phone within reach;with bed alarm set   Nurse Communication Mobility status;Other (comment)(Pt interested in bath)        Time: 9643-8381 OT Time Calculation (min): 21 min  Charges: OT General Charges $OT Visit: 1 Visit OT Treatments $Self Care/Home Management : 8-22 mins Hulda Humphrey OTR/L Acute Rehabilitation Services Pager: [phone] Office: [place] 06/02/2018, 4:53 PM

## 2018-06-02 NOTE — Progress Notes (Signed)
17 Days Post-Op   Subjective/Chief Complaint: none  Patient doing well.  Her diet is improving but she continues to be on TNA for now.   Objective: Vital signs in last 24 hours: Temp:  [98.2 F (36.8 C)-99.2 F (37.3 C)] 98.6 F (37 C) (02/15 0526) Pulse Rate:  [101-119] 101 (02/15 0526) Resp:  [16-18] 18 (02/15 0526) BP: (124-155)/(73-84) 136/81 (02/15 0526) SpO2:  [95 %-100 %] 95 % (02/15 0526) Last BM Date: 05/31/18  Intake/Output from previous day: 02/14 0701 - 02/15 0700 In: 1125 [P.O.:240; I.V.:540; Blood:345] Out: 1 [Urine:1] Intake/Output this shift: No intake/output data recorded.  Incision/Wound: Wound VAC in place and functioning.  Lab Results:  Recent Labs    06/01/18 0404 06/02/18 0401  WBC 6.4 7.0  HGB 7.7* 8.8*  HCT 25.5* 28.7*  PLT 278 290   BMET Recent Labs    05/31/18 0409 06/02/18 0401  NA 140 143  K 3.6 3.3*  CL 109 111  CO2 24 23  GLUCOSE 137* 85  BUN 15 12  CREATININE 0.80 0.75  CALCIUM 7.8* 7.8*   PT/INR No results for input(s): LABPROT, INR in the last 72 hours. ABG No results for input(s): PHART, HCO3 in the last 72 hours.  Invalid input(s): PCO2, PO2  Studies/Results: Dg Chest Port 1 View  Result Date: 06/01/2018 CLINICAL DATA:  Shortness of breath. EXAM: PORTABLE CHEST 1 VIEW COMPARISON:  05/30/2018 FINDINGS: The right PICC line is stable. Persistent eventration of the right hemidiaphragm. The heart is borderline enlarged but unchanged. There is central vascular congestion but no overt pulmonary edema. Persistent bibasilar atelectasis. No definite pleural effusions. The bony thorax is intact. IMPRESSION: 1. Stable right PICC line. 2. Central vascular calcification without overt pulmonary edema. 3. Persistent bibasilar atelectasis. Electronically Signed   By: Marijo Sanes M.D.   On: 06/01/2018 10:37   Vas [place] Lower Extremity Venous (dvt)  Result Date: 06/01/2018  Lower Venous Study Indications: Edema.  Performing Technologist:  June Leap RDMS, RVT  Examination Guidelines: A complete evaluation includes B-mode imaging, spectral Doppler, color Doppler, and power Doppler as needed of all accessible portions of each vessel. Bilateral testing is considered an integral part of a complete examination. Limited examinations for reoccurring indications may be performed as noted.  Right Venous Findings: +---------+---------------+---------+-----------+----------+-------+          CompressibilityPhasicitySpontaneityPropertiesSummary +---------+---------------+---------+-----------+----------+-------+ CFV      Full           Yes      Yes                          +---------+---------------+---------+-----------+----------+-------+ SFJ      Full                                                 +---------+---------------+---------+-----------+----------+-------+ FV Prox  Full                                                 +---------+---------------+---------+-----------+----------+-------+ FV Mid   Full                                                 +---------+---------------+---------+-----------+----------+-------+  FV DistalFull                                                 +---------+---------------+---------+-----------+----------+-------+ PFV      Full                                                 +---------+---------------+---------+-----------+----------+-------+ POP      Full           Yes      Yes                          +---------+---------------+---------+-----------+----------+-------+ PTV      Full                                                 +---------+---------------+---------+-----------+----------+-------+ PERO     Full                                                 +---------+---------------+---------+-----------+----------+-------+  Left Venous Findings: +---------+---------------+---------+-----------+----------+-------+           CompressibilityPhasicitySpontaneityPropertiesSummary +---------+---------------+---------+-----------+----------+-------+ CFV      Full           Yes      Yes                          +---------+---------------+---------+-----------+----------+-------+ SFJ      Full                                                 +---------+---------------+---------+-----------+----------+-------+ FV Prox  Full                                                 +---------+---------------+---------+-----------+----------+-------+ FV Mid   Full                                                 +---------+---------------+---------+-----------+----------+-------+ FV DistalFull                                                 +---------+---------------+---------+-----------+----------+-------+ PFV      Full                                                 +---------+---------------+---------+-----------+----------+-------+  POP      Full           Yes      Yes                          +---------+---------------+---------+-----------+----------+-------+ PTV      Full                                                 +---------+---------------+---------+-----------+----------+-------+ PERO     Full                                                 +---------+---------------+---------+-----------+----------+-------+    Summary: Right: There is no evidence of deep vein thrombosis in the lower extremity. No cystic structure found in the popliteal fossa. Left: There is no evidence of deep vein thrombosis in the lower extremity. No cystic structure found in the popliteal fossa.  *See table(s) above for measurements and observations.    Preliminary     Anti-infectives: Anti-infectives (From admission, onward)   Start     Dose/Rate Route Frequency Ordered Stop   05/30/18 1830  Ampicillin-Sulbactam (UNASYN) 3 g in sodium chloride 0.9 % 100 mL IVPB  Status:  Discontinued     3 g 200  mL/hr over 30 Minutes Intravenous Every 6 hours 05/30/18 1810 05/31/18 0823   05/30/18 1530  amoxicillin-clavulanate (AUGMENTIN) 875-125 MG per tablet 1 tablet  Status:  Discontinued     1 tablet Oral Every 12 hours 05/30/18 1447 05/30/18 1810   05/28/18 0500  vancomycin (VANCOCIN) 1,250 mg in sodium chloride 0.9 % 250 mL IVPB  Status:  Discontinued     1,250 mg 166.7 mL/hr over 90 Minutes Intravenous Every 12 hours 05/27/18 1612 05/30/18 0936   05/27/18 1700  vancomycin (VANCOCIN) 2,500 mg in sodium chloride 0.9 % 500 mL IVPB     2,500 mg 250 mL/hr over 120 Minutes Intravenous  Once 05/27/18 1612 05/27/18 1922   05/27/18 1600  meropenem (MERREM) 2 g in sodium chloride 0.9 % 100 mL IVPB  Status:  Discontinued     2 g 200 mL/hr over 30 Minutes Intravenous Every 8 hours 05/27/18 1544 05/30/18 1442   05/26/18 1300  Ampicillin-Sulbactam (UNASYN) 3 g in sodium chloride 0.9 % 100 mL IVPB  Status:  Discontinued     3 g 200 mL/hr over 30 Minutes Intravenous Every 6 hours 05/26/18 1234 05/27/18 1538   05/19/18 1400  vancomycin (VANCOCIN) 1,250 mg in sodium chloride 0.9 % 250 mL IVPB  Status:  Discontinued     1,250 mg 166.7 mL/hr over 90 Minutes Intravenous Every 24 hours 05/18/18 1343 05/19/18 1244   05/19/18 1400  meropenem (MERREM) 2 g in sodium chloride 0.9 % 100 mL IVPB     2 g 200 mL/hr over 30 Minutes Intravenous Every 8 hours 05/19/18 1244 05/26/18 2359   05/18/18 1600  cefTRIAXone (ROCEPHIN) 2 g in sodium chloride 0.9 % 100 mL IVPB  Status:  Discontinued     2 g 200 mL/hr over 30 Minutes Intravenous Every 24 hours 05/18/18 1326 05/19/18 1244   05/18/18 1400  vancomycin (VANCOCIN) 2,000 mg in sodium chloride 0.9 % 500 mL IVPB  2,000 mg 250 mL/hr over 120 Minutes Intravenous  Once 05/18/18 1343 05/18/18 1640   05/15/18 2333  ceFEPIme (MAXIPIME) 2 g in sodium chloride 0.9 % 100 mL IVPB  Status:  Discontinued     2 g 200 mL/hr over 30 Minutes Intravenous Every 8 hours 05/15/18 2226  05/18/18 1326   05/15/18 1500  ceFEPIme (MAXIPIME) 1 g in sodium chloride 0.9 % 100 mL IVPB  Status:  Discontinued     1 g 200 mL/hr over 30 Minutes Intravenous Every 24 hours 05/14/18 1512 05/15/18 2226   05/15/18 0000  anidulafungin (ERAXIS) 100 mg in sodium chloride 0.9 % 100 mL IVPB  Status:  Discontinued     100 mg 78 mL/hr over 100 Minutes Intravenous Every 24 hours 05/14/18 1728 05/15/18 1159   05/14/18 1800  anidulafungin (ERAXIS) 200 mg in sodium chloride 0.9 % 200 mL IVPB     200 mg 78 mL/hr over 200 Minutes Intravenous  Once 05/14/18 1751 05/14/18 2138   05/14/18 1745  vancomycin (VANCOCIN) 2,000 mg in sodium chloride 0.9 % 500 mL IVPB  Status:  Discontinued     2,000 mg 250 mL/hr over 120 Minutes Intravenous  Once 05/14/18 1741 05/15/18 1159   05/14/18 1741  vancomycin variable dose per unstable renal function (pharmacist dosing)  Status:  Discontinued      Does not apply See admin instructions 05/14/18 1741 05/15/18 1159   05/14/18 1515  ceFEPIme (MAXIPIME) 2 g in sodium chloride 0.9 % 100 mL IVPB     2 g 200 mL/hr over 30 Minutes Intravenous  Once 05/14/18 1512 05/14/18 1649   05/14/18 1515  metroNIDAZOLE (FLAGYL) IVPB 500 mg  Status:  Discontinued     500 mg 100 mL/hr over 60 Minutes Intravenous Every 8 hours 05/14/18 1512 05/19/18 1244      Assessment/Plan: s/p Procedure(s): EXPLORATORY LAPAROTOMY with reanatomosis and fascia closure with wound vacum application (N/A)  DKA- per medicine, resolved Sepsis- secondary to below intra-abdominal problem, resolved Right-side PNA- only pulls 500 right now.  rediscussed importance of IS and she seemed more inclined to do it more than previously.  SBO,Pneumoperitoneum, intussusception with necrosis & perforation -S/P ex lap, small bowel resection, closure with abthera vac, Dr. Grandville Silos, 01/27 POD 17 -S/PExploratorylaparotomy with small bowel anastomosis and closure of abdominal fascia, Dr. Brantley Stage, 01/29POD 15 - on  regular diet, but not eating much.  Drinking her breeze well.  Still on weaned dose of TNA.  Will get a calorie count, but plan to DC TNA hopefully by tomorrow. - wound vac changes MWF -diarrhea resolved.  c diff negative -mobilize, IS  ZOX:WRUE diet, TPNuntil pt takes more PO, calorie count in progress VTE: SCD's, startedlovenox 01/30 AV:[id number]/[id number] for HCAP, stopped 2/13 Foley:DC02/05 Follow up:Dr. Grandville Silos  Plan- will get [facility] for VAC at home    LOS: 19 days    Marcello Moores A Sadrac Zeoli 06/02/2018

## 2018-06-02 NOTE — Progress Notes (Signed)
PROGRESS NOTE        PATIENT DETAILS Name: Wendy [name] Age: 58 y.o. Sex: female Date of Birth: 06-Nov-1960 Admit Date: 05/14/2018 Admitting Physician Rush Farmer, MD [id number], Drue Stager, MD  Brief Narrative: Patient is a 58 y.o. female with history of DM-2, pretension, GERD, morbid obesity who presented with a 3-day history of vomiting and abdominal pain-found to have sepsis secondary to small bowel obstruction and small perforation, along with DKA.  She was started on IV insulin, empiric antimicrobial therapy-General surgery was consulted-and patient was admitted to the ICU under the PCCM service.  Hospital course lately has been complicated by development of ileus, pneumonia, and some intermittent diarrhea (C. difficile PCR negative)  Significant Events: 1/27 Admit > OR for ex lap with small bowel resection> ICU 1/29 OR for anastomosis and closure 2/1 Extubated 2/2 Hypoactive delirium, worsening respiratory status 2/8>>Right PNA  Subjective:  Patient in bed, appears comfortable, denies any headache, no fever, no chest pain or pressure, no shortness of breath , no abdominal pain. No focal weakness.   Assessment/Plan:  Acute hypoxic respiratory failure: Extubated on 2/1 (intubated for surgery) continue to wean to RA as able.  Septic shock secondary to peritonitis with bowel obstruction along with Clostridium bacteremia: Sepsis pathophysiology has resolved, blood cultures positive for Clostridium species.  Peritoneal fluid cultures positive for enterococcus and Clostridium as well.  Has completed a course of meropenem till 2/8.   Small bowel obstruction with perforation s/p resection and closure of the abdominal fascia with prolonged postop course due to ileus: Slowly improving-tolerating soft diet-currently on TNA but not much appetite, will try to hold her TNA on 06/02/2018 12 noon for 24 hours to see if it stimulates her appetite.  Added protein  supplementation as well.  Will closely monitor.    To continue with wound VAC changes Monday Wednesday Friday, case management has already arranged for home RN and wound VAC.  Fever secondary to right-sided pneumonia: Fever finally resolved-no fever overnight.  Highly likely that pneumonia was secondary to- poor respiratory effort/cough effort-and refusal to be mobilized/ambulated.  Per nursing staff-patient has refused chest PT multiple times.  All antimicrobial therapy was discontinued on 2/13-slowly improving-now afebrile overnight.  Have counseled patient extensively over the past week or so regarding importance of chest PT, mobilization/ambulation, incentive spirometry and flutter valve-reemphasized today.  Monitor off antimicrobial therapy.  Anemia: Secondary to acute/critical illness-no evidence of blood loss.  Claims that over the past few days-she has had worsening exertional dyspnea-although this exertional dyspnea is multifactorial secondary to obesity, deconditioning-we will transfuse 1 unit of PRBC to see if symptoms improve.  Repeat CBC tomorrow.   Hypernatremia: Resolved  Type II DM with uncontrolled hyperglycemia: Had severe hyperglycemia with probable DKA on admission-CBGs currently stable with 20 points of Lantus and SSI.  Continue to follow.    Hypertension: Blood pressure relatively well controlled-continue to monitor off antihypertensives-once oral intake has resumed we can resume her blood pressure medications.    GERD: Continue PPI  Dyslipidemia: Resume statin when oral intake is further stabilized  Debility/deconditioning with morbid obesity: Secondary to acute illness/critical illness-discharge in the next few days to CIR or home with home health.  Follow with PCP for weight loss.   DVT Prophylaxis: Prophylactic Lovenox   Code Status: Full code   Family Communication: None at bedside  Disposition Plan: Remain  inpatient-suspect CIR on discharge  Antimicrobial  agents: Anti-infectives (From admission, onward)   Start     Dose/Rate Route Frequency Ordered Stop   05/30/18 1830  Ampicillin-Sulbactam (UNASYN) 3 g in sodium chloride 0.9 % 100 mL IVPB  Status:  Discontinued     3 g 200 mL/hr over 30 Minutes Intravenous Every 6 hours 05/30/18 1810 05/31/18 0823   05/30/18 1530  amoxicillin-clavulanate (AUGMENTIN) 875-125 MG per tablet 1 tablet  Status:  Discontinued     1 tablet Oral Every 12 hours 05/30/18 1447 05/30/18 1810   05/28/18 0500  vancomycin (VANCOCIN) 1,250 mg in sodium chloride 0.9 % 250 mL IVPB  Status:  Discontinued     1,250 mg 166.7 mL/hr over 90 Minutes Intravenous Every 12 hours 05/27/18 1612 05/30/18 0936   05/27/18 1700  vancomycin (VANCOCIN) 2,500 mg in sodium chloride 0.9 % 500 mL IVPB     2,500 mg 250 mL/hr over 120 Minutes Intravenous  Once 05/27/18 1612 05/27/18 1922   05/27/18 1600  meropenem (MERREM) 2 g in sodium chloride 0.9 % 100 mL IVPB  Status:  Discontinued     2 g 200 mL/hr over 30 Minutes Intravenous Every 8 hours 05/27/18 1544 05/30/18 1442   05/26/18 1300  Ampicillin-Sulbactam (UNASYN) 3 g in sodium chloride 0.9 % 100 mL IVPB  Status:  Discontinued     3 g 200 mL/hr over 30 Minutes Intravenous Every 6 hours 05/26/18 1234 05/27/18 1538   05/19/18 1400  vancomycin (VANCOCIN) 1,250 mg in sodium chloride 0.9 % 250 mL IVPB  Status:  Discontinued     1,250 mg 166.7 mL/hr over 90 Minutes Intravenous Every 24 hours 05/18/18 1343 05/19/18 1244   05/19/18 1400  meropenem (MERREM) 2 g in sodium chloride 0.9 % 100 mL IVPB     2 g 200 mL/hr over 30 Minutes Intravenous Every 8 hours 05/19/18 1244 05/26/18 2359   05/18/18 1600  cefTRIAXone (ROCEPHIN) 2 g in sodium chloride 0.9 % 100 mL IVPB  Status:  Discontinued     2 g 200 mL/hr over 30 Minutes Intravenous Every 24 hours 05/18/18 1326 05/19/18 1244   05/18/18 1400  vancomycin (VANCOCIN) 2,000 mg in sodium chloride 0.9 % 500 mL IVPB     2,000 mg 250 mL/hr over 120 Minutes  Intravenous  Once 05/18/18 1343 05/18/18 1640   05/15/18 2333  ceFEPIme (MAXIPIME) 2 g in sodium chloride 0.9 % 100 mL IVPB  Status:  Discontinued     2 g 200 mL/hr over 30 Minutes Intravenous Every 8 hours 05/15/18 2226 05/18/18 1326   05/15/18 1500  ceFEPIme (MAXIPIME) 1 g in sodium chloride 0.9 % 100 mL IVPB  Status:  Discontinued     1 g 200 mL/hr over 30 Minutes Intravenous Every 24 hours 05/14/18 1512 05/15/18 2226   05/15/18 0000  anidulafungin (ERAXIS) 100 mg in sodium chloride 0.9 % 100 mL IVPB  Status:  Discontinued     100 mg 78 mL/hr over 100 Minutes Intravenous Every 24 hours 05/14/18 1728 05/15/18 1159   05/14/18 1800  anidulafungin (ERAXIS) 200 mg in sodium chloride 0.9 % 200 mL IVPB     200 mg 78 mL/hr over 200 Minutes Intravenous  Once 05/14/18 1751 05/14/18 2138   05/14/18 1745  vancomycin (VANCOCIN) 2,000 mg in sodium chloride 0.9 % 500 mL IVPB  Status:  Discontinued     2,000 mg 250 mL/hr over 120 Minutes Intravenous  Once 05/14/18 1741 05/15/18 1159   05/14/18 1741  vancomycin variable dose per unstable renal function (pharmacist dosing)  Status:  Discontinued      Does not apply See admin instructions 05/14/18 1741 05/15/18 1159   05/14/18 1515  ceFEPIme (MAXIPIME) 2 g in sodium chloride 0.9 % 100 mL IVPB     2 g 200 mL/hr over 30 Minutes Intravenous  Once 05/14/18 1512 05/14/18 1649   05/14/18 1515  metroNIDAZOLE (FLAGYL) IVPB 500 mg  Status:  Discontinued     500 mg 100 mL/hr over 60 Minutes Intravenous Every 8 hours 05/14/18 1512 05/19/18 1244      Procedures: 2/6>>PICC line 1/29>> exploratory laparotomy with small bowel anastomosis and closure of abdominal fascia 1/27>>Right IJ (Anesthesia) 1/27>>EXPLORATORY LAPAROTOMY,SMALL BOWEL RESECTION,CLOSURE WITH ABTHERA VAC  CONSULTS:  pulmonary/intensive care and general surgery  Time spent: 25- minutes-Greater than 50% of this time was spent in counseling, explanation of diagnosis, planning of further  management, and coordination of care.  MEDICATIONS: Scheduled Meds: . chlorhexidine  15 mL Mouth Rinse BID  . Chlorhexidine Gluconate Cloth  6 each Topical Daily  . enoxaparin (LOVENOX) injection  40 mg Subcutaneous Q24H  . feeding supplement  1 Container Oral QID  . fluticasone  2 spray Each Nare Daily  . guaiFENesin  600 mg Oral BID  . insulin aspart  0-20 Units Subcutaneous TID WC  . insulin glargine  24 Units Subcutaneous Daily  . ipratropium-albuterol  3 mL Nebulization BID  . lactose free nutrition  237 mL Oral TID WC  . mouth rinse  15 mL Mouth Rinse BID  . pantoprazole  40 mg Oral Daily  . venlafaxine XR  150 mg Oral Daily   Continuous Infusions: . dextrose 10 mL/hr at 05/25/18 1455   PRN Meds:.acetaminophen, alum & mag hydroxide-simeth, fentaNYL (SUBLIMAZE) injection, iopamidol, lip balm   PHYSICAL EXAM: Vital signs: Vitals:   06/01/18 1721 06/01/18 1930 06/01/18 2158 06/02/18 0526  BP: 133/83 138/81  136/81  Pulse: (!) 108 (!) 102 (!) 110 (!) 101  Resp: 18 16 18 18   Temp: 98.3 F (36.8 C) 99 F (37.2 C)  98.6 F (37 C)  TempSrc: Oral Oral  Oral  SpO2:   98% 95%  Weight:      Height:       Filed Weights   05/30/18 0550 05/31/18 0216 06/01/18 0500  Weight: 119 kg 113.8 kg 114.2 kg   Body mass index is 44.6 kg/m.   Exam  Awake Alert, Oriented X 3, No new F.N deficits, Normal affect [place].AT,PERRAL Supple Neck,No JVD, No cervical lymphadenopathy appriciated.  Symmetrical Chest wall movement, Good air movement bilaterally, CTAB RRR,No Gallops, Rubs or new Murmurs, No Parasternal Heave +ve B.Sounds, Abd Soft, No tenderness, No organomegaly appriciated, No rebound - guarding or rigidity. No Cyanosis, Clubbing or edema, No new Rash or bruise   I have personally reviewed following labs and imaging studies  LABORATORY DATA: CBC: Recent Labs  Lab 05/28/18 0449 05/29/18 1207 05/30/18 0342 06/01/18 0404 06/02/18 0401  WBC 8.1 6.3 5.9 6.4 7.0  NEUTROABS  6.2  --   --   --   --   HGB 8.4* 8.1* 7.8* 7.7* 8.8*  HCT 27.8* 26.5* 24.7* 25.5* 28.7*  MCV 85.0 84.4 84.6 84.2 84.2  PLT 281 284 271 278 161    Basic Metabolic Panel: Recent Labs  Lab 05/28/18 0449 05/29/18 0440 05/29/18 1207 05/30/18 0342 05/31/18 0409 06/02/18 0401  NA 139  --  139 141 140 143  K 3.6  --  3.6  3.7 3.6 3.3*  CL 109  --  108 111 109 111  CO2 24  --  23 26 24 23   GLUCOSE 163*  --  156* 130* 137* 85  BUN 15  --  15 15 15 12   CREATININE 0.71  --  0.75 0.75 0.80 0.75  CALCIUM 7.6*  --  7.8* 7.6* 7.8* 7.8*  MG 2.0  --   --  2.2 2.2  --   PHOS 1.9* 2.9  --   --  2.6  --     GFR: Estimated Creatinine Clearance: 93.3 mL/min (by C-G formula based on SCr of 0.75 mg/dL).  Liver Function Tests: Recent Labs  Lab 05/28/18 0449 05/31/18 0409  AST 36 94*  ALT 28 73*  ALKPHOS 78 90  BILITOT 0.2* 0.1*  PROT 5.7* 5.8*  ALBUMIN 2.0* 2.0*   No results for input(s): LIPASE, AMYLASE in the last 168 hours. No results for input(s): AMMONIA in the last 168 hours.  Coagulation Profile: No results for input(s): INR, PROTIME in the last 168 hours.  Cardiac Enzymes: No results for input(s): CKTOTAL, CKMB, CKMBINDEX, TROPONINI in the last 168 hours.  BNP (last 3 results) No results for input(s): PROBNP in the last 8760 hours.  HbA1C: No results for input(s): HGBA1C in the last 72 hours.  CBG: Recent Labs  Lab 06/01/18 1751 06/01/18 2025 06/01/18 2348 06/02/18 0524 06/02/18 0732  GLUCAP 92 89 92 78 106*    Lipid Profile: No results for input(s): CHOL, HDL, LDLCALC, TRIG, CHOLHDL, LDLDIRECT in the last 72 hours.  Thyroid Function Tests: No results for input(s): TSH, T4TOTAL, FREET4, T3FREE, THYROIDAB in the last 72 hours.  Anemia Panel: No results for input(s): VITAMINB12, FOLATE, FERRITIN, TIBC, IRON, RETICCTPCT in the last 72 hours.  Urine analysis:    Component Value Date/Time   COLORURINE YELLOW 05/14/2018 2014   APPEARANCEUR CLOUDY (A)  05/14/2018 2014   LABSPEC 1.023 05/14/2018 2014   PHURINE 5.0 05/14/2018 2014   GLUCOSEU >=500 (A) 05/14/2018 2014   HGBUR MODERATE (A) 05/14/2018 2014   BILIRUBINUR NEGATIVE 05/14/2018 2014   KETONESUR 20 (A) 05/14/2018 2014   PROTEINUR 100 (A) 05/14/2018 2014   NITRITE NEGATIVE 05/14/2018 2014   LEUKOCYTESUR TRACE (A) 05/14/2018 2014    Sepsis Labs: Lactic Acid, Venous    Component Value Date/Time   LATICACIDVEN 1.5 05/27/2018 1701    MICROBIOLOGY: Recent Results (from the past 240 hour(s))  Culture, blood (routine x 2)     Status: None   Collection Time: 05/24/18  7:20 PM  Result Value Ref Range Status   Specimen Description BLOOD LEFT ANTECUBITAL  Final   Special Requests   Final    BOTTLES DRAWN AEROBIC AND ANAEROBIC Blood Culture adequate volume   Culture   Final    NO GROWTH 5 DAYS Performed at [facility], [place] [address] [place], [place] [address]    Report Status 05/29/2018 FINAL  Final  Culture, blood (routine x 2)     Status: None   Collection Time: 05/24/18 10:28 PM  Result Value Ref Range Status   Specimen Description BLOOD LEFT THUMB  Final   Special Requests   Final    BOTTLES DRAWN AEROBIC ONLY Blood Culture adequate volume   Culture   Final    NO GROWTH 5 DAYS Performed at [facility], [place] [address] [place], [place] [address]    Report Status 05/30/2018 FINAL  Final  Culture, blood (routine x 2)  Status: None   Collection Time: 05/27/18 11:54 AM  Result Value Ref Range Status   Specimen Description BLOOD LEFT ANTECUBITAL  Final   Special Requests   Final    BOTTLES DRAWN AEROBIC ONLY Blood Culture results may not be optimal due to an inadequate volume of blood received in culture bottles   Culture   Final    NO GROWTH 5 DAYS Performed at [facility], [place] [address], [place], [place] [address]    Report Status 06/01/2018 FINAL  Final  Culture, blood (routine x 2)     Status: None   Collection Time: 05/27/18 12:04 PM    Result Value Ref Range Status   Specimen Description BLOOD LEFT ANTECUBITAL  Final   Special Requests   Final    BOTTLES DRAWN AEROBIC ONLY Blood Culture results may not be optimal due to an inadequate volume of blood received in culture bottles   Culture   Final    NO GROWTH 5 DAYS Performed at [facility], [place] [address] [place], [place] [address]    Report Status 06/01/2018 FINAL  Final  C difficile quick scan w PCR reflex     Status: None   Collection Time: 05/31/18  5:30 PM  Result Value Ref Range Status   C Diff antigen NEGATIVE NEGATIVE Final   C Diff toxin NEGATIVE NEGATIVE Final   C Diff interpretation No C. difficile detected.  Final    Comment: Performed at [facility], [place] [address] [place], [place] [address]    RADIOLOGY STUDIES/RESULTS: Ct Head Wo Contrast  Result Date: 05/18/2018 CLINICAL DATA:  Altered level of consciousness. Patient following commands intermittently. EXAM: CT HEAD WITHOUT CONTRAST TECHNIQUE: Contiguous axial images were obtained from the base of the skull through the vertex without intravenous contrast. COMPARISON:  None. FINDINGS: Brain: No evidence of acute infarction, hemorrhage, hydrocephalus, extra-axial collection or mass lesion/mass effect. Vascular: No hyperdense vessel or unexpected calcification. Skull: Normal. Negative for fracture or focal lesion. Sinuses/Orbits: Globes and orbits are unremarkable. There is mild mucosal thickening lining the ethmoid air cells and left sphenoid sinus with minimal right sphenoid sinus and maxillary sinus mucosal thickening. Other: None. IMPRESSION: 1. No intracranial abnormalities. 2. Sinus mucosal thickening. Electronically Signed   By: Lajean Manes M.D.   On: 05/18/2018 14:29   Ct Chest W Contrast  Result Date: 05/28/2018 CLINICAL DATA:  Chest congestion and nonproductive cough. EXAM: CT CHEST WITH CONTRAST TECHNIQUE: Multidetector CT imaging of the chest was performed during intravenous  contrast administration. CONTRAST:  58mL OMNIPAQUE IOHEXOL 300 MG/ML  SOLN COMPARISON:  Chest radiographs, 05/28/2018 and prior exams. FINDINGS: Cardiovascular: Heart is normal in size. No pericardial effusion. Great vessels are normal in caliber. No aortic atherosclerosis. Mediastinum/Nodes: Mild prominence of the right thyroid lobe with areas of decreased attenuation. Suspect multiple nodules, not well-defined by CT. No neck base or axillary masses or enlarged lymph nodes. No mediastinal or hilar masses or adenopathy. Trachea is unremarkable. Mild distention of the distal esophagus versus a small hiatal hernia. There is a focus of increased attenuation within this, likely some ingested dense material. No esophageal mass or wall thickening. Lungs/Pleura: Trace right pleural effusion. Opacity in the right lower lobe, most evident in the central lower lobe. This may reflect atelectasis or infection. There is a small area of consolidation right upper lobe centered on image 49, series 5. Small amount of dependent opacity is noted in the right upper lobe against the minor  fissure, likely atelectasis. Additional areas hazy ground-glass type opacity in both lungs, mostly in the upper lobes and right middle lobe. This may be due to infection/inflammation or air trapping from airways disease. Low lung volumes accentuates these findings. No lung mass or nodule. No evidence of pulmonary edema. No pneumothorax. Upper Abdomen: No acute abnormality. Musculoskeletal: No chest wall abnormality. No acute or significant osseous findings. IMPRESSION: 1. Consolidation in the right lower lobe, which may reflect atelectasis or pneumonia. Suspect pneumonia at least as a component. There is a small area of consolidation in the right upper lobe, consistent with an additional focus of pneumonia. Both lungs show areas of ground-glass opacity which may reflect additional infection/inflammation or be due to airways disease and air trapping. 2.  No evidence of pulmonary edema. 3. Minimal right pleural effusion. Electronically Signed   By: Lajean Manes M.D.   On: 05/28/2018 11:25   Ct Abdomen Pelvis W Contrast  Result Date: 05/24/2018 CLINICAL DATA:  Postop course prolonged and complicated by ileus-on TNA-due to worsening leukocytosis. Patient underwent exploratory laparotomy on 05/16/2018 small bowel resection. Concern for ileus versus small bowel obstruction or abscess. Leukocytosis. Question of anastomotic leak. EXAM: CT ABDOMEN AND PELVIS WITH CONTRAST TECHNIQUE: Multidetector CT imaging of the abdomen and pelvis was performed using the standard protocol following bolus administration of intravenous contrast. CONTRAST:  149mL OMNIPAQUE IOHEXOL 300 MG/ML  SOLN COMPARISON:  CT of the abdomen and pelvis on 05/14/2018 FINDINGS: Lower chest: There is new consolidation at the RIGHT LOWER lobe. Subsegmental atelectasis is identified at the LEFT lung base. Hepatobiliary: Small cysts within the liver measures 1.1 centimeters. The gallbladder is present and contains faintly radiopaque calculus or calculi. Pancreas: Unremarkable. No pancreatic ductal dilatation or surrounding inflammatory changes. Spleen: Normal in size without focal abnormality. Adrenals/Urinary Tract: Adrenal glands are unremarkable. Kidneys are normal, without renal calculi, focal lesion, or hydronephrosis. Bladder is unremarkable. Stomach/Bowel: The stomach is normal in appearance. There has been interval resection of abnormal small bowel loop in the RIGHT LOWER QUADRANT. The anastomosis in the RIGHT LOWER QUADRANT appears unremarkable. There are persistent dilated loops of small bowel particularly in the central abdomen showing multiple caliber changes, suggestive of adhesions. Numerous air-fluid levels are identified in the central small bowel loops. Although there is fluid in the mesentery and RIGHT LOWER QUADRANT, there is no evidence for anastomotic leak of contrast. No free  intraperitoneal air. There are scattered colonic diverticula but no evidence for acute diverticulitis. There is central mesenteric edema. Colon is normal in caliber. Vascular/Lymphatic: No significant vascular findings are present. No enlarged abdominal or pelvic lymph nodes. Reproductive: Uterus is enlarged and contains numerous low-attenuation lesions, suggestive of degenerating fibroids. No adnexal mass. Other: No free pelvic fluid. Mesenteric ascites. Edematous changes identified within the anterior abdominal wall. Postoperative changes in the anterior abdomen. Musculoskeletal: No acute or significant osseous findings. IMPRESSION: 1. Interval resection of abnormal small bowel loop in the RIGHT LOWER QUADRANT. 2. Persistent dilated loops of small bowel in the central abdomen, suggestive of adhesions, and consistent with small bowel obstruction. 3. No evidence for anastomotic leak. 4. Mesenteric edema. 5. New consolidation at the RIGHT lung base. 6. Cholelithiasis. 7. Enlarged uterus containing numerous low-attenuation lesions, suggestive of degenerating fibroids. Electronically Signed   By: Nolon Nations M.D.   On: 05/24/2018 16:06   Dg Chest Port 1 View  Result Date: 06/01/2018 CLINICAL DATA:  Shortness of breath. EXAM: PORTABLE CHEST 1 VIEW COMPARISON:  05/30/2018 FINDINGS: The right PICC line  is stable. Persistent eventration of the right hemidiaphragm. The heart is borderline enlarged but unchanged. There is central vascular congestion but no overt pulmonary edema. Persistent bibasilar atelectasis. No definite pleural effusions. The bony thorax is intact. IMPRESSION: 1. Stable right PICC line. 2. Central vascular calcification without overt pulmonary edema. 3. Persistent bibasilar atelectasis. Electronically Signed   By: Marijo Sanes M.D.   On: 06/01/2018 10:37   Dg Chest Port 1 View  Result Date: 05/28/2018 CLINICAL DATA:  58 year old female with shortness breath. Diabetes. Subsequent encounter.  EXAM: PORTABLE CHEST 1 VIEW COMPARISON:  05/27/2018 chest x-ray. 05/24/2018 CT abdomen and pelvis. FINDINGS: Persistent elevated right hemidiaphragm. Right mid to lower lobe consolidation may represent atelectasis or infiltrate and appears similar to prior exam. Central pulmonary vascular congestion. Mild cardiomegaly. No pneumothorax detected. Right PICC line tip mid superior vena cava level. No acute osseous abnormality. IMPRESSION: 1. Persistent elevated right hemidiaphragm. Right mid to lower lobe consolidation may represent atelectasis or infiltrate and appears similar to prior exam. 2. Central pulmonary vascular congestion. 3. Mild cardiomegaly. Electronically Signed   By: Genia Del M.D.   On: 05/28/2018 08:01   Dg Chest Port 1 View  Result Date: 05/27/2018 CLINICAL DATA:  Shortness of breath and cough EXAM: PORTABLE CHEST 1 VIEW COMPARISON:  Yesterday FINDINGS: Streaky opacities at the bases with elevated right diaphragm. Cardiomegaly. No Kerley lines or pneumothorax. Unremarkable right upper extremity PICC position. IMPRESSION: No change in atelectatic opacities at the bases, more extensive on the right. Electronically Signed   By: Monte Fantasia M.D.   On: 05/27/2018 09:12   Dg Chest Port 1 View  Result Date: 05/17/2018 CLINICAL DATA:  Endotracheally intubated. EXAM: PORTABLE CHEST 1 VIEW COMPARISON:  05/15/2018 FINDINGS: Endotracheal tube terminates 2.5 cm above the carina. Enteric tube courses into the abdomen with tip not imaged and side hole below the diaphragm. Right jugular catheter terminates near the superior cavoatrial junction. The cardiomediastinal silhouette is unchanged with normal heart size. Mild bibasilar opacities have slightly improved. No sizable pleural effusion or pneumothorax is identified. IMPRESSION: Decreased, mild bibasilar atelectasis. Electronically Signed   By: Logan Bores M.D.   On: 05/17/2018 07:14   Dg Chest Port 1 View  Result Date: 05/15/2018 CLINICAL DATA:   Ventilator support EXAM: PORTABLE CHEST 1 VIEW COMPARISON:  05/14/2018 FINDINGS: Endotracheal tube tip is 2 cm above the carina. Nasogastric tube enters the stomach. Right internal jugular central line tip is in the proximal right atrium. No pneumothorax. Mild bilateral lower lobe atelectasis. IMPRESSION: Lines and tubes well positioned. Mild basilar atelectasis. Electronically Signed   By: Nelson Chimes M.D.   On: 05/15/2018 07:41   Dg Chest Portable 1 View  Result Date: 05/14/2018 CLINICAL DATA:  Abdominal pain for several days EXAM: PORTABLE CHEST 1 VIEW COMPARISON:  CT from earlier in the same day. FINDINGS: Elevation of the right hemidiaphragm is again seen. Cardiac shadow is within normal limits. Mild right basilar atelectatic changes are noted. No sizable effusion is seen. No bony abnormality is noted. IMPRESSION: Mild right basilar atelectasis. Electronically Signed   By: Inez Catalina M.D.   On: 05/14/2018 15:26   Dg Chest Port 1v Same Day  Result Date: 05/30/2018 CLINICAL DATA:  Shortness of breath, cough. EXAM: PORTABLE CHEST 1 VIEW COMPARISON:  Radiograph of May 14, 2018. CT scan of May 28, 2018. FINDINGS: Stable cardiomediastinal silhouette. Interval placement of right-sided PICC line with distal tip in expected position of the SVC. No pneumothorax or pleural effusion  is noted. No consolidative process is noted. Bony thorax is unremarkable. IMPRESSION: No acute cardiopulmonary abnormality seen. Interval placement of right-sided PICC line as described above. Electronically Signed   By: Marijo Conception, M.D.   On: 05/30/2018 11:57   Dg Chest Port 1v Same Day  Result Date: 05/27/2018 CLINICAL DATA:  Shortness of breath, history diabetes mellitus, GERD, hypertension EXAM: PORTABLE CHEST 1 VIEW COMPARISON:  Portable exam 1624 hours compared to 05/27/2018 FINDINGS: Normal heart size, mediastinal contours, and pulmonary vascularity. RIGHT arm PICC line tip projects over SVC. Chronic  elevation of RIGHT diaphragm with subsegmental atelectasis at RIGHT base. Central peribronchial thickening. No infiltrate, pleural effusion or pneumothorax. IMPRESSION: Bronchitic changes with persistent RIGHT basilar atelectasis. Electronically Signed   By: Lavonia Dana M.D.   On: 05/27/2018 16:59   Dg Chest Port 1v Same Day  Result Date: 05/26/2018 CLINICAL DATA:  58 year old female with shortness of breath. Recent abdominal surgery. EXAM: PORTABLE CHEST 1 VIEW COMPARISON:  Prior chest x-ray 05/17/2018 FINDINGS: The patient has been extubated and the nasogastric tube removed. Additionally, the right IJ central venous catheter has been removed. A right upper extremity PICC has been placed. The catheter tip overlies the distal SVC. Dense band of linear airspace opacity in the right lung obscuring the right cardiac margin favored to represent right middle lobar collapse/atelectasis. Additionally, there is linear atelectasis in the left lower lobe. No evidence of pulmonary edema or pneumothorax. No acute osseous abnormality. IMPRESSION: 1. Multifocal atelectasis including right middle lobe collapse. This may be secondary to mucous plugging. 2. Well-positioned right upper extremity approach PICC. Electronically Signed   By: Jacqulynn Cadet M.D.   On: 05/26/2018 10:19   Dg Abd Portable 1v  Result Date: 05/26/2018 CLINICAL DATA:  58 year old female with abdominal distension and shortness of breath EXAM: PORTABLE ABDOMEN - 1 VIEW COMPARISON:  CT abdomen/pelvis 05/24/2018 FINDINGS: Persistent diffuse gaseous distention of multiple loops of small bowel throughout the abdomen. No massive free air visualized on this supine radiograph. Lower lumbar degenerative disc disease again noted. IMPRESSION: Persistent gaseous distention of multiple loops of small bowel throughout the abdomen most consistent with ileus. Electronically Signed   By: Jacqulynn Cadet M.D.   On: 05/26/2018 10:18   Dg Abd Portable 1v  Result  Date: 05/24/2018 CLINICAL DATA:  Initial evaluation for acute vomiting, small bowel obstruction. EXAM: PORTABLE ABDOMEN - 1 VIEW COMPARISON:  Prior CT from 05/14/2018. FINDINGS: Multiple dilated gas-filled loops of small bowel seen within the left and mid abdomen, compatible with ongoing small bowel obstruction. Loops dilated up to approximately 4 cm in diameter. Paucity of gas distally. No soft tissue mass or abnormal calcification. No appreciable free air on these limited supine views of the abdomen. IMPRESSION: Multiple persistent gas-filled dilated loops of small bowel within the left and mid abdomen, compatible with ongoing small bowel obstruction. Electronically Signed   By: Jeannine Boga M.D.   On: 05/24/2018 06:00   Ct Renal Stone Study  Result Date: 05/14/2018 CLINICAL DATA:  59 y/o F; nausea, vomiting, and abdominal pain for 3 days. EXAM: CT ABDOMEN AND PELVIS WITHOUT CONTRAST TECHNIQUE: Multidetector CT imaging of the abdomen and pelvis was performed following the standard protocol without IV contrast. COMPARISON:  None. FINDINGS: Lower chest: Platelike atelectasis in the lung bases. Hepatobiliary: No focal liver abnormality is seen. No gallstones, gallbladder wall thickening, or biliary dilatation. Pancreas: Unremarkable. No pancreatic ductal dilatation or surrounding inflammatory changes. Spleen: Normal in size without focal abnormality. Adrenals/Urinary Tract:  Adrenal glands are unremarkable. Kidneys are normal, without renal calculi, focal lesion, or hydronephrosis. Bladder is unremarkable. Stomach/Bowel: Severe diffuse small-bowel obstruction with transition at the level of the distal ileum in the right lower quadrant (series 3, image 65). Mild sigmoid diverticulosis without findings of acute diverticulitis. Otherwise normal appearance of the colon. Vascular/Lymphatic: Aortic atherosclerosis. No enlarged abdominal or pelvic lymph nodes. Reproductive: Negative. Other: There are a few tiny  foci of pneumoperitoneum at the liver hilum, anterior abdominal wall, and below the diaphragm (series 3, image 12, 24, 27). Small volume of ascites. Musculoskeletal: No fracture is seen. Lumbar spondylosis with prominent facet arthropathy. IMPRESSION: 1. Severe diffuse small bowel obstruction with transition level of distal ileum in right lower quadrant. 2. Few punctate foci of pneumoperitoneum indicating small/micro perforation. Small volume of ascites. These results were called by telephone at the time of interpretation on 05/14/2018 at 3:27 pm to Dr. Andy Gauss, who verbally acknowledged these results. Electronically Signed   By: Kristine Garbe M.D.   On: 05/14/2018 15:34   Vas [place] Lower Extremity Venous (dvt)  Result Date: 06/01/2018  Lower Venous Study Indications: Edema.  Performing Technologist: June Leap RDMS, RVT  Examination Guidelines: A complete evaluation includes B-mode imaging, spectral Doppler, color Doppler, and power Doppler as needed of all accessible portions of each vessel. Bilateral testing is considered an integral part of a complete examination. Limited examinations for reoccurring indications may be performed as noted.  Right Venous Findings: +---------+---------------+---------+-----------+----------+-------+          CompressibilityPhasicitySpontaneityPropertiesSummary +---------+---------------+---------+-----------+----------+-------+ CFV      Full           Yes      Yes                          +---------+---------------+---------+-----------+----------+-------+ SFJ      Full                                                 +---------+---------------+---------+-----------+----------+-------+ FV Prox  Full                                                 +---------+---------------+---------+-----------+----------+-------+ FV Mid   Full                                                  +---------+---------------+---------+-----------+----------+-------+ FV DistalFull                                                 +---------+---------------+---------+-----------+----------+-------+ PFV      Full                                                 +---------+---------------+---------+-----------+----------+-------+ POP      Full  Yes      Yes                          +---------+---------------+---------+-----------+----------+-------+ PTV      Full                                                 +---------+---------------+---------+-----------+----------+-------+ PERO     Full                                                 +---------+---------------+---------+-----------+----------+-------+  Left Venous Findings: +---------+---------------+---------+-----------+----------+-------+          CompressibilityPhasicitySpontaneityPropertiesSummary +---------+---------------+---------+-----------+----------+-------+ CFV      Full           Yes      Yes                          +---------+---------------+---------+-----------+----------+-------+ SFJ      Full                                                 +---------+---------------+---------+-----------+----------+-------+ FV Prox  Full                                                 +---------+---------------+---------+-----------+----------+-------+ FV Mid   Full                                                 +---------+---------------+---------+-----------+----------+-------+ FV DistalFull                                                 +---------+---------------+---------+-----------+----------+-------+ PFV      Full                                                 +---------+---------------+---------+-----------+----------+-------+ POP      Full           Yes      Yes                           +---------+---------------+---------+-----------+----------+-------+ PTV      Full                                                 +---------+---------------+---------+-----------+----------+-------+ PERO     Full                                                 +---------+---------------+---------+-----------+----------+-------+  Summary: Right: There is no evidence of deep vein thrombosis in the lower extremity. No cystic structure found in the popliteal fossa. Left: There is no evidence of deep vein thrombosis in the lower extremity. No cystic structure found in the popliteal fossa.  *See table(s) above for measurements and observations.    Preliminary    [place] Ekg Site Rite  Result Date: 05/23/2018 If [facility] image not attached, placement could not be confirmed due to current cardiac rhythm.    LOS: 19 days   Signature  Lala Lund M.D on 06/02/2018 at 11:36 AM   -  To page go to [email or web address]

## 2018-06-02 NOTE — Progress Notes (Addendum)
[name] CONSULT NOTE   Pharmacy Consult for TPN Indication: ileus  Patient Measurements: Height: 5\' 3"  (160 cm) Weight: 251 lb 12.3 oz (114.2 kg) IBW/kg (Calculated) : 52.4 TPN AdjBW (KG): 68.8 Body mass index is 44.6 kg/m. Usual Weight: 117.9 kg  Assessment:  58 y/o female presenting to the ED with 3 days of vomiting and abdominal pain found to be in DKA. CT showed SBO. Noted SBO, pneumoperitoneum, intussusception with necrosis and performation and was taken to the OR emergently for ex lap, SBR left in discontinuity, and abthera VAC on 1/27. Taken back to the OR on 1/29 for ex lap with SB anastomosis and fascia closure. High risk for refeeding.   GI: SBO, pneumoperitoneum, intussusception with necrosis and perforation. Patient reporting nausea even with water. Albumin 2.1>2, prealbumin 10.1>14.4>17.8,  +flatus and  LBM 2/12. PPI IV>po for GERD. Po Intake 0 documented last 24hr of soft diet. Drains 24ml. C/o abdominal pain and loose BM. C/o food taste.  - CT scan 2/6 showing ileus vs SBO, anastomosis intact, no abscess Ileus improving - diet advanced to soft diet  Endo: hx DM, CBGs 73-140 > cbgs more on the low side, patient is on d5 @ kvo Insulin requirements in the past 24 hours: 0 units SSI, 40 units in TPN, 24 units of Lantus  Lytes: K 3.3-kdur ordered, Mag 2.2, Phos 1.9 > 2.6 Renal: AKI resolved, SCr stable Pulm: RA (Flonase,Mucinex, duonebs) Cards: h/o HLD, HTN, HR tachy Hepatobil: LFTs up acutely this AM 36>94, ALT 28>73, but Tbili WNL, TG 139 Neuro: Effexor. Can be uncooperative. GB:TDVV 101.7, Tc 99.9, WBC 5.9 down. Merrem>>Unasyn. 1/27: Clostridium S[place]elli 1/27: Peritoneal fluid: Enterococcus faecium, Clostridium  TPN Access: double lumen CVC placed 05/14/18 TPN start date: 05/19/18  Nutritional Goals (per RD recommendation on 05/19/18): KCal: 2100-2300 Protein: 116-128 g Fluid: >1.6L  Current Nutrition:  TPN at 45 ml/hr, resource  breeze, soft diet Patient is eating about 4 Resource Breezes/day at 250 kcal + 9 g of protein/each  Plan:  -Continue TPN to 58ml/hr, would consider stopping TPN today or tomorrow -This TPN provides 63 g of protein, 151 g of dextrose, and 29 g of lipids which provides 1060 kCals per day, meeting 50% of patient needs; however, combined with [employer], patient is meeting 100% of needs -Electrolytes in TPN: Increase K+, others to standard -Continue MVI, trace elements in TPN -Reduce insulin in TPN to 10 units -Continue resistant SSI Q4h and Lantus 24 units  -Monitor how patient tolerates diet and ability to wean TPN   Wendy [name] 06/02/2018, 7:58 AM   Addendum -Hold TPN for 24 hours -Will not make bag for tonight, f/u plan for TPN on 2/16  Wendy [name] 06/02/2018 11:40 AM

## 2018-06-03 LAB — BASIC METABOLIC PANEL
Anion gap: 10 (ref 5–15)
BUN: 10 mg/dL (ref 6–20)
CHLORIDE: 109 mmol/L (ref 98–111)
CO2: 22 mmol/L (ref 22–32)
Calcium: 7.7 mg/dL — ABNORMAL LOW (ref 8.9–10.3)
Creatinine, Ser: 0.73 mg/dL (ref 0.44–1.00)
GFR calc Af Amer: >60 mL/min (ref >60)
GFR calc non Af Amer: >60 mL/min (ref >60)
GLUCOSE: 80 mg/dL (ref 70–99)
Potassium: 3.3 mmol/L — ABNORMAL LOW (ref 3.5–5.1)
Sodium: 141 mmol/L (ref 135–145)

## 2018-06-03 LAB — CBC
HCT: 27.4 % — ABNORMAL LOW (ref 36.0–46.0)
Hemoglobin: 8.7 g/dL — ABNORMAL LOW (ref 12.0–15.0)
MCH: 26.9 pg (ref 26.0–34.0)
MCHC: 31.8 g/dL (ref 30.0–36.0)
MCV: 84.8 fL (ref 80.0–100.0)
Platelets: 305 10*3/uL (ref 150–400)
RBC: 3.23 MIL/uL — ABNORMAL LOW (ref 3.87–5.11)
RDW: 16.9 % — ABNORMAL HIGH (ref 11.5–15.5)
WBC: 7.8 10*3/uL (ref 4.0–10.5)
nRBC: 0 % (ref 0.0–0.2)

## 2018-06-03 LAB — GLUCOSE, CAPILLARY
Glucose-Capillary: 81 mg/dL (ref 70–99)
Glucose-Capillary: 85 mg/dL (ref 70–99)
Glucose-Capillary: 86 mg/dL (ref 70–99)
Glucose-Capillary: 163 mg/dL — ABNORMAL HIGH (ref 70–99)

## 2018-06-03 LAB — MAGNESIUM: Magnesium: 2.1 mg/dL (ref 1.7–2.4)

## 2018-06-03 MED ORDER — WHITE PETROLATUM EX OINT
TOPICAL_OINTMENT | CUTANEOUS | Status: AC
  Administered 2018-06-03: 21:00:00
  Filled 2018-06-03: qty 28.35

## 2018-06-03 MED ORDER — POTASSIUM CHLORIDE 20 MEQ PO PACK
40.0000 meq | PACK | Freq: Once | ORAL | Status: AC
  Administered 2018-06-03: 40 meq via ORAL
  Filled 2018-06-03: qty 2

## 2018-06-03 MED ORDER — POTASSIUM CHLORIDE CRYS ER 20 MEQ PO TBCR
40.0000 meq | EXTENDED_RELEASE_TABLET | Freq: Once | ORAL | Status: DC
  Filled 2018-06-03: qty 2

## 2018-06-03 MED ORDER — IPRATROPIUM-ALBUTEROL 0.5-2.5 (3) MG/3ML IN SOLN: 3.0000 mL | Freq: Two times a day (BID) | RESPIRATORY_TRACT | Status: DC | PRN

## 2018-06-03 NOTE — Progress Notes (Signed)
18 Days Post-Op   Subjective/Chief Complaint: none  Patient doing well.  Tolerating diet and off TPN. Wants to leave the hospital.    Objective: Vital signs in last 24 hours: Temp:  [98.3 F (36.8 C)-99.1 F (37.3 C)] 98.3 F (36.8 C) (02/16 0502) Pulse Rate:  [96-105] 96 (02/16 0502) Resp:  [16-20] 20 (02/16 0502) BP: (133-152)/(69-95) 137/69 (02/16 0502) SpO2:  [94 %-98 %] 98 % (02/16 0502) Weight:  [782.9 kg] 114.2 kg (02/16 0512) Last BM Date: 06/02/18  Intake/Output from previous day: 02/15 0701 - 02/16 0700 In: -  Out: 25 [Drains:25] Intake/Output this shift: No intake/output data recorded.  Incision/Wound: Wound VAC in place and functioning.  Lab Results:  Recent Labs    06/02/18 0401 06/03/18 0356  WBC 7.0 7.8  HGB 8.8* 8.7*  HCT 28.7* 27.4*  PLT 290 305   BMET Recent Labs    06/02/18 0401 06/03/18 0356  NA 143 141  K 3.3* 3.3*  CL 111 109  CO2 23 22  GLUCOSE 85 80  BUN 12 10  CREATININE 0.75 0.73  CALCIUM 7.8* 7.7*   PT/INR No results for input(s): LABPROT, INR in the last 72 hours. ABG No results for input(s): PHART, HCO3 in the last 72 hours.  Invalid input(s): PCO2, PO2  Studies/Results: Vas [place] Lower Extremity Venous (dvt)  Result Date: 06/01/2018  Lower Venous Study Indications: Edema.  Performing Technologist: June Leap RDMS, RVT  Examination Guidelines: A complete evaluation includes B-mode imaging, spectral Doppler, color Doppler, and power Doppler as needed of all accessible portions of each vessel. Bilateral testing is considered an integral part of a complete examination. Limited examinations for reoccurring indications may be performed as noted.  Right Venous Findings: +---------+---------------+---------+-----------+----------+-------+          CompressibilityPhasicitySpontaneityPropertiesSummary +---------+---------------+---------+-----------+----------+-------+ CFV      Full           Yes      Yes                           +---------+---------------+---------+-----------+----------+-------+ SFJ      Full                                                 +---------+---------------+---------+-----------+----------+-------+ FV Prox  Full                                                 +---------+---------------+---------+-----------+----------+-------+ FV Mid   Full                                                 +---------+---------------+---------+-----------+----------+-------+ FV DistalFull                                                 +---------+---------------+---------+-----------+----------+-------+ PFV      Full                                                 +---------+---------------+---------+-----------+----------+-------+  POP      Full           Yes      Yes                          +---------+---------------+---------+-----------+----------+-------+ PTV      Full                                                 +---------+---------------+---------+-----------+----------+-------+ PERO     Full                                                 +---------+---------------+---------+-----------+----------+-------+  Left Venous Findings: +---------+---------------+---------+-----------+----------+-------+          CompressibilityPhasicitySpontaneityPropertiesSummary +---------+---------------+---------+-----------+----------+-------+ CFV      Full           Yes      Yes                          +---------+---------------+---------+-----------+----------+-------+ SFJ      Full                                                 +---------+---------------+---------+-----------+----------+-------+ FV Prox  Full                                                 +---------+---------------+---------+-----------+----------+-------+ FV Mid   Full                                                  +---------+---------------+---------+-----------+----------+-------+ FV DistalFull                                                 +---------+---------------+---------+-----------+----------+-------+ PFV      Full                                                 +---------+---------------+---------+-----------+----------+-------+ POP      Full           Yes      Yes                          +---------+---------------+---------+-----------+----------+-------+ PTV      Full                                                 +---------+---------------+---------+-----------+----------+-------+  PERO     Full                                                 +---------+---------------+---------+-----------+----------+-------+    Summary: Right: There is no evidence of deep vein thrombosis in the lower extremity. No cystic structure found in the popliteal fossa. Left: There is no evidence of deep vein thrombosis in the lower extremity. No cystic structure found in the popliteal fossa.  *See table(s) above for measurements and observations.    Preliminary     Anti-infectives: Anti-infectives (From admission, onward)   Start     Dose/Rate Route Frequency Ordered Stop   05/30/18 1830  Ampicillin-Sulbactam (UNASYN) 3 g in sodium chloride 0.9 % 100 mL IVPB  Status:  Discontinued     3 g 200 mL/hr over 30 Minutes Intravenous Every 6 hours 05/30/18 1810 05/31/18 0823   05/30/18 1530  amoxicillin-clavulanate (AUGMENTIN) 875-125 MG per tablet 1 tablet  Status:  Discontinued     1 tablet Oral Every 12 hours 05/30/18 1447 05/30/18 1810   05/28/18 0500  vancomycin (VANCOCIN) 1,250 mg in sodium chloride 0.9 % 250 mL IVPB  Status:  Discontinued     1,250 mg 166.7 mL/hr over 90 Minutes Intravenous Every 12 hours 05/27/18 1612 05/30/18 0936   05/27/18 1700  vancomycin (VANCOCIN) 2,500 mg in sodium chloride 0.9 % 500 mL IVPB     2,500 mg 250 mL/hr over 120 Minutes Intravenous  Once 05/27/18 1612  05/27/18 1922   05/27/18 1600  meropenem (MERREM) 2 g in sodium chloride 0.9 % 100 mL IVPB  Status:  Discontinued     2 g 200 mL/hr over 30 Minutes Intravenous Every 8 hours 05/27/18 1544 05/30/18 1442   05/26/18 1300  Ampicillin-Sulbactam (UNASYN) 3 g in sodium chloride 0.9 % 100 mL IVPB  Status:  Discontinued     3 g 200 mL/hr over 30 Minutes Intravenous Every 6 hours 05/26/18 1234 05/27/18 1538   05/19/18 1400  vancomycin (VANCOCIN) 1,250 mg in sodium chloride 0.9 % 250 mL IVPB  Status:  Discontinued     1,250 mg 166.7 mL/hr over 90 Minutes Intravenous Every 24 hours 05/18/18 1343 05/19/18 1244   05/19/18 1400  meropenem (MERREM) 2 g in sodium chloride 0.9 % 100 mL IVPB     2 g 200 mL/hr over 30 Minutes Intravenous Every 8 hours 05/19/18 1244 05/26/18 2359   05/18/18 1600  cefTRIAXone (ROCEPHIN) 2 g in sodium chloride 0.9 % 100 mL IVPB  Status:  Discontinued     2 g 200 mL/hr over 30 Minutes Intravenous Every 24 hours 05/18/18 1326 05/19/18 1244   05/18/18 1400  vancomycin (VANCOCIN) 2,000 mg in sodium chloride 0.9 % 500 mL IVPB     2,000 mg 250 mL/hr over 120 Minutes Intravenous  Once 05/18/18 1343 05/18/18 1640   05/15/18 2333  ceFEPIme (MAXIPIME) 2 g in sodium chloride 0.9 % 100 mL IVPB  Status:  Discontinued     2 g 200 mL/hr over 30 Minutes Intravenous Every 8 hours 05/15/18 2226 05/18/18 1326   05/15/18 1500  ceFEPIme (MAXIPIME) 1 g in sodium chloride 0.9 % 100 mL IVPB  Status:  Discontinued     1 g 200 mL/hr over 30 Minutes Intravenous Every 24 hours 05/14/18 1512 05/15/18 2226   05/15/18 0000  anidulafungin (ERAXIS) 100  mg in sodium chloride 0.9 % 100 mL IVPB  Status:  Discontinued     100 mg 78 mL/hr over 100 Minutes Intravenous Every 24 hours 05/14/18 1728 05/15/18 1159   05/14/18 1800  anidulafungin (ERAXIS) 200 mg in sodium chloride 0.9 % 200 mL IVPB     200 mg 78 mL/hr over 200 Minutes Intravenous  Once 05/14/18 1751 05/14/18 2138   05/14/18 1745  vancomycin (VANCOCIN)  2,000 mg in sodium chloride 0.9 % 500 mL IVPB  Status:  Discontinued     2,000 mg 250 mL/hr over 120 Minutes Intravenous  Once 05/14/18 1741 05/15/18 1159   05/14/18 1741  vancomycin variable dose per unstable renal function (pharmacist dosing)  Status:  Discontinued      Does not apply See admin instructions 05/14/18 1741 05/15/18 1159   05/14/18 1515  ceFEPIme (MAXIPIME) 2 g in sodium chloride 0.9 % 100 mL IVPB     2 g 200 mL/hr over 30 Minutes Intravenous  Once 05/14/18 1512 05/14/18 1649   05/14/18 1515  metroNIDAZOLE (FLAGYL) IVPB 500 mg  Status:  Discontinued     500 mg 100 mL/hr over 60 Minutes Intravenous Every 8 hours 05/14/18 1512 05/19/18 1244      Assessment/Plan: s/p Procedure(s): EXPLORATORY LAPAROTOMY with reanatomosis and fascia closure with wound vacum application (N/A)  DKA- per medicine, resolved Sepsis- secondary to below intra-abdominal problem, resolved Right-side PNA- contnue pulmonary toilet  SBO,Pneumoperitoneum, intussusception with necrosis & perforation -S/P ex lap, small bowel resection, closure with abthera vac, Dr. Grandville Silos, 01/27 POD 18 -S/PExploratorylaparotomy with small bowel anastomosis and closure of abdominal fascia, Dr. Brantley Stage, 01/29POD 16 - on regular diet, but not eating much.  Drinking her breeze well.  wound vac changes MWF -diarrhea resolved.  c diff negative -mobilize, IS  TGG:YIRS diet, calorie count in progress VTE: SCD's, startedlovenox 01/30 WN:[id number] for HCAP, stopped 2/13 Foley:DC02/05 Follow up:Dr. Grandville Silos  Plan- has supplies for wound vac at home. Tolerating diet and having bowel function. Discharge when deemed apporpriate per primary service.     LOS: 20 days    Wendy [name] 06/03/2018

## 2018-06-03 NOTE — Progress Notes (Signed)
PROGRESS NOTE        PATIENT DETAILS Name: Wendy [name] Age: 58 y.o. Sex: female Date of Birth: 10-12-1960 Admit Date: 05/14/2018 Admitting Physician Rush Farmer, MD [id number], Drue Stager, MD  Brief Narrative: Patient is a 58 y.o. female with history of DM-2, pretension, GERD, morbid obesity who presented with a 3-day history of vomiting and abdominal pain-found to have sepsis secondary to small bowel obstruction and small perforation, along with DKA.  She was started on IV insulin, empiric antimicrobial therapy-General surgery was consulted-and patient was admitted to the ICU under the PCCM service.  Hospital course lately has been complicated by development of ileus, pneumonia, and some intermittent diarrhea (C. difficile PCR negative)  Significant Events: 1/27 Admit > OR for ex lap with small bowel resection> ICU 1/29 OR for anastomosis and closure 2/1 Extubated 2/2 Hypoactive delirium, worsening respiratory status 2/8>>Right PNA  Subjective:  Patient in bed, appears comfortable, denies any headache, no fever, no chest pain or pressure, no shortness of breath , no abdominal pain. No focal weakness.    Assessment/Plan:  Acute hypoxic respiratory failure: Extubated on 2/1 (intubated for surgery) continue to wean to RA as able.  Septic shock secondary to peritonitis with bowel obstruction along with Clostridium bacteremia: Sepsis pathophysiology has resolved, blood cultures positive for Clostridium species.  Peritoneal fluid cultures positive for enterococcus and Clostridium as well.  Has completed a course of meropenem till 2/8.   Small bowel obstruction with perforation s/p resection and closure of the abdominal fascia with prolonged postop course due to ileus: Clinically much improved and ileus seems to have resolved, TNA was stopped on 06/02/2018, currently tolerating oral diet which will be continued, if no further issues likely discharge early morning  06/04/2018.    To continue with wound VAC changes Monday Wednesday Friday, case management has already arranged for home RN and wound VAC.  Fever secondary to right-sided pneumonia: Fever finally resolved-no fever overnight.  Highly likely that pneumonia was secondary to- poor respiratory effort/cough effort-and refusal to be mobilized/ambulated.  Per nursing staff-patient has refused chest PT multiple times.  All antimicrobial therapy was discontinued on 2/13-slowly improving-now afebrile overnight.  she has been counseled extensively over the past week or so regarding importance of chest PT, mobilization/ambulation, incentive spirometry and flutter valve-reemphasized today.   Anemia: Secondary to acute/critical illness-no evidence of blood loss.  Claims that over the past few days-she has had worsening exertional dyspnea-although this exertional dyspnea is multifactorial secondary to obesity, deconditioning-we will transfuse 1 unit of PRBC to see if symptoms improve.  Repeat CBC tomorrow.   Hypertension: Blood pressure relatively well controlled-continue to monitor off antihypertensives-once oral intake has resumed we can resume her blood pressure medications.    GERD: Continue PPI  Dyslipidemia: Resume statin when oral intake is further stabilized  Debility/deconditioning with morbid obesity: Secondary to acute illness/critical illness-discharge in the next few days to CIR or home with home health.  Follow with PCP for weight loss.  Type II DM with uncontrolled hyperglycemia: Likely had DKA upon admission which has resolved, currently on Lantus and sliding scale.  Monitor CBGs closely.  Lab Results  Component Value Date   HGBA1C 8.2 (A) 02/06/2018   CBG (last 3)  Recent Labs    06/02/18 1704 06/02/18 2110 06/03/18 0815  GLUCAP 89 91 81     DVT Prophylaxis: Prophylactic  Lovenox   Code Status: Full code   Family Communication: None at bedside  Disposition Plan: Remain  inpatient-suspect CIR on discharge  Antimicrobial agents: Anti-infectives (From admission, onward)   Start     Dose/Rate Route Frequency Ordered Stop   05/30/18 1830  Ampicillin-Sulbactam (UNASYN) 3 g in sodium chloride 0.9 % 100 mL IVPB  Status:  Discontinued     3 g 200 mL/hr over 30 Minutes Intravenous Every 6 hours 05/30/18 1810 05/31/18 0823   05/30/18 1530  amoxicillin-clavulanate (AUGMENTIN) 875-125 MG per tablet 1 tablet  Status:  Discontinued     1 tablet Oral Every 12 hours 05/30/18 1447 05/30/18 1810   05/28/18 0500  vancomycin (VANCOCIN) 1,250 mg in sodium chloride 0.9 % 250 mL IVPB  Status:  Discontinued     1,250 mg 166.7 mL/hr over 90 Minutes Intravenous Every 12 hours 05/27/18 1612 05/30/18 0936   05/27/18 1700  vancomycin (VANCOCIN) 2,500 mg in sodium chloride 0.9 % 500 mL IVPB     2,500 mg 250 mL/hr over 120 Minutes Intravenous  Once 05/27/18 1612 05/27/18 1922   05/27/18 1600  meropenem (MERREM) 2 g in sodium chloride 0.9 % 100 mL IVPB  Status:  Discontinued     2 g 200 mL/hr over 30 Minutes Intravenous Every 8 hours 05/27/18 1544 05/30/18 1442   05/26/18 1300  Ampicillin-Sulbactam (UNASYN) 3 g in sodium chloride 0.9 % 100 mL IVPB  Status:  Discontinued     3 g 200 mL/hr over 30 Minutes Intravenous Every 6 hours 05/26/18 1234 05/27/18 1538   05/19/18 1400  vancomycin (VANCOCIN) 1,250 mg in sodium chloride 0.9 % 250 mL IVPB  Status:  Discontinued     1,250 mg 166.7 mL/hr over 90 Minutes Intravenous Every 24 hours 05/18/18 1343 05/19/18 1244   05/19/18 1400  meropenem (MERREM) 2 g in sodium chloride 0.9 % 100 mL IVPB     2 g 200 mL/hr over 30 Minutes Intravenous Every 8 hours 05/19/18 1244 05/26/18 2359   05/18/18 1600  cefTRIAXone (ROCEPHIN) 2 g in sodium chloride 0.9 % 100 mL IVPB  Status:  Discontinued     2 g 200 mL/hr over 30 Minutes Intravenous Every 24 hours 05/18/18 1326 05/19/18 1244   05/18/18 1400  vancomycin (VANCOCIN) 2,000 mg in sodium chloride 0.9 % 500  mL IVPB     2,000 mg 250 mL/hr over 120 Minutes Intravenous  Once 05/18/18 1343 05/18/18 1640   05/15/18 2333  ceFEPIme (MAXIPIME) 2 g in sodium chloride 0.9 % 100 mL IVPB  Status:  Discontinued     2 g 200 mL/hr over 30 Minutes Intravenous Every 8 hours 05/15/18 2226 05/18/18 1326   05/15/18 1500  ceFEPIme (MAXIPIME) 1 g in sodium chloride 0.9 % 100 mL IVPB  Status:  Discontinued     1 g 200 mL/hr over 30 Minutes Intravenous Every 24 hours 05/14/18 1512 05/15/18 2226   05/15/18 0000  anidulafungin (ERAXIS) 100 mg in sodium chloride 0.9 % 100 mL IVPB  Status:  Discontinued     100 mg 78 mL/hr over 100 Minutes Intravenous Every 24 hours 05/14/18 1728 05/15/18 1159   05/14/18 1800  anidulafungin (ERAXIS) 200 mg in sodium chloride 0.9 % 200 mL IVPB     200 mg 78 mL/hr over 200 Minutes Intravenous  Once 05/14/18 1751 05/14/18 2138   05/14/18 1745  vancomycin (VANCOCIN) 2,000 mg in sodium chloride 0.9 % 500 mL IVPB  Status:  Discontinued  2,000 mg 250 mL/hr over 120 Minutes Intravenous  Once 05/14/18 1741 05/15/18 1159   05/14/18 1741  vancomycin variable dose per unstable renal function (pharmacist dosing)  Status:  Discontinued      Does not apply See admin instructions 05/14/18 1741 05/15/18 1159   05/14/18 1515  ceFEPIme (MAXIPIME) 2 g in sodium chloride 0.9 % 100 mL IVPB     2 g 200 mL/hr over 30 Minutes Intravenous  Once 05/14/18 1512 05/14/18 1649   05/14/18 1515  metroNIDAZOLE (FLAGYL) IVPB 500 mg  Status:  Discontinued     500 mg 100 mL/hr over 60 Minutes Intravenous Every 8 hours 05/14/18 1512 05/19/18 1244      Procedures: 2/6>>PICC line 1/29>> exploratory laparotomy with small bowel anastomosis and closure of abdominal fascia 1/27>>Right IJ (Anesthesia) 1/27>>EXPLORATORY LAPAROTOMY,SMALL BOWEL RESECTION,CLOSURE WITH ABTHERA VAC  CONSULTS:  pulmonary/intensive care and general surgery  Time spent: 25- minutes-Greater than 50% of this time was spent in counseling,  explanation of diagnosis, planning of further management, and coordination of care.  MEDICATIONS: Scheduled Meds: . chlorhexidine  15 mL Mouth Rinse BID  . Chlorhexidine Gluconate Cloth  6 each Topical Daily  . enoxaparin (LOVENOX) injection  40 mg Subcutaneous Q24H  . feeding supplement  1 Container Oral QID  . fluticasone  2 spray Each Nare Daily  . guaiFENesin  600 mg Oral BID  . insulin aspart  0-20 Units Subcutaneous TID WC  . insulin glargine  24 Units Subcutaneous Daily  . lactose free nutrition  237 mL Oral TID WC  . mouth rinse  15 mL Mouth Rinse BID  . pantoprazole  40 mg Oral Daily  . potassium chloride  40 mEq Oral Once  . potassium chloride  40 mEq Oral Once  . venlafaxine XR  150 mg Oral Daily   Continuous Infusions:  PRN Meds:.acetaminophen, alum & mag hydroxide-simeth, fentaNYL (SUBLIMAZE) injection, iopamidol, ipratropium-albuterol, lip balm   PHYSICAL EXAM: Vital signs: Vitals:   06/02/18 1457 06/02/18 2112 06/03/18 0502 06/03/18 0512  BP: 133/79 (!) 152/95 137/69   Pulse: (!) 101 (!) 105 96   Resp: 16 18 20    Temp: 98.4 F (36.9 C) 99.1 F (37.3 C) 98.3 F (36.8 C)   TempSrc: Oral Oral Oral   SpO2: 94% 94% 98%   Weight:    114.2 kg  Height:       Filed Weights   05/31/18 0216 06/01/18 0500 06/03/18 0512  Weight: 113.8 kg 114.2 kg 114.2 kg   Body mass index is 44.6 kg/m.   Exam  Awake Alert, Oriented X 3, No new F.N deficits, Normal affect [place].AT,PERRAL Supple Neck,No JVD, No cervical lymphadenopathy appriciated.  Symmetrical Chest wall movement, Good air movement bilaterally, CTAB RRR,No Gallops, Rubs or new Murmurs, No Parasternal Heave +ve B.Sounds, Abd Soft, No tenderness, No organomegaly appriciated, No rebound - guarding or rigidity. No Cyanosis, Clubbing or edema, No new Rash or bruise    I have personally reviewed following labs and imaging studies  LABORATORY DATA: CBC: Recent Labs  Lab 05/28/18 0449 05/29/18 1207  05/30/18 0342 06/01/18 0404 06/02/18 0401 06/03/18 0356  WBC 8.1 6.3 5.9 6.4 7.0 7.8  NEUTROABS 6.2  --   --   --   --   --   HGB 8.4* 8.1* 7.8* 7.7* 8.8* 8.7*  HCT 27.8* 26.5* 24.7* 25.5* 28.7* 27.4*  MCV 85.0 84.4 84.6 84.2 84.2 84.8  PLT 281 284 271 278 290 809    Basic Metabolic Panel:  Recent Labs  Lab 05/28/18 0449 05/29/18 0440 05/29/18 1207 05/30/18 0342 05/31/18 0409 06/02/18 0401 06/03/18 0356  NA 139  --  139 141 140 143 141  K 3.6  --  3.6 3.7 3.6 3.3* 3.3*  CL 109  --  108 111 109 111 109  CO2 24  --  23 26 24 23 22   GLUCOSE 163*  --  156* 130* 137* 85 80  BUN 15  --  15 15 15 12 10   CREATININE 0.71  --  0.75 0.75 0.80 0.75 0.73  CALCIUM 7.6*  --  7.8* 7.6* 7.8* 7.8* 7.7*  MG 2.0  --   --  2.2 2.2  --  2.1  PHOS 1.9* 2.9  --   --  2.6  --   --     GFR: Estimated Creatinine Clearance: 93.3 mL/min (by C-G formula based on SCr of 0.73 mg/dL).  Liver Function Tests: Recent Labs  Lab 05/28/18 0449 05/31/18 0409  AST 36 94*  ALT 28 73*  ALKPHOS 78 90  BILITOT 0.2* 0.1*  PROT 5.7* 5.8*  ALBUMIN 2.0* 2.0*   No results for input(s): LIPASE, AMYLASE in the last 168 hours. No results for input(s): AMMONIA in the last 168 hours.  Coagulation Profile: No results for input(s): INR, PROTIME in the last 168 hours.  Cardiac Enzymes: No results for input(s): CKTOTAL, CKMB, CKMBINDEX, TROPONINI in the last 168 hours.  BNP (last 3 results) No results for input(s): PROBNP in the last 8760 hours.  HbA1C: No results for input(s): HGBA1C in the last 72 hours.  CBG: Recent Labs  Lab 06/02/18 0732 06/02/18 1143 06/02/18 1704 06/02/18 2110 06/03/18 0815  GLUCAP 106* 98 89 91 81    Lipid Profile: No results for input(s): CHOL, HDL, LDLCALC, TRIG, CHOLHDL, LDLDIRECT in the last 72 hours.  Thyroid Function Tests: No results for input(s): TSH, T4TOTAL, FREET4, T3FREE, THYROIDAB in the last 72 hours.  Anemia Panel: No results for input(s): VITAMINB12,  FOLATE, FERRITIN, TIBC, IRON, RETICCTPCT in the last 72 hours.  Urine analysis:    Component Value Date/Time   COLORURINE YELLOW 05/14/2018 2014   APPEARANCEUR CLOUDY (A) 05/14/2018 2014   LABSPEC 1.023 05/14/2018 2014   PHURINE 5.0 05/14/2018 2014   GLUCOSEU >=500 (A) 05/14/2018 2014   HGBUR MODERATE (A) 05/14/2018 2014   BILIRUBINUR NEGATIVE 05/14/2018 2014   KETONESUR 20 (A) 05/14/2018 2014   PROTEINUR 100 (A) 05/14/2018 2014   NITRITE NEGATIVE 05/14/2018 2014   LEUKOCYTESUR TRACE (A) 05/14/2018 2014    Sepsis Labs: Lactic Acid, Venous    Component Value Date/Time   LATICACIDVEN 1.5 05/27/2018 1701    MICROBIOLOGY: Recent Results (from the past 240 hour(s))  Culture, blood (routine x 2)     Status: None   Collection Time: 05/24/18  7:20 PM  Result Value Ref Range Status   Specimen Description BLOOD LEFT ANTECUBITAL  Final   Special Requests   Final    BOTTLES DRAWN AEROBIC AND ANAEROBIC Blood Culture adequate volume   Culture   Final    NO GROWTH 5 DAYS Performed at [facility], [place] [address] [place], [place] [address]    Report Status 05/29/2018 FINAL  Final  Culture, blood (routine x 2)     Status: None   Collection Time: 05/24/18 10:28 PM  Result Value Ref Range Status   Specimen Description BLOOD LEFT THUMB  Final   Special Requests   Final    BOTTLES DRAWN AEROBIC  ONLY Blood Culture adequate volume   Culture   Final    NO GROWTH 5 DAYS Performed at [facility], [place] [address] [place], [place] [address]    Report Status 05/30/2018 FINAL  Final  Culture, blood (routine x 2)     Status: None   Collection Time: 05/27/18 11:54 AM  Result Value Ref Range Status   Specimen Description BLOOD LEFT ANTECUBITAL  Final   Special Requests   Final    BOTTLES DRAWN AEROBIC ONLY Blood Culture results may not be optimal due to an inadequate volume of blood received in culture bottles   Culture   Final    NO GROWTH 5 DAYS Performed at [facility], [place] [address] [place], [place] [address]    Report Status 06/01/2018 FINAL  Final  Culture, blood (routine x 2)     Status: None   Collection Time: 05/27/18 12:04 PM  Result Value Ref Range Status   Specimen Description BLOOD LEFT ANTECUBITAL  Final   Special Requests   Final    BOTTLES DRAWN AEROBIC ONLY Blood Culture results may not be optimal due to an inadequate volume of blood received in culture bottles   Culture   Final    NO GROWTH 5 DAYS Performed at [facility], [place] [address], [place], [place] [address]    Report Status 06/01/2018 FINAL  Final  C difficile quick scan w PCR reflex     Status: None   Collection Time: 05/31/18  5:30 PM  Result Value Ref Range Status   C Diff antigen NEGATIVE NEGATIVE Final   C Diff toxin NEGATIVE NEGATIVE Final   C Diff interpretation No C. difficile detected.  Final    Comment: Performed at [facility], [place] [address] [place], [place] [address]    RADIOLOGY STUDIES/RESULTS: Ct Head Wo Contrast  Result Date: 05/18/2018 CLINICAL DATA:  Altered level of consciousness. Patient following commands intermittently. EXAM: CT HEAD WITHOUT CONTRAST TECHNIQUE: Contiguous axial images were obtained from the base of the skull through the vertex without intravenous contrast. COMPARISON:  None. FINDINGS: Brain: No evidence of acute infarction, hemorrhage, hydrocephalus, extra-axial collection or mass lesion/mass effect. Vascular: No hyperdense vessel or unexpected calcification. Skull: Normal. Negative for fracture or focal lesion. Sinuses/Orbits: Globes and orbits are unremarkable. There is mild mucosal thickening lining the ethmoid air cells and left sphenoid sinus with minimal right sphenoid sinus and maxillary sinus mucosal thickening. Other: None. IMPRESSION: 1. No intracranial abnormalities. 2. Sinus mucosal thickening. Electronically Signed   By: Lajean Manes M.D.   On: 05/18/2018 14:29   Ct Chest W Contrast  Result Date:  05/28/2018 CLINICAL DATA:  Chest congestion and nonproductive cough. EXAM: CT CHEST WITH CONTRAST TECHNIQUE: Multidetector CT imaging of the chest was performed during intravenous contrast administration. CONTRAST:  29mL OMNIPAQUE IOHEXOL 300 MG/ML  SOLN COMPARISON:  Chest radiographs, 05/28/2018 and prior exams. FINDINGS: Cardiovascular: Heart is normal in size. No pericardial effusion. Great vessels are normal in caliber. No aortic atherosclerosis. Mediastinum/Nodes: Mild prominence of the right thyroid lobe with areas of decreased attenuation. Suspect multiple nodules, not well-defined by CT. No neck base or axillary masses or enlarged lymph nodes. No mediastinal or hilar masses or adenopathy. Trachea is unremarkable. Mild distention of the distal esophagus versus a small hiatal hernia. There is a focus of increased attenuation within this, likely some ingested dense material. No esophageal mass or wall thickening. Lungs/Pleura: Trace right pleural effusion. Opacity  in the right lower lobe, most evident in the central lower lobe. This may reflect atelectasis or infection. There is a small area of consolidation right upper lobe centered on image 49, series 5. Small amount of dependent opacity is noted in the right upper lobe against the minor fissure, likely atelectasis. Additional areas hazy ground-glass type opacity in both lungs, mostly in the upper lobes and right middle lobe. This may be due to infection/inflammation or air trapping from airways disease. Low lung volumes accentuates these findings. No lung mass or nodule. No evidence of pulmonary edema. No pneumothorax. Upper Abdomen: No acute abnormality. Musculoskeletal: No chest wall abnormality. No acute or significant osseous findings. IMPRESSION: 1. Consolidation in the right lower lobe, which may reflect atelectasis or pneumonia. Suspect pneumonia at least as a component. There is a small area of consolidation in the right upper lobe, consistent with  an additional focus of pneumonia. Both lungs show areas of ground-glass opacity which may reflect additional infection/inflammation or be due to airways disease and air trapping. 2. No evidence of pulmonary edema. 3. Minimal right pleural effusion. Electronically Signed   By: Lajean Manes M.D.   On: 05/28/2018 11:25   Ct Abdomen Pelvis W Contrast  Result Date: 05/24/2018 CLINICAL DATA:  Postop course prolonged and complicated by ileus-on TNA-due to worsening leukocytosis. Patient underwent exploratory laparotomy on 05/16/2018 small bowel resection. Concern for ileus versus small bowel obstruction or abscess. Leukocytosis. Question of anastomotic leak. EXAM: CT ABDOMEN AND PELVIS WITH CONTRAST TECHNIQUE: Multidetector CT imaging of the abdomen and pelvis was performed using the standard protocol following bolus administration of intravenous contrast. CONTRAST:  157mL OMNIPAQUE IOHEXOL 300 MG/ML  SOLN COMPARISON:  CT of the abdomen and pelvis on 05/14/2018 FINDINGS: Lower chest: There is new consolidation at the RIGHT LOWER lobe. Subsegmental atelectasis is identified at the LEFT lung base. Hepatobiliary: Small cysts within the liver measures 1.1 centimeters. The gallbladder is present and contains faintly radiopaque calculus or calculi. Pancreas: Unremarkable. No pancreatic ductal dilatation or surrounding inflammatory changes. Spleen: Normal in size without focal abnormality. Adrenals/Urinary Tract: Adrenal glands are unremarkable. Kidneys are normal, without renal calculi, focal lesion, or hydronephrosis. Bladder is unremarkable. Stomach/Bowel: The stomach is normal in appearance. There has been interval resection of abnormal small bowel loop in the RIGHT LOWER QUADRANT. The anastomosis in the RIGHT LOWER QUADRANT appears unremarkable. There are persistent dilated loops of small bowel particularly in the central abdomen showing multiple caliber changes, suggestive of adhesions. Numerous air-fluid levels are  identified in the central small bowel loops. Although there is fluid in the mesentery and RIGHT LOWER QUADRANT, there is no evidence for anastomotic leak of contrast. No free intraperitoneal air. There are scattered colonic diverticula but no evidence for acute diverticulitis. There is central mesenteric edema. Colon is normal in caliber. Vascular/Lymphatic: No significant vascular findings are present. No enlarged abdominal or pelvic lymph nodes. Reproductive: Uterus is enlarged and contains numerous low-attenuation lesions, suggestive of degenerating fibroids. No adnexal mass. Other: No free pelvic fluid. Mesenteric ascites. Edematous changes identified within the anterior abdominal wall. Postoperative changes in the anterior abdomen. Musculoskeletal: No acute or significant osseous findings. IMPRESSION: 1. Interval resection of abnormal small bowel loop in the RIGHT LOWER QUADRANT. 2. Persistent dilated loops of small bowel in the central abdomen, suggestive of adhesions, and consistent with small bowel obstruction. 3. No evidence for anastomotic leak. 4. Mesenteric edema. 5. New consolidation at the RIGHT lung base. 6. Cholelithiasis. 7. Enlarged uterus containing numerous low-attenuation  lesions, suggestive of degenerating fibroids. Electronically Signed   By: Nolon Nations M.D.   On: 05/24/2018 16:06   Dg Chest Port 1 View  Result Date: 06/01/2018 CLINICAL DATA:  Shortness of breath. EXAM: PORTABLE CHEST 1 VIEW COMPARISON:  05/30/2018 FINDINGS: The right PICC line is stable. Persistent eventration of the right hemidiaphragm. The heart is borderline enlarged but unchanged. There is central vascular congestion but no overt pulmonary edema. Persistent bibasilar atelectasis. No definite pleural effusions. The bony thorax is intact. IMPRESSION: 1. Stable right PICC line. 2. Central vascular calcification without overt pulmonary edema. 3. Persistent bibasilar atelectasis. Electronically Signed   By: Marijo Sanes M.D.   On: 06/01/2018 10:37   Dg Chest Port 1 View  Result Date: 05/28/2018 CLINICAL DATA:  58 year old female with shortness breath. Diabetes. Subsequent encounter. EXAM: PORTABLE CHEST 1 VIEW COMPARISON:  05/27/2018 chest x-ray. 05/24/2018 CT abdomen and pelvis. FINDINGS: Persistent elevated right hemidiaphragm. Right mid to lower lobe consolidation may represent atelectasis or infiltrate and appears similar to prior exam. Central pulmonary vascular congestion. Mild cardiomegaly. No pneumothorax detected. Right PICC line tip mid superior vena cava level. No acute osseous abnormality. IMPRESSION: 1. Persistent elevated right hemidiaphragm. Right mid to lower lobe consolidation may represent atelectasis or infiltrate and appears similar to prior exam. 2. Central pulmonary vascular congestion. 3. Mild cardiomegaly. Electronically Signed   By: Genia Del M.D.   On: 05/28/2018 08:01   Dg Chest Port 1 View  Result Date: 05/27/2018 CLINICAL DATA:  Shortness of breath and cough EXAM: PORTABLE CHEST 1 VIEW COMPARISON:  Yesterday FINDINGS: Streaky opacities at the bases with elevated right diaphragm. Cardiomegaly. No Kerley lines or pneumothorax. Unremarkable right upper extremity PICC position. IMPRESSION: No change in atelectatic opacities at the bases, more extensive on the right. Electronically Signed   By: Monte Fantasia M.D.   On: 05/27/2018 09:12   Dg Chest Port 1 View  Result Date: 05/17/2018 CLINICAL DATA:  Endotracheally intubated. EXAM: PORTABLE CHEST 1 VIEW COMPARISON:  05/15/2018 FINDINGS: Endotracheal tube terminates 2.5 cm above the carina. Enteric tube courses into the abdomen with tip not imaged and side hole below the diaphragm. Right jugular catheter terminates near the superior cavoatrial junction. The cardiomediastinal silhouette is unchanged with normal heart size. Mild bibasilar opacities have slightly improved. No sizable pleural effusion or pneumothorax is identified.  IMPRESSION: Decreased, mild bibasilar atelectasis. Electronically Signed   By: Logan Bores M.D.   On: 05/17/2018 07:14   Dg Chest Port 1 View  Result Date: 05/15/2018 CLINICAL DATA:  Ventilator support EXAM: PORTABLE CHEST 1 VIEW COMPARISON:  05/14/2018 FINDINGS: Endotracheal tube tip is 2 cm above the carina. Nasogastric tube enters the stomach. Right internal jugular central line tip is in the proximal right atrium. No pneumothorax. Mild bilateral lower lobe atelectasis. IMPRESSION: Lines and tubes well positioned. Mild basilar atelectasis. Electronically Signed   By: Nelson Chimes M.D.   On: 05/15/2018 07:41   Dg Chest Portable 1 View  Result Date: 05/14/2018 CLINICAL DATA:  Abdominal pain for several days EXAM: PORTABLE CHEST 1 VIEW COMPARISON:  CT from earlier in the same day. FINDINGS: Elevation of the right hemidiaphragm is again seen. Cardiac shadow is within normal limits. Mild right basilar atelectatic changes are noted. No sizable effusion is seen. No bony abnormality is noted. IMPRESSION: Mild right basilar atelectasis. Electronically Signed   By: Inez Catalina M.D.   On: 05/14/2018 15:26   Dg Chest Port 1v Same Day  Result Date: 05/30/2018  CLINICAL DATA:  Shortness of breath, cough. EXAM: PORTABLE CHEST 1 VIEW COMPARISON:  Radiograph of May 14, 2018. CT scan of May 28, 2018. FINDINGS: Stable cardiomediastinal silhouette. Interval placement of right-sided PICC line with distal tip in expected position of the SVC. No pneumothorax or pleural effusion is noted. No consolidative process is noted. Bony thorax is unremarkable. IMPRESSION: No acute cardiopulmonary abnormality seen. Interval placement of right-sided PICC line as described above. Electronically Signed   By: Marijo Conception, M.D.   On: 05/30/2018 11:57   Dg Chest Port 1v Same Day  Result Date: 05/27/2018 CLINICAL DATA:  Shortness of breath, history diabetes mellitus, GERD, hypertension EXAM: PORTABLE CHEST 1 VIEW COMPARISON:   Portable exam 1624 hours compared to 05/27/2018 FINDINGS: Normal heart size, mediastinal contours, and pulmonary vascularity. RIGHT arm PICC line tip projects over SVC. Chronic elevation of RIGHT diaphragm with subsegmental atelectasis at RIGHT base. Central peribronchial thickening. No infiltrate, pleural effusion or pneumothorax. IMPRESSION: Bronchitic changes with persistent RIGHT basilar atelectasis. Electronically Signed   By: Lavonia Dana M.D.   On: 05/27/2018 16:59   Dg Chest Port 1v Same Day  Result Date: 05/26/2018 CLINICAL DATA:  58 year old female with shortness of breath. Recent abdominal surgery. EXAM: PORTABLE CHEST 1 VIEW COMPARISON:  Prior chest x-ray 05/17/2018 FINDINGS: The patient has been extubated and the nasogastric tube removed. Additionally, the right IJ central venous catheter has been removed. A right upper extremity PICC has been placed. The catheter tip overlies the distal SVC. Dense band of linear airspace opacity in the right lung obscuring the right cardiac margin favored to represent right middle lobar collapse/atelectasis. Additionally, there is linear atelectasis in the left lower lobe. No evidence of pulmonary edema or pneumothorax. No acute osseous abnormality. IMPRESSION: 1. Multifocal atelectasis including right middle lobe collapse. This may be secondary to mucous plugging. 2. Well-positioned right upper extremity approach PICC. Electronically Signed   By: Jacqulynn Cadet M.D.   On: 05/26/2018 10:19   Dg Abd Portable 1v  Result Date: 05/26/2018 CLINICAL DATA:  58 year old female with abdominal distension and shortness of breath EXAM: PORTABLE ABDOMEN - 1 VIEW COMPARISON:  CT abdomen/pelvis 05/24/2018 FINDINGS: Persistent diffuse gaseous distention of multiple loops of small bowel throughout the abdomen. No massive free air visualized on this supine radiograph. Lower lumbar degenerative disc disease again noted. IMPRESSION: Persistent gaseous distention of multiple loops  of small bowel throughout the abdomen most consistent with ileus. Electronically Signed   By: Jacqulynn Cadet M.D.   On: 05/26/2018 10:18   Dg Abd Portable 1v  Result Date: 05/24/2018 CLINICAL DATA:  Initial evaluation for acute vomiting, small bowel obstruction. EXAM: PORTABLE ABDOMEN - 1 VIEW COMPARISON:  Prior CT from 05/14/2018. FINDINGS: Multiple dilated gas-filled loops of small bowel seen within the left and mid abdomen, compatible with ongoing small bowel obstruction. Loops dilated up to approximately 4 cm in diameter. Paucity of gas distally. No soft tissue mass or abnormal calcification. No appreciable free air on these limited supine views of the abdomen. IMPRESSION: Multiple persistent gas-filled dilated loops of small bowel within the left and mid abdomen, compatible with ongoing small bowel obstruction. Electronically Signed   By: Jeannine Boga M.D.   On: 05/24/2018 06:00   Ct Renal Stone Study  Result Date: 05/14/2018 CLINICAL DATA:  58 y/o F; nausea, vomiting, and abdominal pain for 3 days. EXAM: CT ABDOMEN AND PELVIS WITHOUT CONTRAST TECHNIQUE: Multidetector CT imaging of the abdomen and pelvis was performed following the standard protocol  without IV contrast. COMPARISON:  None. FINDINGS: Lower chest: Platelike atelectasis in the lung bases. Hepatobiliary: No focal liver abnormality is seen. No gallstones, gallbladder wall thickening, or biliary dilatation. Pancreas: Unremarkable. No pancreatic ductal dilatation or surrounding inflammatory changes. Spleen: Normal in size without focal abnormality. Adrenals/Urinary Tract: Adrenal glands are unremarkable. Kidneys are normal, without renal calculi, focal lesion, or hydronephrosis. Bladder is unremarkable. Stomach/Bowel: Severe diffuse small-bowel obstruction with transition at the level of the distal ileum in the right lower quadrant (series 3, image 65). Mild sigmoid diverticulosis without findings of acute diverticulitis. Otherwise  normal appearance of the colon. Vascular/Lymphatic: Aortic atherosclerosis. No enlarged abdominal or pelvic lymph nodes. Reproductive: Negative. Other: There are a few tiny foci of pneumoperitoneum at the liver hilum, anterior abdominal wall, and below the diaphragm (series 3, image 12, 24, 27). Small volume of ascites. Musculoskeletal: No fracture is seen. Lumbar spondylosis with prominent facet arthropathy. IMPRESSION: 1. Severe diffuse small bowel obstruction with transition level of distal ileum in right lower quadrant. 2. Few punctate foci of pneumoperitoneum indicating small/micro perforation. Small volume of ascites. These results were called by telephone at the time of interpretation on 05/14/2018 at 3:27 pm to Dr. Andy Gauss, who verbally acknowledged these results. Electronically Signed   By: Kristine Garbe M.D.   On: 05/14/2018 15:34   Vas [place] Lower Extremity Venous (dvt)  Result Date: 06/01/2018  Lower Venous Study Indications: Edema.  Performing Technologist: June Leap RDMS, RVT  Examination Guidelines: A complete evaluation includes B-mode imaging, spectral Doppler, color Doppler, and power Doppler as needed of all accessible portions of each vessel. Bilateral testing is considered an integral part of a complete examination. Limited examinations for reoccurring indications may be performed as noted.  Right Venous Findings: +---------+---------------+---------+-----------+----------+-------+          CompressibilityPhasicitySpontaneityPropertiesSummary +---------+---------------+---------+-----------+----------+-------+ CFV      Full           Yes      Yes                          +---------+---------------+---------+-----------+----------+-------+ SFJ      Full                                                 +---------+---------------+---------+-----------+----------+-------+ FV Prox  Full                                                  +---------+---------------+---------+-----------+----------+-------+ FV Mid   Full                                                 +---------+---------------+---------+-----------+----------+-------+ FV DistalFull                                                 +---------+---------------+---------+-----------+----------+-------+ PFV      Full                                                 +---------+---------------+---------+-----------+----------+-------+  POP      Full           Yes      Yes                          +---------+---------------+---------+-----------+----------+-------+ PTV      Full                                                 +---------+---------------+---------+-----------+----------+-------+ PERO     Full                                                 +---------+---------------+---------+-----------+----------+-------+  Left Venous Findings: +---------+---------------+---------+-----------+----------+-------+          CompressibilityPhasicitySpontaneityPropertiesSummary +---------+---------------+---------+-----------+----------+-------+ CFV      Full           Yes      Yes                          +---------+---------------+---------+-----------+----------+-------+ SFJ      Full                                                 +---------+---------------+---------+-----------+----------+-------+ FV Prox  Full                                                 +---------+---------------+---------+-----------+----------+-------+ FV Mid   Full                                                 +---------+---------------+---------+-----------+----------+-------+ FV DistalFull                                                 +---------+---------------+---------+-----------+----------+-------+ PFV      Full                                                  +---------+---------------+---------+-----------+----------+-------+ POP      Full           Yes      Yes                          +---------+---------------+---------+-----------+----------+-------+ PTV      Full                                                 +---------+---------------+---------+-----------+----------+-------+  PERO     Full                                                 +---------+---------------+---------+-----------+----------+-------+    Summary: Right: There is no evidence of deep vein thrombosis in the lower extremity. No cystic structure found in the popliteal fossa. Left: There is no evidence of deep vein thrombosis in the lower extremity. No cystic structure found in the popliteal fossa.  *See table(s) above for measurements and observations.    Preliminary    [place] Ekg Site Rite  Result Date: 05/23/2018 If [facility] image not attached, placement could not be confirmed due to current cardiac rhythm.    LOS: 20 days   Signature  Lala Lund M.D on 06/03/2018 at 11:03 AM   -  To page go to [email or web address]

## 2018-06-04 LAB — CBC
HEMATOCRIT: 29.5 % — AB (ref 36.0–46.0)
HEMOGLOBIN: 8.9 g/dL — AB (ref 12.0–15.0)
MCH: 25.7 pg — ABNORMAL LOW (ref 26.0–34.0)
MCHC: 30.2 g/dL (ref 30.0–36.0)
MCV: 85.3 fL (ref 80.0–100.0)
Platelets: 349 10*3/uL (ref 150–400)
RBC: 3.46 MIL/uL — ABNORMAL LOW (ref 3.87–5.11)
RDW: 16.8 % — ABNORMAL HIGH (ref 11.5–15.5)
WBC: 7.5 10*3/uL (ref 4.0–10.5)
nRBC: 0 % (ref 0.0–0.2)

## 2018-06-04 LAB — MAGNESIUM: Magnesium: 2.1 mg/dL (ref 1.7–2.4)

## 2018-06-04 LAB — BASIC METABOLIC PANEL
ANION GAP: 8 (ref 5–15)
BUN: 9 mg/dL (ref 6–20)
CO2: 23 mmol/L (ref 22–32)
Calcium: 7.9 mg/dL — ABNORMAL LOW (ref 8.9–10.3)
Chloride: 110 mmol/L (ref 98–111)
Creatinine, Ser: 0.72 mg/dL (ref 0.44–1.00)
GFR calc Af Amer: >60 mL/min (ref >60)
GFR calc non Af Amer: >60 mL/min (ref >60)
Glucose, Bld: 84 mg/dL (ref 70–99)
Potassium: 2.9 mmol/L — ABNORMAL LOW (ref 3.5–5.1)
Sodium: 141 mmol/L (ref 135–145)

## 2018-06-04 LAB — PHOSPHORUS: Phosphorus: 2.5 mg/dL (ref 2.5–4.6)

## 2018-06-04 LAB — GLUCOSE, CAPILLARY: GLUCOSE-CAPILLARY: 110 mg/dL — AB (ref 70–99)

## 2018-06-04 MED ORDER — POTASSIUM CHLORIDE 20 MEQ PO PACK
40.0000 meq | PACK | Freq: Once | ORAL | Status: DC
  Filled 2018-06-04: qty 2

## 2018-06-04 MED ORDER — HEPARIN SOD (PORK) LOCK FLUSH 100 UNIT/ML IV SOLN: 250.0000 [IU] | INTRAVENOUS | Status: DC | PRN

## 2018-06-04 MED ORDER — POTASSIUM CHLORIDE 10 MEQ/100ML IV SOLN
10.0000 meq | INTRAVENOUS | Status: AC
  Administered 2018-06-04 (×4): 10 meq via INTRAVENOUS
  Filled 2018-06-04 (×4): qty 100

## 2018-06-04 MED ORDER — ACETAMINOPHEN 325 MG PO TABS: 650.0000 mg | ORAL_TABLET | Freq: Four times a day (QID) | ORAL | 0 refills | Status: DC | PRN

## 2018-06-04 MED ORDER — POTASSIUM CHLORIDE CRYS ER 10 MEQ PO TBCR
40.0000 meq | EXTENDED_RELEASE_TABLET | Freq: Once | ORAL | Status: AC
  Administered 2018-06-04: 40 meq via ORAL
  Filled 2018-06-04: qty 2
  Filled 2018-06-04: qty 4

## 2018-06-04 NOTE — Progress Notes (Signed)
PT Cancellation Note  Patient Details Name: Wendy [name] MRN: [id number] DOB: 1961-02-04   Cancelled Treatment:    Reason Eval/Treat Not Completed: Patient declined, no reason specified(Pt reports she is ready to d/c home she refused tx and reports she will not need AD at home.  RNCM reports she is already set up for RN/OT/PT in her home.  )   Cristela Blue 06/04/2018, 9:46 AM  Governor Rooks, PTA Acute Rehabilitation Services Pager [phone] Office [phone]

## 2018-06-04 NOTE — Progress Notes (Signed)
No new issues.  Patient surgically stable for DC home or to CIR from our standpoint.  Follow up and [place] arranged for VAC if she goes home.  Cont regular diet.  No new recommendations.  Henreitta Cea 8:49 AM 06/04/2018

## 2018-06-04 NOTE — Progress Notes (Signed)
Nsg Discharge Note  Admit Date:  05/14/2018 Discharge date: 06/04/2018   Wendy [name] to be D/C'd Home per MD order.  AVS completed.  Copy for chart, and copy for patient signed, and dated. Patient/caregiver able to verbalize understanding.  Discharge Medication: Allergies as of 06/04/2018      Reactions   Penicillins Swelling   Did it involve swelling of the face/tongue/throat, SOB, or low BP? Yes Did it involve sudden or severe rash/hives, skin peeling, or any reaction on the inside of your mouth or nose? No Did you need to seek medical attention at a hospital or doctor's office? Yes When did it last happen? unknown If all above answers are "NO", may proceed with cephalosporin use.      Medication List    TAKE these medications   acetaminophen 325 MG tablet Commonly known as:  TYLENOL Take 2 tablets (650 mg total) by mouth every 6 (six) hours as needed for mild pain or headache.   ASPIRIN LOW DOSE 81 MG chewable tablet Generic drug:  aspirin Chew 1 tablet by mouth daily.   atorvastatin 40 MG tablet Commonly known as:  LIPITOR Take 1 tablet (40 mg total) by mouth every evening.   Empagliflozin-metFORMIN HCl ER 25-1000 MG Tb24 Commonly known as:  SYNJARDY XR Take 1 tablet by mouth daily.   fluticasone 50 MCG/ACT nasal spray Commonly known as:  FLONASE Place 2 sprays into both nostrils daily. What changed:    when to take this  reasons to take this   insulin degludec 100 UNIT/ML Sopn FlexTouch Pen Commonly known as:  TRESIBA INJECT 20 TO 50 UNITS INTO THE SKIN DAILY What changed:  See the new instructions.   Insulin Pen Needle 32G X 6 MM Misc Commonly known as:  NOVOFINE 1 each by Does not apply route daily.   ONE TOUCH ULTRA 2 w/Device Kit See admin instructions.   ONE TOUCH ULTRA TEST test strip Generic drug:  glucose blood CHECK FASTING BLOOD SUGARS TWICE DAILY   ONETOUCH DELICA LANCETS FINE Misc See admin instructions.   telmisartan-hydrochlorothiazide  80-25 MG tablet Commonly known as:  MICARDIS HCT TAKE 1 TABLET BY MOUTH DAILY. *INS ONLY PAYS FOR 30 DAYS* What changed:    how much to take  how to take this  when to take this  additional instructions   valACYclovir 500 MG tablet Commonly known as:  VALTREX Take 1 tablet (500 mg total) by mouth 3 (three) times daily as needed. And daily for prevention What changed:    reasons to take this  additional instructions   venlafaxine XR 150 MG 24 hr capsule Commonly known as:  EFFEXOR-XR Take 1 capsule (150 mg total) by mouth daily.   Vitamin D 50 MCG (2000 UT) Caps Take 1 capsule (2,000 Units total) by mouth daily.            Durable Medical Equipment  (From admission, onward)         Start     Ordered   06/01/18 1224  For home use only DME Vac  Once     06/01/18 1223          Discharge Assessment: Vitals:   06/03/18 2042 06/04/18 0520  BP: 117/67 132/75  Pulse: (!) 110 97  Resp: 17 18  Temp: 98.3 F (36.8 C) 98.4 F (36.9 C)  SpO2: 98% 95%   Skin clean, dry and intact without evidence of skin break down, no evidence of skin tears noted. IV catheter discontinued  intact. Site without signs and symptoms of complications - no redness or edema noted at insertion site, patient denies c/o pain - only slight tenderness at site.  Dressing with slight pressure applied.  D/c Instructions-Education: Discharge instructions given to patient/family with verbalized understanding. D/c education completed with patient/family including follow up instructions, medication list, d/c activities limitations if indicated, with other d/c instructions as indicated by MD - patient able to verbalize understanding, all questions fully answered. Patient instructed to return to ED, call 911, or call MD for any changes in condition.  Patient escorted via [place], and D/C home via private auto.  Tresa Endo, RN 06/04/2018 12:17 PM

## 2018-06-04 NOTE — Progress Notes (Signed)
Occupational Therapy Treatment Patient Details Name: Wendy [name] MRN: [id number] DOB: 1960-12-05 Today's Date: 06/04/2018    History of present illness 58 year old female who presented with 3-day history of abdominal pain, emesis, anorexia, and polyuria for the past 3 days.  Labs notable hyperglycemia 771 with anion gap of 21.  CT abdomen pelvis showed small bowel obstruction with pneumoperitoneum and she underwent ex lap with small bowel resection.   OT comments  Pt has met established acute OT goals and continues to progress toward baseline, plan of care updated. Pt currently requires S for functional mobility with ADL, completing grooming task as sink. Due to functional limitations (see OT problem list) limiting her independence and safety with ADL and functional mobility pt will continue to benefit from skilled OT services following d/c. All acute needs identified and acute education complete, pt and caregiver with no additional questions or concerns. All additional OT needs to be addressed at d/c venue. Acute OT to sign off. Thank you.    Follow Up Recommendations  Home health OT;Supervision/Assistance - 24 hour    Equipment Recommendations       Recommendations for Other Services      Precautions / Restrictions Precautions Precautions: Fall Restrictions Weight Bearing Restrictions: No       Mobility Bed Mobility               General bed mobility comments: sitting EOB upon arrival  Transfers Overall transfer level: Needs assistance     Sit to Stand: Supervision         General transfer comment: S for safety and line management    Balance Overall balance assessment: Needs assistance Sitting-balance support: Feet supported;No upper extremity supported Sitting balance-Leahy Scale: Good     Standing balance support: Single extremity supported;During functional activity Standing balance-Leahy Scale: Fair Standing balance comment: reliant on single-bilateral UE  support on sink throughout grooming task                           ADL either performed or assessed with clinical judgement   ADL Overall ADL's : Needs assistance/impaired     Grooming: Oral care;Supervision/safety;Standing Grooming Details (indicate cue type and reason): sink level                  Toilet Transfer: Min guard;Ambulation Toilet Transfer Details (indicate cue type and reason): minguard for safety and line management         Functional mobility during ADLs: Min guard General ADL Comments: educated pt on energy conservation strategies to utilize during ADL as pt reports she gets tired easily;pt with limited participation this session     Vision       Perception     Praxis      Cognition Arousal/Alertness: Awake/alert Behavior During Therapy: Flat affect;Agitated Overall Cognitive Status: Within Functional Limits for tasks assessed                                 General Comments: pt unenthusiastically agreeable to session;appeared agitated throughout session with self-limiting behaviors;        Exercises     Shoulder Instructions       General Comments pt's husband present during session    Pertinent Vitals/ Pain       Pain Assessment: No/denies pain  Home Living  Prior Functioning/Environment              Frequency  Min 2X/week        Progress Toward Goals  OT Goals(current goals can now be found in the care plan section)  Progress towards OT goals: Goals met and updated - see care plan;Goals met/education completed, patient discharged from OT  Acute Rehab OT Goals Patient Stated Goal: Return to independence OT Goal Formulation: With patient Time For Goal Achievement: 06/04/18 Potential to Achieve Goals: Good ADL Goals Pt Will Perform Grooming: with modified independence;standing Pt Will Perform Upper Body Dressing: with modified  independence Pt Will Perform Lower Body Dressing: with modified independence;sit to/from stand Pt Will Transfer to Toilet: with modified independence Pt/caregiver will Perform Home Exercise Program: Increased ROM;Increased strength;Both right and left upper extremity;With written HEP provided;With Supervision  Plan Discharge plan remains appropriate    Co-evaluation                 AM-PAC OT "6 Clicks" Daily Activity     Outcome Measure   Help from another person eating meals?: None Help from another person taking care of personal grooming?: A Little Help from another person toileting, which includes using toliet, bedpan, or urinal?: A Little Help from another person bathing (including washing, rinsing, drying)?: A Little Help from another person to put on and taking off regular upper body clothing?: A Little Help from another person to put on and taking off regular lower body clothing?: A Little 6 Click Score: 19    End of Session    OT Visit Diagnosis: Unsteadiness on feet (R26.81);Other abnormalities of gait and mobility (R26.89);Muscle weakness (generalized) (M62.81);Pain   Activity Tolerance Patient tolerated treatment well   Patient Left in bed;with call bell/phone within reach;with nursing/sitter in room;with family/visitor present   Nurse Communication Mobility status        Time: 0479-9872 OT Time Calculation (min): 21 min  Charges: OT General Charges $OT Visit: 1 Visit OT Treatments $Self Care/Home Management : 8-22 mins  Dorinda Hill OTR/L Acute Rehabilitation Services Office: [place] 06/04/2018, 11:35 AM

## 2018-06-04 NOTE — Progress Notes (Signed)
PICC line removed per MD order (theraoy completed).  PICC line measured 39 cm. Vaseline gauze applied to insertion site. Patient educated to stay on bedrest for 45 mins before going home to prevent emboli migration.  Patient verbalized understanding.

## 2018-06-04 NOTE — Discharge Instructions (Signed)
[facility]      [facility] °[phone] ° °OPEN ABDOMINAL SURGERY: POST OP INSTRUCTIONS ° °Always review your discharge instruction sheet given to you by the facility where your surgery was performed. ° °IF YOU HAVE DISABILITY OR FAMILY LEAVE FORMS, YOU MUST BRING THEM TO THE OFFICE FOR PROCESSING.  PLEASE DO NOT GIVE THEM TO YOUR DOCTOR. ° °1. A prescription for pain medication may be given to you upon discharge.  Take your pain medication as prescribed, if needed.  If narcotic pain medicine is not needed, then you may take acetaminophen (Tylenol) or ibuprofen (Advil) as needed. °2. Take your usually prescribed medications unless otherwise directed. °3. If you need a refill on your pain medication, please contact your pharmacy. They will contact our office to request authorization.  Prescriptions will not be filled after 5pm or on week-ends. °4. You should follow a light diet the first few days after arrival home, such as soup and crackers, pudding, etc.unless your doctor has advised otherwise. A high-fiber, low fat diet can be resumed as tolerated.   Be sure to include lots of fluids daily. Most patients will experience some swelling and bruising on the chest and neck area.  Ice packs will help.  Swelling and bruising can take several days to resolve °5. Most patients will experience some swelling and bruising in the area of the incision. Ice pack will help. Swelling and bruising can take several days to resolve..  °6. It is common to experience some constipation if taking pain medication after surgery.  Increasing fluid intake and taking a stool softener will usually help or prevent this problem from occurring.  A mild laxative (Milk of Magnesia or Miralax) should be taken according to package directions if there are no bowel movements after 48 hours. °7.  You may have steri-strips (small skin tapes) in place directly over the incision.  These strips should be left on the skin for 7-10 days.  If your  surgeon used skin glue on the incision, you may shower in 24 hours.  The glue will flake off over the next 2-3 weeks.  Any sutures or staples will be removed at the office during your follow-up visit. You may find that a light gauze bandage over your incision may keep your staples from being rubbed or pulled. You may shower and replace the bandage daily. °8. ACTIVITIES:  You may resume regular (light) daily activities beginning the next day--such as daily self-care, walking, climbing stairs--gradually increasing activities as tolerated.  You may have sexual intercourse when it is comfortable.  Refrain from any heavy lifting or straining until approved by your doctor. °a. You may drive when you no longer are taking prescription pain medication, you can comfortably wear a seatbelt, and you can safely maneuver your car and apply brakes °b. Return to Work: ___________________________________ °9. You should see your doctor in the office for a follow-up appointment approximately two weeks after your surgery.  Make sure that you call for this appointment within a day or two after you arrive home to insure a convenient appointment time. °OTHER INSTRUCTIONS:  °_____________________________________________________________ °_____________________________________________________________ ° °WHEN TO CALL YOUR DOCTOR: °1. Fever over 101.0 °2. Inability to urinate °3. Nausea and/or vomiting °4. Extreme swelling or bruising °5. Continued bleeding from incision. °6. Increased pain, redness, or drainage from the incision. °7. Difficulty swallowing or breathing °8. Muscle cramping or spasms. °9. Numbness or tingling in hands or feet or around lips. ° °The clinic staff is available to   answer your questions during regular business hours.  Please don’t hesitate to call and ask to speak to one of the nurses if you have concerns. ° °For further questions, please visit [email or web address] ° °••••••••• ° ° °Managing Your Pain After  Surgery Without Opioids ° ° ° °Thank you for participating in our program to help patients manage their pain after surgery without opioids. This is part of our effort to provide you with the best care possible, without exposing you or your family to the risk that opioids pose. ° °What pain can I expect after surgery? °You can expect to have some pain after surgery. This is normal. The pain is typically worse the day after surgery, and quickly begins to get better. °Many studies have found that many patients are able to manage their pain after surgery with Over-the-Counter (OTC) medications such as Tylenol and Motrin. If you have a condition that does not allow you to take Tylenol or Motrin, notify your surgical team. ° °How will I manage my pain? °The best strategy for controlling your pain after surgery is around the clock pain control with Tylenol (acetaminophen) and Motrin (ibuprofen or Advil). Alternating these medications with each other allows you to maximize your pain control. In addition to Tylenol and Motrin, you can use heating pads or ice packs on your incisions to help reduce your pain. ° °How will I alternate your regular strength over-the-counter pain medication? °You will take a dose of pain medication every three hours. °; Start by taking 650 mg of Tylenol (2 pills of 325 mg) °; 3 hours later take 600 mg of Motrin (3 pills of 200 mg) °; 3 hours after taking the Motrin take 650 mg of Tylenol °; 3 hours after that take 600 mg of Motrin. ° ° °- 1 - ° °See example - if your first dose of Tylenol is at 12:00 PM ° ° °12:00 PM Tylenol 650 mg (2 pills of 325 mg)  °3:00 PM Motrin 600 mg (3 pills of 200 mg)  °6:00 PM Tylenol 650 mg (2 pills of 325 mg)  °9:00 PM Motrin 600 mg (3 pills of 200 mg)  °Continue alternating every 3 hours  ° °We recommend that you follow this schedule around-the-clock for at least 3 days after surgery, or until you feel that it is no longer needed. Use the table on the last page of  this handout to keep track of the medications you are taking. °Important: °Do not take more than 3000mg of Tylenol or 3200mg of Motrin in a 24-hour period. °Do not take ibuprofen/Motrin if you have a history of bleeding stomach ulcers, severe kidney disease, &/or actively taking a blood thinner ° °What if I still have pain? °If you have pain that is not controlled with the over-the-counter pain medications (Tylenol and Motrin or Advil) you might have what we call “breakthrough” pain. You will receive a prescription for a small amount of an opioid pain medication such as Oxycodone, Tramadol, or Tylenol with Codeine. Use these opioid pills in the first 24 hours after surgery if you have breakthrough pain. Do not take more than 1 pill every 4-6 hours. ° °If you still have uncontrolled pain after using all opioid pills, don't hesitate to call our staff using the number provided. We will help make sure you are managing your pain in the best way possible, and if necessary, we can provide a prescription for additional pain medication. ° ° °Day 1   ° °Time  °  Name of Medication Number of pills taken  Amount of Acetaminophen  Pain Level   Comments  AM PM       AM PM       AM PM       AM PM       AM PM       AM PM       AM PM       AM PM       Total Daily amount of Acetaminophen Do not take more than  3,000 mg per day      Day 2    Time  Name of Medication Number of pills taken  Amount of Acetaminophen  Pain Level   Comments  AM PM       AM PM       AM PM       AM PM       AM PM       AM PM       AM PM       AM PM       Total Daily amount of Acetaminophen Do not take more than  3,000 mg per day      Day 3    Time  Name of Medication Number of pills taken  Amount of Acetaminophen  Pain Level   Comments  AM PM       AM PM       AM PM       AM PM          AM PM       AM PM       AM PM       AM PM       Total Daily amount of Acetaminophen Do not take more than  3,000 mg per  day      Day 4    Time  Name of Medication Number of pills taken  Amount of Acetaminophen  Pain Level   Comments  AM PM       AM PM       AM PM       AM PM       AM PM       AM PM       AM PM       AM PM       Total Daily amount of Acetaminophen Do not take more than  3,000 mg per day      Day 5    Time  Name of Medication Number of pills taken  Amount of Acetaminophen  Pain Level   Comments  AM PM       AM PM       AM PM       AM PM       AM PM       AM PM       AM PM       AM PM       Total Daily amount of Acetaminophen Do not take more than  3,000 mg per day       Day 6    Time  Name of Medication Number of pills taken  Amount of Acetaminophen  Pain Level  Comments  AM PM       AM PM       AM PM       AM PM       AM   PM       AM PM       AM PM       AM PM       Total Daily amount of Acetaminophen Do not take more than  3,000 mg per day      Day 7    Time  Name of Medication Number of pills taken  Amount of Acetaminophen  Pain Level   Comments  AM PM       AM PM       AM PM       AM PM       AM PM       AM PM       AM PM       AM PM       Total Daily amount of Acetaminophen Do not take more than  3,000 mg per day        For additional information about how and where to safely dispose of unused opioid medications - [employer]  Disclaimer: This document contains information and/or instructional materials adapted from [place] for the typical patient with your condition. It does not replace medical advice from your health care provider because your experience may differ from that of the typical patient. Talk to your health care provider if you have any questions about this document, your condition or your treatment plan. Adapted from [place] Negative pressure wound therapy (NPWT) is a device that helps wounds heal. NPWT helps the wound  stay clean and healthy while it heals from the inside. NPWT uses a bandage (dressing) that is made of a sponge or gauze-like material. The dressing is placed in or inside the wound. The wound is then covered and sealed with a cover dressing that sticks to your skin (adhesive). This keeps air out. A tube connects the cover dressing to a small pump. The pump sucks fluid and germs from the wound. The pump also controls any odor coming from the wound. What are the risks? NPWT is usually safe to use. The most common problem is skin irritation from the dressing adhesive, but there are many ways to prevent this from happening. However, more serious problems can develop, such as:  Bleeding.  Infection.  Dehydration.  Pain. How to change your dressing How often you change your dressing depends on your wound. If the pump is off for more than two hours, the dressing will need to be changed. Follow your health care providers instructions on how often to change it. Your health care provider may change your dressing, or a family member, friend, or caregiver may be shown how to change the dressing. It is important to:  Wear gloves and protective clothing while changing a dressing. This may include eye protection.  Never let anyone change your dressing if he or she has an infection, skin condition, or skin wound or cut of any size. Preparing to change your dressing  If needed, take pain medicine 30 minutes before the dressing change as prescribed by your health care provider.  Set up a clean station for wound care. You will need: ? A disposable garbage bag that is open and ready to use. ? Hand sanitizer. ? Wound cleanser or saltwater solution (saline) as told by your health care provider. ? New dressing material or bandages. Make sure to open the dressing package so that the dressing remains on the inside of the package. You may also  need the following in your clean station:  A box of vinyl  gloves.  Tape.  Skin protectant. This may be a wipe, film, or spray.  Clean or germ-free (sterile) scissors.  Wound liner.  Cotton tip applicators. Removing your old dressing  Wash your hands with soap and water. Dry your hands with a clean towel. If soap and water are not available, use hand sanitizer.  Put on gloves.  Turn off the pump and disconnect the tubing from the dressing.  Carefully remove the adhesive cover dressing in the direction of your hair growth. Only touch the outside edges of the dressing.  Remove the dressing that is inside the wound. If the dressing sticks, use a wound cleanser or saline solution to wet the dressing. This helps it come off more easily.  Throw the old dressing supplies into the ready garbage bag.  Remove your gloves by grabbing the cuff and turning the glove inside out. Place the gloves in the trash immediately.  Wash your hands with soap and water. Dry your hands with a clean towel. If soap and water are not available, use hand sanitizer. Cleaning your wound  Follow your health care provider's instructions on how to clean your wound. This may include using a saline or recommended wound cleanser.  Do not use over-the-counter medicated or antiseptic creams, sprays, liquids, or dressings unless told to do so by your health care provider.  Clean the area thoroughly with the recommended saline solution or wound cleanser and a clean gauze pad.  Throw the gauze pad into the garbage bag.  Wash your hands with soap and water. Dry your hands with a clean towel. If soap and water are not available, use hand sanitizer. Applying the dressing  Apply a skin protectant to any skin that will be exposed to adhesive. Let the skin protectant dry.  Put a new dressing into the wound.  Apply a new cover dressing and tube.  Take off your gloves. Put them in the plastic bag with the old dressing. Tie the bag shut and throw it away.  Wash your hands with  soap and water. Dry your hands with a clean towel. If soap and water are not available, use hand sanitizer.  Attach the suction and turn the pump back on. Do not change the settings on the machine without talking to a health care provider.  Replace the container in the pump that collects fluid if it is full. Do this at least once a week. Contact a health care provider if:  You have new pain.  You develop irritation, a rash, or itching around the wound or dressing.  You see new black or yellow tissue in your wound.  The dressing changes are painful or cause bleeding.  The pump has been off for more than two hours and you do not know how to change the dressing.  The alarm for the pump goes off and you do not know what to do. Get help right away if:  You have a lot of bleeding.  You see a sudden change in the color or texture of the drainage.  The wound breaks open.  You have severe pain.  You have signs of infection, such as: ? More redness, swelling, or pain. ? More fluid or blood. ? Warmth. ? Pus or a bad smell. ? Red streaks leading from wound. ? A fever. This information is not intended to replace advice given to you by your health care provider. Make sure  you discuss any questions you have with your health care provider. Document Released: 06/27/2011 Document Revised: 04/30/2015 Document Reviewed: 01/08/2015 Elsevier Interactive Patient Education  2019 [place]    Follow with Primary MD Steele Sizer, MD in 7 days   Get CBC, CMP, Magnesium checked  by Primary MD in 5-7 days   Activity: As tolerated with Full fall precautions use walker/cane & assistance as needed  Disposition Home    Diet: Heart Healthy - Low Carb - check CBGs QAC-HS   Special Instructions: If you have smoked or chewed Tobacco  in the last 2 yrs please stop smoking, stop any regular Alcohol  and or any Recreational drug use.  On your next visit with your primary care physician please  Get Medicines reviewed and adjusted.  Please request your Prim.MD to go over all Hospital Tests and Procedure/Radiological results at the follow up, please get all Hospital records sent to your Prim MD by signing hospital release before you go home.  If you experience worsening of your admission symptoms, develop shortness of breath, life threatening emergency, suicidal or homicidal thoughts you must seek medical attention immediately by calling 911 or calling your MD immediately  if symptoms less severe.  You Must read complete instructions/literature along with all the possible adverse reactions/side effects for all the Medicines you take and that have been prescribed to you. Take any new Medicines after you have completely understood and accpet all the possible adverse reactions/side effects.

## 2018-06-04 NOTE — Care Management Note (Addendum)
Case Management Note  Patient Details  Name: Wendy [name] MRN: [id number] Date of Birth: [birth year]  Subjective/Objective:      Presented with  sepsis secondary to small bowel obstruction and small perforation, along with DKA.       [name] (Spouse)     [phone]                   PCP: Steele Sizer    S/P EXPLORATORY LAPAROTOMY with reanatomosis and fascia closure with wound vacum application, 0/53     Action/Plan: Transition to home today with wound vac. Husband to provide transportation to home.  Expected Discharge Date:  06/04/18               Expected Discharge Plan:  [place]  In-House Referral:  NA  Discharge planning Services  CM Consult  Post Acute Care Choice:  NA Choice offered to:  Patient  DME Arranged:  Vac DME Agency:  [facility]  HH Arranged:  RN, PT [place] Agency:  [facility]  Status of Service:  completed  If discussed at Long Length of Stay Meetings, dates discussed:    Additional Comments:  Sharin Mons, RN 06/04/2018, 9:38 AM

## 2018-06-04 NOTE — Discharge Summary (Signed)
Wendy [name]:[id number] DOB: 29-Aug-1960 DOA: 05/14/2018  PCP: Steele Sizer, MD  Admit date: 05/14/2018  Discharge date: 06/04/2018  Admitted From: Home   Disposition:  Home   Recommendations for Outpatient Follow-up:   Follow up with PCP in 1-2 weeks  PCP Please obtain BMP/CBC, 2 view CXR in 1week,  (see Discharge instructions)   PCP Please follow up on the following pending results: Monitor BMP closely   Home Health: RN,PT   Equipment/Devices: W.Vac  Consultations: CCS Discharge Condition: Stable   CODE STATUS: Full   Diet Recommendation: Heart Healthy Low Carb   Chief Complaint  Patient presents with  . Abdominal Pain  . Hyperglycemia     Brief history of present illness from the day of admission and additional interim summary    Patient is a 58 y.o. female with history of DM-2, pretension, GERD, morbid obesity who presented with a 3-day history of vomiting and abdominal pain-found to have sepsis secondary to small bowel obstruction and small perforation, along with DKA.  She was started on IV insulin, empiric antimicrobial therapy-General surgery was consulted-and patient was admitted to the ICU under the PCCM service.  Hospital course lately has been complicated by development of ileus, pneumonia, and some intermittent diarrhea (C. difficile PCR negative)                                                                 Hospital Course    Acute hypoxic respiratory failure: Extubated on 2/1 (intubated for surgery) stable on room air no further issues.  Septic shock secondary to peritonitis with bowel obstruction along with Clostridium bacteremia: Sepsis pathophysiology has resolved, blood cultures positive for Clostridium species.  Peritoneal fluid cultures positive for enterococcus and Clostridium as  well.  Has completed a course of meropenem till 2/8.   Small bowel obstruction with perforation s/p resection and closure of the abdominal fascia with prolonged postop course due to ileus: Clinically much improved and ileus seems to have resolved, TNA was stopped on 06/02/2018, tolerating diet well without any acute issues Case discussed with general surgery will be discharged home with home health RN and PT.    To continue with wound VAC changes Monday Wednesday Friday, case management has already arranged for home RN and wound VAC.  Wound VAC instructions given in writing by general surgery.  Fever secondary to right-sided pneumonia:  Completely resolved.   Anemia: Secondary to acute/critical illness-no evidence of blood loss.  She required 1 unit of packed RBC this admission now H&H is stable.  Follow with PCP for age-appropriate outpatient follow-up and anemia work-up.   Hypertension: Blood pressure relatively well controlled-continue home regimen.    GERD: Continue PPI  Dyslipidemia: Resume statin when oral intake is further stabilized  Type II DM with uncontrolled hyperglycemia:  Resume  home regimen and follow with PCP.  Hypokalemia.  Has been aggressively replaced, follow with PCP to get a repeat BMP check.   Discharge diagnosis     Principal Problem:   Small bowel obstruction (HCC) Active Problems:   Essential (primary) hypertension   Extreme obesity   Uncontrolled type 2 diabetes mellitus with nonproliferative retinopathy (HCC)   Mild nonproliferative diabetic retinopathy of both eyes associated with type 2 diabetes mellitus (HCC)   DKA (diabetic ketoacidoses) (HCC)   Acute respiratory failure with hypoxemia (HCC)   Pressure injury of skin    Discharge instructions    Discharge Instructions    Discharge instructions   Complete by:  As directed    Follow with Primary MD Steele Sizer, MD in 7 days   Get CBC, CMP, Magnesium checked  by Primary MD in 5-7  days   Activity: As tolerated with Full fall precautions use walker/cane & assistance as needed  Disposition Home    Diet: Heart Healthy - Low Carb, CBGs QAC-HS  Special Instructions: If you have smoked or chewed Tobacco  in the last 2 yrs please stop smoking, stop any regular Alcohol  and or any Recreational drug use.  On your next visit with your primary care physician please Get Medicines reviewed and adjusted.  Please request your Prim.MD to go over all Hospital Tests and Procedure/Radiological results at the follow up, please get all Hospital records sent to your Prim MD by signing hospital release before you go home.  If you experience worsening of your admission symptoms, develop shortness of breath, life threatening emergency, suicidal or homicidal thoughts you must seek medical attention immediately by calling 911 or calling your MD immediately  if symptoms less severe.  You Must read complete instructions/literature along with all the possible adverse reactions/side effects for all the Medicines you take and that have been prescribed to you. Take any new Medicines after you have completely understood and accpet all the possible adverse reactions/side effects.   Increase activity slowly   Complete by:  As directed       Discharge Medications   Allergies as of 06/04/2018      Reactions   Penicillins Swelling   Did it involve swelling of the face/tongue/throat, SOB, or low BP? Yes Did it involve sudden or severe rash/hives, skin peeling, or any reaction on the inside of your mouth or nose? No Did you need to seek medical attention at a hospital or doctor's office? Yes When did it last happen? unknown If all above answers are "NO", may proceed with cephalosporin use.      Medication List    TAKE these medications   acetaminophen 325 MG tablet Commonly known as:  TYLENOL Take 2 tablets (650 mg total) by mouth every 6 (six) hours as needed for mild pain or headache.     ASPIRIN LOW DOSE 81 MG chewable tablet Generic drug:  aspirin Chew 1 tablet by mouth daily.   atorvastatin 40 MG tablet Commonly known as:  LIPITOR Take 1 tablet (40 mg total) by mouth every evening.   Empagliflozin-metFORMIN HCl ER 25-1000 MG Tb24 Commonly known as:  SYNJARDY XR Take 1 tablet by mouth daily.   fluticasone 50 MCG/ACT nasal spray Commonly known as:  FLONASE Place 2 sprays into both nostrils daily. What changed:    when to take this  reasons to take this   insulin degludec 100 UNIT/ML Sopn FlexTouch Pen Commonly known as:  TRESIBA INJECT 20 TO 50 UNITS INTO  THE SKIN DAILY What changed:  See the new instructions.   Insulin Pen Needle 32G X 6 MM Misc Commonly known as:  NOVOFINE 1 each by Does not apply route daily.   ONE TOUCH ULTRA 2 w/Device Kit See admin instructions.   ONE TOUCH ULTRA TEST test strip Generic drug:  glucose blood CHECK FASTING BLOOD SUGARS TWICE DAILY   ONETOUCH DELICA LANCETS FINE Misc See admin instructions.   telmisartan-hydrochlorothiazide 80-25 MG tablet Commonly known as:  MICARDIS HCT TAKE 1 TABLET BY MOUTH DAILY. *INS ONLY PAYS FOR 30 DAYS* What changed:    how much to take  how to take this  when to take this  additional instructions   valACYclovir 500 MG tablet Commonly known as:  VALTREX Take 1 tablet (500 mg total) by mouth 3 (three) times daily as needed. And daily for prevention What changed:    reasons to take this  additional instructions   venlafaxine XR 150 MG 24 hr capsule Commonly known as:  EFFEXOR-XR Take 1 capsule (150 mg total) by mouth daily.   Vitamin D 50 MCG (2000 UT) Caps Take 1 capsule (2,000 Units total) by mouth daily.            Durable Medical Equipment  (From admission, onward)         Start     Ordered   06/01/18 1224  For home use only DME Vac  Once     06/01/18 1223          Follow-up Information    Georganna Skeans, MD Follow up on 06/20/2018.   Specialty:   General Surgery Why:  11:00am, arrive no later than 10:30am for paperwork and check in.  please bring photo ID and insurance card Contact information: [address] [place] [address] [phone]        [facility], [facility]. Call.   Specialty:  Home Health Services Why:  Home health services arranged Contact information: [address] [place] [address] [phone]        Steele Sizer, MD. Schedule an appointment as soon as possible for a visit in 1 week(s).   Specialty:  Family Medicine Contact information: [address] [place] [address] [phone]           Major procedures and Radiology Reports - PLEASE review detailed and final reports thoroughly  -      Significant Events: 1/27 Admit > OR for ex lap with small bowel resection> ICU 1/29 OR for anastomosis and closure 2/1 Extubated 2/2 Hypoactive delirium, worsening respiratory status 2/8>>Right PNA  Ct Head Wo Contrast  Result Date: 05/18/2018 CLINICAL DATA:  Altered level of consciousness. Patient following commands intermittently. EXAM: CT HEAD WITHOUT CONTRAST TECHNIQUE: Contiguous axial images were obtained from the base of the skull through the vertex without intravenous contrast. COMPARISON:  None. FINDINGS: Brain: No evidence of acute infarction, hemorrhage, hydrocephalus, extra-axial collection or mass lesion/mass effect. Vascular: No hyperdense vessel or unexpected calcification. Skull: Normal. Negative for fracture or focal lesion. Sinuses/Orbits: Globes and orbits are unremarkable. There is mild mucosal thickening lining the ethmoid air cells and left sphenoid sinus with minimal right sphenoid sinus and maxillary sinus mucosal thickening. Other: None. IMPRESSION: 1. No intracranial abnormalities. 2. Sinus mucosal thickening. Electronically Signed   By: Lajean Manes M.D.   On: 05/18/2018 14:29   Ct Chest W Contrast  Result Date: 05/28/2018 CLINICAL  DATA:  Chest congestion and nonproductive cough.  EXAM: CT CHEST WITH CONTRAST TECHNIQUE: Multidetector CT imaging of the chest was performed during intravenous contrast administration. CONTRAST:  43m OMNIPAQUE IOHEXOL 300 MG/ML  SOLN COMPARISON:  Chest radiographs, 05/28/2018 and prior exams. FINDINGS: Cardiovascular: Heart is normal in size. No pericardial effusion. Great vessels are normal in caliber. No aortic atherosclerosis. Mediastinum/Nodes: Mild prominence of the right thyroid lobe with areas of decreased attenuation. Suspect multiple nodules, not well-defined by CT. No neck base or axillary masses or enlarged lymph nodes. No mediastinal or hilar masses or adenopathy. Trachea is unremarkable. Mild distention of the distal esophagus versus a small hiatal hernia. There is a focus of increased attenuation within this, likely some ingested dense material. No esophageal mass or wall thickening. Lungs/Pleura: Trace right pleural effusion. Opacity in the right lower lobe, most evident in the central lower lobe. This may reflect atelectasis or infection. There is a small area of consolidation right upper lobe centered on image 49, series 5. Small amount of dependent opacity is noted in the right upper lobe against the minor fissure, likely atelectasis. Additional areas hazy ground-glass type opacity in both lungs, mostly in the upper lobes and right middle lobe. This may be due to infection/inflammation or air trapping from airways disease. Low lung volumes accentuates these findings. No lung mass or nodule. No evidence of pulmonary edema. No pneumothorax. Upper Abdomen: No acute abnormality. Musculoskeletal: No chest wall abnormality. No acute or significant osseous findings. IMPRESSION: 1. Consolidation in the right lower lobe, which may reflect atelectasis or pneumonia. Suspect pneumonia at least as a component. There is a small area of consolidation in the right upper lobe, consistent with an additional focus  of pneumonia. Both lungs show areas of ground-glass opacity which may reflect additional infection/inflammation or be due to airways disease and air trapping. 2. No evidence of pulmonary edema. 3. Minimal right pleural effusion. Electronically Signed   By: DLajean ManesM.D.   On: 05/28/2018 11:25   Ct Abdomen Pelvis W Contrast  Result Date: 05/24/2018 CLINICAL DATA:  Postop course prolonged and complicated by ileus-on TNA-due to worsening leukocytosis. Patient underwent exploratory laparotomy on 05/16/2018 small bowel resection. Concern for ileus versus small bowel obstruction or abscess. Leukocytosis. Question of anastomotic leak. EXAM: CT ABDOMEN AND PELVIS WITH CONTRAST TECHNIQUE: Multidetector CT imaging of the abdomen and pelvis was performed using the standard protocol following bolus administration of intravenous contrast. CONTRAST:  1078mOMNIPAQUE IOHEXOL 300 MG/ML  SOLN COMPARISON:  CT of the abdomen and pelvis on 05/14/2018 FINDINGS: Lower chest: There is new consolidation at the RIGHT LOWER lobe. Subsegmental atelectasis is identified at the LEFT lung base. Hepatobiliary: Small cysts within the liver measures 1.1 centimeters. The gallbladder is present and contains faintly radiopaque calculus or calculi. Pancreas: Unremarkable. No pancreatic ductal dilatation or surrounding inflammatory changes. Spleen: Normal in size without focal abnormality. Adrenals/Urinary Tract: Adrenal glands are unremarkable. Kidneys are normal, without renal calculi, focal lesion, or hydronephrosis. Bladder is unremarkable. Stomach/Bowel: The stomach is normal in appearance. There has been interval resection of abnormal small bowel loop in the RIGHT LOWER QUADRANT. The anastomosis in the RIGHT LOWER QUADRANT appears unremarkable. There are persistent dilated loops of small bowel particularly in the central abdomen showing multiple caliber changes, suggestive of adhesions. Numerous air-fluid levels are identified in the  central small bowel loops. Although there is fluid in the mesentery and RIGHT LOWER QUADRANT, there is no evidence for anastomotic leak of contrast. No free intraperitoneal air. There are scattered colonic diverticula but no  evidence for acute diverticulitis. There is central mesenteric edema. Colon is normal in caliber. Vascular/Lymphatic: No significant vascular findings are present. No enlarged abdominal or pelvic lymph nodes. Reproductive: Uterus is enlarged and contains numerous low-attenuation lesions, suggestive of degenerating fibroids. No adnexal mass. Other: No free pelvic fluid. Mesenteric ascites. Edematous changes identified within the anterior abdominal wall. Postoperative changes in the anterior abdomen. Musculoskeletal: No acute or significant osseous findings. IMPRESSION: 1. Interval resection of abnormal small bowel loop in the RIGHT LOWER QUADRANT. 2. Persistent dilated loops of small bowel in the central abdomen, suggestive of adhesions, and consistent with small bowel obstruction. 3. No evidence for anastomotic leak. 4. Mesenteric edema. 5. New consolidation at the RIGHT lung base. 6. Cholelithiasis. 7. Enlarged uterus containing numerous low-attenuation lesions, suggestive of degenerating fibroids. Electronically Signed   By: Nolon Nations M.D.   On: 05/24/2018 16:06   Dg Chest Port 1 View  Result Date: 06/01/2018 CLINICAL DATA:  Shortness of breath. EXAM: PORTABLE CHEST 1 VIEW COMPARISON:  05/30/2018 FINDINGS: The right PICC line is stable. Persistent eventration of the right hemidiaphragm. The heart is borderline enlarged but unchanged. There is central vascular congestion but no overt pulmonary edema. Persistent bibasilar atelectasis. No definite pleural effusions. The bony thorax is intact. IMPRESSION: 1. Stable right PICC line. 2. Central vascular calcification without overt pulmonary edema. 3. Persistent bibasilar atelectasis. Electronically Signed   By: Marijo Sanes M.D.   On:  06/01/2018 10:37   Dg Chest Port 1 View  Result Date: 05/28/2018 CLINICAL DATA:  58 year old female with shortness breath. Diabetes. Subsequent encounter. EXAM: PORTABLE CHEST 1 VIEW COMPARISON:  05/27/2018 chest x-ray. 05/24/2018 CT abdomen and pelvis. FINDINGS: Persistent elevated right hemidiaphragm. Right mid to lower lobe consolidation may represent atelectasis or infiltrate and appears similar to prior exam. Central pulmonary vascular congestion. Mild cardiomegaly. No pneumothorax detected. Right PICC line tip mid superior vena cava level. No acute osseous abnormality. IMPRESSION: 1. Persistent elevated right hemidiaphragm. Right mid to lower lobe consolidation may represent atelectasis or infiltrate and appears similar to prior exam. 2. Central pulmonary vascular congestion. 3. Mild cardiomegaly. Electronically Signed   By: Genia Del M.D.   On: 05/28/2018 08:01   Dg Chest Port 1 View  Result Date: 05/27/2018 CLINICAL DATA:  Shortness of breath and cough EXAM: PORTABLE CHEST 1 VIEW COMPARISON:  Yesterday FINDINGS: Streaky opacities at the bases with elevated right diaphragm. Cardiomegaly. No Kerley lines or pneumothorax. Unremarkable right upper extremity PICC position. IMPRESSION: No change in atelectatic opacities at the bases, more extensive on the right. Electronically Signed   By: Monte Fantasia M.D.   On: 05/27/2018 09:12   Dg Chest Port 1 View  Result Date: 05/17/2018 CLINICAL DATA:  Endotracheally intubated. EXAM: PORTABLE CHEST 1 VIEW COMPARISON:  05/15/2018 FINDINGS: Endotracheal tube terminates 2.5 cm above the carina. Enteric tube courses into the abdomen with tip not imaged and side hole below the diaphragm. Right jugular catheter terminates near the superior cavoatrial junction. The cardiomediastinal silhouette is unchanged with normal heart size. Mild bibasilar opacities have slightly improved. No sizable pleural effusion or pneumothorax is identified. IMPRESSION: Decreased, mild  bibasilar atelectasis. Electronically Signed   By: Logan Bores M.D.   On: 05/17/2018 07:14   Dg Chest Port 1 View  Result Date: 05/15/2018 CLINICAL DATA:  Ventilator support EXAM: PORTABLE CHEST 1 VIEW COMPARISON:  05/14/2018 FINDINGS: Endotracheal tube tip is 2 cm above the carina. Nasogastric tube enters the stomach. Right internal jugular central line tip is  in the proximal right atrium. No pneumothorax. Mild bilateral lower lobe atelectasis. IMPRESSION: Lines and tubes well positioned. Mild basilar atelectasis. Electronically Signed   By: Nelson Chimes M.D.   On: 05/15/2018 07:41   Dg Chest Portable 1 View  Result Date: 05/14/2018 CLINICAL DATA:  Abdominal pain for several days EXAM: PORTABLE CHEST 1 VIEW COMPARISON:  CT from earlier in the same day. FINDINGS: Elevation of the right hemidiaphragm is again seen. Cardiac shadow is within normal limits. Mild right basilar atelectatic changes are noted. No sizable effusion is seen. No bony abnormality is noted. IMPRESSION: Mild right basilar atelectasis. Electronically Signed   By: Inez Catalina M.D.   On: 05/14/2018 15:26   Dg Chest Port 1v Same Day  Result Date: 05/30/2018 CLINICAL DATA:  Shortness of breath, cough. EXAM: PORTABLE CHEST 1 VIEW COMPARISON:  Radiograph of May 14, 2018. CT scan of May 28, 2018. FINDINGS: Stable cardiomediastinal silhouette. Interval placement of right-sided PICC line with distal tip in expected position of the SVC. No pneumothorax or pleural effusion is noted. No consolidative process is noted. Bony thorax is unremarkable. IMPRESSION: No acute cardiopulmonary abnormality seen. Interval placement of right-sided PICC line as described above. Electronically Signed   By: Marijo Conception, M.D.   On: 05/30/2018 11:57   Dg Chest Port 1v Same Day  Result Date: 05/27/2018 CLINICAL DATA:  Shortness of breath, history diabetes mellitus, GERD, hypertension EXAM: PORTABLE CHEST 1 VIEW COMPARISON:  Portable exam 1624 hours  compared to 05/27/2018 FINDINGS: Normal heart size, mediastinal contours, and pulmonary vascularity. RIGHT arm PICC line tip projects over SVC. Chronic elevation of RIGHT diaphragm with subsegmental atelectasis at RIGHT base. Central peribronchial thickening. No infiltrate, pleural effusion or pneumothorax. IMPRESSION: Bronchitic changes with persistent RIGHT basilar atelectasis. Electronically Signed   By: Lavonia Dana M.D.   On: 05/27/2018 16:59   Dg Chest Port 1v Same Day  Result Date: 05/26/2018 CLINICAL DATA:  58 year old female with shortness of breath. Recent abdominal surgery. EXAM: PORTABLE CHEST 1 VIEW COMPARISON:  Prior chest x-ray 05/17/2018 FINDINGS: The patient has been extubated and the nasogastric tube removed. Additionally, the right IJ central venous catheter has been removed. A right upper extremity PICC has been placed. The catheter tip overlies the distal SVC. Dense band of linear airspace opacity in the right lung obscuring the right cardiac margin favored to represent right middle lobar collapse/atelectasis. Additionally, there is linear atelectasis in the left lower lobe. No evidence of pulmonary edema or pneumothorax. No acute osseous abnormality. IMPRESSION: 1. Multifocal atelectasis including right middle lobe collapse. This may be secondary to mucous plugging. 2. Well-positioned right upper extremity approach PICC. Electronically Signed   By: Jacqulynn Cadet M.D.   On: 05/26/2018 10:19   Dg Abd Portable 1v  Result Date: 05/26/2018 CLINICAL DATA:  58 year old female with abdominal distension and shortness of breath EXAM: PORTABLE ABDOMEN - 1 VIEW COMPARISON:  CT abdomen/pelvis 05/24/2018 FINDINGS: Persistent diffuse gaseous distention of multiple loops of small bowel throughout the abdomen. No massive free air visualized on this supine radiograph. Lower lumbar degenerative disc disease again noted. IMPRESSION: Persistent gaseous distention of multiple loops of small bowel throughout  the abdomen most consistent with ileus. Electronically Signed   By: Jacqulynn Cadet M.D.   On: 05/26/2018 10:18   Dg Abd Portable 1v  Result Date: 05/24/2018 CLINICAL DATA:  Initial evaluation for acute vomiting, small bowel obstruction. EXAM: PORTABLE ABDOMEN - 1 VIEW COMPARISON:  Prior CT from 05/14/2018. FINDINGS: Multiple dilated  gas-filled loops of small bowel seen within the left and mid abdomen, compatible with ongoing small bowel obstruction. Loops dilated up to approximately 4 cm in diameter. Paucity of gas distally. No soft tissue mass or abnormal calcification. No appreciable free air on these limited supine views of the abdomen. IMPRESSION: Multiple persistent gas-filled dilated loops of small bowel within the left and mid abdomen, compatible with ongoing small bowel obstruction. Electronically Signed   By: Jeannine Boga M.D.   On: 05/24/2018 06:00   Ct Renal Stone Study  Result Date: 05/14/2018 CLINICAL DATA:  58 y/o F; nausea, vomiting, and abdominal pain for 3 days. EXAM: CT ABDOMEN AND PELVIS WITHOUT CONTRAST TECHNIQUE: Multidetector CT imaging of the abdomen and pelvis was performed following the standard protocol without IV contrast. COMPARISON:  None. FINDINGS: Lower chest: Platelike atelectasis in the lung bases. Hepatobiliary: No focal liver abnormality is seen. No gallstones, gallbladder wall thickening, or biliary dilatation. Pancreas: Unremarkable. No pancreatic ductal dilatation or surrounding inflammatory changes. Spleen: Normal in size without focal abnormality. Adrenals/Urinary Tract: Adrenal glands are unremarkable. Kidneys are normal, without renal calculi, focal lesion, or hydronephrosis. Bladder is unremarkable. Stomach/Bowel: Severe diffuse small-bowel obstruction with transition at the level of the distal ileum in the right lower quadrant (series 3, image 65). Mild sigmoid diverticulosis without findings of acute diverticulitis. Otherwise normal appearance of the  colon. Vascular/Lymphatic: Aortic atherosclerosis. No enlarged abdominal or pelvic lymph nodes. Reproductive: Negative. Other: There are a few tiny foci of pneumoperitoneum at the liver hilum, anterior abdominal wall, and below the diaphragm (series 3, image 12, 24, 27). Small volume of ascites. Musculoskeletal: No fracture is seen. Lumbar spondylosis with prominent facet arthropathy. IMPRESSION: 1. Severe diffuse small bowel obstruction with transition level of distal ileum in right lower quadrant. 2. Few punctate foci of pneumoperitoneum indicating small/micro perforation. Small volume of ascites. These results were called by telephone at the time of interpretation on 05/14/2018 at 3:27 pm to Dr. Andy Gauss, who verbally acknowledged these results. Electronically Signed   By: Kristine Garbe M.D.   On: 05/14/2018 15:34   Vas [place] Lower Extremity Venous (dvt)  Result Date: 06/03/2018  Lower Venous Study Indications: Edema.  Performing Technologist: June Leap RDMS, RVT  Examination Guidelines: A complete evaluation includes B-mode imaging, spectral Doppler, color Doppler, and power Doppler as needed of all accessible portions of each vessel. Bilateral testing is considered an integral part of a complete examination. Limited examinations for reoccurring indications may be performed as noted.  Right Venous Findings: +---------+---------------+---------+-----------+----------+-------+          CompressibilityPhasicitySpontaneityPropertiesSummary +---------+---------------+---------+-----------+----------+-------+ CFV      Full           Yes      Yes                          +---------+---------------+---------+-----------+----------+-------+ SFJ      Full                                                 +---------+---------------+---------+-----------+----------+-------+ FV Prox  Full                                                  +---------+---------------+---------+-----------+----------+-------+  FV Mid   Full                                                 +---------+---------------+---------+-----------+----------+-------+ FV DistalFull                                                 +---------+---------------+---------+-----------+----------+-------+ PFV      Full                                                 +---------+---------------+---------+-----------+----------+-------+ POP      Full           Yes      Yes                          +---------+---------------+---------+-----------+----------+-------+ PTV      Full                                                 +---------+---------------+---------+-----------+----------+-------+ PERO     Full                                                 +---------+---------------+---------+-----------+----------+-------+  Left Venous Findings: +---------+---------------+---------+-----------+----------+-------+          CompressibilityPhasicitySpontaneityPropertiesSummary +---------+---------------+---------+-----------+----------+-------+ CFV      Full           Yes      Yes                          +---------+---------------+---------+-----------+----------+-------+ SFJ      Full                                                 +---------+---------------+---------+-----------+----------+-------+ FV Prox  Full                                                 +---------+---------------+---------+-----------+----------+-------+ FV Mid   Full                                                 +---------+---------------+---------+-----------+----------+-------+ FV DistalFull                                                 +---------+---------------+---------+-----------+----------+-------+  PFV      Full                                                  +---------+---------------+---------+-----------+----------+-------+ POP      Full           Yes      Yes                          +---------+---------------+---------+-----------+----------+-------+ PTV      Full                                                 +---------+---------------+---------+-----------+----------+-------+ PERO     Full                                                 +---------+---------------+---------+-----------+----------+-------+    Summary: Right: There is no evidence of deep vein thrombosis in the lower extremity. No cystic structure found in the popliteal fossa. Left: There is no evidence of deep vein thrombosis in the lower extremity. No cystic structure found in the popliteal fossa.  *See table(s) above for measurements and observations. Electronically signed by Ruta Hinds MD on 06/03/2018 at 2:23:18 PM.    Final    [place] Ekg Site Rite  Result Date: 05/23/2018 If [facility] image not attached, placement could not be confirmed due to current cardiac rhythm.   Micro Results    Recent Results (from the past 240 hour(s))  Culture, blood (routine x 2)     Status: None   Collection Time: 05/27/18 11:54 AM  Result Value Ref Range Status   Specimen Description BLOOD LEFT ANTECUBITAL  Final   Special Requests   Final    BOTTLES DRAWN AEROBIC ONLY Blood Culture results may not be optimal due to an inadequate volume of blood received in culture bottles   Culture   Final    NO GROWTH 5 DAYS Performed at [facility] [address] [place], [place] [address]    Report Status 06/01/2018 FINAL  Final  Culture, blood (routine x 2)     Status: None   Collection Time: 05/27/18 12:04 PM  Result Value Ref Range Status   Specimen Description BLOOD LEFT ANTECUBITAL  Final   Special Requests   Final    BOTTLES DRAWN AEROBIC ONLY Blood Culture results may not be optimal due to an inadequate volume of blood received in culture bottles   Culture   Final      NO GROWTH 5 DAYS Performed at [facility], [place] [address], [place], [place] [address]    Report Status 06/01/2018 FINAL  Final  C difficile quick scan w PCR reflex     Status: None   Collection Time: 05/31/18  5:30 PM  Result Value Ref Range Status   C Diff antigen NEGATIVE NEGATIVE Final   C Diff toxin NEGATIVE NEGATIVE Final   C Diff interpretation No C. difficile detected.  Final    Comment: Performed at [facility], [place]  [address], [place], [place] [address]    Today   Subjective    Wendy [name] today has no headache,no chest abdominal pain,no new weakness tingling or numbness, feels much better wants to go home today.    Objective   Blood pressure 132/75, pulse 97, temperature 98.4 F (36.9 C), temperature source Oral, resp. rate 18, height 5' 3" (1.6 m), weight 113.4 kg, last menstrual period 01/26/2017, SpO2 95 %.   Intake/Output Summary (Last 24 hours) at 06/04/2018 0909 Last data filed at 06/04/2018 0655 Gross per 24 hour  Intake 1040 ml  Output 75 ml  Net 965 ml    Exam  Awake Alert, Oriented x 3, No new F.N deficits, Normal affect [place].AT,PERRAL Supple Neck,No JVD, No cervical lymphadenopathy appriciated.  Symmetrical Chest wall movement, Good air movement bilaterally, CTAB RRR,No Gallops,Rubs or new Murmurs, No Parasternal Heave +ve B.Sounds, Abd Soft, Non tender, No organomegaly appriciated, No rebound -guarding or rigidity. No Cyanosis, Clubbing or edema, No new Rash or bruise   Data Review   CBC w Diff:  Lab Results  Component Value Date   WBC 7.5 06/04/2018   HGB 8.9 (L) 06/04/2018   HGB 12.3 12/25/2014   HCT 29.5 (L) 06/04/2018   HCT 37.2 12/25/2014   PLT 349 06/04/2018   PLT 237 12/25/2014   LYMPHOPCT 13 05/28/2018   BANDSPCT 2 05/14/2018   MONOPCT 9 05/28/2018   EOSPCT 0 05/28/2018   BASOPCT 0 05/28/2018    CMP:  Lab Results  Component Value Date   NA 141 06/04/2018   NA 144 12/25/2014   K 2.9 (L) 06/04/2018   CL 110  06/04/2018   CO2 23 06/04/2018   BUN 9 06/04/2018   BUN 9 12/25/2014   CREATININE 0.72 06/04/2018   CREATININE 0.69 05/25/2017   PROT 5.8 (L) 05/31/2018   PROT 6.7 12/25/2014   ALBUMIN 2.0 (L) 05/31/2018   ALBUMIN 4.4 12/25/2014   BILITOT 0.1 (L) 05/31/2018   BILITOT 0.3 12/25/2014   ALKPHOS 90 05/31/2018   AST 94 (H) 05/31/2018   ALT 73 (H) 05/31/2018  .   Total Time in preparing paper work, data evaluation and todays exam - 46 minutes  Lala Lund M.D on 06/04/2018 at 9:09 AM  [facility]   Office  [phone]

## 2018-06-05 ENCOUNTER — Telehealth: Payer: Self-pay

## 2018-06-05 NOTE — Telephone Encounter (Signed)
Attempted to reach patient for TCM call and confirm appt for hospital follow up.   Left msg for patient to call at [phone]

## 2018-06-06 ENCOUNTER — Ambulatory Visit: Payer: Self-pay | Admitting: *Deleted

## 2018-06-06 DIAGNOSIS — Z48815 Encounter for surgical aftercare following surgery on the digestive system: Secondary | ICD-10-CM | POA: Diagnosis not present

## 2018-06-06 DIAGNOSIS — I10 Essential (primary) hypertension: Secondary | ICD-10-CM | POA: Diagnosis not present

## 2018-06-06 DIAGNOSIS — E119 Type 2 diabetes mellitus without complications: Secondary | ICD-10-CM | POA: Diagnosis not present

## 2018-06-06 NOTE — Telephone Encounter (Signed)
Pt questioning DM medications since hospitalization 05/14/2018. States she thinks medication was changed. Also states her BS was low when hospitalized and was told she may not need insulin. TN attempted to gain additional information from patient. Difficult to hear pt as pt's phone breaking up. Pt states she was in bed and could not move to another room.  Patient asking to speak to [place] at practice. Please advise: [phone]  Answer Assessment - Initial Assessment Questions 1. SYMPTOMS: "Do you have any symptoms?"     no 2. SEVERITY: If symptoms are present, ask "Are they mild, moderate or severe?"     N/A  Protocols used: MEDICATION QUESTION CALL-A-AH

## 2018-06-06 NOTE — Telephone Encounter (Signed)
Called patient in regards to her questions about her DM medications. Asked patient to hold on while I consulted with PCP in regards to her questions, when I came back patient had hung up. Tried to call back twice no answer, lvm.  PCP confirms that patient should start back on medication that she prescribed her and follow up soon.

## 2018-06-07 DIAGNOSIS — E119 Type 2 diabetes mellitus without complications: Secondary | ICD-10-CM | POA: Diagnosis not present

## 2018-06-07 DIAGNOSIS — Z48815 Encounter for surgical aftercare following surgery on the digestive system: Secondary | ICD-10-CM | POA: Diagnosis not present

## 2018-06-07 DIAGNOSIS — I10 Essential (primary) hypertension: Secondary | ICD-10-CM | POA: Diagnosis not present

## 2018-06-07 NOTE — Telephone Encounter (Signed)
Second attempt to reach patient. Phone straight to voicemail. Left message for patient to call back for TCM and schedule hospital follow up.

## 2018-06-08 DIAGNOSIS — Z48815 Encounter for surgical aftercare following surgery on the digestive system: Secondary | ICD-10-CM | POA: Diagnosis not present

## 2018-06-08 DIAGNOSIS — E119 Type 2 diabetes mellitus without complications: Secondary | ICD-10-CM | POA: Diagnosis not present

## 2018-06-08 DIAGNOSIS — I10 Essential (primary) hypertension: Secondary | ICD-10-CM | POA: Diagnosis not present

## 2018-06-11 ENCOUNTER — Other Ambulatory Visit: Payer: Self-pay

## 2018-06-11 ENCOUNTER — Ambulatory Visit: Payer: 59 | Admitting: Family Medicine

## 2018-06-11 MED ORDER — ONETOUCH ULTRA 2 W/DEVICE KIT: 1.0000 | PACK | 0 refills | Status: DC

## 2018-06-11 NOTE — Telephone Encounter (Signed)
Refill request for general medication. Meter, Test Strips and lancets   Last office visit 03/27/2018   Follow up on 07/17/2018

## 2018-06-12 DIAGNOSIS — I10 Essential (primary) hypertension: Secondary | ICD-10-CM | POA: Diagnosis not present

## 2018-06-12 DIAGNOSIS — Z48815 Encounter for surgical aftercare following surgery on the digestive system: Secondary | ICD-10-CM | POA: Diagnosis not present

## 2018-06-12 DIAGNOSIS — E119 Type 2 diabetes mellitus without complications: Secondary | ICD-10-CM | POA: Diagnosis not present

## 2018-06-15 DIAGNOSIS — Z48815 Encounter for surgical aftercare following surgery on the digestive system: Secondary | ICD-10-CM | POA: Diagnosis not present

## 2018-06-15 DIAGNOSIS — I10 Essential (primary) hypertension: Secondary | ICD-10-CM | POA: Diagnosis not present

## 2018-06-15 DIAGNOSIS — E119 Type 2 diabetes mellitus without complications: Secondary | ICD-10-CM | POA: Diagnosis not present

## 2018-06-17 DIAGNOSIS — T8189XA Other complications of procedures, not elsewhere classified, initial encounter: Secondary | ICD-10-CM | POA: Diagnosis not present

## 2018-06-18 ENCOUNTER — Other Ambulatory Visit: Payer: Self-pay

## 2018-06-18 DIAGNOSIS — E119 Type 2 diabetes mellitus without complications: Secondary | ICD-10-CM | POA: Diagnosis not present

## 2018-06-18 DIAGNOSIS — I10 Essential (primary) hypertension: Secondary | ICD-10-CM | POA: Diagnosis not present

## 2018-06-18 DIAGNOSIS — Z48815 Encounter for surgical aftercare following surgery on the digestive system: Secondary | ICD-10-CM | POA: Diagnosis not present

## 2018-06-18 MED ORDER — GLUCOSE BLOOD VI STRP: ORAL_STRIP | 2 refills | Status: DC

## 2018-06-18 NOTE — Telephone Encounter (Signed)
Refill request for diabetic medication:   One Touch Ultra Test Strips  Last office visit pertaining to diabetes: 02/06/2018  Lab Results  Component Value Date   HGBA1C 8.2 (A) 02/06/2018   Follow-ups on file. 06/21/2018

## 2018-06-20 DIAGNOSIS — Z48815 Encounter for surgical aftercare following surgery on the digestive system: Secondary | ICD-10-CM | POA: Diagnosis not present

## 2018-06-20 DIAGNOSIS — E119 Type 2 diabetes mellitus without complications: Secondary | ICD-10-CM | POA: Diagnosis not present

## 2018-06-20 DIAGNOSIS — I10 Essential (primary) hypertension: Secondary | ICD-10-CM | POA: Diagnosis not present

## 2018-06-21 ENCOUNTER — Encounter: Payer: Self-pay | Admitting: Family Medicine

## 2018-06-21 ENCOUNTER — Ambulatory Visit: Payer: 59 | Admitting: Family Medicine

## 2018-06-21 VITALS — BP 128/72 | HR 97 | Temp 97.8°F | Resp 20 | Ht 63.0 in | Wt 241.0 lb

## 2018-06-21 DIAGNOSIS — E440 Moderate protein-calorie malnutrition: Secondary | ICD-10-CM | POA: Diagnosis not present

## 2018-06-21 DIAGNOSIS — Z8719 Personal history of other diseases of the digestive system: Secondary | ICD-10-CM

## 2018-06-21 DIAGNOSIS — Z09 Encounter for follow-up examination after completed treatment for conditions other than malignant neoplasm: Secondary | ICD-10-CM

## 2018-06-21 DIAGNOSIS — Z9049 Acquired absence of other specified parts of digestive tract: Secondary | ICD-10-CM | POA: Diagnosis not present

## 2018-06-21 DIAGNOSIS — E876 Hypokalemia: Secondary | ICD-10-CM

## 2018-06-21 DIAGNOSIS — E1121 Type 2 diabetes mellitus with diabetic nephropathy: Secondary | ICD-10-CM | POA: Diagnosis not present

## 2018-06-21 DIAGNOSIS — R9389 Abnormal findings on diagnostic imaging of other specified body structures: Secondary | ICD-10-CM

## 2018-06-21 NOTE — Progress Notes (Signed)
Name: Wendy [name]   MRN: [id number]    DOB: Nov 25, 1960   Date:06/21/2018       Progress Note  Subjective  Chief Complaint  Chief Complaint  Patient presents with  . Hospitalization Follow-up  . Bowel Obstruction    HPI  Hospital Discharge: she developed acute onset of abdominal associated with vomiting on 05/11/2017 on 05/14/2017 symptoms were so severe with projectile vomiting that  she went to [facility]. She was in DKA with glucose of 711. CT abdomen showed severe diffuse  small bowel obstruction and pneumoperitoneum. She had emergent exploratory laparotomy. Surgical report negative for cancer, but she had a fatty nodule that may have caused intussusception and the volvulus. During her hospital stay her potassium was very low, she also had a drop of GFR down to 20's , white count on admission was 22.5, pre-albumin was down to 10.1. She lost 21  lbs in the past month , prior to discharge CT showed consolidation on right lung field, WBC was normal, she was anemia with hgb of 8.9%. She has resumed her home medications since discharge on 06/04/2018 . She states her appetite is finally returning , normal bowel movements, she denies cough but has noticed decrease in exercise tolerance . She states glucose has improved, but can spike to 200's when fasting but goes down to 125 after she eats. She denies polyphagia, polydipsia or polyuria .   Patient Active Problem List   Diagnosis Date Noted  . Pressure injury of skin 05/26/2018  . Small bowel obstruction ([place]) 05/14/2018  . DKA (diabetic ketoacidoses) ([place]) 05/14/2018  . Acute respiratory failure with hypoxemia ([place]) 05/14/2018  . Pneumatosis of intestines   . Sepsis ([place])   . Endometrial polyp 11/29/2017  . Fibroid 11/29/2017  . Post-menopausal bleeding 11/14/2017  . Mild nonproliferative diabetic retinopathy of both eyes associated with type 2 diabetes mellitus ([place]) 06/09/2016  . Uncontrolled type 2 diabetes mellitus with nonproliferative retinopathy  ([place]) 03/01/2016  . Vitamin D deficiency 07/07/2015  . Vitamin B12 deficiency 07/07/2015  . Snoring 12/25/2014  . Allergic rhinitis, seasonal 12/14/2014  . Chronic constipation 12/14/2014  . Diabetes mellitus with renal manifestation ([place]) 12/14/2014  . Dyslipidemia 12/14/2014  . Dermatitis, eczematoid 12/14/2014  . Edema extremities 12/14/2014  . Essential (primary) hypertension 12/14/2014  . Gastro-esophageal reflux disease without esophagitis 12/14/2014  . Genital herpes in women 12/14/2014  . Extreme obesity 12/14/2014  . Female climacteric state 12/14/2014    Past Surgical History:  Procedure Laterality Date  . APPLICATION OF WOUND VAC  04/2018  . CESAREAN SECTION  1993  . LAPAROTOMY N/A 05/14/2018   Procedure: EXPLORATORY LAPAROTOMY;  Surgeon: Georganna Skeans, MD;  Location: [place];  Service: General;  Laterality: N/A;  . LAPAROTOMY N/A 05/16/2018   Procedure: EXPLORATORY LAPAROTOMY with reanatomosis and fascia closure with wound vacum application;  Surgeon: Erroll Luna, MD;  Location: [place];  Service: General;  Laterality: N/A;  . tonsillectomy and adnoidectomy    . WISDOM TOOTH EXTRACTION      Family History  Problem Relation Age of Onset  . Diabetes Mother   . Asthma Mother   . Heart disease Mother   . Heart disease Father   . Prostate cancer Father   . Other Father        blockages in stomach  . Kidney disease Paternal Grandmother   . Asthma Son   . Autism Son   . Breast cancer Neg Hx   . Colon cancer Neg Hx   .  Esophageal cancer Neg Hx   . Pancreatic cancer Neg Hx   . Rectal cancer Neg Hx   . Stomach cancer Neg Hx     Social History   Socioeconomic History  . Marital status: Married    Spouse name: Not on file  . Number of children: 1  . Years of education: Not on file  . Highest education level: 12th grade  Occupational History    Comment: unemployed   Social Needs  . Financial resource strain: Somewhat hard  . Food insecurity:    Worry:  Often true    Inability: Often true  . Transportation needs:    Medical: No    Non-medical: No  Tobacco Use  . Smoking status: Light Tobacco Smoker    Years: 20.00    Types: Cigarettes    Start date: 04/19/1971    Last attempt to quit: 04/19/1991    Years since quitting: 27.1  . Smokeless tobacco: Never Used  . Tobacco comment: she quitted and started back October 2019  Substance and Sexual Activity  . Alcohol use: No    Alcohol/week: 0.0 standard drinks  . Drug use: No  . Sexual activity: Yes    Partners: Male    Birth control/protection: Post-menopausal  Lifestyle  . Physical activity:    Days per week: 0 days    Minutes per session: 0 min  . Stress: Very much  Relationships  . Social connections:    Talks on phone: Not on file    Gets together: Not on file    Attends religious service: Not on file    Active member of club or organization: Not on file    Attends meetings of clubs or organizations: Not on file    Relationship status: Not on file  . Intimate partner violence:    Fear of current or ex partner: No    Emotionally abused: No    Physically abused: No    Forced sexual activity: No  Other Topics Concern  . Not on file  Social History Narrative   She lost her job Dec 2017 , worked for 36 years at the same company .    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  acetaminophen (TYLENOL) 325 MG tablet, Take 2 tablets (650 mg total) by mouth every 6 (six) hours as needed for mild pain or headache., Disp: 25 tablet, Rfl: 0 .  aspirin (ASPIRIN LOW DOSE) 81 MG chewable tablet, Chew 1 tablet by mouth daily., Disp: , Rfl:  .  atorvastatin (LIPITOR) 40 MG tablet, Take 1 tablet (40 mg total) by mouth every evening., Disp: 90 tablet, Rfl: 1 .  Blood Glucose Monitoring Suppl (ONE TOUCH ULTRA 2)  w/Device KIT, 1 kit by Subdermal route See admin instructions., Disp: 1 each, Rfl: 0 .  Cholecalciferol (VITAMIN D) 2000 units CAPS, Take 1 capsule (2,000 Units total) by mouth daily., Disp: 30 capsule, Rfl: 0 .  Empagliflozin-metFORMIN HCl ER (SYNJARDY XR) 25-1000 MG TB24, Take 1 tablet by mouth daily., Disp: 90 tablet, Rfl: 1 .  fluticasone (FLONASE) 50 MCG/ACT nasal spray, Place 2 sprays into both nostrils daily. (Patient taking differently: Place 2 sprays into both nostrils daily as needed for allergies. ), Disp: 16 g, Rfl: 0 .  glucose blood (  ONE TOUCH ULTRA TEST) test strip, CHECK FASTING BLOOD SUGARS TWICE DAILY, Disp: 100 each, Rfl: 2 .  insulin degludec (TRESIBA) 100 UNIT/ML SOPN FlexTouch Pen, INJECT 20 TO 50 UNITS INTO THE SKIN DAILY (Patient taking differently: Inject 20-50 Units into the skin daily. ), Disp: 15 mL, Rfl: 0 .  Insulin Pen Needle (NOVOFINE) 32G X 6 MM MISC, 1 each by Does not apply route daily., Disp: 100 each, Rfl: 2 .  ONETOUCH DELICA LANCETS FINE MISC, See admin instructions., Disp: , Rfl: 0 .  telmisartan-hydrochlorothiazide (MICARDIS HCT) 80-25 MG tablet, TAKE 1 TABLET BY MOUTH DAILY. *INS ONLY PAYS FOR 30 DAYS* (Patient taking differently: Take 1 tablet by mouth daily. ), Disp: 90 tablet, Rfl: 1 .  valACYclovir (VALTREX) 500 MG tablet, Take 1 tablet (500 mg total) by mouth 3 (three) times daily as needed. And daily for prevention (Patient taking differently: Take 500 mg by mouth 3 (three) times daily as needed (for prevention). ), Disp: 100 tablet, Rfl: 1 .  venlafaxine XR (EFFEXOR-XR) 150 MG 24 hr capsule, Take 1 capsule (150 mg total) by mouth daily., Disp: 90 capsule, Rfl: 1  Allergies  Allergen Reactions  . Penicillins Swelling    Did it involve swelling of the face/tongue/throat, SOB, or low BP? Yes Did it involve sudden or severe rash/hives, skin peeling, or any reaction on the inside of your mouth or nose? No Did you need to seek medical attention at a  hospital or doctor's office? Yes When did it last happen? unknown If all above answers are "NO", may proceed with cephalosporin use.     I personally reviewed active problem list, medication list, allergies, family history, social history with the patient/caregiver today.   ROS  Constitutional: Negative for fever , positive for  weight change.  Respiratory: Negative for cough, but has  shortness of breath with activity .   Cardiovascular: Negative for chest pain or palpitations.  Gastrointestinal: positive  For mild  abdominal pain, states normal bowel movements  Musculoskeletal: Negative for gait problem or joint swelling.  Skin: Negative for rash.  Neurological: Negative for dizziness or headache.  No other specific complaints in a complete review of systems (except as listed in HPI above).  Objective  Vitals:   06/21/18 1205  BP: 128/72  Pulse: 97  Resp: 20  Temp: 97.8 F (36.6 C)  TempSrc: Oral  SpO2: 98%  Weight: 241 lb (109.3 kg)  Height: _0  (1.6 m)    Body mass index is 42.69 kg/m.  Physical Exam  Constitutional: Patient appears well-developed and well-nourished. Obese  No distress.  HEENT: head atraumatic, normocephalic, pupils equal and reactive to light,  neck supple, throat within normal limits Cardiovascular: Normal rate, regular rhythm and normal heart sounds.  No murmur heard. No BLE edema. Pulmonary/Chest: Effort normal and breath sounds normal. No respiratory distress. Abdominal: Soft.  There is mild tenderness, decrease bowel sounds, drain in place H-vac  Psychiatric: Patient has a normal mood and affect. behavior is normal. Judgment and thought content normal.   PHQ2/9: Depression screen [facility] 2/9 06/21/2018 03/27/2018 02/06/2018 12/29/2017 11/06/2017  Decreased Interest 2 0 2 0 1  Down, Depressed, Hopeless 0 0 1 0 1  PHQ - 2 Score 2 0 3 0 2  Altered sleeping 0 _1 0  Tired, decreased energy _2 0 0  Change in appetite 0 2 0 0 0  Feeling bad  or failure about yourself  0 0 0 0 0  Trouble concentrating 0 0 0 0 0  Moving slowly or fidgety/restless 0 0 0 0 0  Suicidal thoughts 0 0 0 0 0  PHQ-9 Score _0 Difficult doing work/chores Not difficult at all Somewhat difficult Not difficult at all Somewhat difficult Somewhat difficult     Fall Risk: Fall Risk  03/27/2018 02/06/2018 12/29/2017 11/06/2017 05/25/2017  Falls in the past year? 1 No No No No  Number falls in past yr: 1 - - - -  Injury with Fall? 0 - - - -  Risk for fall due to : History of fall(s) - - - -  Follow up Falls prevention discussed - - - -     Functional Status Survey: Is the patient deaf or have difficulty hearing?: No Does the patient have difficulty seeing, even when wearing glasses/contacts?: No Does the patient have difficulty concentrating, remembering, or making decisions?: No Does the patient have difficulty walking or climbing stairs?: No Does the patient have difficulty dressing or bathing?: No Does the patient have difficulty doing errands alone such as visiting a doctor's office or shopping?: No    Assessment & Plan  1. Hospital discharge follow-up  We will recheck labs - COMPLETE METABOLIC PANEL WITH GFR - CBC with Differential/Platelet  2. Moderate protein-calorie malnutrition (HCC)  - COMPLETE METABOLIC PANEL WITH GFR - CBC with Differential/Platelet  3. History of small bowel obstruction  Doing well , has wound vac appetite improved , no nausea or vomiting   4. History of resection of small bowel  Reviewed surgical report with patient  5. Type 2 diabetes with nephropathy (HCC)  - COMPLETE METABOLIC PANEL WITH GFR - Hemoglobin A1c  6. Hypokalemia  - COMPLETE METABOLIC PANEL WITH GFR  7. Abnormal CT of the chest  - DG Chest 2 View; Future

## 2018-06-22 ENCOUNTER — Telehealth: Payer: Self-pay

## 2018-06-22 ENCOUNTER — Other Ambulatory Visit: Payer: Self-pay | Admitting: Family Medicine

## 2018-06-22 DIAGNOSIS — Z48815 Encounter for surgical aftercare following surgery on the digestive system: Secondary | ICD-10-CM | POA: Diagnosis not present

## 2018-06-22 DIAGNOSIS — I10 Essential (primary) hypertension: Secondary | ICD-10-CM | POA: Diagnosis not present

## 2018-06-22 DIAGNOSIS — E119 Type 2 diabetes mellitus without complications: Secondary | ICD-10-CM | POA: Diagnosis not present

## 2018-06-22 LAB — CBC WITH DIFFERENTIAL/PLATELET
Absolute Monocytes: 614 cells/uL (ref 200–950)
BASOS PCT: 0.4 %
Basophils Absolute: 33 cells/uL (ref 0–200)
EOS ABS: 158 {cells}/uL (ref 15–500)
Eosinophils Relative: 1.9 %
HCT: 34.1 % — ABNORMAL LOW (ref 35.0–45.0)
Hemoglobin: 11.1 g/dL — ABNORMAL LOW (ref 11.7–15.5)
Lymphs Abs: 2473 cells/uL (ref 850–3900)
MCH: 27.1 pg (ref 27.0–33.0)
MCHC: 32.6 g/dL (ref 32.0–36.0)
MCV: 83.2 fL (ref 80.0–100.0)
MPV: 10.6 fL (ref 7.5–12.5)
Monocytes Relative: 7.4 %
Neutro Abs: 5022 cells/uL (ref 1500–7800)
Neutrophils Relative %: 60.5 %
Platelets: 348 10*3/uL (ref 140–400)
RBC: 4.1 10*6/uL (ref 3.80–5.10)
RDW: 15.8 % — ABNORMAL HIGH (ref 11.0–15.0)
Total Lymphocyte: 29.8 %
WBC: 8.3 10*3/uL (ref 3.8–10.8)

## 2018-06-22 LAB — COMPLETE METABOLIC PANEL WITH GFR
AG Ratio: 1.5 (calc) (ref 1.0–2.5)
ALT: 15 U/L (ref 6–29)
AST: 17 U/L (ref 10–35)
Albumin: 3.7 g/dL (ref 3.6–5.1)
Alkaline phosphatase (APISO): 103 U/L (ref 37–153)
BUN: 11 mg/dL (ref 7–25)
CO2: 31 mmol/L (ref 20–32)
Calcium: 9.2 mg/dL (ref 8.6–10.4)
Chloride: 100 mmol/L (ref 98–110)
Creat: 0.84 mg/dL (ref 0.50–1.05)
GFR, EST AFRICAN AMERICAN: 89 mL/min/{1.73_m2} (ref >=60)
GFR, EST NON AFRICAN AMERICAN: 77 mL/min/{1.73_m2} (ref >=60)
Globulin: 2.4 g/dL (calc) (ref 1.9–3.7)
Glucose, Bld: 170 mg/dL — ABNORMAL HIGH (ref 65–99)
Potassium: 2.8 mmol/L — ABNORMAL LOW (ref 3.5–5.3)
Sodium: 144 mmol/L (ref 135–146)
Total Bilirubin: 0.3 mg/dL (ref 0.2–1.2)
Total Protein: 6.1 g/dL (ref 6.1–8.1)

## 2018-06-22 LAB — HEMOGLOBIN A1C
Hgb A1c MFr Bld: 7.6 % of total Hgb — ABNORMAL HIGH (ref <5.7)
Mean Plasma Glucose: 171 (calc)
eAG (mmol/L): 9.5 (calc)

## 2018-06-22 MED ORDER — POTASSIUM CHLORIDE CRYS ER 20 MEQ PO TBCR: 20.0000 meq | EXTENDED_RELEASE_TABLET | Freq: Two times a day (BID) | ORAL | 3 refills | Status: DC

## 2018-06-22 NOTE — Telephone Encounter (Signed)
Patient called.  Unable to reach patient. If patient calls back please inform her of her most recent lab results.  

## 2018-06-25 DIAGNOSIS — Z48815 Encounter for surgical aftercare following surgery on the digestive system: Secondary | ICD-10-CM | POA: Diagnosis not present

## 2018-06-25 DIAGNOSIS — E119 Type 2 diabetes mellitus without complications: Secondary | ICD-10-CM | POA: Diagnosis not present

## 2018-06-25 DIAGNOSIS — I10 Essential (primary) hypertension: Secondary | ICD-10-CM | POA: Diagnosis not present

## 2018-06-26 DIAGNOSIS — Z48815 Encounter for surgical aftercare following surgery on the digestive system: Secondary | ICD-10-CM | POA: Diagnosis not present

## 2018-06-26 DIAGNOSIS — I10 Essential (primary) hypertension: Secondary | ICD-10-CM | POA: Diagnosis not present

## 2018-06-26 DIAGNOSIS — E119 Type 2 diabetes mellitus without complications: Secondary | ICD-10-CM | POA: Diagnosis not present

## 2018-06-27 ENCOUNTER — Ambulatory Visit
Admission: RE | Admit: 2018-06-27 | Discharge: 2018-06-27 | Disposition: A | Discharge status: Home / Self Care | Payer: 59 | Source: Ambulatory Visit | Attending: Family Medicine | Admitting: Family Medicine

## 2018-06-27 ENCOUNTER — Ambulatory Visit (INDEPENDENT_AMBULATORY_CARE_PROVIDER_SITE_OTHER): Payer: 59 | Admitting: Family Medicine

## 2018-06-27 ENCOUNTER — Encounter: Payer: Self-pay | Admitting: Family Medicine

## 2018-06-27 ENCOUNTER — Other Ambulatory Visit: Payer: Self-pay

## 2018-06-27 ENCOUNTER — Ambulatory Visit
Admission: RE | Admit: 2018-06-27 | Discharge: 2018-06-27 | Disposition: A | Discharge status: Home / Self Care | Payer: 59 | Attending: Family Medicine | Admitting: Family Medicine

## 2018-06-27 VITALS — BP 124/80 | HR 105 | Temp 98.5°F | Resp 16 | Ht 63.0 in | Wt 241.3 lb

## 2018-06-27 DIAGNOSIS — R9389 Abnormal findings on diagnostic imaging of other specified body structures: Secondary | ICD-10-CM | POA: Diagnosis not present

## 2018-06-27 DIAGNOSIS — J9811 Atelectasis: Secondary | ICD-10-CM | POA: Diagnosis not present

## 2018-06-27 DIAGNOSIS — F411 Generalized anxiety disorder: Secondary | ICD-10-CM

## 2018-06-27 DIAGNOSIS — E133299 Other specified diabetes mellitus with mild nonproliferative diabetic retinopathy without macular edema, unspecified eye: Secondary | ICD-10-CM

## 2018-06-27 DIAGNOSIS — E785 Hyperlipidemia, unspecified: Secondary | ICD-10-CM | POA: Diagnosis not present

## 2018-06-27 DIAGNOSIS — E876 Hypokalemia: Secondary | ICD-10-CM | POA: Diagnosis not present

## 2018-06-27 DIAGNOSIS — N951 Menopausal and female climacteric states: Secondary | ICD-10-CM

## 2018-06-27 DIAGNOSIS — Z8719 Personal history of other diseases of the digestive system: Secondary | ICD-10-CM

## 2018-06-27 DIAGNOSIS — I10 Essential (primary) hypertension: Secondary | ICD-10-CM

## 2018-06-27 DIAGNOSIS — R918 Other nonspecific abnormal finding of lung field: Secondary | ICD-10-CM | POA: Insufficient documentation

## 2018-06-27 DIAGNOSIS — E1121 Type 2 diabetes mellitus with diabetic nephropathy: Secondary | ICD-10-CM

## 2018-06-27 MED ORDER — VENLAFAXINE HCL ER 75 MG PO CP24: 75.0000 mg | ORAL_CAPSULE | Freq: Every day | ORAL | 0 refills | Status: DC

## 2018-06-27 MED ORDER — TELMISARTAN-HCTZ 80-25 MG PO TABS: 1.0000 | ORAL_TABLET | Freq: Every day | ORAL | 1 refills | Status: DC

## 2018-06-27 MED ORDER — ATORVASTATIN CALCIUM 40 MG PO TABS: 40.0000 mg | ORAL_TABLET | Freq: Every evening | ORAL | 1 refills | Status: DC

## 2018-06-27 MED ORDER — EMPAGLIFLOZIN-METFORMIN HCL ER 25-1000 MG PO TB24: 1.0000 | ORAL_TABLET | Freq: Every day | ORAL | 1 refills | Status: DC

## 2018-06-27 NOTE — Progress Notes (Signed)
Name: Wendy [name]   MRN: [id number]    DOB: February 20, 1961   Date:06/27/2018       Progress Note  Subjective  Chief Complaint  Chief Complaint  Patient presents with  . Medication Refill  . Follow-up    HPI    DM with nephropathy, retinopathy: erhgbA1C went up from 7.1% to 7.7%, 7.5%., 8.1 % , 9.7 ,8.2% last week it was 7.6%   She has been taking Synajardi and denies side effects and back on Tresiba . Glucose has been around 150-160  Fasting since hospital discharge but she is slowly titrating back to 20 units daily, currently on 12 units . She lost weight during hospital stay, she has obesity and history of proteinuria. Advised to go back to ophthalmologist    WPY:KDXIP she has been compliant with Telmisartan. HCTZ and bp is at goal. No chest pain or palpitation. Unchanged   Hyperlipidemia: she has been off Atorvastatin since hospital stay, advised to resume it today, no chest pain or myalgia.   Snoring: she did not go to sleep study, and she did not re-schedule it. She always feels tired. Explained importance of sleep study. There is an increase risk of heart attacks and strokes in patient with sleep apnea that are not treated.She continues to refuseit. Unchanged   Extremity obesity:lost weight since went to [facility] and had partial small bowel resection. Discussed importance of continuing healthy diet to keep weight down .   Hot Flashes/Depression and GAD: she has been on Effexor, she states did not like to 150 mg and is back on 75 mg , phq 9 is stable, recently discharged from [facility], we will continue current dose for now.Long history of depression.    Reminded her today to take potassium pills, get CXR and return in a few days for repeat potassium level   Patient Active Problem List   Diagnosis Date Noted  . Endometrial polyp 11/29/2017  . Fibroid 11/29/2017  . Post-menopausal bleeding 11/14/2017  . Mild nonproliferative diabetic retinopathy of both eyes associated with type 2  diabetes mellitus ([place]) 06/09/2016  . Uncontrolled type 2 diabetes mellitus with nonproliferative retinopathy ([place]) 03/01/2016  . Vitamin D deficiency 07/07/2015  . Vitamin B12 deficiency 07/07/2015  . Snoring 12/25/2014  . Allergic rhinitis, seasonal 12/14/2014  . Chronic constipation 12/14/2014  . Diabetes mellitus with renal manifestation ([place]) 12/14/2014  . Dyslipidemia 12/14/2014  . Dermatitis, eczematoid 12/14/2014  . Edema extremities 12/14/2014  . Essential (primary) hypertension 12/14/2014  . Gastro-esophageal reflux disease without esophagitis 12/14/2014  . Genital herpes in women 12/14/2014  . Morbid obesity ([place]) 12/14/2014  . Female climacteric state 12/14/2014    Past Surgical History:  Procedure Laterality Date  . APPLICATION OF WOUND VAC  04/2018  . CESAREAN SECTION  1993  . LAPAROTOMY N/A 05/14/2018   Procedure: EXPLORATORY LAPAROTOMY;  Surgeon: Georganna Skeans, MD;  Location: [place];  Service: General;  Laterality: N/A;  . LAPAROTOMY N/A 05/16/2018   Procedure: EXPLORATORY LAPAROTOMY with reanatomosis and fascia closure with wound vacum application;  Surgeon: Erroll Luna, MD;  Location: [place];  Service: General;  Laterality: N/A;  . tonsillectomy and adnoidectomy    . WISDOM TOOTH EXTRACTION      Family History  Problem Relation Age of Onset  . Diabetes Mother   . Asthma Mother   . Heart disease Mother   . Heart disease Father   . Prostate cancer Father   . Other Father        blockages  in stomach  . Kidney disease Paternal Grandmother   . Asthma Son   . Autism Son   . Breast cancer Neg Hx   . Colon cancer Neg Hx   . Esophageal cancer Neg Hx   . Pancreatic cancer Neg Hx   . Rectal cancer Neg Hx   . Stomach cancer Neg Hx     Social History   Socioeconomic History  . Marital status: Married    Spouse name: Not on file  . Number of children: 1  . Years of education: Not on file  . Highest education level: 12th grade  Occupational History     Comment: unemployed   Social Needs  . Financial resource strain: Somewhat hard  . Food insecurity:    Worry: Often true    Inability: Often true  . Transportation needs:    Medical: No    Non-medical: No  Tobacco Use  . Smoking status: Former Smoker    Years: 20.00    Types: Cigarettes    Start date: 04/19/1971    Last attempt to quit: 04/2018    Years since quitting: 0.1  . Smokeless tobacco: Never Used  . Tobacco comment: she quitted and started back October 2019, but quit again 04/2018  Substance and Sexual Activity  . Alcohol use: No    Alcohol/week: 0.0 standard drinks  . Drug use: No  . Sexual activity: Yes    Partners: Male    Birth control/protection: Post-menopausal  Lifestyle  . Physical activity:    Days per week: 0 days    Minutes per session: 0 min  . Stress: Very much  Relationships  . Social connections:    Talks on phone: Not on file    Gets together: Not on file    Attends religious service: Not on file    Active member of club or organization: Not on file    Attends meetings of clubs or organizations: Not on file    Relationship status: Not on file  . Intimate partner violence:    Fear of current or ex partner: No    Emotionally abused: No    Physically abused: No    Forced sexual activity: No  Other Topics Concern  . Not on file  Social History Narrative   She lost her job Dec 2017 , worked for 36 years at the same company .    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  acetaminophen (TYLENOL) 325 MG tablet, Take 2 tablets (650 mg total) by mouth every 6 (six) hours as needed for mild pain or headache., Disp: 25 tablet, Rfl: 0 .  aspirin (ASPIRIN LOW DOSE) 81 MG chewable tablet, Chew 1 tablet by mouth daily., Disp: , Rfl:  .  atorvastatin (LIPITOR) 40 MG tablet,  Take 1 tablet (40 mg total) by mouth every evening., Disp: 90 tablet, Rfl: 1 .  Blood Glucose Monitoring Suppl (ONE TOUCH ULTRA 2) w/Device KIT, 1 kit by Subdermal route See admin instructions., Disp: 1 each, Rfl: 0 .  Cholecalciferol (VITAMIN D) 2000 units CAPS, Take 1 capsule (2,000 Units total) by mouth daily., Disp: 30 capsule, Rfl: 0 .  Empagliflozin-metFORMIN HCl ER (SYNJARDY XR) 25-1000 MG TB24, Take 1 tablet by mouth daily., Disp: 90 tablet, Rfl: 1 .  fluticasone (FLONASE) 50 MCG/ACT nasal spray, Place 2 sprays into both nostrils daily. (Patient taking differently: Place 2 sprays into both nostrils daily as needed for allergies. ), Disp: 16 g, Rfl: 0 .  glucose blood (ONE TOUCH ULTRA TEST) test strip, CHECK FASTING BLOOD SUGARS TWICE DAILY, Disp: 100 each, Rfl: 2 .  insulin degludec (TRESIBA) 100 UNIT/ML SOPN FlexTouch Pen, INJECT 20 TO 50 UNITS INTO THE SKIN DAILY (Patient taking differently: Inject 20-50 Units into the skin daily. ), Disp: 15 mL, Rfl: 0 .  Insulin Pen Needle (NOVOFINE) 32G X 6 MM MISC, 1 each by Does not apply route daily., Disp: 100 each, Rfl: 2 .  ONETOUCH DELICA LANCETS FINE MISC, See admin instructions., Disp: , Rfl: 0 .  potassium chloride SA (K-DUR,KLOR-CON) 20 MEQ tablet, Take 1 tablet (20 mEq total) by mouth 2 (two) times daily., Disp: 20 tablet, Rfl: 3 .  telmisartan-hydrochlorothiazide (MICARDIS HCT) 80-25 MG tablet, Take 1 tablet by mouth daily., Disp: 90 tablet, Rfl: 1 .  valACYclovir (VALTREX) 500 MG tablet, Take 1 tablet (500 mg total) by mouth 3 (three) times daily as needed. And daily for prevention (Patient taking differently: Take 500 mg by mouth 3 (three) times daily as needed (for prevention). ), Disp: 100 tablet, Rfl: 1 .  venlafaxine XR (EFFEXOR-XR) 75 MG 24 hr capsule, Take 1 capsule (75 mg total) by mouth daily., Disp: 90 capsule, Rfl: 0  Allergies  Allergen Reactions  . Penicillins Swelling    Did it involve swelling of the face/tongue/throat, SOB,  or low BP? Yes Did it involve sudden or severe rash/hives, skin peeling, or any reaction on the inside of your mouth or nose? No Did you need to seek medical attention at a hospital or doctor's office? Yes When did it last happen? unknown If all above answers are "NO", may proceed with cephalosporin use.     I personally reviewed active problem list, medication list, allergies, family history, social history with the patient/caregiver today.   ROS  Constitutional: Negative for fever or weight change - since last visit .  Respiratory: Negative for cough and shortness of breath.   Cardiovascular: Negative for chest pain or palpitations.  Gastrointestinal: expected abdominal soreness from recent surgery ,  no bowel changes. Appetite is improving  Musculoskeletal: Negative for gait problem or joint swelling.  Skin: Negative for rash.  Neurological: Negative for dizziness or headache.  No other specific complaints in a complete review of systems (except as listed in HPI above).  Objective  Vitals:   06/27/18 1300  BP: 124/80  Pulse: (!) 105  Resp: 16  Temp: 98.5 F (36.9 C)  TempSrc: Oral  SpO2: 96%  Weight: 241 lb 4.8 oz (109.5 kg)  Height: _0  (1.6 m)    Body mass index is 42.74 kg/m.  Physical Exam  Constitutional: Patient appears well-developed and well-nourished. Obese No distress.  HEENT: head atraumatic, normocephalic, pupils equal and reactive to light,  neck supple, throat within normal limits Cardiovascular: Normal rate, regular rhythm and normal heart sounds.  No murmur heard. No BLE edema. Pulmonary/Chest: Effort normal and breath sounds normal. No respiratory distress. Abdominal: Soft.  There is no tenderness. Skin: hands are pilling, had antibiotics during hospital stay, started after discharge, no other rashes  Psychiatric: Patient has a normal mood and affect. behavior is normal. Judgment and thought content normal.  PHQ2/9: Depression screen [facility] 2/9  06/27/2018 06/21/2018 03/27/2018 02/06/2018 12/29/2017  Decreased Interest 3 2 0 2 0  Down,  Depressed, Hopeless 0 0 0 1 0  PHQ - 2 Score 3 2 0 3 0  Altered sleeping 3 0 _0 Tired, decreased energy 0 _1 0  Change in appetite 0 0 2 0 0  Feeling bad or failure about yourself  0 0 0 0 0  Trouble concentrating 0 0 0 0 0  Moving slowly or fidgety/restless 0 0 0 0 0  Suicidal thoughts 0 0 0 0 0  PHQ-9 Score _2 Difficult doing work/chores Not difficult at all Not difficult at all Somewhat difficult Not difficult at all Somewhat difficult   Phq 9 positive, but stable, some of symptoms worse since hospital stay   Fall Risk: Fall Risk  06/27/2018 03/27/2018 02/06/2018 12/29/2017 11/06/2017  Falls in the past year? 0 1 No No No  Number falls in past yr: 0 1 - - -  Injury with Fall? 0 0 - - -  Risk for fall due to : - History of fall(s) - - -  Follow up - Falls prevention discussed - - -     Assessment & Plan  1. Type 2 diabetes with nephropathy (HCC)  On ARB  2. Hypokalemia  She did not start potassium sent last week, we will recheck labs at the end of the week  - Potassium - Magnesium  3. Dyslipidemia  - atorvastatin (LIPITOR) 40 MG tablet; Take 1 tablet (40 mg total) by mouth every evening.  Dispense: 90 tablet; Refill: 1  4. Nonproliferative retinopathy due to secondary diabetes mellitus (HCC)  - Empagliflozin-metFORMIN HCl ER (SYNJARDY XR) 25-1000 MG TB24; Take 1 tablet by mouth daily.  Dispense: 90 tablet; Refill: 1  5. Essential (primary) hypertension  - telmisartan-hydrochlorothiazide (MICARDIS HCT) 80-25 MG tablet; Take 1 tablet by mouth daily.  Dispense: 90 tablet; Refill: 1  6. Female climacteric state  - venlafaxine XR (EFFEXOR-XR) 75 MG 24 hr capsule; Take 1 capsule (75 mg total) by mouth daily.  Dispense: 90 capsule; Refill: 0  7. GAD (generalized anxiety disorder)  - venlafaxine XR (EFFEXOR-XR) 75 MG 24 hr capsule; Take 1 capsule (75 mg total) by mouth  daily.  Dispense: 90 capsule; Refill: 0  8. Morbid obesity ([place])  Discussed with the patient the risk posed by an increased BMI. Discussed importance of portion control, calorie counting and at least 150 minutes of physical activity weekly. Avoid sweet beverages and drink more water. Eat at least 6 servings of fruit and vegetables daily   9. History of small bowel obstruction  Doing better, drain is out

## 2018-06-29 DIAGNOSIS — E119 Type 2 diabetes mellitus without complications: Secondary | ICD-10-CM | POA: Diagnosis not present

## 2018-06-29 DIAGNOSIS — Z48815 Encounter for surgical aftercare following surgery on the digestive system: Secondary | ICD-10-CM | POA: Diagnosis not present

## 2018-06-29 DIAGNOSIS — I10 Essential (primary) hypertension: Secondary | ICD-10-CM | POA: Diagnosis not present

## 2018-07-02 DIAGNOSIS — Z48815 Encounter for surgical aftercare following surgery on the digestive system: Secondary | ICD-10-CM | POA: Diagnosis not present

## 2018-07-02 DIAGNOSIS — I10 Essential (primary) hypertension: Secondary | ICD-10-CM | POA: Diagnosis not present

## 2018-07-02 DIAGNOSIS — E119 Type 2 diabetes mellitus without complications: Secondary | ICD-10-CM | POA: Diagnosis not present

## 2018-07-04 DIAGNOSIS — I10 Essential (primary) hypertension: Secondary | ICD-10-CM | POA: Diagnosis not present

## 2018-07-04 DIAGNOSIS — E119 Type 2 diabetes mellitus without complications: Secondary | ICD-10-CM | POA: Diagnosis not present

## 2018-07-04 DIAGNOSIS — Z48815 Encounter for surgical aftercare following surgery on the digestive system: Secondary | ICD-10-CM | POA: Diagnosis not present

## 2018-07-10 DIAGNOSIS — E119 Type 2 diabetes mellitus without complications: Secondary | ICD-10-CM | POA: Diagnosis not present

## 2018-07-10 DIAGNOSIS — Z48815 Encounter for surgical aftercare following surgery on the digestive system: Secondary | ICD-10-CM | POA: Diagnosis not present

## 2018-07-10 DIAGNOSIS — I10 Essential (primary) hypertension: Secondary | ICD-10-CM | POA: Diagnosis not present

## 2018-07-17 ENCOUNTER — Inpatient Hospital Stay: Payer: 59 | Admitting: Family Medicine

## 2018-08-30 ENCOUNTER — Other Ambulatory Visit: Payer: Self-pay | Admitting: Family Medicine

## 2018-08-30 DIAGNOSIS — F411 Generalized anxiety disorder: Secondary | ICD-10-CM

## 2018-08-30 DIAGNOSIS — N951 Menopausal and female climacteric states: Secondary | ICD-10-CM

## 2018-09-09 ENCOUNTER — Other Ambulatory Visit: Payer: Self-pay | Admitting: Family Medicine

## 2018-09-09 DIAGNOSIS — E133299 Other specified diabetes mellitus with mild nonproliferative diabetic retinopathy without macular edema, unspecified eye: Secondary | ICD-10-CM

## 2018-09-23 ENCOUNTER — Other Ambulatory Visit: Payer: Self-pay | Admitting: Family Medicine

## 2018-09-23 DIAGNOSIS — N951 Menopausal and female climacteric states: Secondary | ICD-10-CM

## 2018-09-23 DIAGNOSIS — F411 Generalized anxiety disorder: Secondary | ICD-10-CM

## 2018-10-15 ENCOUNTER — Encounter: Payer: Self-pay | Admitting: Family Medicine

## 2018-10-15 ENCOUNTER — Ambulatory Visit (INDEPENDENT_AMBULATORY_CARE_PROVIDER_SITE_OTHER): Payer: 59 | Admitting: Family Medicine

## 2018-10-15 ENCOUNTER — Other Ambulatory Visit: Payer: Self-pay

## 2018-10-15 VITALS — BP 134/78 | HR 105 | Temp 96.8°F | Resp 16 | Ht 63.0 in | Wt 251.7 lb

## 2018-10-15 DIAGNOSIS — I10 Essential (primary) hypertension: Secondary | ICD-10-CM | POA: Diagnosis not present

## 2018-10-15 DIAGNOSIS — E876 Hypokalemia: Secondary | ICD-10-CM

## 2018-10-15 DIAGNOSIS — E1121 Type 2 diabetes mellitus with diabetic nephropathy: Secondary | ICD-10-CM | POA: Diagnosis not present

## 2018-10-15 DIAGNOSIS — E538 Deficiency of other specified B group vitamins: Secondary | ICD-10-CM

## 2018-10-15 DIAGNOSIS — E785 Hyperlipidemia, unspecified: Secondary | ICD-10-CM | POA: Diagnosis not present

## 2018-10-15 DIAGNOSIS — F411 Generalized anxiety disorder: Secondary | ICD-10-CM

## 2018-10-15 DIAGNOSIS — E559 Vitamin D deficiency, unspecified: Secondary | ICD-10-CM

## 2018-10-15 DIAGNOSIS — N951 Menopausal and female climacteric states: Secondary | ICD-10-CM

## 2018-10-15 LAB — POCT GLYCOSYLATED HEMOGLOBIN (HGB A1C): HbA1c, POC (controlled diabetic range): 7.3 % — AB (ref 0.0–7.0)

## 2018-10-15 MED ORDER — VENLAFAXINE HCL ER 75 MG PO CP24: ORAL_CAPSULE | ORAL | 0 refills | Status: DC

## 2018-10-15 NOTE — Progress Notes (Signed)
Name: Wendy [name]   MRN: [id number]    DOB: 05-Jan-1961   Date:10/15/2018       Progress Note  Subjective  Chief Complaint  Chief Complaint  Patient presents with  . Medication Refill  . Diabetes    Checks BS daily Average-130 Lowest-122 Highest-200 pre-prandial  . Hypertension    Edema in ankles  . Hyperlipidemia  . Dyspepsia  . Hot Flashes    States she will have excessive sweating just doing the simpliest task  . Depression  . GAD    HPI  DM with nephropathy, retinopathy:erhgbA1C went up from 7.1% to 7.7%, 7.5%., 8.1 % ,9.7 ,8.2% , today it is 7.3%  7.6%  She has been taking Synajardi and denies side effects and back on Tresiba . Glucose has been around 130, low of 122 and high of 200 . She has been avoiding bread and icing, still eats pound cake twice month. She has noticed polyphagia and polyuria, but no polydipsia.   BWL:SLHTD shehas been compliant withTelmisartan. HCTZ and bp is at goal, however she ran out of medication two days ago, explained she has one refill at the pharmacy. She denies chest pain or palpitation   Hyperlipidemia: she is back on Atorvastatin and we will recheck labs today  Snoring: she did not go to sleep study, and she did not re-schedule it. She always feels tired. Explained importance of sleep study. She has also notices that she has to take a deep breath when sitting and watching TV  Extremity obesity:lost weight since went to [facility] and had partial small bowel resection, however she has been gaining weight since. Explained importance of weight loss   Hot Flashes/Depression and GAD: she has been on Effexor, she states did not like to 150 mg and is back on 75 mg and has been taking 2 daily and has been doing well, phq 9 is stable, has some lack of motivation and poor sleep hygiene - discussed importance of staying up during the day   Patient Active Problem List   Diagnosis Date Noted  . Endometrial polyp 11/29/2017  . Fibroid 11/29/2017   . Post-menopausal bleeding 11/14/2017  . Mild nonproliferative diabetic retinopathy of both eyes associated with type 2 diabetes mellitus ([place]) 06/09/2016  . Uncontrolled type 2 diabetes mellitus with nonproliferative retinopathy ([place]) 03/01/2016  . Vitamin D deficiency 07/07/2015  . Vitamin B12 deficiency 07/07/2015  . Snoring 12/25/2014  . Allergic rhinitis, seasonal 12/14/2014  . Chronic constipation 12/14/2014  . Diabetes mellitus with renal manifestation ([place]) 12/14/2014  . Dyslipidemia 12/14/2014  . Dermatitis, eczematoid 12/14/2014  . Edema extremities 12/14/2014  . Essential (primary) hypertension 12/14/2014  . Gastro-esophageal reflux disease without esophagitis 12/14/2014  . Genital herpes in women 12/14/2014  . Morbid obesity ([place]) 12/14/2014  . Female climacteric state 12/14/2014    Past Surgical History:  Procedure Laterality Date  . APPLICATION OF WOUND VAC  04/2018  . CESAREAN SECTION  1993  . LAPAROTOMY N/A 05/14/2018   Procedure: EXPLORATORY LAPAROTOMY;  Surgeon: Georganna Skeans, MD;  Location: [place];  Service: General;  Laterality: N/A;  . LAPAROTOMY N/A 05/16/2018   Procedure: EXPLORATORY LAPAROTOMY with reanatomosis and fascia closure with wound vacum application;  Surgeon: Erroll Luna, MD;  Location: [facility];  Service: General;  Laterality: N/A;  . tonsillectomy and adnoidectomy    . WISDOM TOOTH EXTRACTION      Family History  Problem Relation Age of Onset  . Diabetes Mother   . Asthma Mother   .  Heart disease Mother   . Heart disease Father   . Prostate cancer Father   . Other Father        blockages in stomach  . Kidney disease Paternal Grandmother   . Asthma Son   . Autism Son   . Breast cancer Neg Hx   . Colon cancer Neg Hx   . Esophageal cancer Neg Hx   . Pancreatic cancer Neg Hx   . Rectal cancer Neg Hx   . Stomach cancer Neg Hx     Social History   Socioeconomic History  . Marital status: Married    Spouse name: Not on file  .  Number of children: 1  . Years of education: Not on file  . Highest education level: 12th grade  Occupational History    Comment: unemployed   Social Needs  . Financial resource strain: Somewhat hard  . Food insecurity    Worry: Often true    Inability: Often true  . Transportation needs    Medical: No    Non-medical: No  Tobacco Use  . Smoking status: Former Smoker    Years: 20.00    Types: Cigarettes    Start date: 04/19/1971    Quit date: 04/2018    Years since quitting: 0.4  . Smokeless tobacco: Never Used  . Tobacco comment: she quitted and started back October 2019, but quit again 04/2018  Substance and Sexual Activity  . Alcohol use: No    Alcohol/week: 0.0 standard drinks  . Drug use: No  . Sexual activity: Yes    Partners: Male    Birth control/protection: Post-menopausal  Lifestyle  . Physical activity    Days per week: 0 days    Minutes per session: 0 min  . Stress: Very much  Relationships  . Social Herbalist on phone: Not on file    Gets together: Not on file    Attends religious service: Not on file    Active member of club or organization: Not on file    Attends meetings of clubs or organizations: Not on file    Relationship status: Not on file  . Intimate partner violence    Fear of current or ex partner: No    Emotionally abused: No    Physically abused: No    Forced sexual activity: No  Other Topics Concern  . Not on file  Social History Narrative   She lost her job Dec 2017 , worked for 36 years at the same company .    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  acetaminophen (TYLENOL) 325 MG tablet, Take 2 tablets (650 mg total) by mouth every 6 (six) hours as needed for mild pain or headache., Disp: 25 tablet, Rfl: 0 .  aspirin (ASPIRIN LOW DOSE) 81  MG chewable tablet, Chew 1 tablet by mouth daily., Disp: , Rfl:  .  atorvastatin (LIPITOR) 40 MG tablet, Take 1 tablet (40 mg total) by mouth every evening., Disp: 90 tablet, Rfl: 1 .  Blood Glucose Monitoring Suppl (ONE TOUCH ULTRA 2) w/Device KIT, 1 kit by Subdermal route See admin instructions., Disp: 1 each, Rfl: 0 .  Cholecalciferol (VITAMIN D) 2000 units CAPS, Take 1 capsule (2,000 Units total) by mouth daily., Disp:  30 capsule, Rfl: 0 .  Empagliflozin-metFORMIN HCl ER (SYNJARDY XR) 25-1000 MG TB24, Take 1 tablet by mouth daily., Disp: 90 tablet, Rfl: 1 .  fluticasone (FLONASE) 50 MCG/ACT nasal spray, Place 2 sprays into both nostrils daily. (Patient taking differently: Place 2 sprays into both nostrils daily as needed for allergies. ), Disp: 16 g, Rfl: 0 .  glucose blood (ONE TOUCH ULTRA TEST) test strip, CHECK FASTING BLOOD SUGARS TWICE DAILY, Disp: 100 each, Rfl: 2 .  insulin degludec (TRESIBA FLEXTOUCH) 100 UNIT/ML SOPN FlexTouch Pen, Inject 0.2-0.5 mLs (20-50 Units total) into the skin daily., Disp: 15 mL, Rfl: 0 .  Insulin Pen Needle (NOVOFINE) 32G X 6 MM MISC, 1 each by Does not apply route daily., Disp: 100 each, Rfl: 2 .  ONETOUCH DELICA LANCETS FINE MISC, See admin instructions., Disp: , Rfl: 0 .  telmisartan-hydrochlorothiazide (MICARDIS HCT) 80-25 MG tablet, Take 1 tablet by mouth daily., Disp: 90 tablet, Rfl: 1 .  valACYclovir (VALTREX) 500 MG tablet, Take 1 tablet (500 mg total) by mouth 3 (three) times daily as needed. And daily for prevention (Patient taking differently: Take 500 mg by mouth 3 (three) times daily as needed (for prevention). ), Disp: 100 tablet, Rfl: 1 .  venlafaxine XR (EFFEXOR-XR) 75 MG 24 hr capsule, TAKE 2 CAPSULES (150 MG TOTAL) BY MOUTH DAILY., Disp: 180 capsule, Rfl: 0  Allergies  Allergen Reactions  . Penicillins Swelling    Did it involve swelling of the face/tongue/throat, SOB, or low BP? Yes Did it involve sudden or severe rash/hives, skin peeling,  or any reaction on the inside of your mouth or nose? No Did you need to seek medical attention at a hospital or doctor's office? Yes When did it last happen? unknown If all above answers are "NO", may proceed with cephalosporin use.     I personally reviewed active problem list, medication list, allergies, family history, social history with the patient/caregiver today.   ROS  Constitutional: Negative for fever or weight change.  Respiratory: Negative for cough , positive for shortness of breath -states when watching TV has to gasp for air, advised to have sleep study    Cardiovascular: Negative for chest pain or palpitations.  Gastrointestinal: Negative for abdominal pain, no bowel changes.  Musculoskeletal: Negative for gait problem or joint swelling.  Skin: Negative for rash.  Neurological: Negative for dizziness or headache.  No other specific complaints in a complete review of systems (except as listed in HPI above).  Objective  Vitals:   10/15/18 1452  BP: 134/78  Pulse: (!) 105  Resp: 16  Temp: (!) 96.8 F (36 C)  TempSrc: Temporal  SpO2: 97%  Weight: 251 lb 11.2 oz (114.2 kg)  Height: 5' 3"  (1.6 m)    Body mass index is 44.59 kg/m.  Physical Exam  Constitutional: Patient appears well-developed and well-nourished. Obese No distress.  HEENT: head atraumatic, normocephalic, pupils equal and reactive to light,neck supple, normal oral mucosa Cardiovascular: Normal rate, regular rhythm and normal heart sounds.  No murmur heard. No BLE edema. Pulmonary/Chest: Effort normal and breath sounds normal. No respiratory distress. Abdominal: Soft.  There is no tenderness. Psychiatric: Patient has a normal mood and affect. behavior is normal. Judgment and thought content normal.  Recent Results (from the past 2160 hour(s))  POCT HgB A1C     Status: Abnormal   Collection Time: 10/15/18  2:58 PM  Result Value Ref Range   Hemoglobin A1C     HbA1c POC (<> result,  manual  entry)     HbA1c, POC (prediabetic range)     HbA1c, POC (controlled diabetic range) 7.3 (A) 0.0 - 7.0 %      PHQ2/9: Depression screen [facility] 2/9 10/15/2018 06/27/2018 06/21/2018 03/27/2018 02/06/2018  Decreased Interest 2 3 2  0 2  Down, Depressed, Hopeless 0 0 0 0 1  PHQ - 2 Score 2 3 2  0 3  Altered sleeping 3 3 0 3 3  Tired, decreased energy 0 0 1 2 3   Change in appetite 0 0 0 2 0  Feeling bad or failure about yourself  0 0 0 0 0  Trouble concentrating 0 0 0 0 0  Moving slowly or fidgety/restless 0 0 0 0 0  Suicidal thoughts 0 0 0 0 0  PHQ-9 Score 5 6 3 7 9   Difficult doing work/chores Somewhat difficult Not difficult at all Not difficult at all Somewhat difficult Not difficult at all  Some recent data might be hidden    phq 9 is positive , taking medication, she sates never slept well at night    Fall Risk: Fall Risk  10/15/2018 06/27/2018 03/27/2018 02/06/2018 12/29/2017  Falls in the past year? 0 0 1 No No  Number falls in past yr: 0 0 1 - -  Injury with Fall? 0 0 0 - -  Risk for fall due to : - - History of fall(s) - -  Follow up - - Falls prevention discussed - -     Functional Status Survey: Is the patient deaf or have difficulty hearing?: No Does the patient have difficulty seeing, even when wearing glasses/contacts?: No Does the patient have difficulty concentrating, remembering, or making decisions?: No Does the patient have difficulty walking or climbing stairs?: No Does the patient have difficulty dressing or bathing?: No Does the patient have difficulty doing errands alone such as visiting a doctor's office or shopping?: No    Assessment & Plan  1. Type 2 diabetes with nephropathy (HCC)  - POCT HgB A1C  2. Hypokalemia  - Magnesium - COMPLETE METABOLIC PANEL WITH GFR  3. Dyslipidemia  - Lipid panel  4. Essential (primary) hypertension  - CBC with Differential/Platelet - COMPLETE METABOLIC PANEL WITH GFR  5. Female climacteric state  -  venlafaxine XR (EFFEXOR-XR) 75 MG 24 hr capsule; TAKE 2 CAPSULES (150 MG TOTAL) BY MOUTH DAILY.  Dispense: 180 capsule; Refill: 0  6. GAD (generalized anxiety disorder)  - venlafaxine XR (EFFEXOR-XR) 75 MG 24 hr capsule; TAKE 2 CAPSULES (150 MG TOTAL) BY MOUTH DAILY.  Dispense: 180 capsule; Refill: 0  7. Morbid obesity ([place])  Discussed with the patient the risk posed by an increased BMI. Discussed importance of portion control, calorie counting and at least 150 minutes of physical activity weekly. Avoid sweet beverages and drink more water. Eat at least 6 servings of fruit and vegetables daily   8. Vitamin D deficiency  Continue vitamin D supplementation   9. Vitamin B12 deficiency  Continue B12 supplementation

## 2018-10-16 LAB — COMPLETE METABOLIC PANEL WITH GFR
AG Ratio: 1.9 (calc) (ref 1.0–2.5)
ALT: 12 U/L (ref 6–29)
AST: 15 U/L (ref 10–35)
Albumin: 4.1 g/dL (ref 3.6–5.1)
Alkaline phosphatase (APISO): 117 U/L (ref 37–153)
BUN: 15 mg/dL (ref 7–25)
CO2: 29 mmol/L (ref 20–32)
Calcium: 9.5 mg/dL (ref 8.6–10.4)
Chloride: 109 mmol/L (ref 98–110)
Creat: 0.7 mg/dL (ref 0.50–1.05)
GFR, Est African American: 111 mL/min/{1.73_m2} (ref >=60)
GFR, Est Non African American: 96 mL/min/{1.73_m2} (ref >=60)
Globulin: 2.2 g/dL (calc) (ref 1.9–3.7)
Glucose, Bld: 139 mg/dL — ABNORMAL HIGH (ref 65–99)
Potassium: 3.4 mmol/L — ABNORMAL LOW (ref 3.5–5.3)
Sodium: 142 mmol/L (ref 135–146)
Total Bilirubin: 0.4 mg/dL (ref 0.2–1.2)
Total Protein: 6.3 g/dL (ref 6.1–8.1)

## 2018-10-16 LAB — CBC WITH DIFFERENTIAL/PLATELET
Absolute Monocytes: 542 cells/uL (ref 200–950)
Basophils Absolute: 43 cells/uL (ref 0–200)
Basophils Relative: 0.5 %
Eosinophils Absolute: 301 cells/uL (ref 15–500)
Eosinophils Relative: 3.5 %
HCT: 39.4 % (ref 35.0–45.0)
Hemoglobin: 12.4 g/dL (ref 11.7–15.5)
Lymphs Abs: 2735 cells/uL (ref 850–3900)
MCH: 25.6 pg — ABNORMAL LOW (ref 27.0–33.0)
MCHC: 31.5 g/dL — ABNORMAL LOW (ref 32.0–36.0)
MCV: 81.2 fL (ref 80.0–100.0)
MPV: 11 fL (ref 7.5–12.5)
Monocytes Relative: 6.3 %
Neutro Abs: 4979 cells/uL (ref 1500–7800)
Neutrophils Relative %: 57.9 %
Platelets: 356 10*3/uL (ref 140–400)
RBC: 4.85 10*6/uL (ref 3.80–5.10)
RDW: 15 % (ref 11.0–15.0)
Total Lymphocyte: 31.8 %
WBC: 8.6 10*3/uL (ref 3.8–10.8)

## 2018-10-16 LAB — LIPID PANEL
Cholesterol: 154 mg/dL (ref <200)
HDL: 58 mg/dL (ref >=50)
LDL Cholesterol (Calc): 80 mg/dL (calc)
Non-HDL Cholesterol (Calc): 96 mg/dL (calc) (ref <130)
Total CHOL/HDL Ratio: 2.7 (calc) (ref <5.0)
Triglycerides: 82 mg/dL (ref <150)

## 2018-10-16 LAB — MAGNESIUM: Magnesium: 2 mg/dL (ref 1.5–2.5)

## 2018-12-17 ENCOUNTER — Other Ambulatory Visit: Payer: Self-pay | Admitting: Family Medicine

## 2018-12-17 DIAGNOSIS — F411 Generalized anxiety disorder: Secondary | ICD-10-CM

## 2018-12-17 DIAGNOSIS — N951 Menopausal and female climacteric states: Secondary | ICD-10-CM

## 2019-01-09 ENCOUNTER — Other Ambulatory Visit: Payer: Self-pay | Admitting: Family Medicine

## 2019-01-09 DIAGNOSIS — I10 Essential (primary) hypertension: Secondary | ICD-10-CM

## 2019-01-09 DIAGNOSIS — F411 Generalized anxiety disorder: Secondary | ICD-10-CM

## 2019-01-09 DIAGNOSIS — N951 Menopausal and female climacteric states: Secondary | ICD-10-CM

## 2019-01-09 NOTE — Telephone Encounter (Signed)
Requested medication (s) are due for refill today: yes  Requested medication (s) are on the active medication list: yes  Last refill:  12/17/2018  Future visit scheduled: yes  Notes to clinic:  Requesting 90 day supply   Requested Prescriptions  Pending Prescriptions Disp Refills   venlafaxine XR (EFFEXOR-XR) 75 MG 24 hr capsule [Pharmacy Med Name: VENLAFAXINE HCL ER 75 MG CAP] 180 capsule 1    Sig: TAKE 2 CAPSULES (150 MG TOTAL) BY MOUTH DAILY.     Psychiatry: Antidepressants - SNRI - desvenlafaxine & venlafaxine Failed - 01/09/2019 11:06 AM      Failed - Completed PHQ-2 or PHQ-9 in the last 360 days.      Passed - LDL in normal range and within 360 days    LDL Cholesterol (Calc)  Date Value Ref Range Status  10/15/2018 80 mg/dL (calc) Final    Comment:    Reference range: <100 . Desirable range <100 mg/dL for primary prevention;   <70 mg/dL for patients with CHD or diabetic patients  with > or = 2 CHD risk factors. Marland Kitchen LDL-C is now calculated using the Martin-Hopkins  calculation, which is a validated novel method providing  better accuracy than the Friedewald equation in the  estimation of LDL-C.  Cresenciano Genre et al. Annamaria Helling. [id number]): 2061-2068  (http://education.[facility].com/faq/FAQ164)          Passed - Total Cholesterol in normal range and within 360 days    Cholesterol, Total  Date Value Ref Range Status  12/25/2014 140 100 - 199 mg/dL Final   Cholesterol  Date Value Ref Range Status  10/15/2018 154 <200 mg/dL Final         Passed - Triglycerides in normal range and within 360 days    Triglycerides  Date Value Ref Range Status  10/15/2018 82 <150 mg/dL Final         Passed - Last BP in normal range    BP Readings from Last 1 Encounters:  10/15/18 134/78         Passed - Valid encounter within last 6 months    Recent Outpatient Visits          2 months ago Type 2 diabetes with nephropathy [facility])   [facility] Steele Sizer, MD   6 months ago Nonproliferative retinopathy due to secondary diabetes mellitus [facility])   [facility] Steele Sizer, MD   6 months ago Hospital discharge follow-up   [facility] Steele Sizer, MD   9 months ago Well woman exam   [facility] Steele Sizer, MD   11 months ago Type 2 diabetes with nephropathy [facility])   [facility] Steele Sizer, MD      Future Appointments            In 6 days Steele Sizer, MD [facility], [facility]

## 2019-01-11 NOTE — Telephone Encounter (Signed)
Not sure why my note did not attach. She has one more refill before she is out.

## 2019-01-15 ENCOUNTER — Other Ambulatory Visit (HOSPITAL_COMMUNITY)
Admission: RE | Admit: 2019-01-15 | Discharge: 2019-01-15 | Disposition: A | Discharge status: Home / Self Care | Payer: 59 | Source: Ambulatory Visit | Attending: Family Medicine | Admitting: Family Medicine

## 2019-01-15 ENCOUNTER — Other Ambulatory Visit: Payer: Self-pay

## 2019-01-15 ENCOUNTER — Encounter: Payer: Self-pay | Admitting: Family Medicine

## 2019-01-15 ENCOUNTER — Ambulatory Visit (INDEPENDENT_AMBULATORY_CARE_PROVIDER_SITE_OTHER): Payer: 59 | Admitting: Family Medicine

## 2019-01-15 DIAGNOSIS — I10 Essential (primary) hypertension: Secondary | ICD-10-CM

## 2019-01-15 DIAGNOSIS — A6004 Herpesviral vulvovaginitis: Secondary | ICD-10-CM | POA: Diagnosis not present

## 2019-01-15 DIAGNOSIS — Z1239 Encounter for other screening for malignant neoplasm of breast: Secondary | ICD-10-CM

## 2019-01-15 DIAGNOSIS — Z124 Encounter for screening for malignant neoplasm of cervix: Secondary | ICD-10-CM

## 2019-01-15 DIAGNOSIS — E785 Hyperlipidemia, unspecified: Secondary | ICD-10-CM

## 2019-01-15 DIAGNOSIS — E133299 Other specified diabetes mellitus with mild nonproliferative diabetic retinopathy without macular edema, unspecified eye: Secondary | ICD-10-CM | POA: Diagnosis not present

## 2019-01-15 DIAGNOSIS — E876 Hypokalemia: Secondary | ICD-10-CM

## 2019-01-15 DIAGNOSIS — N898 Other specified noninflammatory disorders of vagina: Secondary | ICD-10-CM | POA: Diagnosis present

## 2019-01-15 DIAGNOSIS — N762 Acute vulvitis: Secondary | ICD-10-CM

## 2019-01-15 DIAGNOSIS — Z01419 Encounter for gynecological examination (general) (routine) without abnormal findings: Secondary | ICD-10-CM

## 2019-01-15 LAB — POCT GLYCOSYLATED HEMOGLOBIN (HGB A1C): Hemoglobin A1C: 8.5 % — AB (ref 4.0–5.6)

## 2019-01-15 MED ORDER — TRESIBA FLEXTOUCH 100 UNIT/ML ~~LOC~~ SOPN: 20.0000 [IU] | PEN_INJECTOR | Freq: Every day | SUBCUTANEOUS | 0 refills | Status: DC

## 2019-01-15 MED ORDER — ATORVASTATIN CALCIUM 40 MG PO TABS: 40.0000 mg | ORAL_TABLET | Freq: Every evening | ORAL | 1 refills | Status: DC

## 2019-01-15 MED ORDER — VALACYCLOVIR HCL 500 MG PO TABS: 500.0000 mg | ORAL_TABLET | Freq: Three times a day (TID) | ORAL | 1 refills | Status: DC | PRN

## 2019-01-15 MED ORDER — SYNJARDY XR 25-1000 MG PO TB24: 1.0000 | ORAL_TABLET | Freq: Every day | ORAL | 1 refills | Status: DC

## 2019-01-15 MED ORDER — FLUCONAZOLE 150 MG PO TABS: 150.0000 mg | ORAL_TABLET | ORAL | 0 refills | Status: DC

## 2019-01-15 MED ORDER — BLOOD GLUCOSE METER KIT: PACK | 2 refills | Status: DC

## 2019-01-15 NOTE — Progress Notes (Signed)
Name: Wendy [name]   MRN: [id number]    DOB: 12/28/1960   Date:01/15/2019       Progress Note  Subjective  Chief Complaint  Chief Complaint  Patient presents with  . Annual Exam    HPI  Patient presents for annual CPE and follow up  DM with nephropathy, retinopathy:erhgbA1C went up from 7.1% to 7.7%, 7.5%., 8.1 % ,9.7,8.2%,  7.3%  7.6%and today 8.5%  She has been taking Synajardi and denies side effectsand back on Tresiba 28 units and we will increase to 30 units and she needs to avoid starches  . She is not checking glucose lately because her machine is not working. She denies  polydipsia or polyuria. She has noticed polyphagia   WUJ:WJXBJ shehas been compliant withTelmisartan. HCTZ and bp is usually at goal, but bp is high today, we will recheck before she goes home.She denies chest pain or palpitation   Hyperlipidemia: sheis back on Atorvastatin and last LDL was at goal   Snoring: she did not go to sleep study, and she did not re-schedule it. She always feels tired. Explained importance of sleep study. She has also notices that she has to take a deep breath when sitting and watching TV, she is taking naps during the day  Extremity obesity:she lost weight after she went to [facility] but is gaining it back.   Hot Flashes/Depression and GAD: she has been on Effexor,stable, phq 9 is zero today , no side effects of medication   Herpes: she recently had a rash but not sure if it was herpes, no episodes in years but would like a refill of medication  Hypokalemia: she does not want to have blood drawn today, last level had improved. Discussed high potassium diet   Diet: eating more salads Exercise:  Walking her dogs   USPSTF grade A and B recommendations    [facility] Visit from 10/15/2018 in [facility]  AUDIT-C Score  0     Depression: Phq 9 is  negative Depression screen [facility] 2/9 01/15/2019 10/15/2018 06/27/2018 06/21/2018 03/27/2018  Decreased Interest 0  2 3 2  0  Down, Depressed, Hopeless 0 0 0 0 0  PHQ - 2 Score 0 2 3 2  0  Altered sleeping 0 3 3 0 3  Tired, decreased energy 0 0 0 1 2  Change in appetite 0 0 0 0 2  Feeling bad or failure about yourself  0 0 0 0 0  Trouble concentrating 0 0 0 0 0  Moving slowly or fidgety/restless 0 0 0 0 0  Suicidal thoughts 0 0 0 0 0  PHQ-9 Score 0 5 6 3 7   Difficult doing work/chores - Somewhat difficult Not difficult at all Not difficult at all Somewhat difficult  Some recent data might be hidden   Hypertension: BP Readings from Last 3 Encounters:  01/15/19 (!) 150/96  10/15/18 134/78  06/27/18 124/80   Obesity: Wt Readings from Last 3 Encounters:  01/15/19 261 lb 11.2 oz (118.7 kg)  10/15/18 251 lb 11.2 oz (114.2 kg)  06/27/18 241 lb 4.8 oz (109.5 kg)   BMI Readings from Last 3 Encounters:  01/15/19 46.36 kg/m  10/15/18 44.59 kg/m  06/27/18 42.74 kg/m     Hep C Screening: 07/2013 STD testing and prevention (HIV/chl/gon/syphilis): she just wants to check for vaginal discharge Intimate partner violence:negative  Sexual History/Pain during Intercourse: she has pain during intercourse, discussed using ky jelly  Menstrual History/LMP/Abnormal Bleeding: discussed post-menopausal bleeding Incontinence Symptoms: no  symptoms   Breast cancer:  - Last Mammogram: 03/2018  - BRCA gene screening: N/A  Osteoporosis Screening: discussed high calcium and vitamin D diet   Cervical cancer screening: today   Skin cancer: discussed atypical lesions  Colorectal cancer: repeat in 2023  Lung cancer:  Low Dose CT Chest recommended if Age 68-80 years, 30 pack-year currently smoking OR have quit w/in 15years. Patient does not qualify.   ECG:04/2018  Advanced Care Planning: A voluntary discussion about advance care planning including the explanation and discussion of advance directives.  Discussed health care proxy and Living will, and the patient was able to identify a health care proxy as husband    Patient does have a living will at present time.   Lipids: Lab Results  Component Value Date   CHOL 154 10/15/2018   CHOL 146 05/25/2017   CHOL 149 08/25/2016   Lab Results  Component Value Date   HDL 58 10/15/2018   HDL 62 05/25/2017   HDL 42 (L) 08/25/2016   Lab Results  Component Value Date   LDLCALC 80 10/15/2018   LDLCALC 68 05/25/2017   LDLCALC 83 08/25/2016   Lab Results  Component Value Date   TRIG 82 10/15/2018   TRIG 139 05/28/2018   TRIG 147 05/21/2018   Lab Results  Component Value Date   CHOLHDL 2.7 10/15/2018   CHOLHDL 2.4 05/25/2017   CHOLHDL 3.5 08/25/2016   No results found for: LDLDIRECT  Glucose: Glucose, Bld  Date Value Ref Range Status  10/15/2018 139 (H) 65 - 99 mg/dL Final    Comment:    .            Fasting reference interval . For someone without known diabetes, a glucose value >125 mg/dL indicates that they may have diabetes and this should be confirmed with a follow-up test. .   06/21/2018 170 (H) 65 - 99 mg/dL Final    Comment:    .            Fasting reference interval . For someone without known diabetes, a glucose value >125 mg/dL indicates that they may have diabetes and this should be confirmed with a follow-up test. .   06/04/2018 84 70 - 99 mg/dL Final   Glucose-Capillary  Date Value Ref Range Status  06/04/2018 110 (H) 70 - 99 mg/dL Final  06/03/2018 163 (H) 70 - 99 mg/dL Final  06/03/2018 86 70 - 99 mg/dL Final    Patient Active Problem List   Diagnosis Date Noted  . Endometrial polyp 11/29/2017  . Fibroid 11/29/2017  . Post-menopausal bleeding 11/14/2017  . Mild nonproliferative diabetic retinopathy of both eyes associated with type 2 diabetes mellitus ([place]) 06/09/2016  . Uncontrolled type 2 diabetes mellitus with nonproliferative retinopathy ([place]) 03/01/2016  . Vitamin D deficiency 07/07/2015  . Vitamin B12 deficiency 07/07/2015  . Snoring 12/25/2014  . Allergic rhinitis, seasonal 12/14/2014  .  Chronic constipation 12/14/2014  . Diabetes mellitus with renal manifestation ([place]) 12/14/2014  . Dyslipidemia 12/14/2014  . Dermatitis, eczematoid 12/14/2014  . Edema extremities 12/14/2014  . Essential (primary) hypertension 12/14/2014  . Gastro-esophageal reflux disease without esophagitis 12/14/2014  . Genital herpes in women 12/14/2014  . Morbid obesity ([place]) 12/14/2014  . Female climacteric state 12/14/2014    Past Surgical History:  Procedure Laterality Date  . APPLICATION OF WOUND VAC  04/2018  . CESAREAN SECTION  1993  . LAPAROTOMY N/A 05/14/2018   Procedure: EXPLORATORY LAPAROTOMY;  Surgeon: Georganna Skeans, MD;  Location: [facility];  Service: General;  Laterality: N/A;  . LAPAROTOMY N/A 05/16/2018   Procedure: EXPLORATORY LAPAROTOMY with reanatomosis and fascia closure with wound vacum application;  Surgeon: Erroll Luna, MD;  Location: [facility];  Service: General;  Laterality: N/A;  . tonsillectomy and adnoidectomy    . WISDOM TOOTH EXTRACTION      Family History  Problem Relation Age of Onset  . Diabetes Mother   . Asthma Mother   . Heart disease Mother   . Heart disease Father   . Prostate cancer Father   . Other Father        blockages in stomach  . Kidney disease Paternal Grandmother   . Asthma Son   . Autism Son   . Breast cancer Neg Hx   . Colon cancer Neg Hx   . Esophageal cancer Neg Hx   . Pancreatic cancer Neg Hx   . Rectal cancer Neg Hx   . Stomach cancer Neg Hx     Social History   Socioeconomic History  . Marital status: Married    Spouse name: Not on file  . Number of children: 1  . Years of education: Not on file  . Highest education level: 12th grade  Occupational History    Comment: unemployed   Social Needs  . Financial resource strain: Somewhat hard  . Food insecurity    Worry: Often true    Inability: Often true  . Transportation needs    Medical: No    Non-medical: No  Tobacco Use  . Smoking status: Former Smoker    Years:  20.00    Types: Cigarettes    Start date: 04/19/1971    Quit date: 04/2018    Years since quitting: 0.7  . Smokeless tobacco: Never Used  . Tobacco comment: she quitted and started back October 2019, but quit again 04/2018  Substance and Sexual Activity  . Alcohol use: No    Alcohol/week: 0.0 standard drinks  . Drug use: No  . Sexual activity: Yes    Partners: Male    Birth control/protection: Post-menopausal  Lifestyle  . Physical activity    Days per week: 7 days    Minutes per session: 60 min  . Stress: Not at all  Relationships  . Social Herbalist on phone: Three times a week    Gets together: Once a week    Attends religious service: More than 4 times per year    Active member of club or organization: No    Attends meetings of clubs or organizations: Never    Relationship status: Married  . Intimate partner violence    Fear of current or ex partner: No    Emotionally abused: No    Physically abused: No    Forced sexual activity: No  Other Topics Concern  . Not on file  Social History Narrative   She lost her job Dec 2017 , worked for 36 years at the same company .    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  acetaminophen (TYLENOL) 325 MG tablet, Take 2 tablets (650 mg total) by mouth every 6 (six) hours as needed for mild pain or headache., Disp: 25 tablet, Rfl: 0 .  aspirin (ASPIRIN LOW DOSE) 81  MG chewable tablet, Chew 1 tablet by mouth daily., Disp: , Rfl:  .  atorvastatin (LIPITOR) 40 MG tablet, Take 1 tablet (40 mg total) by mouth every evening., Disp: 90 tablet, Rfl: 1 .  Blood Glucose Monitoring Suppl (ONE TOUCH ULTRA 2) w/Device KIT, 1 kit by Subdermal route See admin instructions., Disp: 1 each, Rfl: 0 .  Cholecalciferol (VITAMIN D) 2000 units CAPS, Take 1 capsule  (2,000 Units total) by mouth daily., Disp: 30 capsule, Rfl: 0 .  Empagliflozin-metFORMIN HCl ER (SYNJARDY XR) 25-1000 MG TB24, Take 1 tablet by mouth daily., Disp: 90 tablet, Rfl: 1 .  glucose blood (ONE TOUCH ULTRA TEST) test strip, CHECK FASTING BLOOD SUGARS TWICE DAILY, Disp: 100 each, Rfl: 2 .  insulin degludec (TRESIBA FLEXTOUCH) 100 UNIT/ML SOPN FlexTouch Pen, Inject 0.2-0.5 mLs (20-50 Units total) into the skin daily., Disp: 15 mL, Rfl: 0 .  Insulin Pen Needle (NOVOFINE) 32G X 6 MM MISC, 1 each by Does not apply route daily., Disp: 100 each, Rfl: 2 .  ONETOUCH DELICA LANCETS FINE MISC, See admin instructions., Disp: , Rfl: 0 .  telmisartan-hydrochlorothiazide (MICARDIS HCT) 80-25 MG tablet, TAKE 1 TABLET BY MOUTH EVERY DAY, Disp: 90 tablet, Rfl: 1 .  valACYclovir (VALTREX) 500 MG tablet, Take 1 tablet (500 mg total) by mouth 3 (three) times daily as needed (for prevention). And daily for prevention, Disp: 100 tablet, Rfl: 1 .  venlafaxine XR (EFFEXOR-XR) 75 MG 24 hr capsule, TAKE 2 CAPSULES (150 MG TOTAL) BY MOUTH DAILY., Disp: 60 capsule, Rfl: 0 .  blood glucose meter kit and supplies, Dispense based on patient and insurance preference. Use up to four times daily as directed. (FOR ICD-10 E10.9, E11.9)., Disp: 1 each, Rfl: 2 .  fluticasone (FLONASE) 50 MCG/ACT nasal spray, Place 2 sprays into both nostrils daily. (Patient not taking: Reported on 01/15/2019), Disp: 16 g, Rfl: 0  Allergies  Allergen Reactions  . Penicillins Swelling    Did it involve swelling of the face/tongue/throat, SOB, or low BP? Yes Did it involve sudden or severe rash/hives, skin peeling, or any reaction on the inside of your mouth or nose? No Did you need to seek medical attention at a hospital or doctor's office? Yes When did it last happen? unknown If all above answers are "NO", may proceed with cephalosporin use.      ROS  Constitutional: Negative for fever or weight change.  Respiratory: Negative for cough  and shortness of breath.   Cardiovascular: Negative for chest pain or palpitations.  Gastrointestinal: Negative for abdominal pain, no bowel changes.  Musculoskeletal: Negative for gait problem or joint swelling.  Skin: Negative for rash.  Neurological: Negative for dizziness or headache.  No other specific complaints in a complete review of systems (except as listed in HPI above).  Objective  Vitals:   01/15/19 1452  BP: (!) 150/96  Pulse: 82  Resp: 16  Temp: (!) 96.2 F (35.7 C)  TempSrc: Temporal  SpO2: 98%  Weight: 261 lb 11.2 oz (118.7 kg)  Height: 5' 3"  (1.6 m)    Body mass index is 46.36 kg/m.  Physical Exam  Constitutional: Patient appears well-developed and morbidly obese. No distress.  HENT: Head: Normocephalic and atraumatic. Ears: B TMs ok, no erythema or effusion; Nose: Nose normal. Mouth/Throat: Oropharynx is clear and moist. No oropharyngeal exudate.  Eyes: Conjunctivae and EOM are normal. Pupils are equal, round, and reactive to light. No scleral icterus.  Neck: Normal range of motion. Neck supple. No JVD  present. No thyromegaly present.  Cardiovascular: Normal rate, regular rhythm and normal heart sounds.  No murmur heard. No BLE edema. Pulmonary/Chest: Effort normal and breath sounds normal. No respiratory distress. Abdominal: Soft. Bowel sounds are normal, no distension. There is no tenderness. no masses Breast: no lumps or masses, no nipple discharge or rashes FEMALE GENITALIA:  External genitalia normal External urethra normal Vaginal vault normal without discharge or lesions Cervix normal without discharge or lesions Bimanual exam normal without masses RECTAL: not done  Musculoskeletal: Normal range of motion, no joint effusions. No gross deformities Neurological: he is alert and oriented to person, place, and time. No cranial nerve deficit. Coordination, balance, strength, speech and gait are normal.  Skin: Skin is warm and dry. No rash noted. No  erythema.  Psychiatric: Patient has a normal mood and affect. behavior is normal. Judgment and thought content normal.  Fall Risk: Fall Risk  10/15/2018 06/27/2018 03/27/2018 02/06/2018 12/29/2017  Falls in the past year? 0 0 1 No No  Number falls in past yr: 0 0 1 - -  Injury with Fall? 0 0 0 - -  Risk for fall due to : - - History of fall(s) - -  Follow up - - Falls prevention discussed - -     Functional Status Survey: Is the patient deaf or have difficulty hearing?: No Does the patient have difficulty seeing, even when wearing glasses/contacts?: No Does the patient have difficulty concentrating, remembering, or making decisions?: No Does the patient have difficulty walking or climbing stairs?: No Does the patient have difficulty dressing or bathing?: No Does the patient have difficulty doing errands alone such as visiting a doctor's office or shopping?: No   Assessment & Plan  1. Dyslipidemia  - atorvastatin (LIPITOR) 40 MG tablet; Take 1 tablet (40 mg total) by mouth every evening.  Dispense: 90 tablet; Refill: 1  2. Nonproliferative retinopathy due to secondary diabetes mellitus (HCC)  - Empagliflozin-metFORMIN HCl ER (SYNJARDY XR) 25-1000 MG TB24; Take 1 tablet by mouth daily.  Dispense: 90 tablet; Refill: 1 - insulin degludec (TRESIBA FLEXTOUCH) 100 UNIT/ML SOPN FlexTouch Pen; Inject 0.2-0.5 mLs (20-50 Units total) into the skin daily.  Dispense: 15 mL; Refill: 0 - Microalbumin / creatinine urine ratio - POCT glycosylated hemoglobin (Hb A1C) - blood glucose meter kit and supplies; Dispense based on patient and insurance preference. Use up to four times daily as directed. (FOR ICD-10 E10.9, E11.9).  Dispense: 1 each; Refill: 2  3. Herpes simplex vulvovaginitis  - valACYclovir (VALTREX) 500 MG tablet; Take 1 tablet (500 mg total) by mouth 3 (three) times daily as needed (for prevention). And daily for prevention  Dispense: 100 tablet; Refill: 1  4. Morbid obesity  ([place])  Discussed with the patient the risk posed by an increased BMI. Discussed importance of portion control, calorie counting and at least 150 minutes of physical activity weekly. Avoid sweet beverages and drink more water. Eat at least 6 servings of fruit and vegetables daily   5. Essential (primary) hypertension  BP elevated today   6. Hypokalemia   7. Well woman exam   8. Cervical cancer screening  - Cytology - PAP  9. Breast cancer screening  - MM 3D SCREEN BREAST BILATERAL; Future  10. Vaginal discharge  - Cytology - PAP  11. Acute vulvitis  - fluconazole (DIFLUCAN) 150 MG tablet; Take 1 tablet (150 mg total) by mouth every other day.  Dispense: 3 tablet; Refill: 0  -USPSTF grade A and B recommendations  reviewed with patient; age-appropriate recommendations, preventive care, screening tests, etc discussed and encouraged; healthy living encouraged; see AVS for patient education given to patient -Discussed importance of 150 minutes of physical activity weekly, eat two servings of fish weekly, eat one serving of tree nuts ( cashews, pistachios, pecans, almonds.Marland Kitchen) every other day, eat 6 servings of fruit/vegetables daily and drink plenty of water and avoid sweet beverages.

## 2019-01-15 NOTE — Patient Instructions (Signed)

## 2019-01-16 LAB — MICROALBUMIN / CREATININE URINE RATIO
Creatinine, Urine: 54 mg/dL (ref 20–275)
Microalb Creat Ratio: 4 mcg/mg creat (ref <30)
Microalb, Ur: 0.2 mg/dL

## 2019-01-18 ENCOUNTER — Other Ambulatory Visit: Payer: Self-pay | Admitting: Family Medicine

## 2019-01-18 DIAGNOSIS — E1121 Type 2 diabetes mellitus with diabetic nephropathy: Secondary | ICD-10-CM

## 2019-01-18 MED ORDER — LANTUS SOLOSTAR 100 UNIT/ML ~~LOC~~ SOPN: 50.0000 [IU] | PEN_INJECTOR | Freq: Every day | SUBCUTANEOUS | 99 refills | Status: DC

## 2019-01-21 LAB — CYTOLOGY - PAP
Adequacy: ABSENT
Chlamydia: NEGATIVE
Diagnosis: NEGATIVE
High risk HPV: NEGATIVE
Neisseria Gonorrhea: NEGATIVE
Trichomonas: NEGATIVE

## 2019-01-24 ENCOUNTER — Telehealth: Payer: Self-pay | Admitting: Family Medicine

## 2019-01-24 NOTE — Telephone Encounter (Signed)
Patient would like a return call from Dr. Ancil Boozer medical assistant concerning her Insulin Glargine (LANTUS SOLOSTAR) 100 UNIT/ML Solostar Pen

## 2019-01-24 NOTE — Telephone Encounter (Signed)
Patient is calling again about cost of insulin. I have looked for coupon's, good rx discounts and vouchers nothing is available at a lower cost. Please advise.

## 2019-01-25 ENCOUNTER — Other Ambulatory Visit: Payer: Self-pay | Admitting: Family Medicine

## 2019-01-25 MED ORDER — STERILE DILUENT FOR HUMULIN INSULINS: SUBCUTANEOUS | 0 refills | Status: DC

## 2019-01-25 NOTE — Telephone Encounter (Signed)
Patient calling to check status of getting medication change. Would like a call back.

## 2019-01-25 NOTE — Telephone Encounter (Signed)
I'm not sure either. Maybe you should call her because I can't get through to her.

## 2019-01-25 NOTE — Telephone Encounter (Signed)
Patient does not want to take that long acting insulin, she states it will cause more problems. She said to give her whatever kind of insulin she can get with one of those $5 coupons you got. She also wanted you to know she had some insulin in her refrigerator that starts with an S that she has been taking, but she is about to run out. Also stated that chronic care management can't help here.

## 2019-01-25 NOTE — Telephone Encounter (Signed)
Referral is placed please sign. Patient is unable to pay $70 for insulin.

## 2019-02-08 ENCOUNTER — Other Ambulatory Visit: Payer: Self-pay | Admitting: Family Medicine

## 2019-02-08 DIAGNOSIS — F411 Generalized anxiety disorder: Secondary | ICD-10-CM

## 2019-02-08 DIAGNOSIS — N951 Menopausal and female climacteric states: Secondary | ICD-10-CM

## 2019-02-08 NOTE — Telephone Encounter (Signed)
Requested medication (s) are due for refill today: yes  Requested medication (s) are on the active medication list: yes  Last refill:  01/15/2019  Future visit scheduled: yes  Notes to clinic:  Requesting 90 day supply   Requested Prescriptions  Pending Prescriptions Disp Refills   venlafaxine XR (EFFEXOR-XR) 75 MG 24 hr capsule [Pharmacy Med Name: VENLAFAXINE HCL ER 75 MG CAP] 180 capsule 1    Sig: TAKE 2 CAPSULES (150 MG TOTAL) BY MOUTH DAILY.     Psychiatry: Antidepressants - SNRI - desvenlafaxine & venlafaxine Failed - 02/08/2019  8:32 AM      Failed - Last BP in normal range    BP Readings from Last 1 Encounters:  01/15/19 (!) 150/96         Failed - Completed PHQ-2 or PHQ-9 in the last 360 days.      Passed - LDL in normal range and within 360 days    LDL Cholesterol (Calc)  Date Value Ref Range Status  10/15/2018 80 mg/dL (calc) Final    Comment:    Reference range: <100 . Desirable range <100 mg/dL for primary prevention;   <70 mg/dL for patients with CHD or diabetic patients  with > or = 2 CHD risk factors. Marland Kitchen LDL-C is now calculated using the Martin-Hopkins  calculation, which is a validated novel method providing  better accuracy than the Friedewald equation in the  estimation of LDL-C.  Cresenciano Genre et al. Annamaria Helling. [id number]): [id number]  (http://education.[facility].com/faq/FAQ164)          Passed - Total Cholesterol in normal range and within 360 days    Cholesterol, Total  Date Value Ref Range Status  12/25/2014 140 100 - 199 mg/dL Final   Cholesterol  Date Value Ref Range Status  10/15/2018 154 <200 mg/dL Final         Passed - Triglycerides in normal range and within 360 days    Triglycerides  Date Value Ref Range Status  10/15/2018 82 <150 mg/dL Final         Passed - Valid encounter within last 6 months    Recent Outpatient Visits          3 weeks ago Morbid obesity [facility])   [facility] [place], Drue Stager, MD   3  months ago Type 2 diabetes with nephropathy [facility])   [facility] [place], Drue Stager, MD   7 months ago Nonproliferative retinopathy due to secondary diabetes mellitus [facility])   [facility] Steele Sizer, MD   7 months ago Hospital discharge follow-up   [facility] Steele Sizer, MD   10 months ago Well woman exam   [facility] Steele Sizer, MD      Future Appointments            In 2 months Ancil Boozer, Drue Stager, MD [facility], [facility]

## 2019-02-13 ENCOUNTER — Other Ambulatory Visit: Payer: Self-pay

## 2019-02-13 MED ORDER — XULTOPHY 100-3.6 UNIT-MG/ML ~~LOC~~ SOPN: 16.0000 [IU] | PEN_INJECTOR | Freq: Every day | SUBCUTANEOUS | 0 refills | Status: DC

## 2019-02-13 NOTE — Telephone Encounter (Signed)
Would like to go back to [place] and try the coupon.

## 2019-02-18 ENCOUNTER — Other Ambulatory Visit: Payer: Self-pay | Admitting: Family Medicine

## 2019-02-18 DIAGNOSIS — A6004 Herpesviral vulvovaginitis: Secondary | ICD-10-CM

## 2019-02-18 NOTE — Telephone Encounter (Signed)
Requested medication (s) are due for refill today: yes  Requested medication (s) are on the active medication list: yes  Last refill:  01/15/2019  Future visit scheduled:yes  Notes to clinic:  Patient requesting a 90 day supply  Requested Prescriptions  Pending Prescriptions Disp Refills   valACYclovir (VALTREX) 500 MG tablet [Pharmacy Med Name: VALACYCLOVIR HCL 500 MG TABLET] 270 tablet 1    Sig: TAKE 1 TABLET BY MOUTH THREE TIMES A DAY AS NEEDED     Antimicrobials:  Antiviral Agents - Anti-Herpetic Passed - 02/18/2019 10:24 AM      Passed - Valid encounter within last 12 months    Recent Outpatient Visits          1 month ago Morbid obesity [facility])   [facility] [place], Drue Stager, MD   4 months ago Type 2 diabetes with nephropathy [facility])   [facility] [place], Drue Stager, MD   7 months ago Nonproliferative retinopathy due to secondary diabetes mellitus [facility])   [facility] Steele Sizer, MD   8 months ago Hospital discharge follow-up   [facility] Steele Sizer, MD   10 months ago Well woman exam   [facility] Steele Sizer, MD      Future Appointments            In 2 months Ancil Boozer, Drue Stager, MD [facility], [facility]

## 2019-03-03 ENCOUNTER — Other Ambulatory Visit: Payer: Self-pay | Admitting: Family Medicine

## 2019-03-03 DIAGNOSIS — F411 Generalized anxiety disorder: Secondary | ICD-10-CM

## 2019-03-03 DIAGNOSIS — N951 Menopausal and female climacteric states: Secondary | ICD-10-CM

## 2019-04-22 ENCOUNTER — Ambulatory Visit: Payer: 59 | Admitting: Family Medicine

## 2019-05-13 ENCOUNTER — Ambulatory Visit: Payer: 59 | Admitting: Family Medicine

## 2019-06-19 ENCOUNTER — Other Ambulatory Visit: Payer: Self-pay

## 2019-06-19 ENCOUNTER — Encounter: Payer: Self-pay | Admitting: Family Medicine

## 2019-06-19 ENCOUNTER — Ambulatory Visit (INDEPENDENT_AMBULATORY_CARE_PROVIDER_SITE_OTHER): Payer: 59 | Admitting: Family Medicine

## 2019-06-19 VITALS — BP 130/80 | HR 102 | Temp 97.9°F | Resp 16 | Ht 63.0 in | Wt 259.6 lb

## 2019-06-19 DIAGNOSIS — E785 Hyperlipidemia, unspecified: Secondary | ICD-10-CM

## 2019-06-19 DIAGNOSIS — E538 Deficiency of other specified B group vitamins: Secondary | ICD-10-CM

## 2019-06-19 DIAGNOSIS — R6889 Other general symptoms and signs: Secondary | ICD-10-CM

## 2019-06-19 DIAGNOSIS — I10 Essential (primary) hypertension: Secondary | ICD-10-CM

## 2019-06-19 DIAGNOSIS — E1121 Type 2 diabetes mellitus with diabetic nephropathy: Secondary | ICD-10-CM | POA: Diagnosis not present

## 2019-06-19 DIAGNOSIS — F411 Generalized anxiety disorder: Secondary | ICD-10-CM

## 2019-06-19 DIAGNOSIS — N951 Menopausal and female climacteric states: Secondary | ICD-10-CM

## 2019-06-19 DIAGNOSIS — R1110 Vomiting, unspecified: Secondary | ICD-10-CM

## 2019-06-19 DIAGNOSIS — R0683 Snoring: Secondary | ICD-10-CM

## 2019-06-19 DIAGNOSIS — E559 Vitamin D deficiency, unspecified: Secondary | ICD-10-CM

## 2019-06-19 DIAGNOSIS — Z862 Personal history of diseases of the blood and blood-forming organs and certain disorders involving the immune mechanism: Secondary | ICD-10-CM

## 2019-06-19 DIAGNOSIS — A6004 Herpesviral vulvovaginitis: Secondary | ICD-10-CM

## 2019-06-19 DIAGNOSIS — E133299 Other specified diabetes mellitus with mild nonproliferative diabetic retinopathy without macular edema, unspecified eye: Secondary | ICD-10-CM

## 2019-06-19 LAB — POCT GLYCOSYLATED HEMOGLOBIN (HGB A1C): HbA1c, POC (controlled diabetic range): 7.4 % — AB (ref 0.0–7.0)

## 2019-06-19 MED ORDER — TELMISARTAN-HCTZ 80-25 MG PO TABS: 1.0000 | ORAL_TABLET | Freq: Every day | ORAL | 1 refills | Status: DC

## 2019-06-19 MED ORDER — XULTOPHY 100-3.6 UNIT-MG/ML ~~LOC~~ SOPN: 22.0000 [IU] | PEN_INJECTOR | Freq: Every day | SUBCUTANEOUS | 0 refills | Status: DC

## 2019-06-19 MED ORDER — BUSPIRONE HCL 5 MG PO TABS: 5.0000 mg | ORAL_TABLET | Freq: Every day | ORAL | 0 refills | Status: DC | PRN

## 2019-06-19 MED ORDER — ATORVASTATIN CALCIUM 40 MG PO TABS: 40.0000 mg | ORAL_TABLET | Freq: Every evening | ORAL | 1 refills | Status: DC

## 2019-06-19 MED ORDER — SYNJARDY XR 25-1000 MG PO TB24: 1.0000 | ORAL_TABLET | Freq: Every day | ORAL | 1 refills | Status: DC

## 2019-06-19 MED ORDER — VENLAFAXINE HCL ER 75 MG PO CP24: 75.0000 mg | ORAL_CAPSULE | Freq: Two times a day (BID) | ORAL | 2 refills | Status: DC

## 2019-06-19 NOTE — Progress Notes (Signed)
Name: Wendy [name]   MRN: [id number]    DOB: December 06, 1960   Date:06/19/2019       Progress Note  Subjective  Chief Complaint  Chief Complaint  Patient presents with  . Medication Refill  . Diabetes  . Hypertension  . Depression  . GAD  . Hyperlipidemia  . Hot Flashes    HPI  DM with nephropathy, retinopathy:erhgbA1C went up from 7.1% to 7.7%, 7.5%., 8.1 % ,9.7,8.2%,  7.3%7.6%,8.5% but today is down again at 7.4%  She has been taking Synajardi she is also on Xultophy now but unable to go above 22 because it causes diarrhea . She denies  polydipsia or polyuria. She has noticed polyphagia   Cold intolerance: she states she is usually always hot but she has noticed she is feeling cold lately. We will recheck for anemia and also TSH  Recurrent vomiting: she was admitted to [facility] 05/2018 and had small bowel obstruction with sepsis and perforation. Dr. Lavone Neri did her exploratory laparotomy and she states since having recurrent vomiting she would like to go back . She denies any pain, no blood in stools.   CXK:GYJEH shehas been compliant withTelmisartan. HCTZ, bp is at goal today, no chest pain or palpitation   Hyperlipidemia: sheis back on Atorvastatin and last LDL was at goal , we will recheck it yearly   Snoring: she did not go to sleep study, and she did not re-schedule it. She always feels tired. Explained importance of sleep study.She has also notices that she has to take a deep breath when sitting and watching TV, she is taking naps during the day. She states she is worried about living her son at home ( he is grown but has autism)  Discussed home sleep study and she is wiling to do that.   Extremity obesity:weight has been stable since last visit,   Hot Flashes/Depression and GAD: she has been on Effexor, no side effects of medication , she states it helps with hot flashes, she can only tolerate effexor one in am and one at lunch , but not 150 mg at once. She always  worries about her son that has autism, he repeats himself, gets annoyed, but also worries what will happen to him when she dies.   Herpes: she recently had a rash but not sure if it was herpes, no episodes in years but would like a refill of medication  Hypokalemia: we will recheck labs today   Patient Active Problem List   Diagnosis Date Noted  . Endometrial polyp 11/29/2017  . Fibroid 11/29/2017  . Post-menopausal bleeding 11/14/2017  . Mild nonproliferative diabetic retinopathy of both eyes associated with type 2 diabetes mellitus ([place]) 06/09/2016  . Uncontrolled type 2 diabetes mellitus with nonproliferative retinopathy ([place]) 03/01/2016  . Vitamin D deficiency 07/07/2015  . Vitamin B12 deficiency 07/07/2015  . Snoring 12/25/2014  . Allergic rhinitis, seasonal 12/14/2014  . Chronic constipation 12/14/2014  . Diabetes mellitus with renal manifestation ([place]) 12/14/2014  . Dyslipidemia 12/14/2014  . Dermatitis, eczematoid 12/14/2014  . Edema extremities 12/14/2014  . Essential (primary) hypertension 12/14/2014  . Gastro-esophageal reflux disease without esophagitis 12/14/2014  . Genital herpes in women 12/14/2014  . Morbid obesity ([place]) 12/14/2014  . Female climacteric state 12/14/2014    Past Surgical History:  Procedure Laterality Date  . APPLICATION OF WOUND VAC  04/2018  . CESAREAN SECTION  1993  . LAPAROTOMY N/A 05/14/2018   Procedure: EXPLORATORY LAPAROTOMY;  Surgeon: Georganna Skeans, MD;  Location: [facility];  Service: General;  Laterality: N/A;  . LAPAROTOMY N/A 05/16/2018   Procedure: EXPLORATORY LAPAROTOMY with reanatomosis and fascia closure with wound vacum application;  Surgeon: Erroll Luna, MD;  Location: [facility];  Service: General;  Laterality: N/A;  . tonsillectomy and adnoidectomy    . WISDOM TOOTH EXTRACTION      Family History  Problem Relation Age of Onset  . Diabetes Mother   . Asthma Mother   . Heart disease Mother   . Heart disease Father   .  Prostate cancer Father   . Other Father        blockages in stomach  . Kidney disease Paternal Grandmother   . Asthma Son   . Autism Son   . Breast cancer Neg Hx   . Colon cancer Neg Hx   . Esophageal cancer Neg Hx   . Pancreatic cancer Neg Hx   . Rectal cancer Neg Hx   . Stomach cancer Neg Hx     Social History   Tobacco Use  . Smoking status: Former Smoker    Years: 20.00    Types: Cigarettes    Start date: 04/19/1971    Quit date: 04/2018    Years since quitting: 1.1  . Smokeless tobacco: Never Used  . Tobacco comment: she quitted and started back October 2019, but quit again 04/2018  Substance Use Topics  . Alcohol use: No    Alcohol/week: 0.0 standard drinks     Current Outpatient Medications:  .  acetaminophen (TYLENOL) 325 MG tablet, Take 2 tablets (650 mg total) by mouth every 6 (six) hours as needed for mild pain or headache., Disp: 25 tablet, Rfl: 0 .  aspirin (ASPIRIN LOW DOSE) 81 MG chewable tablet, Chew 1 tablet by mouth daily., Disp: , Rfl:  .  atorvastatin (LIPITOR) 40 MG tablet, Take 1 tablet (40 mg total) by mouth every evening., Disp: 90 tablet, Rfl: 1 .  blood glucose meter kit and supplies, Dispense based on patient and insurance preference. Use up to four times daily as directed. (FOR ICD-10 E10.9, E11.9)., Disp: 1 each, Rfl: 2 .  Blood Glucose Monitoring Suppl (ONE TOUCH ULTRA 2) w/Device KIT, 1 kit by Subdermal route See admin instructions., Disp: 1 each, Rfl: 0 .  Cholecalciferol (VITAMIN D) 2000 units CAPS, Take 1 capsule (2,000 Units total) by mouth daily., Disp: 30 capsule, Rfl: 0 .  Empagliflozin-metFORMIN HCl ER (SYNJARDY XR) 25-1000 MG TB24, Take 1 tablet by mouth daily., Disp: 90 tablet, Rfl: 1 .  fluconazole (DIFLUCAN) 150 MG tablet, Take 1 tablet (150 mg total) by mouth every other day., Disp: 3 tablet, Rfl: 0 .  fluticasone (FLONASE) 50 MCG/ACT nasal spray, Place 2 sprays into both nostrils daily., Disp: 16 g, Rfl: 0 .  glucose blood (ONE TOUCH  ULTRA TEST) test strip, CHECK FASTING BLOOD SUGARS TWICE DAILY, Disp: 100 each, Rfl: 2 .  Insulin Degludec-Liraglutide (XULTOPHY) 100-3.6 UNIT-MG/ML SOPN, Inject 22 Units into the skin daily., Disp: 15 pen, Rfl: 0 .  Insulin Pen Needle (NOVOFINE) 32G X 6 MM MISC, 1 each by Does not apply route daily., Disp: 100 each, Rfl: 2 .  ONETOUCH DELICA LANCETS FINE MISC, See admin instructions., Disp: , Rfl: 0 .  telmisartan-hydrochlorothiazide (MICARDIS HCT) 80-25 MG tablet, Take 1 tablet by mouth daily., Disp: 90 tablet, Rfl: 1 .  valACYclovir (VALTREX) 500 MG tablet, Take 1 tablet (500 mg total) by mouth 3 (three) times daily as needed (for prevention). And daily for prevention, Disp: 100 tablet,  Rfl: 1 .  venlafaxine XR (EFFEXOR-XR) 75 MG 24 hr capsule, Take 1 capsule (75 mg total) by mouth 2 (two) times daily., Disp: 60 capsule, Rfl: 2 .  busPIRone (BUSPAR) 5 MG tablet, Take 1 tablet (5 mg total) by mouth daily as needed., Disp: 30 tablet, Rfl: 0  Allergies  Allergen Reactions  . Penicillins Swelling    Did it involve swelling of the face/tongue/throat, SOB, or low BP? Yes Did it involve sudden or severe rash/hives, skin peeling, or any reaction on the inside of your mouth or nose? No Did you need to seek medical attention at a hospital or doctor's office? Yes When did it last happen? unknown If all above answers are "NO", may proceed with cephalosporin use.     I personally reviewed active problem list, medication list, allergies, family history, social history, health maintenance with the patient/caregiver today.   ROS  Constitutional: Negative for fever or weight change.  Respiratory: Negative for cough and shortness of breath.   Cardiovascular: Negative for chest pain or palpitations.  Gastrointestinal: Negative for abdominal pain, no bowel changes.  Musculoskeletal: Negative for gait problem or joint swelling.  Skin: Negative for rash.  Neurological: Negative for dizziness or headache.   No other specific complaints in a complete review of systems (except as listed in HPI above).   Objective  Vitals:   06/19/19 1506  BP: 130/80  Pulse: (!) 102  Resp: 16  Temp: 97.9 F (36.6 C)  TempSrc: Temporal  SpO2: 99%  Weight: 259 lb 9.6 oz (117.8 kg)  Height: 5' 3"  (1.6 m)    Body mass index is 45.99 kg/m.  Physical Exam  Constitutional: Patient appears well-developed and well-nourished. Obese  No distress.  HEENT: head atraumatic, normocephalic, pupils equal and reactive to light Cardiovascular: Normal rate, regular rhythm and normal heart sounds.  No murmur heard. No BLE edema. Pulmonary/Chest: Effort normal and breath sounds normal. No respiratory distress. Abdominal: Soft.  There is no tenderness. Psychiatric: Patient has a normal mood and affect. behavior is normal. Judgment and thought content normal.  Recent Results (from the past 2160 hour(s))  POCT HgB A1C     Status: Abnormal   Collection Time: 06/19/19  3:15 PM  Result Value Ref Range   Hemoglobin A1C     HbA1c POC (<> result, manual entry)     HbA1c, POC (prediabetic range)     HbA1c, POC (controlled diabetic range) 7.4 (A) 0.0 - 7.0 %      PHQ2/9: Depression screen [facility] 2/9 06/19/2019 01/15/2019 10/15/2018 06/27/2018 06/21/2018  Decreased Interest 1 0 2 3 2   Down, Depressed, Hopeless 0 0 0 0 0  PHQ - 2 Score 1 0 2 3 2   Altered sleeping 2 0 3 3 0  Tired, decreased energy 0 0 0 0 1  Change in appetite 1 0 0 0 0  Feeling bad or failure about yourself  0 0 0 0 0  Trouble concentrating 0 0 0 0 0  Moving slowly or fidgety/restless 0 0 0 0 0  Suicidal thoughts 0 0 0 0 0  PHQ-9 Score 4 0 5 6 3   Difficult doing work/chores Somewhat difficult - Somewhat difficult Not difficult at all Not difficult at all  Some recent data might be hidden    phq 9 is positive  GAD 7 : Generalized Anxiety Score 06/19/2019 10/15/2018  Nervous, Anxious, on Edge 3 3  Control/stop worrying 3 0  Worry too much - different  things 3  0  Trouble relaxing 3 3  Restless 2 0  Easily annoyed or irritable 3 0  Afraid - awful might happen 0 0  Total GAD 7 Score 17 6  Anxiety Difficulty Very difficult Somewhat difficult     Fall Risk: Fall Risk  06/19/2019 10/15/2018 06/27/2018 03/27/2018 02/06/2018  Falls in the past year? 0 0 0 1 No  Number falls in past yr: 0 0 0 1 -  Injury with Fall? 0 0 0 0 -  Risk for fall due to : - - - History of fall(s) -  Follow up - - - Falls prevention discussed -     Functional Status Survey: Is the patient deaf or have difficulty hearing?: No Does the patient have difficulty seeing, even when wearing glasses/contacts?: No Does the patient have difficulty concentrating, remembering, or making decisions?: No Does the patient have difficulty walking or climbing stairs?: No Does the patient have difficulty dressing or bathing?: No Does the patient have difficulty doing errands alone such as visiting a doctor's office or shopping?: No   Assessment & Plan  1. Type 2 diabetes with nephropathy (HCC)  - POCT HgB A1C - Home sleep test  2. Dyslipidemia  - atorvastatin (LIPITOR) 40 MG tablet; Take 1 tablet (40 mg total) by mouth every evening.  Dispense: 90 tablet; Refill: 1  3. Essential (primary) hypertension  - COMPLETE METABOLIC PANEL WITH GFR - CBC with Differential/Platelet - telmisartan-hydrochlorothiazide (MICARDIS HCT) 80-25 MG tablet; Take 1 tablet by mouth daily.  Dispense: 90 tablet; Refill: 1 - Home sleep test  4. Morbid obesity ([place])  Discussed with the patient the risk posed by an increased BMI. Discussed importance of portion control, calorie counting and at least 150 minutes of physical activity weekly. Avoid sweet beverages and drink more water. Eat at least 6 servings of fruit and vegetables daily   5. Vitamin B12 deficiency  - Vitamin B12  6. Vitamin D deficiency  - VITAMIN D 25 Hydroxy (Vit-D Deficiency, Fractures)  7. GAD (generalized anxiety  disorder)  - venlafaxine XR (EFFEXOR-XR) 75 MG 24 hr capsule; Take 1 capsule (75 mg total) by mouth 2 (two) times daily.  Dispense: 60 capsule; Refill: 2 - busPIRone (BUSPAR) 5 MG tablet; Take 1 tablet (5 mg total) by mouth daily as needed.  Dispense: 30 tablet; Refill: 0  8. History of anemia  - CBC with Differential/Platelet  9. Cold intolerance  - CBC with Differential/Platelet - TSH  10. Female climacteric state  - venlafaxine XR (EFFEXOR-XR) 75 MG 24 hr capsule; Take 1 capsule (75 mg total) by mouth 2 (two) times daily.  Dispense: 60 capsule; Refill: 2  11. Herpes simplex vulvovaginitis   12. Nonproliferative retinopathy due to secondary diabetes mellitus (HCC)  - Empagliflozin-metFORMIN HCl ER (SYNJARDY XR) 25-1000 MG TB24; Take 1 tablet by mouth daily.  Dispense: 90 tablet; Refill: 1  13. Recurrent vomiting  - Ambulatory referral to General Surgery  14. Snoring  - Home sleep test

## 2019-06-20 LAB — COMPLETE METABOLIC PANEL WITH GFR
AG Ratio: 1.8 (calc) (ref 1.0–2.5)
ALT: 20 U/L (ref 6–29)
AST: 14 U/L (ref 10–35)
Albumin: 4 g/dL (ref 3.6–5.1)
Alkaline phosphatase (APISO): 115 U/L (ref 37–153)
BUN: 10 mg/dL (ref 7–25)
CO2: 27 mmol/L (ref 20–32)
Calcium: 9.3 mg/dL (ref 8.6–10.4)
Chloride: 108 mmol/L (ref 98–110)
Creat: 0.78 mg/dL (ref 0.50–1.05)
GFR, Est African American: 96 mL/min/{1.73_m2} (ref >=60)
GFR, Est Non African American: 83 mL/min/{1.73_m2} (ref >=60)
Globulin: 2.2 g/dL (calc) (ref 1.9–3.7)
Glucose, Bld: 262 mg/dL — ABNORMAL HIGH (ref 65–99)
Potassium: 3.4 mmol/L — ABNORMAL LOW (ref 3.5–5.3)
Sodium: 142 mmol/L (ref 135–146)
Total Bilirubin: 0.3 mg/dL (ref 0.2–1.2)
Total Protein: 6.2 g/dL (ref 6.1–8.1)

## 2019-06-20 LAB — CBC WITH DIFFERENTIAL/PLATELET
Absolute Monocytes: 473 cells/uL (ref 200–950)
Basophils Absolute: 26 cells/uL (ref 0–200)
Basophils Relative: 0.3 %
Eosinophils Absolute: 232 cells/uL (ref 15–500)
Eosinophils Relative: 2.7 %
HCT: 42.2 % (ref 35.0–45.0)
Hemoglobin: 13.8 g/dL (ref 11.7–15.5)
Lymphs Abs: 2778 cells/uL (ref 850–3900)
MCH: 28.7 pg (ref 27.0–33.0)
MCHC: 32.7 g/dL (ref 32.0–36.0)
MCV: 87.7 fL (ref 80.0–100.0)
MPV: 11 fL (ref 7.5–12.5)
Monocytes Relative: 5.5 %
Neutro Abs: 5091 cells/uL (ref 1500–7800)
Neutrophils Relative %: 59.2 %
Platelets: 286 10*3/uL (ref 140–400)
RBC: 4.81 10*6/uL (ref 3.80–5.10)
RDW: 14.1 % (ref 11.0–15.0)
Total Lymphocyte: 32.3 %
WBC: 8.6 10*3/uL (ref 3.8–10.8)

## 2019-06-20 LAB — VITAMIN B12: Vitamin B-12: 297 pg/mL (ref 200–1100)

## 2019-06-20 LAB — TSH: TSH: 1.49 mIU/L (ref 0.40–4.50)

## 2019-06-20 LAB — VITAMIN D 25 HYDROXY (VIT D DEFICIENCY, FRACTURES): Vit D, 25-Hydroxy: 19 ng/mL — ABNORMAL LOW (ref 30–100)

## 2019-06-27 ENCOUNTER — Ambulatory Visit: Payer: Self-pay

## 2019-06-27 NOTE — Telephone Encounter (Signed)
Incoming call from Patient  Requesting lab results. Provided to Patient Dr.  Ancil Boozer notes of 06/23/19.  Patient voiced understanding.  State that she take her D3.  and B12.

## 2019-07-03 ENCOUNTER — Other Ambulatory Visit: Payer: Self-pay | Admitting: Family Medicine

## 2019-07-03 DIAGNOSIS — F411 Generalized anxiety disorder: Secondary | ICD-10-CM

## 2019-07-03 NOTE — Telephone Encounter (Signed)
Pharmacy is requesting changes to original Rx- sent for review of request

## 2019-08-02 ENCOUNTER — Other Ambulatory Visit: Payer: Self-pay | Admitting: Family Medicine

## 2019-08-02 DIAGNOSIS — F411 Generalized anxiety disorder: Secondary | ICD-10-CM

## 2019-08-02 DIAGNOSIS — N951 Menopausal and female climacteric states: Secondary | ICD-10-CM

## 2019-08-25 ENCOUNTER — Other Ambulatory Visit: Payer: Self-pay | Admitting: Family Medicine

## 2019-08-25 DIAGNOSIS — E133299 Other specified diabetes mellitus with mild nonproliferative diabetic retinopathy without macular edema, unspecified eye: Secondary | ICD-10-CM

## 2019-08-25 DIAGNOSIS — F411 Generalized anxiety disorder: Secondary | ICD-10-CM

## 2019-09-20 ENCOUNTER — Ambulatory Visit: Payer: 59 | Admitting: Family Medicine

## 2019-09-25 ENCOUNTER — Other Ambulatory Visit: Payer: Self-pay | Admitting: General Surgery

## 2019-09-25 ENCOUNTER — Other Ambulatory Visit: Payer: Self-pay | Admitting: Family Medicine

## 2019-09-25 DIAGNOSIS — K5651 Intestinal adhesions [bands], with partial obstruction: Secondary | ICD-10-CM

## 2019-09-25 DIAGNOSIS — R1013 Epigastric pain: Secondary | ICD-10-CM

## 2019-10-11 ENCOUNTER — Ambulatory Visit
Admission: RE | Admit: 2019-10-11 | Discharge: 2019-10-11 | Disposition: A | Discharge status: Home / Self Care | Payer: 59 | Source: Ambulatory Visit | Attending: General Surgery | Admitting: General Surgery

## 2019-10-11 DIAGNOSIS — K5651 Intestinal adhesions [bands], with partial obstruction: Secondary | ICD-10-CM

## 2019-10-11 MED ORDER — IOPAMIDOL (ISOVUE-300) INJECTION 61%
125.0000 mL | Freq: Once | INTRAVENOUS | Status: AC | PRN
  Administered 2019-10-11: 125 mL via INTRAVENOUS

## 2019-10-15 ENCOUNTER — Other Ambulatory Visit: Payer: Self-pay

## 2019-10-15 ENCOUNTER — Encounter: Payer: Self-pay | Admitting: Family Medicine

## 2019-10-15 ENCOUNTER — Ambulatory Visit (INDEPENDENT_AMBULATORY_CARE_PROVIDER_SITE_OTHER): Payer: 59 | Admitting: Family Medicine

## 2019-10-15 VITALS — BP 136/84 | HR 100 | Temp 96.8°F | Resp 16 | Ht 63.0 in | Wt 255.9 lb

## 2019-10-15 DIAGNOSIS — F411 Generalized anxiety disorder: Secondary | ICD-10-CM | POA: Diagnosis not present

## 2019-10-15 DIAGNOSIS — E785 Hyperlipidemia, unspecified: Secondary | ICD-10-CM

## 2019-10-15 DIAGNOSIS — E1121 Type 2 diabetes mellitus with diabetic nephropathy: Secondary | ICD-10-CM | POA: Diagnosis not present

## 2019-10-15 DIAGNOSIS — N951 Menopausal and female climacteric states: Secondary | ICD-10-CM

## 2019-10-15 DIAGNOSIS — I129 Hypertensive chronic kidney disease with stage 1 through stage 4 chronic kidney disease, or unspecified chronic kidney disease: Secondary | ICD-10-CM

## 2019-10-15 DIAGNOSIS — E559 Vitamin D deficiency, unspecified: Secondary | ICD-10-CM

## 2019-10-15 DIAGNOSIS — IMO0001 Reserved for inherently not codable concepts without codable children: Secondary | ICD-10-CM

## 2019-10-15 DIAGNOSIS — E538 Deficiency of other specified B group vitamins: Secondary | ICD-10-CM

## 2019-10-15 DIAGNOSIS — E133299 Other specified diabetes mellitus with mild nonproliferative diabetic retinopathy without macular edema, unspecified eye: Secondary | ICD-10-CM | POA: Diagnosis not present

## 2019-10-15 DIAGNOSIS — I10 Essential (primary) hypertension: Secondary | ICD-10-CM

## 2019-10-15 DIAGNOSIS — E1122 Type 2 diabetes mellitus with diabetic chronic kidney disease: Secondary | ICD-10-CM

## 2019-10-15 LAB — POCT GLYCOSYLATED HEMOGLOBIN (HGB A1C): Hemoglobin A1C: 8.4 % — AB (ref 4.0–5.6)

## 2019-10-15 MED ORDER — TELMISARTAN-HCTZ 80-25 MG PO TABS: 1.0000 | ORAL_TABLET | Freq: Every day | ORAL | 1 refills | Status: DC

## 2019-10-15 MED ORDER — VITAMIN D (ERGOCALCIFEROL) 1.25 MG (50000 UNIT) PO CAPS: 50000.0000 [IU] | ORAL_CAPSULE | ORAL | 1 refills | Status: DC

## 2019-10-15 MED ORDER — CYANOCOBALAMIN 1000 MCG/ML IJ SOLN
1000.0000 ug | Freq: Once | INTRAMUSCULAR | Status: AC
  Administered 2020-03-04: 1000 ug via INTRAMUSCULAR

## 2019-10-15 MED ORDER — VENLAFAXINE HCL ER 75 MG PO CP24: 75.0000 mg | ORAL_CAPSULE | Freq: Two times a day (BID) | ORAL | 0 refills | Status: DC

## 2019-10-15 MED ORDER — BUSPIRONE HCL 5 MG PO TABS: 5.0000 mg | ORAL_TABLET | Freq: Two times a day (BID) | ORAL | 1 refills | Status: DC | PRN

## 2019-10-15 MED ORDER — XULTOPHY 100-3.6 UNIT-MG/ML ~~LOC~~ SOPN: 24.0000 [IU] | PEN_INJECTOR | Freq: Every day | SUBCUTANEOUS | 0 refills | Status: DC

## 2019-10-15 MED ORDER — SYNJARDY XR 25-1000 MG PO TB24: 1.0000 | ORAL_TABLET | Freq: Every day | ORAL | 1 refills | Status: DC

## 2019-10-15 MED ORDER — ATORVASTATIN CALCIUM 40 MG PO TABS: 40.0000 mg | ORAL_TABLET | Freq: Every evening | ORAL | 1 refills | Status: DC

## 2019-10-15 NOTE — Progress Notes (Signed)
Name: Wendy [name]   MRN: [id number]    DOB: [birth year]   Date:10/15/2019       Progress Note  Subjective  Chief Complaint  Chief Complaint  Patient presents with  . Diabetes  . Dyslipidemia  . Allergic Rhinitis   . Gastroesophageal Reflux  . Obesity    HPI  DM with nephropathy, retinopathy:erhgbA1C went up from 7.1% to 7.7%, 7.5%., 8.1 % ,9.7,8.2%, 7.3%7.6%,8.5% down again  7.4% but today is 8.4 %, discussed importance of titrating dose of Xultophy caused diarrhea., today she said it does not cause diarrhea. On Synjardi, no recent yeast infections. .She denies polydipsia or polyuria. She has intermittent polyphagia She has retinopathy and reminded her to keep up with eye exam  Recurrent vomiting: she was admitted to [facility] 05/2018 and had small bowel obstruction with sepsis and perforation. Dr. Lavone Neri did her exploratory laparotomy and she states since having recurrent vomiting she went back to surgeon and had further testing, no acute findings.   UXY:BFXOV shehas been compliant withTelmisartan. HCTZ, bp is at goal today, no chest pain, dizziness  or palpitation   Hyperlipidemia: sheis back on Atorvastatin andlast LDL was at goal, we will recheck it yearly , she is due for repeat today but would like to hold off until next lab drawn   Snoring: she did not go to sleep study, and she did not re-schedule it. She always feels tired. Explained importance of sleep study.She has also notices that she has to take a deep breath when sitting and watching TV, she is taking naps during the day. She states she is worried about living her son at home ( he is grown but has autism)  Discussed home sleep study on her last visit, but she did not have it done - she states she did not hear from anyone about it   Extremity obesity:weight has been stable since last visit, discussed following a diabetic diet and going for daily walks   Hot Flashes/Depression and GAD: she has been on  Effexor, no side effects of medication , she states it helps with hot flashes, she can only tolerate effexor one in am and one at lunch , but not 150 mg at once. She always worries about her son that has autism, he repeats himself, gets annoyed, but also worries what will happen to him when she dies. She sates Buspar works well for her and taking up to twice daily, she needs a refill   Herpes: she recently had a rash but not sure if it was herpes, no episodes in years .    Patient Active Problem List   Diagnosis Date Noted  . Endometrial polyp 11/29/2017  . Fibroid 11/29/2017  . Post-menopausal bleeding 11/14/2017  . Mild nonproliferative diabetic retinopathy of both eyes associated with type 2 diabetes mellitus ([place]) 06/09/2016  . Uncontrolled type 2 diabetes mellitus with nonproliferative retinopathy ([place]) 03/01/2016  . Vitamin D deficiency 07/07/2015  . Vitamin B12 deficiency 07/07/2015  . Snoring 12/25/2014  . Allergic rhinitis, seasonal 12/14/2014  . Chronic constipation 12/14/2014  . Diabetes mellitus with renal manifestation ([place]) 12/14/2014  . Dyslipidemia 12/14/2014  . Dermatitis, eczematoid 12/14/2014  . Edema extremities 12/14/2014  . Essential (primary) hypertension 12/14/2014  . Gastro-esophageal reflux disease without esophagitis 12/14/2014  . Genital herpes in women 12/14/2014  . Morbid obesity ([place]) 12/14/2014  . Female climacteric state 12/14/2014    Past Surgical History:  Procedure Laterality Date  . APPLICATION OF WOUND VAC  04/2018  .  CESAREAN SECTION  1993  . LAPAROTOMY N/A 05/14/2018   Procedure: EXPLORATORY LAPAROTOMY;  Surgeon: Georganna Skeans, MD;  Location: [place];  Service: General;  Laterality: N/A;  . LAPAROTOMY N/A 05/16/2018   Procedure: EXPLORATORY LAPAROTOMY with reanatomosis and fascia closure with wound vacum application;  Surgeon: Erroll Luna, MD;  Location: [place];  Service: General;  Laterality: N/A;  . tonsillectomy and adnoidectomy     . WISDOM TOOTH EXTRACTION      Family History  Problem Relation Age of Onset  . Diabetes Mother   . Asthma Mother   . Heart disease Mother   . Heart disease Father   . Prostate cancer Father   . Other Father        blockages in stomach  . Kidney disease Paternal Grandmother   . Asthma Son   . Autism Son   . Breast cancer Neg Hx   . Colon cancer Neg Hx   . Esophageal cancer Neg Hx   . Pancreatic cancer Neg Hx   . Rectal cancer Neg Hx   . Stomach cancer Neg Hx     Social History   Tobacco Use  . Smoking status: Former Smoker    Years: 20.00    Types: Cigarettes    Start date: 04/19/1971    Quit date: 04/2018    Years since quitting: 1.4  . Smokeless tobacco: Never Used  . Tobacco comment: she quitted and started back October 2019, but quit again 04/2018  Substance Use Topics  . Alcohol use: No    Alcohol/week: 0.0 standard drinks     Current Outpatient Medications:  .  acetaminophen (TYLENOL) 325 MG tablet, Take 2 tablets (650 mg total) by mouth every 6 (six) hours as needed for mild pain or headache., Disp: 25 tablet, Rfl: 0 .  aspirin (ASPIRIN LOW DOSE) 81 MG chewable tablet, Chew 1 tablet by mouth daily., Disp: , Rfl:  .  atorvastatin (LIPITOR) 40 MG tablet, Take 1 tablet (40 mg total) by mouth every evening., Disp: 90 tablet, Rfl: 1 .  blood glucose meter kit and supplies, Dispense based on patient and insurance preference. Use up to four times daily as directed. (FOR ICD-10 E10.9, E11.9)., Disp: 1 each, Rfl: 2 .  Blood Glucose Monitoring Suppl (ONE TOUCH ULTRA 2) w/Device KIT, 1 kit by Subdermal route See admin instructions., Disp: 1 each, Rfl: 0 .  busPIRone (BUSPAR) 5 MG tablet, TAKE 1 TABLET (5 MG TOTAL) BY MOUTH DAILY AS NEEDED., Disp: 90 tablet, Rfl: 0 .  Cholecalciferol (VITAMIN D) 2000 units CAPS, Take 1 capsule (2,000 Units total) by mouth daily., Disp: 30 capsule, Rfl: 0 .  Empagliflozin-metFORMIN HCl ER (SYNJARDY XR) 25-1000 MG TB24, Take 1 tablet by mouth  daily., Disp: 90 tablet, Rfl: 1 .  glucose blood (ONE TOUCH ULTRA TEST) test strip, CHECK FASTING BLOOD SUGARS TWICE DAILY, Disp: 100 each, Rfl: 2 .  Insulin Degludec-Liraglutide (XULTOPHY) 100-3.6 UNIT-MG/ML SOPN, Inject 22 Units into the skin daily., Disp: 15 pen, Rfl: 0 .  Insulin Pen Needle (NOVOFINE) 32G X 6 MM MISC, 1 each by Does not apply route daily., Disp: 100 each, Rfl: 2 .  ONETOUCH DELICA LANCETS FINE MISC, See admin instructions., Disp: , Rfl: 0 .  telmisartan-hydrochlorothiazide (MICARDIS HCT) 80-25 MG tablet, Take 1 tablet by mouth daily., Disp: 90 tablet, Rfl: 1 .  valACYclovir (VALTREX) 500 MG tablet, Take 1 tablet (500 mg total) by mouth 3 (three) times daily as needed (for prevention). And daily for  prevention, Disp: 100 tablet, Rfl: 1 .  venlafaxine XR (EFFEXOR-XR) 75 MG 24 hr capsule, TAKE 1 CAPSULE BY MOUTH TWICE A DAY, Disp: 180 capsule, Rfl: 0 .  fluconazole (DIFLUCAN) 150 MG tablet, Take 1 tablet (150 mg total) by mouth every other day. (Patient not taking: Reported on 10/15/2019), Disp: 3 tablet, Rfl: 0 .  fluticasone (FLONASE) 50 MCG/ACT nasal spray, Place 2 sprays into both nostrils daily. (Patient not taking: Reported on 10/15/2019), Disp: 16 g, Rfl: 0  Allergies  Allergen Reactions  . Penicillins Swelling    Did it involve swelling of the face/tongue/throat, SOB, or low BP? Yes Did it involve sudden or severe rash/hives, skin peeling, or any reaction on the inside of your mouth or nose? No Did you need to seek medical attention at a hospital or doctor's office? Yes When did it last happen? unknown If all above answers are "NO", may proceed with cephalosporin use.     I personally reviewed active problem list, medication list, allergies, family history, social history, health maintenance with the patient/caregiver today.   ROS  Constitutional: Negative for fever or weight change.  Respiratory: Negative for cough and shortness of breath.   Cardiovascular:  Negative for chest pain or palpitations.  Gastrointestinal: Negative for abdominal pain, no bowel changes.  Musculoskeletal: Negative for gait problem or joint swelling.  Skin: Negative for rash.  Neurological: Negative for dizziness or headache.  No other specific complaints in a complete review of systems (except as listed in HPI above).  Objective  Vitals:   10/15/19 1439  BP: 136/84  Pulse: 100  Resp: 16  Temp: (!) 96.8 F (36 C)  TempSrc: Temporal  SpO2: 97%  Weight: 255 lb 14.4 oz (116.1 kg)  Height: 5' 3"  (1.6 m)    Body mass index is 45.33 kg/m.  Physical Exam  Constitutional: Patient appears well-developed and well-nourished. Obese  No distress.  HEENT: head atraumatic, normocephalic, pupils equal and reactive to light,  neck supple, Cardiovascular: Normal rate, regular rhythm and normal heart sounds.  No murmur heard. No BLE edema. Pulmonary/Chest: Effort normal and breath sounds normal. No respiratory distress. Abdominal: Soft.  There is no tenderness. Psychiatric: Patient has a normal mood and affect. behavior is normal. Judgment and thought content normal.  Recent Results (from the past 2160 hour(s))  POCT HgB A1C     Status: Abnormal   Collection Time: 10/15/19  2:43 PM  Result Value Ref Range   Hemoglobin A1C 8.4 (A) 4.0 - 5.6 %   HbA1c POC (<> result, manual entry)     HbA1c, POC (prediabetic range)     HbA1c, POC (controlled diabetic range)       PHQ2/9: Depression screen [facility] 2/9 10/15/2019 06/19/2019 01/15/2019 10/15/2018 06/27/2018  Decreased Interest 0 1 0 2 3  Down, Depressed, Hopeless 0 0 0 0 0  PHQ - 2 Score 0 1 0 2 3  Altered sleeping 0 2 0 3 3  Tired, decreased energy 0 0 0 0 0  Change in appetite 0 1 0 0 0  Feeling bad or failure about yourself  0 0 0 0 0  Trouble concentrating 0 0 0 0 0  Moving slowly or fidgety/restless 0 0 0 0 0  Suicidal thoughts 0 0 0 0 0  PHQ-9 Score 0 4 0 5 6  Difficult doing work/chores - Somewhat difficult -  Somewhat difficult Not difficult at all  Some recent data might be hidden    phq 9 is  negative   Fall Risk: Fall Risk  10/15/2019 06/19/2019 10/15/2018 06/27/2018 03/27/2018  Falls in the past year? 0 0 0 0 1  Number falls in past yr: 0 0 0 0 1  Injury with Fall? 0 0 0 0 0  Risk for fall due to : - - - - History of fall(s)  Follow up - - - - Falls prevention discussed     Functional Status Survey: Is the patient deaf or have difficulty hearing?: No Does the patient have difficulty seeing, even when wearing glasses/contacts?: No Does the patient have difficulty concentrating, remembering, or making decisions?: No Does the patient have difficulty walking or climbing stairs?: No Does the patient have difficulty dressing or bathing?: No Does the patient have difficulty doing errands alone such as visiting a doctor's office or shopping?: No    Assessment & Plan  1. Type 2 diabetes with nephropathy (HCC)  - POCT HgB A1C - Insulin Degludec-Liraglutide (XULTOPHY) 100-3.6 UNIT-MG/ML SOPN; Inject 24-50 Units into the skin daily. Go up by 2 units every three day to keep fasting between 120-140 - goal is 50 units per day  Dispense: 15 pen; Refill: 0  2. GAD (generalized anxiety disorder)  - venlafaxine XR (EFFEXOR-XR) 75 MG 24 hr capsule; Take 1 capsule (75 mg total) by mouth 2 (two) times daily.  Dispense: 180 capsule; Refill: 0 - busPIRone (BUSPAR) 5 MG tablet; Take 1 tablet (5 mg total) by mouth 2 (two) times daily as needed.  Dispense: 180 tablet; Refill: 1  3. Female climacteric state  - venlafaxine XR (EFFEXOR-XR) 75 MG 24 hr capsule; Take 1 capsule (75 mg total) by mouth 2 (two) times daily.  Dispense: 180 capsule; Refill: 0  4. Nonproliferative retinopathy due to secondary diabetes mellitus (HCC)  - Empagliflozin-metFORMIN HCl ER (SYNJARDY XR) 25-1000 MG TB24; Take 1 tablet by mouth daily.  Dispense: 90 tablet; Refill: 1  5. Essential (primary) hypertension  -  telmisartan-hydrochlorothiazide (MICARDIS HCT) 80-25 MG tablet; Take 1 tablet by mouth daily.  Dispense: 90 tablet; Refill: 1  6. Dyslipidemia  - atorvastatin (LIPITOR) 40 MG tablet; Take 1 tablet (40 mg total) by mouth every evening.  Dispense: 90 tablet; Refill: 1  7. Type 2 diabetes mellitus with chronic kidney disease and hypertension (HCC)  - Insulin Degludec-Liraglutide (XULTOPHY) 100-3.6 UNIT-MG/ML SOPN; Inject 24-50 Units into the skin daily. Go up by 2 units every three day to keep fasting between 120-140 - goal is 50 units per day  Dispense: 15 pen; Refill: 0  8. Vitamin B12 deficiency  - cyanocobalamin ((VITAMIN B-12)) injection 1,000 mcg  9. Vitamin D deficiency  - Vitamin D, Ergocalciferol, (DRISDOL) 1.25 MG (50000 UNIT) CAPS capsule; Take 1 capsule (50,000 Units total) by mouth every 7 (seven) days.  Dispense: 12 capsule; Refill: 1

## 2020-02-05 ENCOUNTER — Telehealth: Payer: Self-pay

## 2020-02-05 NOTE — Telephone Encounter (Signed)
Copied from [place] [phone]. Topic: General - Other >> Feb 05, 2020  2:25 PM Leward Quan A wrote: Reason for CRM: Patient called to inquire of Dr Ancil Boozer can please change her  Insulin Degludec-Liraglutide (XULTOPHY) 100-3.6 UNIT-MG/ML SOPN  and send something in that her insurance will pay for that is not so expensive. Any questions please call patient at Ph# [phone]

## 2020-02-07 NOTE — Telephone Encounter (Signed)
Per Dr. Ancil Boozer patient we can give patient samples of [place] 100/33 units.  Her dosing instructions would be the same as taking the Xultophy, which is Inject 24-50 Units into the skin daily. Go up by 2 units every three day to keep fasting between 120-140 - goal is 50 units per day.  Patient verbalized understanding and will be in today to pick up the 7 sample boxes.

## 2020-02-28 ENCOUNTER — Other Ambulatory Visit: Payer: Self-pay | Admitting: Family Medicine

## 2020-02-28 DIAGNOSIS — F411 Generalized anxiety disorder: Secondary | ICD-10-CM

## 2020-02-28 DIAGNOSIS — N951 Menopausal and female climacteric states: Secondary | ICD-10-CM

## 2020-03-03 NOTE — Progress Notes (Signed)
Name: Wendy [name]   MRN: [id number]    DOB: May 15, 1960   Date:03/04/2020       Progress Note  Subjective  Chief Complaint  Follow up  HPI   DM with nephropathy, retinopathy:erhgbA1C went up from 7.1% to 7.7%, 7.5%., 8.1 % ,9.7,8.2%, 7.3%7.6%,8.5% down again  7.4% , 8.4 % and now 7.7 %, explained importance of stopping drinking regular sodas. She is using Xultophy at 30 units she states the cost is too high even with insurance, advised her to contact insurance and see what would be cheaper - maybe [place] or split Xultophy into [place] and Rybelsus . She is also on  Synjardi.She denies polydipsia or polyuria. She has intermittent polyphagia She has retinopathy and reminded her to keep up with eye exam  Recurrent vomiting: she was admitted to [facility] 05/2018 and had small bowel obstruction with sepsis and perforation. Dr. Lavone Neri did her exploratory laparotomy , symptoms not present since her last visit with me   DGL:OVFIE shehas been compliant withTelmisartan. HCTZ, bp is borderline today, no chest pain, dizziness  or palpitation   Hyperlipidemia: sheis back on Atorvastatin andlast LDL was at goal, we will recheck it yearly, last labs   Snoring: she did not go to sleep study, and she did not re-schedule it. She always feels tired. Explained importance of sleep study.She has also notices that she has to take a deep breath when sitting and watching TV, she is taking naps during the day. She states she is worried about living her son at home ( he is grown but has autism)  Discussed home sleep study on her last visit, but she did not have it done - she states she did not hear from anyone about it   Extremity obesity:weight has been stable since last visit, she needs to stop drinking sodas   Hot Flashes/Depression and GAD: she has been on Effexor, no side effects of medication , she states it helps with hot flashes, she can only tolerate effexor one in am and one at lunch , but  not 150 mg at once. She always worries about her son that has autism, he repeats himself, gets annoyed, but also worries what will happen to him when she dies. She sates Buspar works well for her and taking up to twice daily, she states sometimes she feels very down and sleeps for a few days in a row, discussed importance of seeing a psychiatrist. She also has panic attacks and agoraphobia.   Herpes: she recently had a rash but not sure if it was herpes, no episodes in years .She would like a refill today   Numbness: she states her entire left side of her body has been numb for the past couple of months, intermittent, sometimes has left side headache but no numbness above her neck   Patient Active Problem List   Diagnosis Date Noted  . Endometrial polyp 11/29/2017  . Fibroid 11/29/2017  . Post-menopausal bleeding 11/14/2017  . Mild nonproliferative diabetic retinopathy of both eyes associated with type 2 diabetes mellitus ([place]) 06/09/2016  . Uncontrolled type 2 diabetes mellitus with nonproliferative retinopathy ([place]) 03/01/2016  . Vitamin D deficiency 07/07/2015  . Vitamin B12 deficiency 07/07/2015  . Snoring 12/25/2014  . Allergic rhinitis, seasonal 12/14/2014  . Chronic constipation 12/14/2014  . Diabetes mellitus with renal manifestation ([place]) 12/14/2014  . Dyslipidemia 12/14/2014  . Dermatitis, eczematoid 12/14/2014  . Edema extremities 12/14/2014  . Essential (primary) hypertension 12/14/2014  . Gastro-esophageal reflux disease without esophagitis  12/14/2014  . Genital herpes in women 12/14/2014  . Morbid obesity ([place]) 12/14/2014  . Female climacteric state 12/14/2014    Past Surgical History:  Procedure Laterality Date  . APPLICATION OF WOUND VAC  04/2018  . CESAREAN SECTION  1993  . LAPAROTOMY N/A 05/14/2018   Procedure: EXPLORATORY LAPAROTOMY;  Surgeon: Georganna Skeans, MD;  Location: [place];  Service: General;  Laterality: N/A;  . LAPAROTOMY N/A 05/16/2018   Procedure:  EXPLORATORY LAPAROTOMY with reanatomosis and fascia closure with wound vacum application;  Surgeon: Erroll Luna, MD;  Location: [place];  Service: General;  Laterality: N/A;  . tonsillectomy and adnoidectomy    . WISDOM TOOTH EXTRACTION      Family History  Problem Relation Age of Onset  . Diabetes Mother   . Asthma Mother   . Heart disease Mother   . Heart disease Father   . Prostate cancer Father   . Other Father        blockages in stomach  . Kidney disease Paternal Grandmother   . Asthma Son   . Autism Son   . Breast cancer Neg Hx   . Colon cancer Neg Hx   . Esophageal cancer Neg Hx   . Pancreatic cancer Neg Hx   . Rectal cancer Neg Hx   . Stomach cancer Neg Hx     Social History   Tobacco Use  . Smoking status: Former Smoker    Years: 20.00    Types: Cigarettes    Start date: 04/19/1971    Quit date: 04/2018    Years since quitting: 1.8  . Smokeless tobacco: Never Used  . Tobacco comment: she quitted and started back October 2019, but quit again 04/2018  Substance Use Topics  . Alcohol use: No    Alcohol/week: 0.0 standard drinks     Current Outpatient Medications:  .  acetaminophen (TYLENOL) 325 MG tablet, Take 2 tablets (650 mg total) by mouth every 6 (six) hours as needed for mild pain or headache., Disp: 25 tablet, Rfl: 0 .  aspirin (ASPIRIN LOW DOSE) 81 MG chewable tablet, Chew 1 tablet by mouth daily., Disp: , Rfl:  .  atorvastatin (LIPITOR) 40 MG tablet, Take 1 tablet (40 mg total) by mouth every evening., Disp: 90 tablet, Rfl: 1 .  blood glucose meter kit and supplies, Dispense based on patient and insurance preference. Use up to four times daily as directed. (FOR ICD-10 E10.9, E11.9)., Disp: 1 each, Rfl: 2 .  Blood Glucose Monitoring Suppl (ONE TOUCH ULTRA 2) w/Device KIT, 1 kit by Subdermal route See admin instructions., Disp: 1 each, Rfl: 0 .  busPIRone (BUSPAR) 5 MG tablet, Take 1 tablet (5 mg total) by mouth 2 (two) times daily as needed., Disp: 180  tablet, Rfl: 1 .  Cholecalciferol (VITAMIN D) 2000 units CAPS, Take 1 capsule (2,000 Units total) by mouth daily., Disp: 30 capsule, Rfl: 0 .  Empagliflozin-metFORMIN HCl ER (SYNJARDY XR) 25-1000 MG TB24, Take 1 tablet by mouth daily., Disp: 90 tablet, Rfl: 1 .  glucose blood (ONE TOUCH ULTRA TEST) test strip, CHECK FASTING BLOOD SUGARS TWICE DAILY, Disp: 100 each, Rfl: 2 .  Insulin Degludec-Liraglutide (XULTOPHY) 100-3.6 UNIT-MG/ML SOPN, Inject 24-50 Units into the skin daily. Go up by 2 units every three day to keep fasting between 120-140 - goal is 50 units per day, Disp: 15 pen, Rfl: 0 .  Insulin Pen Needle (NOVOFINE) 32G X 6 MM MISC, 1 each by Does not apply route daily., Disp:  100 each, Rfl: 2 .  ONETOUCH DELICA LANCETS FINE MISC, See admin instructions., Disp: , Rfl: 0 .  telmisartan-hydrochlorothiazide (MICARDIS HCT) 80-25 MG tablet, Take 1 tablet by mouth daily., Disp: 90 tablet, Rfl: 1 .  valACYclovir (VALTREX) 500 MG tablet, Take 1 tablet (500 mg total) by mouth 3 (three) times daily as needed (for prevention). And daily for prevention, Disp: 100 tablet, Rfl: 1 .  venlafaxine XR (EFFEXOR-XR) 75 MG 24 hr capsule, TAKE 1 CAPSULE BY MOUTH TWICE A DAY, Disp: 180 capsule, Rfl: 0 .  Vitamin D, Ergocalciferol, (DRISDOL) 1.25 MG (50000 UNIT) CAPS capsule, Take 1 capsule (50,000 Units total) by mouth every 7 (seven) days., Disp: 12 capsule, Rfl: 1  Allergies  Allergen Reactions  . Penicillins Swelling    Did it involve swelling of the face/tongue/throat, SOB, or low BP? Yes Did it involve sudden or severe rash/hives, skin peeling, or any reaction on the inside of your mouth or nose? No Did you need to seek medical attention at a hospital or doctor's office? Yes When did it last happen? unknown If all above answers are "NO", may proceed with cephalosporin use.     I personally reviewed active problem list, medication list, allergies, family history, social history, health maintenance with the  patient/caregiver today.   ROS  Constitutional: Negative for fever or weight change.  Respiratory: Negative for cough and shortness of breath.   Cardiovascular: Negative for chest pain or palpitations.  Gastrointestinal: Negative for abdominal pain, no bowel changes.  Musculoskeletal: Negative for gait problem or joint swelling.  Skin: Negative for rash.  Neurological: Negative for dizziness or headache.  No other specific complaints in a complete review of systems (except as listed in HPI above).  Objective  Vitals:   03/04/20 1530  BP: (!) 144/90  Pulse: 90  Resp: 18  Temp: 98.1 F (36.7 C)  TempSrc: Oral  SpO2: 98%  Weight: 250 lb 1.6 oz (113.4 kg)  Height: 5' 3"  (1.6 m)    Body mass index is 44.3 kg/m.  Physical Exam  Constitutional: Patient appears well-developed and well-nourished. Obese No distress.  HEENT: head atraumatic, normocephalic, pupils equal and reactive to light, neck supple Cardiovascular: Normal rate, regular rhythm and normal heart sounds.  No murmur heard. No BLE edema. Pulmonary/Chest: Effort normal and breath sounds normal. No respiratory distress. Abdominal: Soft.  There is no tenderness. Neurological: slow gait, but normal balance, normal grip and strength, but has decrease in sensation on left side of body, normal rom of neck without pain, cranial nerves intact Psychiatric: Patient has a normal mood and affect. behavior is normal. Judgment and thought content normal.   Recent Results (from the past 2160 hour(s))  POCT HgB A1C     Status: Abnormal   Collection Time: 03/04/20  3:29 PM  Result Value Ref Range   Hemoglobin A1C 7.7 (A) 4.0 - 5.6 %   HbA1c POC (<> result, manual entry)     HbA1c, POC (prediabetic range)     HbA1c, POC (controlled diabetic range)        PHQ2/9: Depression screen [facility] 2/9 03/04/2020 03/04/2020 10/15/2019 06/19/2019 01/15/2019  Decreased Interest 3 3 0 1 0  Down, Depressed, Hopeless 3 3 0 0 0  PHQ - 2 Score 6 6  0 1 0  Altered sleeping 3 - 0 2 0  Tired, decreased energy 3 - 0 0 0  Change in appetite 0 - 0 1 0  Feeling bad or failure about yourself  0 - 0 0 0  Trouble concentrating 0 - 0 0 0  Moving slowly or fidgety/restless 0 - 0 0 0  Suicidal thoughts 0 - 0 0 0  PHQ-9 Score 12 - 0 4 0  Difficult doing work/chores Very difficult - - Somewhat difficult -  Some recent data might be hidden    phq 9 is positive  Fall Risk: Fall Risk  03/04/2020 10/15/2019 06/19/2019 10/15/2018 06/27/2018  Falls in the past year? 1 0 0 0 0  Number falls in past yr: 1 0 0 0 0  Injury with Fall? 0 0 0 0 0  Risk for fall due to : History of fall(s) - - - -  Follow up - - - - -      Assessment & Plan  1. Type 2 diabetes with nephropathy (HCC)  - POCT HgB A1C  2. Type 2 diabetes mellitus with chronic kidney disease and hypertension (HCC)  - Microalbumin / creatinine urine ratio  3. Vitamin B12 deficiency  - Vitamin B12  4. Morbid obesity ([place])  Discussed with the patient the risk posed by an increased BMI. Discussed importance of portion control, calorie counting and at least 150 minutes of physical activity weekly. Avoid sweet beverages and drink more water. Eat at least 6 servings of fruit and vegetables daily   5. Vitamin D deficiency   6. Essential (primary) hypertension  - COMPLETE METABOLIC PANEL WITH GFR - CBC with Differential/Platelet - telmisartan-hydrochlorothiazide (MICARDIS HCT) 80-25 MG tablet; Take 1 tablet by mouth daily.  Dispense: 90 tablet; Refill: 1  7. GAD (generalized anxiety disorder)  - Ambulatory referral to Psychiatry  8. Dyslipidemia  - Lipid panel  9. Nonproliferative retinopathy due to secondary diabetes mellitus (HCC)  - Empagliflozin-metFORMIN HCl ER (SYNJARDY XR) 25-1000 MG TB24; Take 1 tablet by mouth daily.  Dispense: 90 tablet; Refill: 1  10. Herpes simplex vulvovaginitis  - valACYclovir (VALTREX) 500 MG tablet; Take 1 tablet (500 mg total) by mouth 3  (three) times daily as needed (for prevention). And daily for prevention  Dispense: 100 tablet; Refill: 1  11. Paresthesia  - MR Brain W Wo Contrast; Future  12. Dysthymia  - Ambulatory referral to Psychiatry

## 2020-03-04 ENCOUNTER — Other Ambulatory Visit: Payer: Self-pay

## 2020-03-04 ENCOUNTER — Ambulatory Visit (INDEPENDENT_AMBULATORY_CARE_PROVIDER_SITE_OTHER): Payer: 59 | Admitting: Family Medicine

## 2020-03-04 ENCOUNTER — Telehealth: Payer: Self-pay

## 2020-03-04 ENCOUNTER — Encounter: Payer: Self-pay | Admitting: Family Medicine

## 2020-03-04 VITALS — BP 144/90 | HR 90 | Temp 98.1°F | Resp 18 | Ht 63.0 in | Wt 250.1 lb

## 2020-03-04 DIAGNOSIS — E559 Vitamin D deficiency, unspecified: Secondary | ICD-10-CM

## 2020-03-04 DIAGNOSIS — I10 Essential (primary) hypertension: Secondary | ICD-10-CM

## 2020-03-04 DIAGNOSIS — E538 Deficiency of other specified B group vitamins: Secondary | ICD-10-CM | POA: Diagnosis not present

## 2020-03-04 DIAGNOSIS — E1122 Type 2 diabetes mellitus with diabetic chronic kidney disease: Secondary | ICD-10-CM

## 2020-03-04 DIAGNOSIS — E1121 Type 2 diabetes mellitus with diabetic nephropathy: Secondary | ICD-10-CM | POA: Diagnosis not present

## 2020-03-04 DIAGNOSIS — E133299 Other specified diabetes mellitus with mild nonproliferative diabetic retinopathy without macular edema, unspecified eye: Secondary | ICD-10-CM

## 2020-03-04 DIAGNOSIS — A6004 Herpesviral vulvovaginitis: Secondary | ICD-10-CM

## 2020-03-04 DIAGNOSIS — I129 Hypertensive chronic kidney disease with stage 1 through stage 4 chronic kidney disease, or unspecified chronic kidney disease: Secondary | ICD-10-CM

## 2020-03-04 DIAGNOSIS — IMO0001 Reserved for inherently not codable concepts without codable children: Secondary | ICD-10-CM

## 2020-03-04 DIAGNOSIS — F411 Generalized anxiety disorder: Secondary | ICD-10-CM

## 2020-03-04 DIAGNOSIS — F341 Dysthymic disorder: Secondary | ICD-10-CM

## 2020-03-04 DIAGNOSIS — E785 Hyperlipidemia, unspecified: Secondary | ICD-10-CM

## 2020-03-04 DIAGNOSIS — R202 Paresthesia of skin: Secondary | ICD-10-CM

## 2020-03-04 LAB — POCT GLYCOSYLATED HEMOGLOBIN (HGB A1C): Hemoglobin A1C: 7.7 % — AB (ref 4.0–5.6)

## 2020-03-04 MED ORDER — SYNJARDY XR 25-1000 MG PO TB24: 1.0000 | ORAL_TABLET | Freq: Every day | ORAL | 1 refills | Status: DC

## 2020-03-04 MED ORDER — TELMISARTAN-HCTZ 80-25 MG PO TABS: 1.0000 | ORAL_TABLET | Freq: Every day | ORAL | 1 refills | Status: DC

## 2020-03-04 MED ORDER — VALACYCLOVIR HCL 500 MG PO TABS: 500.0000 mg | ORAL_TABLET | Freq: Three times a day (TID) | ORAL | 1 refills | Status: DC | PRN

## 2020-03-04 MED ORDER — VITAMIN D (ERGOCALCIFEROL) 1.25 MG (50000 UNIT) PO CAPS: 50000.0000 [IU] | ORAL_CAPSULE | ORAL | 1 refills | Status: DC

## 2020-03-04 NOTE — Telephone Encounter (Signed)
I called this patient to inform her that she has been scheduled to have her MRI on Monday, March 09, 2020 @ 12pm at [facility], but there was no answer.  A message was left for the patient with this information on there and asking her to arrive 30 mins early to be screened and registered.  A CRM will be placed

## 2020-03-04 NOTE — Patient Instructions (Signed)
Check if cheaper to switch to [place] We can also try [place] and [employer] or [place] and [employer]

## 2020-03-05 ENCOUNTER — Other Ambulatory Visit: Payer: Self-pay | Admitting: Family Medicine

## 2020-03-05 ENCOUNTER — Telehealth: Payer: Self-pay

## 2020-03-05 DIAGNOSIS — Z1231 Encounter for screening mammogram for malignant neoplasm of breast: Secondary | ICD-10-CM

## 2020-03-05 LAB — COMPLETE METABOLIC PANEL WITH GFR
AG Ratio: 1.9 (calc) (ref 1.0–2.5)
ALT: 19 U/L (ref 6–29)
AST: 18 U/L (ref 10–35)
Albumin: 4.3 g/dL (ref 3.6–5.1)
Alkaline phosphatase (APISO): 96 U/L (ref 37–153)
BUN: 16 mg/dL (ref 7–25)
CO2: 30 mmol/L (ref 20–32)
Calcium: 9.7 mg/dL (ref 8.6–10.4)
Chloride: 103 mmol/L (ref 98–110)
Creat: 0.82 mg/dL (ref 0.50–1.05)
GFR, Est African American: 91 mL/min/{1.73_m2} (ref >=60)
GFR, Est Non African American: 78 mL/min/{1.73_m2} (ref >=60)
Globulin: 2.3 g/dL (calc) (ref 1.9–3.7)
Glucose, Bld: 106 mg/dL — ABNORMAL HIGH (ref 65–99)
Potassium: 3.9 mmol/L (ref 3.5–5.3)
Sodium: 142 mmol/L (ref 135–146)
Total Bilirubin: 0.4 mg/dL (ref 0.2–1.2)
Total Protein: 6.6 g/dL (ref 6.1–8.1)

## 2020-03-05 LAB — CBC WITH DIFFERENTIAL/PLATELET
Absolute Monocytes: 449 cells/uL (ref 200–950)
Basophils Absolute: 26 cells/uL (ref 0–200)
Basophils Relative: 0.3 %
Eosinophils Absolute: 220 cells/uL (ref 15–500)
Eosinophils Relative: 2.5 %
HCT: 43.5 % (ref 35.0–45.0)
Hemoglobin: 14.4 g/dL (ref 11.7–15.5)
Lymphs Abs: 2675 cells/uL (ref 850–3900)
MCH: 28.9 pg (ref 27.0–33.0)
MCHC: 33.1 g/dL (ref 32.0–36.0)
MCV: 87.3 fL (ref 80.0–100.0)
MPV: 11.1 fL (ref 7.5–12.5)
Monocytes Relative: 5.1 %
Neutro Abs: 5430 cells/uL (ref 1500–7800)
Neutrophils Relative %: 61.7 %
Platelets: 269 10*3/uL (ref 140–400)
RBC: 4.98 10*6/uL (ref 3.80–5.10)
RDW: 13.4 % (ref 11.0–15.0)
Total Lymphocyte: 30.4 %
WBC: 8.8 10*3/uL (ref 3.8–10.8)

## 2020-03-05 LAB — MICROALBUMIN / CREATININE URINE RATIO
Creatinine, Urine: 44 mg/dL (ref 20–275)
Microalb Creat Ratio: 11 mcg/mg creat (ref <30)
Microalb, Ur: 0.5 mg/dL

## 2020-03-05 LAB — LIPID PANEL
Cholesterol: 142 mg/dL (ref <200)
HDL: 47 mg/dL — ABNORMAL LOW (ref >=50)
LDL Cholesterol (Calc): 74 mg/dL (calc)
Non-HDL Cholesterol (Calc): 95 mg/dL (calc) (ref <130)
Total CHOL/HDL Ratio: 3 (calc) (ref <5.0)
Triglycerides: 119 mg/dL (ref <150)

## 2020-03-05 LAB — VITAMIN B12: Vitamin B-12: 352 pg/mL (ref 200–1100)

## 2020-03-05 NOTE — Telephone Encounter (Signed)
Copied from [place] [id number]. Topic: Referral - Status >> Mar 05, 2020  3:24 PM Yvette Rack wrote: Reason for CRM: Pt stated the Mental Health office in [place] was not able to see her so she would like to request a referral to another location.

## 2020-03-06 NOTE — Telephone Encounter (Signed)
I think we are good. I have switched it to another office.

## 2020-03-09 ENCOUNTER — Ambulatory Visit (HOSPITAL_COMMUNITY): Admission: RE | Admit: 2020-03-09 | Payer: 59 | Source: Ambulatory Visit

## 2020-03-17 ENCOUNTER — Other Ambulatory Visit: Payer: Self-pay | Admitting: Family Medicine

## 2020-03-17 DIAGNOSIS — A6004 Herpesviral vulvovaginitis: Secondary | ICD-10-CM

## 2020-03-17 NOTE — Telephone Encounter (Signed)
   Notes to clinic:  Patient requesting a 90 day supply Review for change    Requested Prescriptions  Pending Prescriptions Disp Refills   valACYclovir (VALTREX) 500 MG tablet [Pharmacy Med Name: VALACYCLOVIR HCL 500 MG TABLET] 270 tablet 1    Sig: TAKE 1 TABLET (500 MG TOTAL) BY MOUTH 3 (THREE) TIMES DAILY AS NEEDED FOR DAILY PREVENTION      Antimicrobials:  Antiviral Agents - Anti-Herpetic Passed - 03/17/2020 11:07 AM      Passed - Valid encounter within last 12 months    Recent Outpatient Visits           1 week ago Type 2 diabetes with nephropathy [facility])   [facility] [place], Drue Stager, MD   5 months ago Type 2 diabetes with nephropathy [facility])   [facility] [place], Drue Stager, MD   9 months ago Type 2 diabetes with nephropathy [facility])   [facility] Steele Sizer, MD   1 year ago Morbid obesity [facility]   [facility] Steele Sizer, MD   1 year ago Type 2 diabetes with nephropathy [facility])   [facility] Steele Sizer, MD       Future Appointments             In 2 months Ancil Boozer, Drue Stager, MD [facility]

## 2020-03-22 IMAGING — CT CT RENAL STONE PROTOCOL
2 of 4 series · 16 of 46 positions shown, 18 images · non-contrast
Comparison: None.

CLINICAL DATA: 57 y/o F; nausea, vomiting, and abdominal pain for 3
days.

EXAM:
CT ABDOMEN AND PELVIS WITHOUT CONTRAST
TECHNIQUE: Multidetector CT imaging of the abdomen and pelvis was performed
following the standard protocol without IV contrast.

[Series 3: stone study 5.0 i30f 2 · axial · 0.98mm/px · z∈[+866,+1326]mm · 13 of 100 slices shown, 15 images]
[im 4/100  soft-tissue]
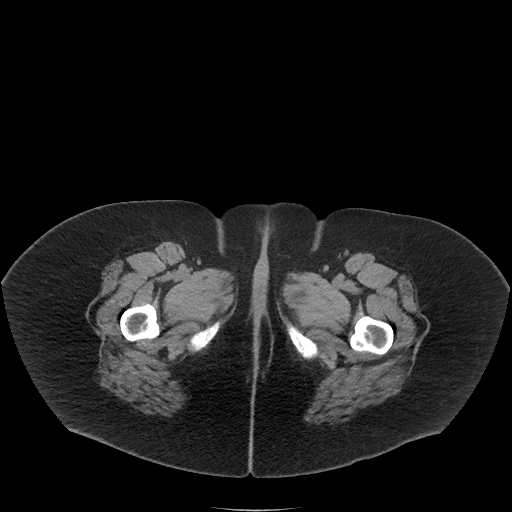
[im 4/100  bone]
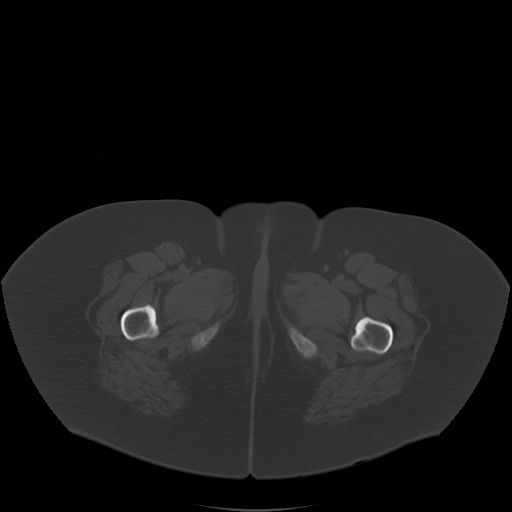
[im 12/100  soft-tissue]
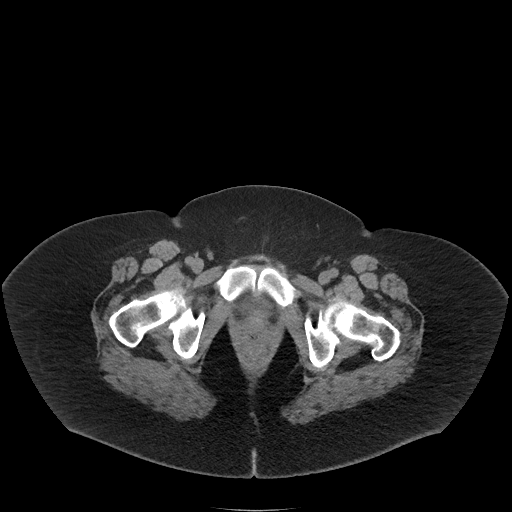
[im 20/100  soft-tissue]
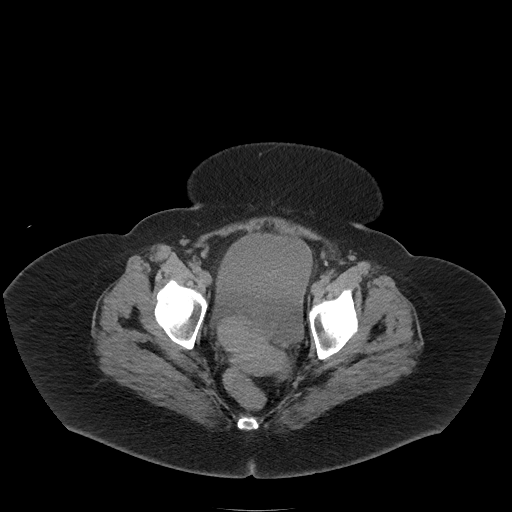
[im 28/100  soft-tissue]
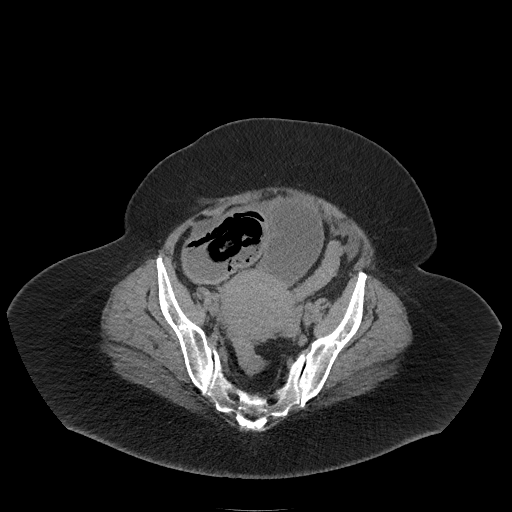
[im 36/100  soft-tissue]
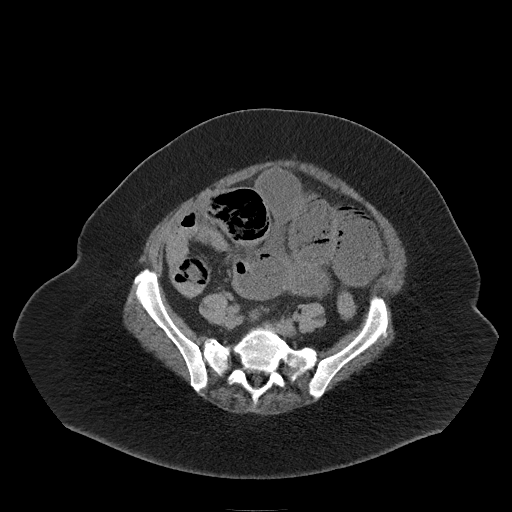
[im 44/100  soft-tissue]
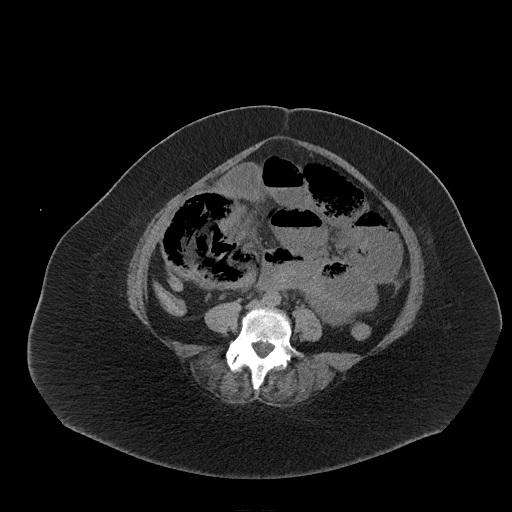
[im 52/100  soft-tissue]
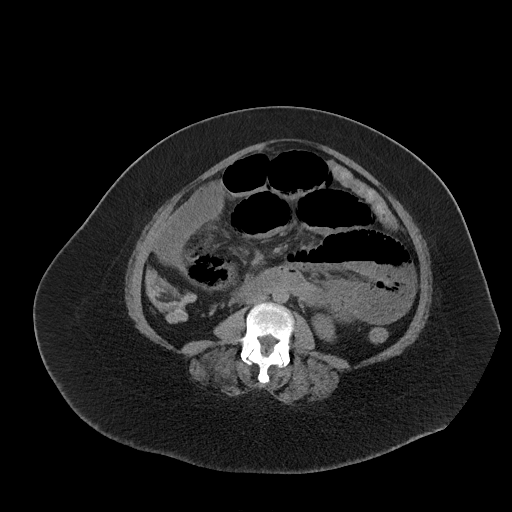
[im 56/100  soft-tissue]
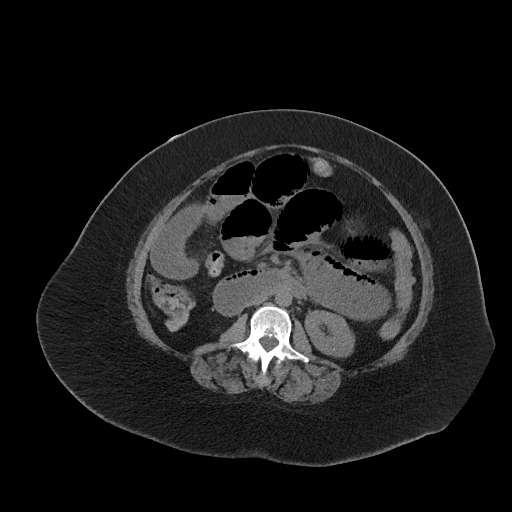
[im 64/100  soft-tissue]
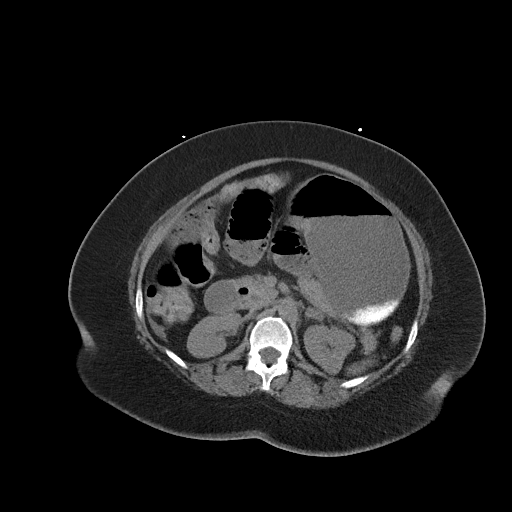
[im 64/100  bone]
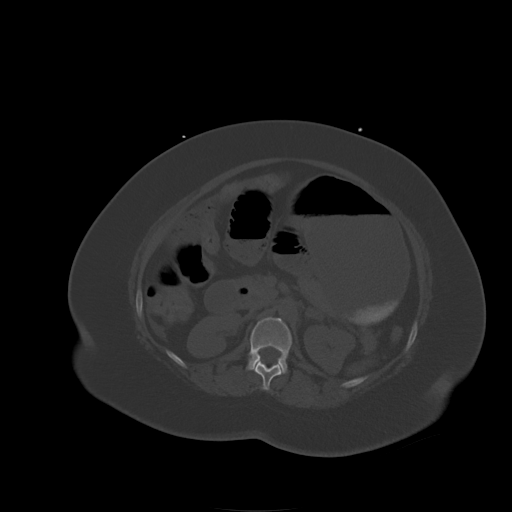
[im 72/100  soft-tissue]
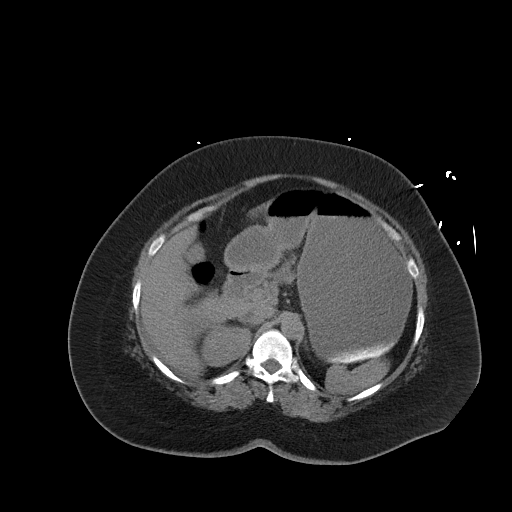
[im 80/100  soft-tissue]
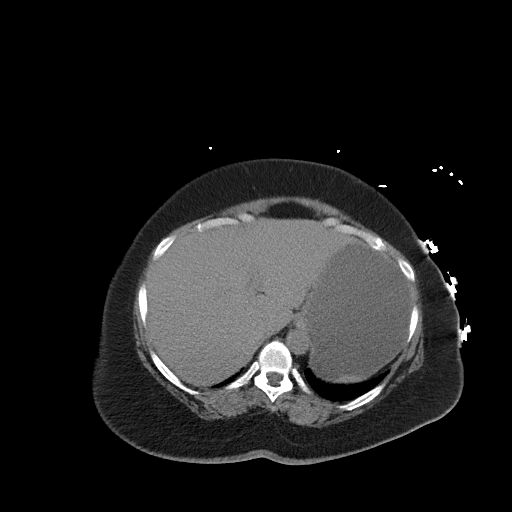
[im 88/100  soft-tissue]
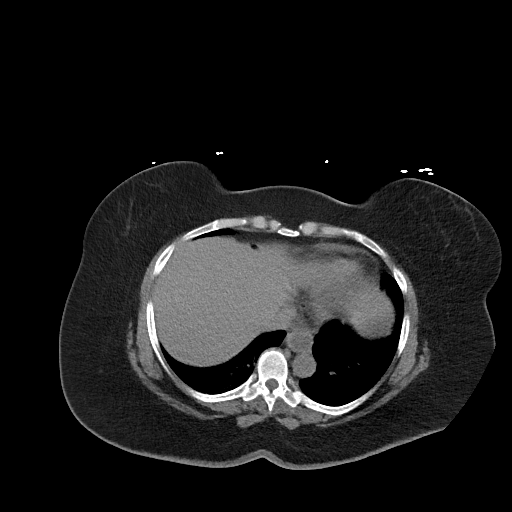
[im 96/100  soft-tissue]
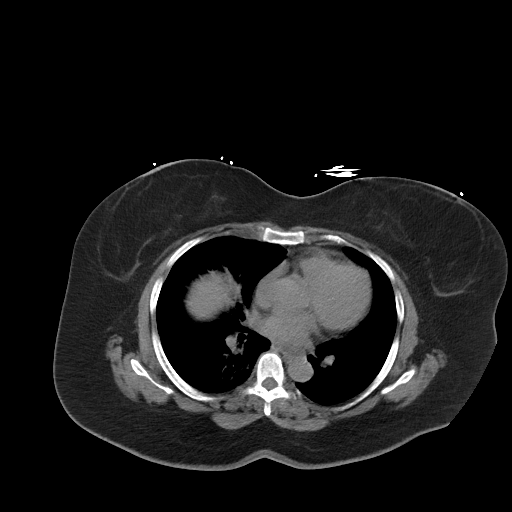

[Series 6: coronal soft tissue · coronal · 0.89mm/px · 3 of 107 slices shown]
[im 36/107  soft-tissue]
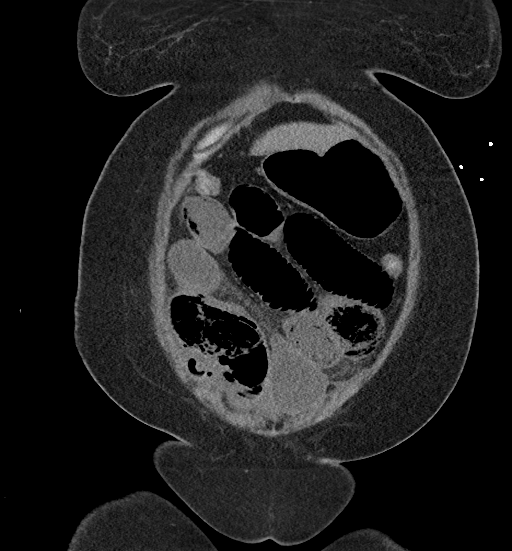
[im 48/107  soft-tissue]
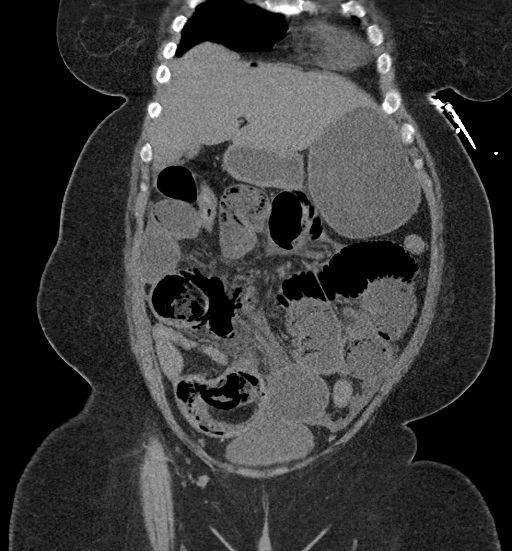
[im 59/107  soft-tissue]
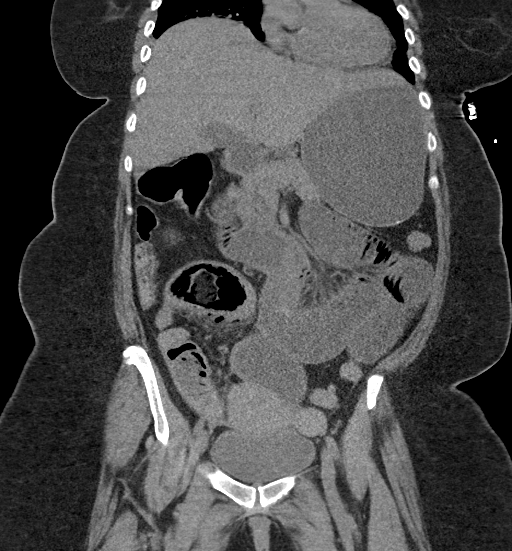

[16 of 46 positions shown; findings below may reference images not displayed]

FINDINGS: Lower chest: Platelike atelectasis in the lung bases.

Hepatobiliary: No focal liver abnormality is seen. No gallstones,
gallbladder wall thickening, or biliary dilatation.

Pancreas: Unremarkable. No pancreatic ductal dilatation or
surrounding inflammatory changes.

Spleen: Normal in size without focal abnormality.

Adrenals/Urinary Tract: Adrenal glands are unremarkable. Kidneys are
normal, without renal calculi, focal lesion, or hydronephrosis.
Bladder is unremarkable.

Stomach/Bowel: Severe diffuse small-bowel obstruction with
transition at the level of the distal ileum in the right lower
quadrant (series 3, image 65). Mild sigmoid diverticulosis without
findings of acute diverticulitis. Otherwise normal appearance of the
colon.

Vascular/Lymphatic: Aortic atherosclerosis. No enlarged abdominal or
pelvic lymph nodes.

Reproductive: Negative.

Other: There are a few tiny foci of pneumoperitoneum at the liver
hilum, anterior abdominal wall, and below the diaphragm (series 3,
image 12, 24, 27). Small volume of ascites.

Musculoskeletal: No fracture is seen. Lumbar spondylosis with
prominent facet arthropathy.
IMPRESSION: 1. Severe diffuse small bowel obstruction with transition level of
distal ileum in right lower quadrant.
2. Few punctate foci of pneumoperitoneum indicating small/micro
perforation. Small volume of ascites.

These results were called by telephone at the time of interpretation
on 05/14/2018 at [DATE] to Dr. Daubaraite, who verbally acknowledged
these results.

## 2020-03-27 ENCOUNTER — Telehealth: Payer: Self-pay | Admitting: *Deleted

## 2020-03-27 NOTE — Chronic Care Management (AMB) (Signed)
  Care Management   Note  03/27/2020 Name: [name] MRN: [id number] DOB: [birth year]  Wendy [name] is a 59 y.o. year old female who is a primary care patient of Steele Sizer, MD and is actively engaged with the care management team. I reached out to Bailey Mech by phone today to assist with scheduling a follow up visit with the RN Case Manager  Follow up plan: Patient declines further follow up and engagement by the care management team. Appropriate care team members and provider have been notified via electronic communication. The care management team is available to follow up with the patient after provider conversation with the patient regarding recommendation for care management engagement and subsequent re-referral to the care management team.   [place] Management

## 2020-04-20 ENCOUNTER — Other Ambulatory Visit: Payer: Self-pay | Admitting: Family Medicine

## 2020-04-20 DIAGNOSIS — E559 Vitamin D deficiency, unspecified: Secondary | ICD-10-CM

## 2020-04-23 ENCOUNTER — Other Ambulatory Visit: Payer: Self-pay

## 2020-04-23 ENCOUNTER — Ambulatory Visit
Admission: RE | Admit: 2020-04-23 | Discharge: 2020-04-23 | Disposition: A | Discharge status: Home / Self Care | Payer: 59 | Source: Ambulatory Visit | Attending: Family Medicine | Admitting: Family Medicine

## 2020-04-23 DIAGNOSIS — Z1231 Encounter for screening mammogram for malignant neoplasm of breast: Secondary | ICD-10-CM

## 2020-04-26 ENCOUNTER — Other Ambulatory Visit: Payer: Self-pay | Admitting: Family Medicine

## 2020-04-26 DIAGNOSIS — F411 Generalized anxiety disorder: Secondary | ICD-10-CM

## 2020-05-01 ENCOUNTER — Telehealth: Payer: Self-pay

## 2020-05-01 NOTE — Telephone Encounter (Signed)
Copied from [place] [id number]. Topic: General - Other >> May 01, 2020  2:33 PM Rainey Pines A wrote: Patient stated that she was advised during last appointment that someone would contact her to schedule an appt fro neurology. Patient hasnt heard from anyone and would like a callback once referral has been placed. Please advise

## 2020-05-01 NOTE — Telephone Encounter (Signed)
I do not see where a referral to neurology was ever placed.  Please advise.

## 2020-05-04 NOTE — Telephone Encounter (Signed)
Please explain to patient - I believe there was a misunderstanding or a miscommunication regarding this situation.

## 2020-06-03 NOTE — Progress Notes (Deleted)
Name: Wendy [name]   MRN: [id number]    DOB: June 25, 1960   Date:06/03/2020       Progress Note  Subjective  Chief Complaint  Follow up   HPI DM with nephropathy, retinopathy:erhgbA1C went up from 7.1% to 7.7%, 7.5%., 8.1 % ,9.7,8.2%, 7.3%7.6%,8.5% down again  7.4% , 8.4 % and now 7.7 %, explained importance of stopping drinking regular sodas. She is using Xultophy at 30 units she states the cost is too high even with insurance, advised her to contact insurance and see what would be cheaper - maybe [place] or split Xultophy into [place] and [place] . She is also on  Synjardi.She denies polydipsia or polyuria. She has intermittent polyphagia She has retinopathy and reminded her to keep up with eye exam  Recurrent vomiting: she was admitted to [facility] 05/2018 and had small bowel obstruction with sepsis and perforation. Dr. Lavone Neri did her exploratory laparotomy , symptoms not present since her last visit with me   LTJ:QZESP shehas been compliant withTelmisartan. HCTZ, bp is borderline today, no chest pain, dizziness  or palpitation   Hyperlipidemia: sheis back on Atorvastatin andlast LDL was at goal, we will recheck it yearly, last labs   Snoring: she did not go to sleep study, and she did not re-schedule it. She always feels tired. Explained importance of sleep study.She has also notices that she has to take a deep breath when sitting and watching TV, she is taking naps during the day. She states she is worried about living her son at home ( he is grown but has autism)  Discussed home sleep study on her last visit, but she did not have it done - she states she did not hear from anyone about it   Extremity obesity:weight has been stable since last visit, she needs to stop drinking sodas   Hot Flashes/Depression and GAD: she has been on Effexor, no side effects of medication , she states it helps with hot flashes, she can only tolerate effexor one in am and one at lunch , but not  150 mg at once. She always worries about her son that has autism, he repeats himself, gets annoyed, but also worries what will happen to him when she dies. She sates Buspar works well for her and taking up to twice daily, she states sometimes she feels very down and sleeps for a few days in a row, discussed importance of seeing a psychiatrist. She also has panic attacks and agoraphobia.   Herpes: she recently had a rash but not sure if it was herpes, no episodes in years .She would like a refill today   Numbness: she states her entire left side of her body has been numb for the past couple of months, intermittent, sometimes has left side headache but no numbness above her neck   *** Patient Active Problem List   Diagnosis Date Noted  . Endometrial polyp 11/29/2017  . Fibroid 11/29/2017  . Post-menopausal bleeding 11/14/2017  . Mild nonproliferative diabetic retinopathy of both eyes associated with type 2 diabetes mellitus ([place]) 06/09/2016  . Uncontrolled type 2 diabetes mellitus with nonproliferative retinopathy ([place]) 03/01/2016  . Vitamin D deficiency 07/07/2015  . Vitamin B12 deficiency 07/07/2015  . Snoring 12/25/2014  . Allergic rhinitis, seasonal 12/14/2014  . Chronic constipation 12/14/2014  . Diabetes mellitus with renal manifestation ([place]) 12/14/2014  . Dyslipidemia 12/14/2014  . Dermatitis, eczematoid 12/14/2014  . Edema extremities 12/14/2014  . Essential (primary) hypertension 12/14/2014  . Gastro-esophageal reflux disease without esophagitis  12/14/2014  . Genital herpes in women 12/14/2014  . Morbid obesity ([place]) 12/14/2014  . Female climacteric state 12/14/2014    Past Surgical History:  Procedure Laterality Date  . APPLICATION OF WOUND VAC  04/2018  . CESAREAN SECTION  1993  . LAPAROTOMY N/A 05/14/2018   Procedure: EXPLORATORY LAPAROTOMY;  Surgeon: Georganna Skeans, MD;  Location: [place];  Service: General;  Laterality: N/A;  . LAPAROTOMY N/A 05/16/2018   Procedure:  EXPLORATORY LAPAROTOMY with reanatomosis and fascia closure with wound vacum application;  Surgeon: Erroll Luna, MD;  Location: [place];  Service: General;  Laterality: N/A;  . tonsillectomy and adnoidectomy    . WISDOM TOOTH EXTRACTION      Family History  Problem Relation Age of Onset  . Diabetes Mother   . Asthma Mother   . Heart disease Mother   . Heart disease Father   . Prostate cancer Father   . Other Father        blockages in stomach  . Kidney disease Paternal Grandmother   . Asthma Son   . Autism Son   . Breast cancer Neg Hx   . Colon cancer Neg Hx   . Esophageal cancer Neg Hx   . Pancreatic cancer Neg Hx   . Rectal cancer Neg Hx   . Stomach cancer Neg Hx     Social History   Tobacco Use  . Smoking status: Former Smoker    Years: 20.00    Types: Cigarettes    Start date: 04/19/1971    Quit date: 04/2018    Years since quitting: 2.1  . Smokeless tobacco: Never Used  . Tobacco comment: she quitted and started back October 2019, but quit again 04/2018  Substance Use Topics  . Alcohol use: No    Alcohol/week: 0.0 standard drinks     Current Outpatient Medications:  .  acetaminophen (TYLENOL) 325 MG tablet, Take 2 tablets (650 mg total) by mouth every 6 (six) hours as needed for mild pain or headache., Disp: 25 tablet, Rfl: 0 .  aspirin (ASPIRIN LOW DOSE) 81 MG chewable tablet, Chew 1 tablet by mouth daily., Disp: , Rfl:  .  atorvastatin (LIPITOR) 40 MG tablet, Take 1 tablet (40 mg total) by mouth every evening., Disp: 90 tablet, Rfl: 1 .  blood glucose meter kit and supplies, Dispense based on patient and insurance preference. Use up to four times daily as directed. (FOR ICD-10 E10.9, E11.9)., Disp: 1 each, Rfl: 2 .  Blood Glucose Monitoring Suppl (ONE TOUCH ULTRA 2) w/Device KIT, 1 kit by Subdermal route See admin instructions., Disp: 1 each, Rfl: 0 .  busPIRone (BUSPAR) 5 MG tablet, TAKE 1 TABLET BY MOUTH 2 TIMES DAILY AS NEEDED., Disp: 180 tablet, Rfl: 0 .   Cholecalciferol (VITAMIN D) 2000 units CAPS, Take 1 capsule (2,000 Units total) by mouth daily., Disp: 30 capsule, Rfl: 0 .  Empagliflozin-metFORMIN HCl ER (SYNJARDY XR) 25-1000 MG TB24, Take 1 tablet by mouth daily., Disp: 90 tablet, Rfl: 1 .  glucose blood (ONE TOUCH ULTRA TEST) test strip, CHECK FASTING BLOOD SUGARS TWICE DAILY, Disp: 100 each, Rfl: 2 .  Insulin Degludec-Liraglutide (XULTOPHY) 100-3.6 UNIT-MG/ML SOPN, Inject 24-50 Units into the skin daily. Go up by 2 units every three day to keep fasting between 120-140 - goal is 50 units per day, Disp: 15 pen, Rfl: 0 .  Insulin Pen Needle (NOVOFINE) 32G X 6 MM MISC, 1 each by Does not apply route daily., Disp: 100 each, Rfl: 2 .  ONETOUCH DELICA LANCETS FINE MISC, See admin instructions., Disp: , Rfl: 0 .  telmisartan-hydrochlorothiazide (MICARDIS HCT) 80-25 MG tablet, Take 1 tablet by mouth daily., Disp: 90 tablet, Rfl: 1 .  valACYclovir (VALTREX) 500 MG tablet, Take 1 tablet (500 mg total) by mouth 3 (three) times daily as needed (for prevention). And daily for prevention, Disp: 100 tablet, Rfl: 1 .  venlafaxine XR (EFFEXOR-XR) 75 MG 24 hr capsule, TAKE 1 CAPSULE BY MOUTH TWICE A DAY, Disp: 180 capsule, Rfl: 0 .  Vitamin D, Ergocalciferol, (DRISDOL) 1.25 MG (50000 UNIT) CAPS capsule, TAKE 1 CAPSULE (50,000 UNITS TOTAL) BY MOUTH EVERY 7 (SEVEN) DAYS., Disp: 12 capsule, Rfl: 1  Allergies  Allergen Reactions  . Penicillins Swelling    Did it involve swelling of the face/tongue/throat, SOB, or low BP? Yes Did it involve sudden or severe rash/hives, skin peeling, or any reaction on the inside of your mouth or nose? No Did you need to seek medical attention at a hospital or doctor's office? Yes When did it last happen? unknown If all above answers are "NO", may proceed with cephalosporin use.     I personally reviewed {Reviewed:14835} with the patient/caregiver today.   ROS  ***  Objective  There were no vitals filed for this  visit.  There is no height or weight on file to calculate BMI.  Physical Exam ***  No results found for this or any previous visit (from the past 2160 hour(s)).  Diabetic Foot Exam: Diabetic Foot Exam - Simple   No data filed    ***  PHQ2/9: Depression screen [facility] 2/9 03/04/2020 03/04/2020 10/15/2019 06/19/2019 01/15/2019  Decreased Interest 3 3 0 1 0  Down, Depressed, Hopeless 3 3 0 0 0  PHQ - 2 Score 6 6 0 1 0  Altered sleeping 3 - 0 2 0  Tired, decreased energy 3 - 0 0 0  Change in appetite 0 - 0 1 0  Feeling bad or failure about yourself  0 - 0 0 0  Trouble concentrating 0 - 0 0 0  Moving slowly or fidgety/restless 0 - 0 0 0  Suicidal thoughts 0 - 0 0 0  PHQ-9 Score 12 - 0 4 0  Difficult doing work/chores Very difficult - - Somewhat difficult -  Some recent data might be hidden    phq 9 is {gen pos BBC:[id number]} ***  Fall Risk: Fall Risk  03/04/2020 10/15/2019 06/19/2019 10/15/2018 06/27/2018  Falls in the past year? 1 0 0 0 0  Number falls in past yr: 1 0 0 0 0  Injury with Fall? 0 0 0 0 0  Risk for fall due to : History of fall(s) - - - -  Follow up - - - - -   ***   Functional Status Survey:   ***   Assessment & Plan  *** There are no diagnoses linked to this encounter.

## 2020-06-05 ENCOUNTER — Ambulatory Visit: Payer: 59 | Admitting: Family Medicine

## 2020-06-05 DIAGNOSIS — E1121 Type 2 diabetes mellitus with diabetic nephropathy: Secondary | ICD-10-CM

## 2020-06-08 NOTE — Progress Notes (Unsigned)
Name: Wendy [name]   MRN: [id number]    DOB: 1961-02-11   Date:06/09/2020       Progress Note  Subjective  Chief Complaint  Follow Up  HPI  DM with nephropathy, retinopathy:hgbA1C went up from 7.1% to 7.7%, 7.5%., 8.1 % ,9.7,8.2%, 7.3%7.6%,8.5% down again  7.4% , 8.4 %, 7.7 % and today is up again to 8.4 %. . She is using Xultophy at 23  units she states the cost is too high even with insurance, advised her to contact insurance and see what would be cheaper.. She hs been compliant with Synjardi She denies polydipsia or polyuria. She has intermittent polyphagia She has retinopathy and reminded her to keep up with eye exam. She also has dyslipidemia, HTN, obesity . Discussed again importance of stop drinking sodas. She would like to try Trulicity again   Recurrent vomiting: she was admitted to [facility] 05/2018 and had small bowel obstruction with sepsis and perforation. Dr. Lavone Neri did her exploratory laparotomy , symptoms not present since her last visit with me . She has mid abdominal pain and need to have a bowel movement after she eats. She states symptoms are stable, colonoscopy up to date. This is not a new problem  QHU:TMLYY shehas been compliant withTelmisartan. HCTZ, bp is bat goal today, no chest pain or palpitation   Hyperlipidemia: sheis back on Atorvastatin andlast LDL was at goal, no side effects of medications   Snoring: she did not go to sleep study, and she did not re-schedule it, she would like to try a home study instead. . She always feels tired. Explained importance of sleep study.She has also notices that she has to take a deep breath when sitting and watching TV, she is taking naps during the day. She states she is worried about living her son at home ( he is grown but has autism)  We will schedule a home sleep study . She has headaches, described as sharp on frontal or occipital area, not very often now, discussed importance of sleep study   Extremity  obesity:weight has been stable since last visit, she needs to stop drinking sodas , also needs to go for daily walks   Hot Flashes/Depression and GAD: she has been on Effexor, no side effects of medication , she states it helps with hot flashes, she can only tolerate effexor one in am and one at lunch , but not 150 mg at once, but wants to try it again . She always worries about her son that has autism, he repeats himself, she gets annoyed at times, but also worries what will happen to him when she dies. She sates Buspar works well for her and taking up to twice daily, she states sometimes she feels very down and sleeps for a few days in a row, we placed referral to psychiatrist but states they told her they were short staffed and needs to go to another facility , she wants to be seen in [place]   Herpes: she recently had a rash but not sure if it was herpes, no episodes in years   Patient Active Problem List   Diagnosis Date Noted  . Endometrial polyp 11/29/2017  . Fibroid 11/29/2017  . Post-menopausal bleeding 11/14/2017  . Mild nonproliferative diabetic retinopathy of both eyes associated with type 2 diabetes mellitus ([place]) 06/09/2016  . Uncontrolled type 2 diabetes mellitus with nonproliferative retinopathy ([place]) 03/01/2016  . Vitamin D deficiency 07/07/2015  . Vitamin B12 deficiency 07/07/2015  . Snoring 12/25/2014  .  Allergic rhinitis, seasonal 12/14/2014  . Chronic constipation 12/14/2014  . Diabetes mellitus with renal manifestation ([place]) 12/14/2014  . Dyslipidemia 12/14/2014  . Dermatitis, eczematoid 12/14/2014  . Edema extremities 12/14/2014  . Essential (primary) hypertension 12/14/2014  . Gastro-esophageal reflux disease without esophagitis 12/14/2014  . Genital herpes in women 12/14/2014  . Morbid obesity ([place]) 12/14/2014  . Female climacteric state 12/14/2014    Past Surgical History:  Procedure Laterality Date  . APPLICATION OF WOUND VAC  04/2018  . CESAREAN  SECTION  1993  . LAPAROTOMY N/A 05/14/2018   Procedure: EXPLORATORY LAPAROTOMY;  Surgeon: Georganna Skeans, MD;  Location: [place];  Service: General;  Laterality: N/A;  . LAPAROTOMY N/A 05/16/2018   Procedure: EXPLORATORY LAPAROTOMY with reanatomosis and fascia closure with wound vacum application;  Surgeon: Erroll Luna, MD;  Location: [place];  Service: General;  Laterality: N/A;  . tonsillectomy and adnoidectomy    . WISDOM TOOTH EXTRACTION      Family History  Problem Relation Age of Onset  . Diabetes Mother   . Asthma Mother   . Heart disease Mother   . Heart disease Father   . Prostate cancer Father   . Other Father        blockages in stomach  . Kidney disease Paternal Grandmother   . Asthma Son   . Autism Son   . Breast cancer Neg Hx   . Colon cancer Neg Hx   . Esophageal cancer Neg Hx   . Pancreatic cancer Neg Hx   . Rectal cancer Neg Hx   . Stomach cancer Neg Hx     Social History   Tobacco Use  . Smoking status: Former Smoker    Years: 20.00    Types: Cigarettes    Start date: 04/19/1971    Quit date: 04/2018    Years since quitting: 2.1  . Smokeless tobacco: Never Used  . Tobacco comment: she quitted and started back October 2019, but quit again 04/2018  Substance Use Topics  . Alcohol use: No    Alcohol/week: 0.0 standard drinks     Current Outpatient Medications:  .  acetaminophen (TYLENOL) 325 MG tablet, Take 2 tablets (650 mg total) by mouth every 6 (six) hours as needed for mild pain or headache., Disp: 25 tablet, Rfl: 0 .  blood glucose meter kit and supplies, Dispense based on patient and insurance preference. Use up to four times daily as directed. (FOR ICD-10 E10.9, E11.9)., Disp: 1 each, Rfl: 2 .  Blood Glucose Monitoring Suppl (ONE TOUCH ULTRA 2) w/Device KIT, 1 kit by Subdermal route See admin instructions., Disp: 1 each, Rfl: 0 .  Cholecalciferol (VITAMIN D) 2000 units CAPS, Take 1 capsule (2,000 Units total) by mouth daily., Disp: 30 capsule,  Rfl: 0 .  Dulaglutide (TRULICITY) 1.5 CH/8.5ID SOPN, Inject 1.5 mg into the skin once a week., Disp: 6 mL, Rfl: 0 .  glucose blood (ONE TOUCH ULTRA TEST) test strip, CHECK FASTING BLOOD SUGARS TWICE DAILY, Disp: 100 each, Rfl: 2 .  ONETOUCH DELICA LANCETS FINE MISC, See admin instructions., Disp: , Rfl: 0 .  valACYclovir (VALTREX) 500 MG tablet, Take 1 tablet (500 mg total) by mouth 3 (three) times daily as needed (for prevention). And daily for prevention, Disp: 100 tablet, Rfl: 1 .  Vitamin D, Ergocalciferol, (DRISDOL) 1.25 MG (50000 UNIT) CAPS capsule, TAKE 1 CAPSULE (50,000 UNITS TOTAL) BY MOUTH EVERY 7 (SEVEN) DAYS., Disp: 12 capsule, Rfl: 1 .  atorvastatin (LIPITOR) 40 MG tablet, Take  1 tablet (40 mg total) by mouth every evening., Disp: 90 tablet, Rfl: 1 .  busPIRone (BUSPAR) 5 MG tablet, Take 1 tablet (5 mg total) by mouth 2 (two) times daily., Disp: 180 tablet, Rfl: 0 .  Empagliflozin-metFORMIN HCl ER (SYNJARDY XR) 25-1000 MG TB24, Take 1 tablet by mouth daily., Disp: 90 tablet, Rfl: 1 .  telmisartan-hydrochlorothiazide (MICARDIS HCT) 80-25 MG tablet, Take 1 tablet by mouth daily., Disp: 90 tablet, Rfl: 1 .  venlafaxine XR (EFFEXOR-XR) 150 MG 24 hr capsule, Take 1 capsule (150 mg total) by mouth daily with breakfast., Disp: 90 capsule, Rfl: 0  Allergies  Allergen Reactions  . Penicillins Swelling    Did it involve swelling of the face/tongue/throat, SOB, or low BP? Yes Did it involve sudden or severe rash/hives, skin peeling, or any reaction on the inside of your mouth or nose? No Did you need to seek medical attention at a hospital or doctor's office? Yes When did it last happen? unknown If all above answers are "NO", may proceed with cephalosporin use.     I personally reviewed active problem list, medication list, allergies, family history, social history, health maintenance with the patient/caregiver today.   ROS  Constitutional: Negative for fever or weight change.   Respiratory: Negative for cough and shortness of breath.   Cardiovascular: Negative for chest pain or palpitations.  Gastrointestinal: Negative for abdominal pain, no bowel changes.  Musculoskeletal: Negative for gait problem or joint swelling.  Skin: Negative for rash.  Neurological: Negative for dizziness or headache.  No other specific complaints in a complete review of systems (except as listed in HPI above).  Objective  Vitals:   06/09/20 1343  BP: 124/86  Pulse: 93  Resp: 16  Temp: 98 F (36.7 C)  SpO2: 98%  Weight: 253 lb 3.2 oz (114.9 kg)    Body mass index is 44.85 kg/m.  Physical Exam  Constitutional: Patient appears well-developed and well-nourished. Obese  No distress.  HEENT: head atraumatic, normocephalic, pupils equal and reactive to light,  neck supple Cardiovascular: Normal rate, regular rhythm and normal heart sounds.  No murmur heard. No BLE edema. Pulmonary/Chest: Effort normal and breath sounds normal. No respiratory distress. Abdominal: Soft.  There is no tenderness. Psychiatric: Patient has a normal mood and affect. behavior is normal. Judgment and thought content normal.  Recent Results (from the past 2160 hour(s))  POCT HgB A1C     Status: Abnormal   Collection Time: 06/09/20  1:49 PM  Result Value Ref Range   Hemoglobin A1C 8.4 (A) 4.0 - 5.6 %   HbA1c POC (<> result, manual entry)     HbA1c, POC (prediabetic range)     HbA1c, POC (controlled diabetic range)     Diabetic Foot Exam - Simple   Simple Foot Form Diabetic Foot exam was performed with the following findings: Yes 06/09/2020  2:20 PM  Visual Inspection See comments: Yes Sensation Testing Intact to touch and monofilament testing bilaterally: Yes Pulse Check Posterior Tibialis and Dorsalis pulse intact bilaterally: Yes Comments Dry heels       PHQ2/9: Depression screen [facility] 2/9 06/09/2020 03/04/2020 03/04/2020 10/15/2019 06/19/2019  Decreased Interest 3 3 3  0 1  Down, Depressed,  Hopeless 1 3 3  0 0  PHQ - 2 Score 4 6 6  0 1  Altered sleeping 0 3 - 0 2  Tired, decreased energy 0 3 - 0 0  Change in appetite 0 0 - 0 1  Feeling bad or failure about yourself  0 0 - 0 0  Trouble concentrating 0 0 - 0 0  Moving slowly or fidgety/restless 0 0 - 0 0  Suicidal thoughts 0 0 - 0 0  PHQ-9 Score 4 12 - 0 4  Difficult doing work/chores - Very difficult - - Somewhat difficult  Some recent data might be hidden    phq 9 is positive   Fall Risk: Fall Risk  06/09/2020 03/04/2020 10/15/2019 06/19/2019 10/15/2018  Falls in the past year? 0 1 0 0 0  Number falls in past yr: 0 1 0 0 0  Injury with Fall? 0 0 0 0 0  Risk for fall due to : - History of fall(s) - - -  Follow up - - - - -    Functional Status Survey: Is the patient deaf or have difficulty hearing?: No Does the patient have difficulty seeing, even when wearing glasses/contacts?: No Does the patient have difficulty concentrating, remembering, or making decisions?: Yes Does the patient have difficulty walking or climbing stairs?: Yes Does the patient have difficulty dressing or bathing?: No Does the patient have difficulty doing errands alone such as visiting a doctor's office or shopping?: No    Assessment & Plan  1. Diabetes mellitus type 2 in obese (HCC)  - HM Diabetes Foot Exam - POCT HgB A1C - Dulaglutide (TRULICITY) 1.5 EN/2.7PO SOPN; Inject 1.5 mg into the skin once a week.  Dispense: 6 mL; Refill: 0  2. Vitamin B12 deficiency  - cyanocobalamin ((VITAMIN B-12)) injection 1,000 mcg  3. Morbid obesity ([place])  Discussed with the patient the risk posed by an increased BMI. Discussed importance of portion control, calorie counting and at least 150 minutes of physical activity weekly. Avoid sweet beverages and drink more water. Eat at least 6 servings of fruit and vegetables daily   4. Essential (primary) hypertension  - telmisartan-hydrochlorothiazide (MICARDIS HCT) 80-25 MG tablet; Take 1 tablet by mouth  daily.  Dispense: 90 tablet; Refill: 1  5. Dyslipidemia  - atorvastatin (LIPITOR) 40 MG tablet; Take 1 tablet (40 mg total) by mouth every evening.  Dispense: 90 tablet; Refill: 1  6. GAD (generalized anxiety disorder)  - busPIRone (BUSPAR) 5 MG tablet; Take 1 tablet (5 mg total) by mouth 2 (two) times daily.  Dispense: 180 tablet; Refill: 0 - venlafaxine XR (EFFEXOR-XR) 150 MG 24 hr capsule; Take 1 capsule (150 mg total) by mouth daily with breakfast.  Dispense: 90 capsule; Refill: 0 - Ambulatory referral to Psychiatry  7. Vitamin D deficiency   8. Type 2 diabetes mellitus with chronic kidney disease and hypertension ([place])   9. Nonproliferative retinopathy due to secondary diabetes mellitus (HCC)  - Empagliflozin-metFORMIN HCl ER (SYNJARDY XR) 25-1000 MG TB24; Take 1 tablet by mouth daily.  Dispense: 90 tablet; Refill: 1  10. Female climacteric state  - venlafaxine XR (EFFEXOR-XR) 150 MG 24 hr capsule; Take 1 capsule (150 mg total) by mouth daily with breakfast.  Dispense: 90 capsule; Refill: 0  11. Snoring  - Home sleep test

## 2020-06-09 ENCOUNTER — Other Ambulatory Visit: Payer: Self-pay

## 2020-06-09 ENCOUNTER — Encounter: Payer: Self-pay | Admitting: Family Medicine

## 2020-06-09 ENCOUNTER — Ambulatory Visit (INDEPENDENT_AMBULATORY_CARE_PROVIDER_SITE_OTHER): Payer: 59 | Admitting: Family Medicine

## 2020-06-09 VITALS — BP 124/86 | HR 93 | Temp 98.0°F | Resp 16 | Wt 253.2 lb

## 2020-06-09 DIAGNOSIS — R0683 Snoring: Secondary | ICD-10-CM

## 2020-06-09 DIAGNOSIS — E669 Obesity, unspecified: Secondary | ICD-10-CM | POA: Diagnosis not present

## 2020-06-09 DIAGNOSIS — I10 Essential (primary) hypertension: Secondary | ICD-10-CM | POA: Diagnosis not present

## 2020-06-09 DIAGNOSIS — I129 Hypertensive chronic kidney disease with stage 1 through stage 4 chronic kidney disease, or unspecified chronic kidney disease: Secondary | ICD-10-CM

## 2020-06-09 DIAGNOSIS — E1169 Type 2 diabetes mellitus with other specified complication: Secondary | ICD-10-CM

## 2020-06-09 DIAGNOSIS — E538 Deficiency of other specified B group vitamins: Secondary | ICD-10-CM | POA: Diagnosis not present

## 2020-06-09 DIAGNOSIS — F411 Generalized anxiety disorder: Secondary | ICD-10-CM

## 2020-06-09 DIAGNOSIS — E785 Hyperlipidemia, unspecified: Secondary | ICD-10-CM

## 2020-06-09 DIAGNOSIS — E559 Vitamin D deficiency, unspecified: Secondary | ICD-10-CM

## 2020-06-09 DIAGNOSIS — E133299 Other specified diabetes mellitus with mild nonproliferative diabetic retinopathy without macular edema, unspecified eye: Secondary | ICD-10-CM

## 2020-06-09 DIAGNOSIS — IMO0001 Reserved for inherently not codable concepts without codable children: Secondary | ICD-10-CM

## 2020-06-09 DIAGNOSIS — N951 Menopausal and female climacteric states: Secondary | ICD-10-CM

## 2020-06-09 DIAGNOSIS — E1122 Type 2 diabetes mellitus with diabetic chronic kidney disease: Secondary | ICD-10-CM

## 2020-06-09 DIAGNOSIS — E1121 Type 2 diabetes mellitus with diabetic nephropathy: Secondary | ICD-10-CM

## 2020-06-09 LAB — POCT GLYCOSYLATED HEMOGLOBIN (HGB A1C): Hemoglobin A1C: 8.4 % — AB (ref 4.0–5.6)

## 2020-06-09 MED ORDER — TRULICITY 1.5 MG/0.5ML ~~LOC~~ SOAJ: 1.5000 mg | SUBCUTANEOUS | 0 refills | Status: DC

## 2020-06-09 MED ORDER — BUSPIRONE HCL 5 MG PO TABS: 5.0000 mg | ORAL_TABLET | Freq: Two times a day (BID) | ORAL | 0 refills | Status: DC

## 2020-06-09 MED ORDER — TELMISARTAN-HCTZ 80-25 MG PO TABS: 1.0000 | ORAL_TABLET | Freq: Every day | ORAL | 1 refills | Status: DC

## 2020-06-09 MED ORDER — VENLAFAXINE HCL ER 150 MG PO CP24: 150.0000 mg | ORAL_CAPSULE | Freq: Every day | ORAL | 0 refills | Status: DC

## 2020-06-09 MED ORDER — SYNJARDY XR 25-1000 MG PO TB24: 1.0000 | ORAL_TABLET | Freq: Every day | ORAL | 1 refills | Status: DC

## 2020-06-09 MED ORDER — CYANOCOBALAMIN 1000 MCG/ML IJ SOLN
1000.0000 ug | Freq: Once | INTRAMUSCULAR | Status: AC
Start: 2020-06-09 — End: 2020-06-09
  Administered 2020-06-09: 1000 ug via INTRAMUSCULAR

## 2020-06-09 MED ORDER — ATORVASTATIN CALCIUM 40 MG PO TABS: 40.0000 mg | ORAL_TABLET | Freq: Every evening | ORAL | 1 refills | Status: DC

## 2020-06-24 ENCOUNTER — Other Ambulatory Visit: Payer: Self-pay

## 2020-06-24 ENCOUNTER — Ambulatory Visit (INDEPENDENT_AMBULATORY_CARE_PROVIDER_SITE_OTHER): Payer: 59

## 2020-06-24 DIAGNOSIS — E538 Deficiency of other specified B group vitamins: Secondary | ICD-10-CM

## 2020-06-24 MED ORDER — CYANOCOBALAMIN 1000 MCG/ML IJ SOLN
1000.0000 ug | Freq: Once | INTRAMUSCULAR | Status: AC
  Administered 2020-06-24: 1000 ug via INTRAMUSCULAR

## 2020-09-07 NOTE — Progress Notes (Signed)
Name: Wendy [name]   MRN: [id number]    DOB: Dec 28, 1960   Date:09/08/2020       Progress Note  Subjective  Chief Complaint  Follow Up  HPI  DM with nephropathy, retinopathy:hgbA1C went up from 7.1% to 7.7%, 7.5%., 8.1 % ,9.7,8.2%, 7.3%7.6%,8.5% down again  7.4% , 8.4 %, 7.7 % up to  8.4 % today it is down to 7.1 % . We stopped Xultophy due to cost and is now on Trulicity and synjardi and is doing well. She denies polyphagia, polydipsia or polyuria. She has retinopathy and reminded her to keep up with eye exam. She also has dyslipidemia, HTN, obesity . Discussed again importance of stop drinking sodas. Trulicity is curbing her appetite  JGO:TLXBW shehas been compliant withTelmisartan. HCTZ, bp is bat goal today, no chest pain, palpitation or dizziness   Hyperlipidemia: sheis back on Atorvastatin andlast LDL was at goal, no side effects of medications   Sinusitis: she states she noticed left side rhinorrhea a couple of weeks ago, she is now having pain on left maxillary sinus, some left ear fullness, she has some chills, she is taking otc nsaid's and nasal spray   Snoring: she did not go to sleep study, and she did not re-schedule it, she would like to try a home study instead. . She always feels tired. Explained importance of sleep study.She has also notices that she has to take a deep breath when sitting and watching TV, she is taking naps during the day. She states she is worried about living her son at home ( he is grown but has autism)  She has not been contacted about sleep study yet , contacted out referral coordinator and she needs to contact her insurance to find out who is covered   Extremity obesity: she lost weight , 10 lbs since resume Trulicity 3 months ago, she needs to avoid sodas   Hot Flashes/Depression and GAD: she has been on Effexor, no side effects of medication , she states it helps with hot flashes, she can only tolerate effexor one in am and one at  lunch , but not 150 mg at once, but wants to try it again . She always worries about her son that has autism, he repeats himself, she gets annoyed at times, but also worries what will happen to him when she dies. She sates Buspar works well for her and taking up to twice daily, she states sometimes she feels very down and sleeps for a few days in a row, we placed referral to psychiatrist but she never heard from them. Gave her a list of psychiatrist in [place] to her, including the one that she can walk in to be seen   Herpes: she recently had a rash but not sure if it was herpes, no episodes in years but needs valtrex    Patient Active Problem List   Diagnosis Date Noted  . Endometrial polyp 11/29/2017  . Fibroid 11/29/2017  . Post-menopausal bleeding 11/14/2017  . Mild nonproliferative diabetic retinopathy of both eyes associated with type 2 diabetes mellitus ([place]) 06/09/2016  . Uncontrolled type 2 diabetes mellitus with nonproliferative retinopathy ([place]) 03/01/2016  . Vitamin D deficiency 07/07/2015  . Vitamin B12 deficiency 07/07/2015  . Snoring 12/25/2014  . Allergic rhinitis, seasonal 12/14/2014  . Chronic constipation 12/14/2014  . Diabetes mellitus with renal manifestation ([place]) 12/14/2014  . Dyslipidemia 12/14/2014  . Dermatitis, eczematoid 12/14/2014  . Edema extremities 12/14/2014  . Essential (primary) hypertension 12/14/2014  .  Gastro-esophageal reflux disease without esophagitis 12/14/2014  . Genital herpes in women 12/14/2014  . Morbid obesity ([place]) 12/14/2014  . Female climacteric state 12/14/2014    Past Surgical History:  Procedure Laterality Date  . APPLICATION OF WOUND VAC  04/2018  . CESAREAN SECTION  1993  . LAPAROTOMY N/A 05/14/2018   Procedure: EXPLORATORY LAPAROTOMY;  Surgeon: Georganna Skeans, MD;  Location: [place];  Service: General;  Laterality: N/A;  . LAPAROTOMY N/A 05/16/2018   Procedure: EXPLORATORY LAPAROTOMY with reanatomosis and fascia closure  with wound vacum application;  Surgeon: Erroll Luna, MD;  Location: [place];  Service: General;  Laterality: N/A;  . tonsillectomy and adnoidectomy    . WISDOM TOOTH EXTRACTION      Family History  Problem Relation Age of Onset  . Diabetes Mother   . Asthma Mother   . Heart disease Mother   . Heart disease Father   . Prostate cancer Father   . Other Father        blockages in stomach  . Kidney disease Paternal Grandmother   . Asthma Son   . Autism Son   . Breast cancer Neg Hx   . Colon cancer Neg Hx   . Esophageal cancer Neg Hx   . Pancreatic cancer Neg Hx   . Rectal cancer Neg Hx   . Stomach cancer Neg Hx     Social History   Tobacco Use  . Smoking status: Former Smoker    Years: 20.00    Types: Cigarettes    Start date: 04/19/1971    Quit date: 04/2018    Years since quitting: 2.3  . Smokeless tobacco: Never Used  . Tobacco comment: she quit and started back October 2019, but quit again 04/2018  Substance Use Topics  . Alcohol use: No    Alcohol/week: 0.0 standard drinks     Current Outpatient Medications:  .  acetaminophen (TYLENOL) 325 MG tablet, Take 2 tablets (650 mg total) by mouth every 6 (six) hours as needed for mild pain or headache., Disp: 25 tablet, Rfl: 0 .  atorvastatin (LIPITOR) 40 MG tablet, Take 1 tablet (40 mg total) by mouth every evening., Disp: 90 tablet, Rfl: 1 .  blood glucose meter kit and supplies, Dispense based on patient and insurance preference. Use up to four times daily as directed. (FOR ICD-10 E10.9, E11.9)., Disp: 1 each, Rfl: 2 .  Blood Glucose Monitoring Suppl (ONE TOUCH ULTRA 2) w/Device KIT, 1 kit by Subdermal route See admin instructions., Disp: 1 each, Rfl: 0 .  busPIRone (BUSPAR) 5 MG tablet, Take 1 tablet (5 mg total) by mouth 2 (two) times daily., Disp: 180 tablet, Rfl: 0 .  Cholecalciferol (VITAMIN D) 2000 units CAPS, Take 1 capsule (2,000 Units total) by mouth daily., Disp: 30 capsule, Rfl: 0 .  Dulaglutide (TRULICITY) 1.5  TI/4.5YK SOPN, Inject 1.5 mg into the skin once a week., Disp: 6 mL, Rfl: 0 .  Empagliflozin-metFORMIN HCl ER (SYNJARDY XR) 25-1000 MG TB24, Take 1 tablet by mouth daily., Disp: 90 tablet, Rfl: 1 .  glucose blood (ONE TOUCH ULTRA TEST) test strip, CHECK FASTING BLOOD SUGARS TWICE DAILY, Disp: 100 each, Rfl: 2 .  ONETOUCH DELICA LANCETS FINE MISC, See admin instructions., Disp: , Rfl: 0 .  telmisartan-hydrochlorothiazide (MICARDIS HCT) 80-25 MG tablet, Take 1 tablet by mouth daily., Disp: 90 tablet, Rfl: 1 .  valACYclovir (VALTREX) 500 MG tablet, Take 1 tablet (500 mg total) by mouth 3 (three) times daily as needed (for prevention).  And daily for prevention, Disp: 100 tablet, Rfl: 1 .  venlafaxine XR (EFFEXOR-XR) 150 MG 24 hr capsule, Take 1 capsule (150 mg total) by mouth daily with breakfast., Disp: 90 capsule, Rfl: 0 .  Vitamin D, Ergocalciferol, (DRISDOL) 1.25 MG (50000 UNIT) CAPS capsule, TAKE 1 CAPSULE (50,000 UNITS TOTAL) BY MOUTH EVERY 7 (SEVEN) DAYS., Disp: 12 capsule, Rfl: 1  Allergies  Allergen Reactions  . Penicillins Swelling    Did it involve swelling of the face/tongue/throat, SOB, or low BP? Yes Did it involve sudden or severe rash/hives, skin peeling, or any reaction on the inside of your mouth or nose? No Did you need to seek medical attention at a hospital or doctor's office? Yes When did it last happen? unknown If all above answers are "NO", may proceed with cephalosporin use.     I personally reviewed active problem list, medication list, allergies, family history, social history, health maintenance with the patient/caregiver today.   ROS  Constitutional: Negative for fever , positive for weight change.  Respiratory: Negative for cough and shortness of breath.   Cardiovascular: Negative for chest pain or palpitations.  Gastrointestinal: Negative for abdominal pain, no bowel changes.  Musculoskeletal: Negative for gait problem or joint swelling.  Skin: Negative for  rash.  Neurological: Negative for dizziness or headache.  No other specific complaints in a complete review of systems (except as listed in HPI above).  Objective  Vitals:   09/08/20 1434  BP: 122/84  Pulse: 100  Resp: 18  Temp: 98.1 F (36.7 C)  TempSrc: Oral  SpO2: 97%  Weight: 242 lb 3.2 oz (109.9 kg)  Height: _0  (1.6 m)    Body mass index is 42.9 kg/m.  Physical Exam  Constitutional: Patient appears well-developed and well-nourished. Obese  No distress.  HEENT: head atraumatic, normocephalic, pupils equal and reactive to light, ears normal,  neck supple, torus palatinus, she also has poor dentition but gum line seems okay , missing teeth  Cardiovascular: Normal rate, regular rhythm and normal heart sounds.  No murmur heard. No BLE edema. Pulmonary/Chest: Effort normal and breath sounds normal. No respiratory distress. Abdominal: Soft.  There is no tenderness. Psychiatric: Patient has a normal mood and affect. behavior is normal. Judgment and thought content normal.  Recent Results (from the past 2160 hour(s))  POCT HgB A1C     Status: Abnormal   Collection Time: 09/08/20  2:42 PM  Result Value Ref Range   Hemoglobin A1C 7.1 (A) 4.0 - 5.6 %   HbA1c POC (<> result, manual entry)     HbA1c, POC (prediabetic range)     HbA1c, POC (controlled diabetic range)       PHQ2/9: Depression screen [facility] 2/9 09/08/2020 06/09/2020 03/04/2020 03/04/2020 10/15/2019  Decreased Interest _1 0  Down, Depressed, Hopeless _2 0  PHQ - 2 Score _3 0  Altered sleeping 3 0 3 - 0  Tired, decreased energy 3 0 3 - 0  Change in appetite 0 0 0 - 0  Feeling bad or failure about yourself  2 0 0 - 0  Trouble concentrating 0 0 0 - 0  Moving slowly or fidgety/restless 1 0 0 - 0  Suicidal thoughts 0 0 0 - 0  PHQ-9 Score _4 - 0  Difficult doing work/chores Very difficult - Very difficult - -  Some recent data might be hidden    phq 9 is positive   Fall Risk:  Fall Risk   09/08/2020 06/09/2020 03/04/2020 10/15/2019 06/19/2019  Falls in the past year? 0 0 1 0 0  Number falls in past yr: 0 0 1 0 0  Injury with Fall? 0 0 0 0 0  Risk for fall due to : - - History of fall(s) - -  Follow up Falls prevention discussed - - - -     Functional Status Survey: Is the patient deaf or have difficulty hearing?: Yes Does the patient have difficulty seeing, even when wearing glasses/contacts?: No Does the patient have difficulty concentrating, remembering, or making decisions?: Yes Does the patient have difficulty walking or climbing stairs?: Yes Does the patient have difficulty dressing or bathing?: No Does the patient have difficulty doing errands alone such as visiting a doctor's office or shopping?: Yes    Assessment & Plan  1. Diabetes mellitus type 2 in obese (HCC)  - POCT HgB A1C - Dulaglutide (TRULICITY) 3 NW/2.9FA SOPN; Inject 3 mg as directed once a week.  Dispense: 6 mL; Refill: 0  2. GAD (generalized anxiety disorder)  - venlafaxine XR (EFFEXOR-XR) 150 MG 24 hr capsule; Take 1 capsule (150 mg total) by mouth daily with breakfast.  Dispense: 90 capsule; Refill: 0  Gave her information about psychiatrists in [place]   3. Female climacteric state  - venlafaxine XR (EFFEXOR-XR) 150 MG 24 hr capsule; Take 1 capsule (150 mg total) by mouth daily with breakfast.  Dispense: 90 capsule; Refill: 0  4. Dyslipidemia   5. Essential (primary) hypertension  At goal   6. Morbid obesity ([place])  Discussed with the patient the risk posed by an increased BMI. Discussed importance of portion control, calorie counting and at least 150 minutes of physical activity weekly. Avoid sweet beverages and drink more water. Eat at least 6 servings of fruit and vegetables daily   7. Vitamin B12 deficiency   8. Vitamin D deficiency  - Vitamin D, Ergocalciferol, (DRISDOL) 1.25 MG (50000 UNIT) CAPS capsule; Take 1 capsule (50,000 Units total) by mouth every 7 (seven) days.   Dispense: 12 capsule; Refill: 1  9. Type 2 diabetes mellitus with chronic kidney disease and hypertension ([place])   10. Left maxillary sinusitis  - doxycycline (VIBRA-TABS) 100 MG tablet; Take 1 tablet (100 mg total) by mouth 2 (two) times daily.  Dispense: 20 tablet; Refill: 0 Must see dentist to have teeth extracted   11. Herpes simplex vulvovaginitis  - valACYclovir (VALTREX) 500 MG tablet; Take 1 tablet (500 mg total) by mouth 3 (three) times daily as needed (for prevention). And daily for prevention  Dispense: 100 tablet; Refill: 1

## 2020-09-08 ENCOUNTER — Encounter: Payer: Self-pay | Admitting: Family Medicine

## 2020-09-08 ENCOUNTER — Ambulatory Visit (INDEPENDENT_AMBULATORY_CARE_PROVIDER_SITE_OTHER): Payer: 59 | Admitting: Family Medicine

## 2020-09-08 ENCOUNTER — Other Ambulatory Visit: Payer: Self-pay

## 2020-09-08 ENCOUNTER — Other Ambulatory Visit: Payer: Self-pay | Admitting: Family Medicine

## 2020-09-08 VITALS — BP 122/84 | HR 100 | Temp 98.1°F | Resp 18 | Ht 63.0 in | Wt 242.2 lb

## 2020-09-08 DIAGNOSIS — E538 Deficiency of other specified B group vitamins: Secondary | ICD-10-CM

## 2020-09-08 DIAGNOSIS — E785 Hyperlipidemia, unspecified: Secondary | ICD-10-CM

## 2020-09-08 DIAGNOSIS — E1169 Type 2 diabetes mellitus with other specified complication: Secondary | ICD-10-CM | POA: Diagnosis not present

## 2020-09-08 DIAGNOSIS — J320 Chronic maxillary sinusitis: Secondary | ICD-10-CM

## 2020-09-08 DIAGNOSIS — F411 Generalized anxiety disorder: Secondary | ICD-10-CM

## 2020-09-08 DIAGNOSIS — E669 Obesity, unspecified: Secondary | ICD-10-CM

## 2020-09-08 DIAGNOSIS — E559 Vitamin D deficiency, unspecified: Secondary | ICD-10-CM

## 2020-09-08 DIAGNOSIS — A6004 Herpesviral vulvovaginitis: Secondary | ICD-10-CM

## 2020-09-08 DIAGNOSIS — E1122 Type 2 diabetes mellitus with diabetic chronic kidney disease: Secondary | ICD-10-CM

## 2020-09-08 DIAGNOSIS — I10 Essential (primary) hypertension: Secondary | ICD-10-CM

## 2020-09-08 DIAGNOSIS — IMO0001 Reserved for inherently not codable concepts without codable children: Secondary | ICD-10-CM

## 2020-09-08 DIAGNOSIS — N951 Menopausal and female climacteric states: Secondary | ICD-10-CM

## 2020-09-08 DIAGNOSIS — I129 Hypertensive chronic kidney disease with stage 1 through stage 4 chronic kidney disease, or unspecified chronic kidney disease: Secondary | ICD-10-CM

## 2020-09-08 LAB — POCT GLYCOSYLATED HEMOGLOBIN (HGB A1C): Hemoglobin A1C: 7.1 % — AB (ref 4.0–5.6)

## 2020-09-08 MED ORDER — VENLAFAXINE HCL ER 150 MG PO CP24: 150.0000 mg | ORAL_CAPSULE | Freq: Every day | ORAL | 0 refills | Status: DC

## 2020-09-08 MED ORDER — TRULICITY 3 MG/0.5ML ~~LOC~~ SOAJ: 3.0000 mg | SUBCUTANEOUS | 0 refills | Status: DC

## 2020-09-08 MED ORDER — VITAMIN D (ERGOCALCIFEROL) 1.25 MG (50000 UNIT) PO CAPS: 50000.0000 [IU] | ORAL_CAPSULE | ORAL | 1 refills | Status: DC

## 2020-09-08 MED ORDER — DOXYCYCLINE HYCLATE 100 MG PO TABS: 100.0000 mg | ORAL_TABLET | Freq: Two times a day (BID) | ORAL | 0 refills | Status: DC

## 2020-09-08 MED ORDER — VALACYCLOVIR HCL 500 MG PO TABS
500.0000 mg | ORAL_TABLET | Freq: Three times a day (TID) | ORAL | 1 refills | Status: DC | PRN
Start: 2020-09-08 — End: 2020-12-09

## 2020-09-08 NOTE — Patient Instructions (Addendum)
For [place]: please try one of the following:   [place]  [phone]  [facility] [place] [phone]  In [place]  [phone]  [facility] at [facility], [address] [place], [place], [place] [address] [phone]  [facility] [address] [place], [place] [address] [phone]  [facility]  [address] [place], [place] [address] Phone # [phone] Fax: [place], [place], [place], [place] [address] Phone: [phone]  [facility] [address] [place], [place] [address] [phone]  Try this one first   Phone: [phone] Address: [place], [place] [address] Hours: Open 24/7, No appointment required  For home sleep study, you need to contact insurance to find out who you can use, after that the referral can be processed

## 2020-10-21 ENCOUNTER — Other Ambulatory Visit: Payer: Self-pay | Admitting: Family Medicine

## 2020-10-21 DIAGNOSIS — E1169 Type 2 diabetes mellitus with other specified complication: Secondary | ICD-10-CM

## 2020-12-09 ENCOUNTER — Ambulatory Visit: Payer: 59 | Admitting: Family Medicine

## 2020-12-09 ENCOUNTER — Other Ambulatory Visit: Payer: Self-pay

## 2020-12-09 ENCOUNTER — Encounter: Payer: Self-pay | Admitting: Family Medicine

## 2020-12-09 VITALS — BP 122/70 | HR 100 | Temp 97.9°F | Resp 16 | Ht 63.0 in | Wt 230.0 lb

## 2020-12-09 DIAGNOSIS — Z23 Encounter for immunization: Secondary | ICD-10-CM

## 2020-12-09 DIAGNOSIS — E1169 Type 2 diabetes mellitus with other specified complication: Secondary | ICD-10-CM

## 2020-12-09 DIAGNOSIS — E785 Hyperlipidemia, unspecified: Secondary | ICD-10-CM | POA: Diagnosis not present

## 2020-12-09 DIAGNOSIS — E538 Deficiency of other specified B group vitamins: Secondary | ICD-10-CM

## 2020-12-09 DIAGNOSIS — A6004 Herpesviral vulvovaginitis: Secondary | ICD-10-CM

## 2020-12-09 DIAGNOSIS — I10 Essential (primary) hypertension: Secondary | ICD-10-CM | POA: Diagnosis not present

## 2020-12-09 DIAGNOSIS — E669 Obesity, unspecified: Secondary | ICD-10-CM | POA: Diagnosis not present

## 2020-12-09 DIAGNOSIS — E559 Vitamin D deficiency, unspecified: Secondary | ICD-10-CM

## 2020-12-09 DIAGNOSIS — F411 Generalized anxiety disorder: Secondary | ICD-10-CM

## 2020-12-09 DIAGNOSIS — N951 Menopausal and female climacteric states: Secondary | ICD-10-CM

## 2020-12-09 DIAGNOSIS — E133299 Other specified diabetes mellitus with mild nonproliferative diabetic retinopathy without macular edema, unspecified eye: Secondary | ICD-10-CM

## 2020-12-09 LAB — POCT GLYCOSYLATED HEMOGLOBIN (HGB A1C): Hemoglobin A1C: 6.3 % — AB (ref 4.0–5.6)

## 2020-12-09 MED ORDER — TRULICITY 3 MG/0.5ML ~~LOC~~ SOAJ: 3.0000 mg | SUBCUTANEOUS | 1 refills | Status: DC

## 2020-12-09 MED ORDER — ATORVASTATIN CALCIUM 40 MG PO TABS
40.0000 mg | ORAL_TABLET | Freq: Every evening | ORAL | 1 refills | Status: DC
Start: 2020-12-09 — End: 2021-03-16

## 2020-12-09 MED ORDER — TELMISARTAN-HCTZ 80-25 MG PO TABS: 1.0000 | ORAL_TABLET | Freq: Every day | ORAL | 1 refills | Status: DC

## 2020-12-09 MED ORDER — SYNJARDY XR 25-1000 MG PO TB24: 1.0000 | ORAL_TABLET | Freq: Every day | ORAL | 0 refills | Status: DC

## 2020-12-09 MED ORDER — VALACYCLOVIR HCL 500 MG PO TABS: 500.0000 mg | ORAL_TABLET | Freq: Three times a day (TID) | ORAL | 1 refills | Status: DC | PRN

## 2020-12-09 MED ORDER — VENLAFAXINE HCL ER 150 MG PO CP24: 150.0000 mg | ORAL_CAPSULE | Freq: Every day | ORAL | 0 refills | Status: DC

## 2020-12-09 NOTE — Progress Notes (Signed)
Name: Wendy [name]   MRN: [id number]    DOB: 05-27-1960   Date:12/09/2020       Progress Note  Subjective  Chief Complaint  Follow Up  HPI  DM with nephropathy, retinopathy:  hgbA1C went up from  7.1% to 7.7% ,  7.5%., 8.1 % , 9.7 ,8.2% , 7.3%  7.6% ,8.5% down again  7.4% , 8.4 %, 7.7 % up to  8.4 %,7.1 % and today is down to 6.3 % . She has been taking Trulicity and Synjardi and is doing well.  She denies polyphagia, polydipsia or polyuria. She has retinopathy and reminded her to keep up with eye exam. She also has dyslipidemia, HTN, obesity . She lost another 12 lbs since last visit.  She is down to 230 lbs.   HTN: today she has been compliant with Telmisartan. HCTZ, bp is bat goal today at 122/70.  She does not check her blood pressure at home.  She denies any chest pain, palpitation or dizziness. Will get labs next visit.   Hyperlipidemia: she is back on Atorvastatin and last LDL was 74, 02/2020.  She denies any side effects. will get labs next visit.   Snoring: she did not go to sleep study, and she did not re-schedule it, she would like to try a home study instead. . She still feels tired daily. She knows the importance of the sleep study. She has also notices that she has to take a deep breath when sitting and watching TV, she is taking naps during the day. She states she is worried about living her son at home ( he is grown but has autism)  She has not been contacted about sleep study yet , contacted out referral coordinator and she needs to contact her insurance to find out who is covered.  She has not contacted her insurance about the home sleep study.     Extremity obesity: she lost weight , 22 lbs since she resumed Trulicity 3 months ago, she needs to avoid sodas.  She still drinks about 1 soda day, which is down from 4 a day.   Hot Flashes/Depression and GAD: she has been on Effexor, no side effects of medication , she states it helps with hot flashes, she can only tolerate effexor one  in am and one at lunch , but not 150 mg at once, but wants to try it again . She always worries about her son that has autism, he repeats himself, she gets annoyed at times, but also worries what will happen to him when she dies. She sates Buspar and takes it prn only. Still not seeing a psychiatrist   Vomiting:  she is vomiting the day after taking her Trulicity. Discussed lowering the dose, she would like to stay on the current dose, advised to take dose in the evening and eat smaller portions and also drink sips of fluids throughout the day.   Patient Active Problem List   Diagnosis Date Noted   Endometrial polyp 11/29/2017   Fibroid 11/29/2017   Post-menopausal bleeding 11/14/2017   Mild nonproliferative diabetic retinopathy of both eyes associated with type 2 diabetes mellitus ([place]) 06/09/2016   Uncontrolled type 2 diabetes mellitus with nonproliferative retinopathy ([place]) 03/01/2016   Vitamin D deficiency 07/07/2015   Vitamin B12 deficiency 07/07/2015   Snoring 12/25/2014   Allergic rhinitis, seasonal 12/14/2014   Chronic constipation 12/14/2014   Diabetes mellitus with renal manifestation ([place]) 12/14/2014   Dyslipidemia 12/14/2014   Dermatitis, eczematoid 12/14/2014  Edema extremities 12/14/2014   Essential (primary) hypertension 12/14/2014   Gastro-esophageal reflux disease without esophagitis 12/14/2014   Genital herpes in women 12/14/2014   Morbid obesity ([place]) 12/14/2014   Female climacteric state 12/14/2014    Past Surgical History:  Procedure Laterality Date   APPLICATION OF WOUND VAC  04/2018   CESAREAN SECTION  1993   LAPAROTOMY N/A 05/14/2018   Procedure: EXPLORATORY LAPAROTOMY;  Surgeon: Georganna Skeans, MD;  Location: [facility];  Service: General;  Laterality: N/A;   LAPAROTOMY N/A 05/16/2018   Procedure: EXPLORATORY LAPAROTOMY with reanatomosis and fascia closure with wound vacum application;  Surgeon: Erroll Luna, MD;  Location: [facility];  Service: General;   Laterality: N/A;   tonsillectomy and adnoidectomy     WISDOM TOOTH EXTRACTION      Family History  Problem Relation Age of Onset   Diabetes Mother    Asthma Mother    Heart disease Mother    Heart disease Father    Prostate cancer Father    Other Father        blockages in stomach   Kidney disease Paternal Grandmother    Asthma Son    Autism Son    Breast cancer Neg Hx    Colon cancer Neg Hx    Esophageal cancer Neg Hx    Pancreatic cancer Neg Hx    Rectal cancer Neg Hx    Stomach cancer Neg Hx     Social History   Tobacco Use   Smoking status: Former    Years: 20.00    Types: Cigarettes    Start date: 04/19/1971    Quit date: 04/2018    Years since quitting: 2.6   Smokeless tobacco: Never   Tobacco comments:    she quit and started back October 2019, but quit again 04/2018  Substance Use Topics   Alcohol use: No    Alcohol/week: 0.0 standard drinks     Current Outpatient Medications:    acetaminophen (TYLENOL) 325 MG tablet, Take 2 tablets (650 mg total) by mouth every 6 (six) hours as needed for mild pain or headache., Disp: 25 tablet, Rfl: 0   atorvastatin (LIPITOR) 40 MG tablet, Take 1 tablet (40 mg total) by mouth every evening., Disp: 90 tablet, Rfl: 1   blood glucose meter kit and supplies, Dispense based on patient and insurance preference. Use up to four times daily as directed. (FOR ICD-10 E10.9, E11.9)., Disp: 1 each, Rfl: 2   Blood Glucose Monitoring Suppl (ONE TOUCH ULTRA 2) w/Device KIT, 1 kit by Subdermal route See admin instructions., Disp: 1 each, Rfl: 0   busPIRone (BUSPAR) 5 MG tablet, Take 1 tablet (5 mg total) by mouth 2 (two) times daily., Disp: 180 tablet, Rfl: 0   Cholecalciferol (VITAMIN D) 2000 units CAPS, Take 1 capsule (2,000 Units total) by mouth daily., Disp: 30 capsule, Rfl: 0   Dulaglutide (TRULICITY) 3 ZO/1.0RU SOPN, Inject 3 mg as directed once a week., Disp: 6 mL, Rfl: 0   Empagliflozin-metFORMIN HCl ER (SYNJARDY XR) 25-1000 MG TB24,  Take 1 tablet by mouth daily., Disp: 90 tablet, Rfl: 1   glucose blood (ONE TOUCH ULTRA TEST) test strip, CHECK FASTING BLOOD SUGARS TWICE DAILY, Disp: 100 each, Rfl: 2   ONETOUCH DELICA LANCETS FINE MISC, See admin instructions., Disp: , Rfl: 0   telmisartan-hydrochlorothiazide (MICARDIS HCT) 80-25 MG tablet, Take 1 tablet by mouth daily., Disp: 90 tablet, Rfl: 1   valACYclovir (VALTREX) 500 MG tablet, Take 1 tablet (500 mg  total) by mouth 3 (three) times daily as needed (for prevention). And daily for prevention, Disp: 100 tablet, Rfl: 1   venlafaxine XR (EFFEXOR-XR) 150 MG 24 hr capsule, Take 1 capsule (150 mg total) by mouth daily with breakfast., Disp: 90 capsule, Rfl: 0   Vitamin D, Ergocalciferol, (DRISDOL) 1.25 MG (50000 UNIT) CAPS capsule, Take 1 capsule (50,000 Units total) by mouth every 7 (seven) days., Disp: 12 capsule, Rfl: 1   doxycycline (VIBRA-TABS) 100 MG tablet, Take 1 tablet (100 mg total) by mouth 2 (two) times daily. (Patient not taking: Reported on 12/09/2020), Disp: 20 tablet, Rfl: 0  Allergies  Allergen Reactions   Penicillins Swelling    Did it involve swelling of the face/tongue/throat, SOB, or low BP? Yes Did it involve sudden or severe rash/hives, skin peeling, or any reaction on the inside of your mouth or nose? No Did you need to seek medical attention at a hospital or doctor's office? Yes When did it last happen? unknown If all above answers are "NO", may proceed with cephalosporin use.     I personally reviewed active problem list, medication list, allergies, family history, social history, health maintenance with the patient/caregiver today.   ROS  Constitutional: Negative for fever or weight change.  Respiratory: Negative for cough and shortness of breath.   Cardiovascular: Negative for chest pain or palpitations.  Gastrointestinal: Negative for abdominal pain, no bowel changes, positive for vomiting Musculoskeletal: Negative for gait problem or joint  swelling.  Skin: Negative for rash.  Neurological: Negative for dizziness or headache.  No other specific complaints in a complete review of systems (except as listed in HPI above).   Objective  Vitals:   12/09/20 1444  BP: 122/70  Pulse: 100  Resp: 16  Temp: 97.9 F (36.6 C)  SpO2: 98%  Weight: 230 lb (104.3 kg)  Height: 5' 3" (1.6 m)    Body mass index is 40.74 kg/m.  Physical Exam  Constitutional: Patient appears well-developed and well-nourished. Obese  No distress.  HEENT: head atraumatic, normocephalic, pupils equal and reactive to light, neck supple Cardiovascular: Normal rate, regular rhythm and normal heart sounds.  No murmur heard. No BLE edema. Pulmonary/Chest: Effort normal and breath sounds normal. No respiratory distress. Abdominal: Soft.  There is no tenderness. Psychiatric: Patient has a normal mood and affect. behavior is normal. Judgment and thought content normal.   PHQ2/9: Depression screen [facility] 2/9 12/09/2020 09/08/2020 06/09/2020 03/04/2020 03/04/2020  Decreased Interest _0 Down, Depressed, Hopeless 0 _1 PHQ - 2 Score _2 Altered sleeping 0 3 0 3 -  Tired, decreased energy 3 3 0 3 -  Change in appetite 3 0 0 0 -  Feeling bad or failure about yourself  0 2 0 0 -  Trouble concentrating 0 0 0 0 -  Moving slowly or fidgety/restless 0 1 0 0 -  Suicidal thoughts 0 0 0 0 -  PHQ-9 Score _3 -  Difficult doing work/chores - Very difficult - Very difficult -  Some recent data might be hidden    phq 9 is positive   Fall Risk: Fall Risk  12/09/2020 09/08/2020 06/09/2020 03/04/2020 10/15/2019  Falls in the past year? 0 0 0 1 0  Number falls in past yr: 0 0 0 1 0  Injury with Fall? 0 0 0 0 0  Risk for fall due to : No Fall Risks - - History  of fall(s) -  Follow up Falls prevention discussed Falls prevention discussed - - -      Functional Status Survey: Is the patient deaf or have difficulty hearing?: No Does the patient have  difficulty seeing, even when wearing glasses/contacts?: No Does the patient have difficulty concentrating, remembering, or making decisions?: No Does the patient have difficulty walking or climbing stairs?: No Does the patient have difficulty dressing or bathing?: No Does the patient have difficulty doing errands alone such as visiting a doctor's office or shopping?: No    Assessment & Plan  1. Diabetes mellitus type 2 in obese (HCC)  - POCT HgB A1C - Dulaglutide (TRULICITY) 3 ZO/1.0RU SOPN; Inject 3 mg as directed once a week.  Dispense: 6 mL; Refill: 1  2. Essential (primary) hypertension  - telmisartan-hydrochlorothiazide (MICARDIS HCT) 80-25 MG tablet; Take 1 tablet by mouth daily.  Dispense: 90 tablet; Refill: 1  3. Morbid obesity ([place])   4. Dyslipidemia  - atorvastatin (LIPITOR) 40 MG tablet; Take 1 tablet (40 mg total) by mouth every evening.  Dispense: 90 tablet; Refill: 1  5. Vitamin B12 deficiency   6. Vitamin D deficiency   7. Nonproliferative retinopathy due to secondary diabetes mellitus (HCC)  - Empagliflozin-metFORMIN HCl ER (SYNJARDY XR) 25-1000 MG TB24; Take 1 tablet by mouth daily.  Dispense: 90 tablet; Refill: 0  8. GAD (generalized anxiety disorder)  - venlafaxine XR (EFFEXOR-XR) 150 MG 24 hr capsule; Take 1 capsule (150 mg total) by mouth daily with breakfast.  Dispense: 90 capsule; Refill: 0  9. Female climacteric state  - venlafaxine XR (EFFEXOR-XR) 150 MG 24 hr capsule; Take 1 capsule (150 mg total) by mouth daily with breakfast.  Dispense: 90 capsule; Refill: 0  10. Need for shingles vaccine  - Varicella-zoster vaccine IM (Shingrix)  11. Herpes simplex vulvovaginitis  - valACYclovir (VALTREX) 500 MG tablet; Take 1 tablet (500 mg total) by mouth 3 (three) times daily as needed (for prevention). And daily for prevention  Dispense: 100 tablet; Refill: 1   I,Julie F Pender,acting as a scribe for Loistine Chance, MD.,have documented all  relevant documentation on the behalf of Loistine Chance, MD,as directed by  Loistine Chance, MD while in the presence of Loistine Chance, MD.

## 2020-12-24 ENCOUNTER — Other Ambulatory Visit: Payer: Self-pay | Admitting: Nurse Practitioner

## 2020-12-24 DIAGNOSIS — A6004 Herpesviral vulvovaginitis: Secondary | ICD-10-CM

## 2021-01-16 ENCOUNTER — Other Ambulatory Visit: Payer: Self-pay | Admitting: Family Medicine

## 2021-01-16 DIAGNOSIS — E1169 Type 2 diabetes mellitus with other specified complication: Secondary | ICD-10-CM

## 2021-01-16 NOTE — Telephone Encounter (Signed)
Spoke with Allie at [facility] and med at this dose will be deactivated

## 2021-01-27 ENCOUNTER — Encounter: Payer: Self-pay | Admitting: Family Medicine

## 2021-01-27 ENCOUNTER — Telehealth (INDEPENDENT_AMBULATORY_CARE_PROVIDER_SITE_OTHER): Payer: 59 | Admitting: Family Medicine

## 2021-01-27 VITALS — HR 86 | Temp 98.4°F | Resp 16 | Ht 63.0 in | Wt 230.0 lb

## 2021-01-27 DIAGNOSIS — R197 Diarrhea, unspecified: Secondary | ICD-10-CM | POA: Diagnosis not present

## 2021-01-27 DIAGNOSIS — J069 Acute upper respiratory infection, unspecified: Secondary | ICD-10-CM | POA: Diagnosis not present

## 2021-01-27 DIAGNOSIS — R109 Unspecified abdominal pain: Secondary | ICD-10-CM

## 2021-01-27 DIAGNOSIS — B349 Viral infection, unspecified: Secondary | ICD-10-CM | POA: Diagnosis not present

## 2021-01-27 DIAGNOSIS — K529 Noninfective gastroenteritis and colitis, unspecified: Secondary | ICD-10-CM

## 2021-01-27 DIAGNOSIS — R051 Acute cough: Secondary | ICD-10-CM

## 2021-01-27 DIAGNOSIS — R55 Syncope and collapse: Secondary | ICD-10-CM

## 2021-01-27 MED ORDER — DICYCLOMINE HCL 20 MG PO TABS: 20.0000 mg | ORAL_TABLET | Freq: Three times a day (TID) | ORAL | 0 refills | Status: DC | PRN

## 2021-01-27 MED ORDER — ONDANSETRON 4 MG PO TBDP: 4.0000 mg | ORAL_TABLET | Freq: Three times a day (TID) | ORAL | 0 refills | Status: DC | PRN

## 2021-01-27 NOTE — Patient Instructions (Signed)
Go to the ER or urgent care if you worsen - pass out, have trouble breathing, have new chest pain.  You can try bentyl for your stomach cramping and zofran for nausea - sometimes it helps settle the stomach I recommend pushing clear fluids first and simple/bland foods like crackers and slowly advance diet.  Avoid milk, meat, heavy/greasy/fried/spicy foods.    See the info below  I would isolate until you start to feel better. I ordered labs and a chest xray if you don't start getting better in the next 1-2 days I would complete them at the hospital.  Diarrhea, Adult Diarrhea is when you pass loose and watery poop (stool) often. Diarrhea can make you feel weak and cause you to lose water in your body (get dehydrated). Losing water in your body can cause you to: Feel tired and thirsty. Have a dry mouth. Go pee (urinate) less often. Diarrhea often lasts 2-3 days. However, it can last longer if it is a sign of something more serious. It is important to treat your diarrhea as told by your doctor. Follow these instructions at home: Eating and drinking   Follow these instructions as told by your doctor: Take an ORS (oral rehydration solution). This is a drink that helps you replace fluids and minerals your body lost. It is sold at pharmacies and stores. Drink plenty of fluids, such as: Water. Ice chips. Diluted fruit juice. Low-calorie sports drinks. Milk, if you want. Avoid drinking fluids that have a lot of sugar or caffeine in them. Eat bland, easy-to-digest foods in small amounts as you are able. These foods include: Bananas. Applesauce. Rice. Low-fat (lean) meats. Toast. Crackers. Avoid alcohol. Avoid spicy or fatty foods.  Medicines Take over-the-counter and prescription medicines only as told by your doctor. If you were prescribed an antibiotic medicine, take it as told by your doctor. Do not stop using the antibiotic even if you start to feel better. General  instructions  Wash your hands often using soap and water. If soap and water are not available, use a hand sanitizer. Others in your home should wash their hands as well. Hands should be washed: After using the toilet or changing a diaper. Before preparing, cooking, or serving food. While caring for a sick person. While visiting someone in a hospital. Drink enough fluid to keep your pee (urine) pale yellow. Rest at home while you get better. Watch your condition for any changes. Take a warm bath to help with any burning or pain from having diarrhea. Keep all follow-up visits as told by your doctor. This is important. Contact a doctor if: You have a fever. Your diarrhea gets worse. You have new symptoms. You cannot keep fluids down. You feel light-headed or dizzy. You have a headache. You have muscle cramps. Get help right away if: You have chest pain. You feel very weak or you pass out (faint). You have bloody or black poop or poop that looks like tar. You have very bad pain, cramping, or bloating in your belly (abdomen). You have trouble breathing or you are breathing very quickly. Your heart is beating very quickly. Your skin feels cold and clammy. You feel confused. You have signs of losing too much water in your body, such as: Dark pee, very little pee, or no pee. Cracked lips. Dry mouth. Sunken eyes. Sleepiness. Weakness. Summary Diarrhea is when you pass loose and watery poop (stool) often. Diarrhea can make you feel weak and cause you to lose water in  your body (get dehydrated). Take an ORS (oral rehydration solution). This is a drink that is sold at pharmacies and stores. Eat bland, easy-to-digest foods in small amounts as you are able. Contact a doctor if your condition gets worse. Get help right away if you have signs that you have lost too much water in your body. This information is not intended to replace advice given to you by your health care provider. Make  sure you discuss any questions you have with your health care provider. Document Revised: 09/08/2017 Document Reviewed: 09/08/2017 Elsevier Patient Education  2022 [place].  Viral Respiratory Infection A viral respiratory infection is an illness that affects parts of the body that are used for breathing. These include the lungs, nose, and throat. It is caused by a germ called a virus. Some examples of this kind of infection are: A cold. The flu (influenza). A respiratory syncytial virus (RSV) infection. What are the causes? This condition is caused by a virus. It spreads from person to person. You can get the virus if: You breathe in droplets from someone who is sick. You come in contact with people who are sick. You touch mucus or other fluid from a person who is sick. What are the signs or symptoms? Symptoms of this condition include: A stuffy or runny nose. A sore throat. A cough. Shortness of breath. Trouble breathing. Yellow or green fluid in the nose. Other symptoms may include: A fever. Sweating or chills. Tiredness (fatigue). Achy muscles. A headache. How is this treated? This condition may be treated with: Medicines that treat viruses. Medicines that make it easy to breathe. Medicines that are sprayed into the nose. Acetaminophen or NSAIDs, such as ibuprofen, to treat fever. Follow these instructions at home: Managing pain and congestion Take over-the-counter and prescription medicines only as told by your doctor. If you have a sore throat, gargle with salt water. Do this 3-4 times a day or as needed. To make salt water, dissolve -1 tsp (3-6 g) of salt in 1 cup (237 mL) of warm water. Make sure that all the salt dissolves. Use nose drops made from salt water. This helps with stuffiness (congestion). It also helps soften the skin around your nose. Take 2 tsp (10 mL) of honey at bedtime to lessen coughing at night. Do not give honey to children who are younger  than 19 year old. Drink enough fluid to keep your pee (urine) pale yellow. General instructions  Rest as much as possible. Do not drink alcohol. Do not smoke or use any products that contain nicotine or tobacco. If you need help quitting, ask your doctor. Keep all follow-up visits. How is this prevented?   Get a flu shot every year. Ask your doctor when you should get your flu shot. Do not let other people get your germs. If you are sick: Wash your hands with soap and water often. Wash your hands after you cough or sneeze. Wash hands for at least 20 seconds. If you cannot use soap and water, use hand sanitizer. Cover your mouth when you cough. Cover your nose and mouth when you sneeze. Do not share cups or eating utensils. Clean commonly used objects often. Clean commonly touched surfaces. Stay home from work or school. Avoid contact with people who are sick during cold and flu season. This is in fall and winter. Get help if: Your symptoms last for 10 days or longer. Your symptoms get worse over time. You have very bad pain in  your face or forehead. Parts of your jaw or neck get very swollen. You have shortness of breath. Get help right away if: You feel pain or pressure in your chest. You have trouble breathing. You faint or feel like you will faint. You keep vomiting and it gets worse. You feel confused. These symptoms may be an emergency. Get help right away. Call your local emergency services (911 in the [place] Do not wait to see if the symptoms will go away. Do not drive yourself to the hospital. Summary A viral respiratory infection is an illness that affects parts of the body that are used for breathing. Examples of this illness include a cold, the flu, and a respiratory syncytial virus (RSV) infection. The infection can cause a runny nose, cough, sore throat, and fever. Follow what your doctor tells you about taking medicines, drinking lots of fluid, washing your hands,  resting at home, and avoiding people who are sick. This information is not intended to replace advice given to you by your health care provider. Make sure you discuss any questions you have with your health care provider. Document Revised: 07/09/2020 Document Reviewed: 07/09/2020 Elsevier Patient Education  [employer].

## 2021-01-27 NOTE — Progress Notes (Signed)
Name: Wendy [name]   MRN: [id number]    DOB: Sep 29, 1960   Date:01/27/2021       Progress Note  Subjective:    Chief Complaint  Chief Complaint  Patient presents with   Cough    Cold sx    I connected with  Bailey Mech on 01/27/21 at  2:00 PM EDT by telephone and verified that I am speaking with the correct person using two identifiers.   I discussed the limitations, risks, security and privacy concerns of performing an evaluation and management service by telephone and the availability of in person appointments. Staff also discussed with the patient that there may be a patient responsible charge related to this service.  Patient verbalized understanding and agreed to proceed with encounter. Patient Location: car in our parking lot Provider Location: [facility] Additional Individuals present: none  HPI Sx onset last week Cold like sx, body aches, bones ache, coughing productive with phlegm  She has cramping after eating and diarrhea No fever, SOB, CP Near syncope today  Hx of DM , blood sugar normal she states 120's Feels generally fatigued, tired, hot/cold chills and sweats     Patient Active Problem List   Diagnosis Date Noted   Endometrial polyp 11/29/2017   Fibroid 11/29/2017   Post-menopausal bleeding 11/14/2017   Mild nonproliferative diabetic retinopathy of both eyes associated with type 2 diabetes mellitus ([place]) 06/09/2016   Uncontrolled type 2 diabetes mellitus with nonproliferative retinopathy 03/01/2016   Vitamin D deficiency 07/07/2015   Vitamin B12 deficiency 07/07/2015   Snoring 12/25/2014   Allergic rhinitis, seasonal 12/14/2014   Chronic constipation 12/14/2014   Diabetes mellitus with renal manifestation ([place]) 12/14/2014   Dyslipidemia 12/14/2014   Dermatitis, eczematoid 12/14/2014   Edema extremities 12/14/2014   Essential (primary) hypertension 12/14/2014   Gastro-esophageal reflux disease without esophagitis 12/14/2014   Genital herpes in women  12/14/2014   Morbid obesity ([place]) 12/14/2014   Female climacteric state 12/14/2014    Social History   Tobacco Use   Smoking status: Former    Years: 20.00    Types: Cigarettes    Start date: 04/19/1971    Quit date: 04/2018    Years since quitting: 2.7   Smokeless tobacco: Never   Tobacco comments:    she quit and started back October 2019, but quit again 04/2018  Substance Use Topics   Alcohol use: No    Alcohol/week: 0.0 standard drinks     Current Outpatient Medications:    acetaminophen (TYLENOL) 325 MG tablet, Take 2 tablets (650 mg total) by mouth every 6 (six) hours as needed for mild pain or headache., Disp: 25 tablet, Rfl: 0   atorvastatin (LIPITOR) 40 MG tablet, Take 1 tablet (40 mg total) by mouth every evening., Disp: 90 tablet, Rfl: 1   blood glucose meter kit and supplies, Dispense based on patient and insurance preference. Use up to four times daily as directed. (FOR ICD-10 E10.9, E11.9)., Disp: 1 each, Rfl: 2   Blood Glucose Monitoring Suppl (ONE TOUCH ULTRA 2) w/Device KIT, 1 kit by Subdermal route See admin instructions., Disp: 1 each, Rfl: 0   busPIRone (BUSPAR) 5 MG tablet, Take 1 tablet (5 mg total) by mouth 2 (two) times daily., Disp: 180 tablet, Rfl: 0   Cholecalciferol (VITAMIN D) 2000 units CAPS, Take 1 capsule (2,000 Units total) by mouth daily., Disp: 30 capsule, Rfl: 0   Dulaglutide (TRULICITY) 3 FT/7.3UK SOPN, Inject 3 mg as directed once a week., Disp: 6  mL, Rfl: 1   Empagliflozin-metFORMIN HCl ER (SYNJARDY XR) 25-1000 MG TB24, Take 1 tablet by mouth daily., Disp: 90 tablet, Rfl: 0   glucose blood (ONE TOUCH ULTRA TEST) test strip, CHECK FASTING BLOOD SUGARS TWICE DAILY, Disp: 100 each, Rfl: 2   ONETOUCH DELICA LANCETS FINE MISC, See admin instructions., Disp: , Rfl: 0   telmisartan-hydrochlorothiazide (MICARDIS HCT) 80-25 MG tablet, Take 1 tablet by mouth daily., Disp: 90 tablet, Rfl: 1   valACYclovir (VALTREX) 500 MG tablet, TAKE 1 TABLET BY MOUTH 3  TIMES A DAY AS NEEDED FOR PREVENTION.AND DAILY FOR PREVENTION, Disp: 300 tablet, Rfl: 1   venlafaxine XR (EFFEXOR-XR) 150 MG 24 hr capsule, Take 1 capsule (150 mg total) by mouth daily with breakfast., Disp: 90 capsule, Rfl: 0   Vitamin D, Ergocalciferol, (DRISDOL) 1.25 MG (50000 UNIT) CAPS capsule, Take 1 capsule (50,000 Units total) by mouth every 7 (seven) days., Disp: 12 capsule, Rfl: 1  Allergies  Allergen Reactions   Penicillins Swelling    Did it involve swelling of the face/tongue/throat, SOB, or low BP? Yes Did it involve sudden or severe rash/hives, skin peeling, or any reaction on the inside of your mouth or nose? No Did you need to seek medical attention at a hospital or doctor's office? Yes When did it last happen? unknown If all above answers are "NO", may proceed with cephalosporin use.     Chart Review: I personally reviewed active problem list, medication list, allergies, family history, social history, health maintenance, notes from last encounter, lab results, imaging with the patient/caregiver today.   Review of Systems  Constitutional: Negative.   HENT: Negative.    Eyes: Negative.   Respiratory: Negative.    Cardiovascular: Negative.   Gastrointestinal: Negative.   Endocrine: Negative.   Genitourinary: Negative.   Musculoskeletal: Negative.   Skin: Negative.   Allergic/Immunologic: Negative.   Neurological: Negative.   Hematological: Negative.   Psychiatric/Behavioral: Negative.    All other systems reviewed and are negative.   Objective:    Virtual encounter, vitals limited, only able to obtain the following Today's Vitals   01/27/21 1435  Pulse: 86  Resp: 16  Temp: 98.4 F (36.9 C)  TempSrc: Oral  SpO2: 99%  Weight: 230 lb (104.3 kg)  Height: 5' 3" (1.6 m)   Body mass index is 40.74 kg/m. Nursing Note and Vital Signs reviewed.  Physical Exam Vitals and nursing note reviewed.  Pulmonary:     Effort: No respiratory distress.   Neurological:     Mental Status: She is alert.    PE limited by telephone encounter  No results found for this or any previous visit (from the past 72 hour(s)).  Assessment and Plan:     ICD-10-CM   1. Viral illness  B34.9 CBC with Differential/Platelet    Urinalysis, Routine w reflex microscopic    Comprehensive metabolic panel    DG Chest 2 View    dicyclomine (BENTYL) 20 MG tablet    ondansetron (ZOFRAN ODT) 4 MG disintegrating tablet    2. Upper respiratory tract infection, unspecified type  J06.9 DG Chest 2 View    Novel Coronavirus, NAA ([facility])    3. Gastroenteritis, acute  K52.9 Comprehensive metabolic panel    ondansetron (ZOFRAN ODT) 4 MG disintegrating tablet    4. Diarrhea, unspecified type  R19.7 Comprehensive metabolic panel    Novel Coronavirus, NAA ([facility])    5. Near syncope  R55 CBC with Differential/Platelet    Urinalysis, Routine w reflex  microscopic    Comprehensive metabolic panel    DG Chest 2 View    Novel Coronavirus, NAA ([facility])    6. Acute cough  R05.1 DG Chest 2 View    Novel Coronavirus, NAA ([facility])    7. Abdominal cramping  R10.9 dicyclomine (BENTYL) 20 MG tablet    Onset of sx more than a week ago - not a candidate for COVID or flu treatment if positive - covid test was done today by staff  Asked for VS including BP HR pulse ox, VS obtained w/o BP - pt noted she was lightheaded today - will need to monitor BP at home - go to UC if too low or near syncope/syncope, may hold BP med (micardis) if low - suspect some dehydration Still feeling generally ill, but no concerning sx which require ER visit today Isolate until tests return Push fluids, slowly advance diet Can do labs if she would like If abd pain, SOB, CP, syncope would need to be seen in person UC or here or ED (whichever is appropraite) AVS was documented for her, staff asked to review with her and give copy.    -Red flags and when to present for emergency care or RTC  including but not limited to new/worsening/un-resolving symptoms, reviewed with patient at time of visit. Follow up and care instructions discussed and provided in AVS. - I discussed the assessment and treatment plan with the patient. The patient was provided an opportunity to ask questions and all were answered. The patient agreed with the plan and demonstrated an understanding of the instructions.  - The patient was advised to call back or seek an in-person evaluation if the symptoms worsen or if the condition fails to improve as anticipated.  I provided 18 minutes of non-face-to-face time during this encounter.  Delsa Grana, PA-C 01/27/21 2:10 PM

## 2021-01-29 LAB — SPECIMEN STATUS REPORT

## 2021-01-29 LAB — NOVEL CORONAVIRUS, NAA: SARS-CoV-2, NAA: DETECTED — AB

## 2021-01-29 LAB — SARS-COV-2, NAA 2 DAY TAT

## 2021-03-09 NOTE — Progress Notes (Signed)
Name: Wendy [name]   MRN: [id number]    DOB: [birth year]   Date:03/10/2021       Progress Note  Subjective  Chief Complaint  Follow up  HPI  DM with nephropathy, retinopathy:  hgbA1C went up from  7.1% to 7.7% ,  7.5%., 8.1 % , 9.7 ,8.2% , 7.3%  7.6% ,8.5%, 7.4% , 8.4 %, 7.7 % up to  8.4 %,7.1 %  6.3 % and today is down to 6 % . She has been taking Trulicity and Synjardi and is doing well, she is also following a healthier diet. .  She denies polyphagia, polydipsia or polyuria. She has retinopathy and reminded her to keep up with eye exam. She also has dyslipidemia, HTN, obesity . She lost another 7 lbs and is now 223.3 lbs.Weight used to be in the 250 lbs when we stopped insulin and increased dose of GLP-1 agonist   HTN: today she has been compliant with Telmisartan. HCTZ, bp has been at goal.    She denies any chest pain, palpitation or dizziness.    Hyperlipidemia: she is back on Atorvastatin and last LDL was 74, 02/2020.  She denies any side effects. will get labs next visit.  AR: she has nasal congestion, rhinorrhea but no cough or wheezing , she would like medication for it    Snoring: she did not go to sleep study, and she did not re-schedule it, she would like to try a home study instead. . She still feels tired daily. She knows the importance of the sleep study. She has also notices that she has to take a deep breath when sitting and watching TV, she is taking naps during the day. She states she is worried about living her son at home ( he is grown but has autism)  She has not been contacted about sleep study yet , contacted out referral coordinator and she needs to contact her insurance to find out who is covered.  She has not contacted her insurance about the home sleep study.     Extremity obesity: she lost weight ,she is still losing weight. Eating healthier but still drinking sodas    Hot Flashes/Depression and GAD: she has been on Effexor, no side effects of medication , she states  it helps with hot flashes, she can only tolerate effexor one in am and one at lunch , but not 150 mg  because it makes her feel sick . She states once a day causes nausea    Patient Active Problem List   Diagnosis Date Noted   Endometrial polyp 11/29/2017   Fibroid 11/29/2017   Post-menopausal bleeding 11/14/2017   Mild nonproliferative diabetic retinopathy of both eyes associated with type 2 diabetes mellitus ([place]) 06/09/2016   Vitamin D deficiency 07/07/2015   Vitamin B12 deficiency 07/07/2015   Snoring 12/25/2014   Allergic rhinitis, seasonal 12/14/2014   Chronic constipation 12/14/2014   Diabetes mellitus with renal manifestation ([place]) 12/14/2014   Dyslipidemia 12/14/2014   Dermatitis, eczematoid 12/14/2014   Edema extremities 12/14/2014   Essential (primary) hypertension 12/14/2014   Gastro-esophageal reflux disease without esophagitis 12/14/2014   Genital herpes in women 12/14/2014   Morbid obesity ([place]) 12/14/2014   Female climacteric state 12/14/2014    Past Surgical History:  Procedure Laterality Date   APPLICATION OF WOUND VAC  04/2018   CESAREAN SECTION  1993   LAPAROTOMY N/A 05/14/2018   Procedure: EXPLORATORY LAPAROTOMY;  Surgeon: Georganna Skeans, MD;  Location: [facility];  Service: General;  Laterality: N/A;   LAPAROTOMY N/A 05/16/2018   Procedure: EXPLORATORY LAPAROTOMY with reanatomosis and fascia closure with wound vacum application;  Surgeon: Erroll Luna, MD;  Location: [facility];  Service: General;  Laterality: N/A;   tonsillectomy and adnoidectomy     WISDOM TOOTH EXTRACTION      Family History  Problem Relation Age of Onset   Diabetes Mother    Asthma Mother    Heart disease Mother    Heart disease Father    Prostate cancer Father    Other Father        blockages in stomach   Kidney disease Paternal Grandmother    Asthma Son    Autism Son    Breast cancer Neg Hx    Colon cancer Neg Hx    Esophageal cancer Neg Hx    Pancreatic cancer Neg Hx    Rectal  cancer Neg Hx    Stomach cancer Neg Hx     Social History   Tobacco Use   Smoking status: Former    Years: 20.00    Types: Cigarettes    Start date: 04/19/1971    Quit date: 04/2018    Years since quitting: 2.8   Smokeless tobacco: Never   Tobacco comments:    she quit and started back October 2019, but quit again 04/2018  Substance Use Topics   Alcohol use: No    Alcohol/week: 0.0 standard drinks     Current Outpatient Medications:    acetaminophen (TYLENOL) 325 MG tablet, Take 2 tablets (650 mg total) by mouth every 6 (six) hours as needed for mild pain or headache., Disp: 25 tablet, Rfl: 0   atorvastatin (LIPITOR) 40 MG tablet, Take 1 tablet (40 mg total) by mouth every evening., Disp: 90 tablet, Rfl: 1   blood glucose meter kit and supplies, Dispense based on patient and insurance preference. Use up to four times daily as directed. (FOR ICD-10 E10.9, E11.9)., Disp: 1 each, Rfl: 2   Blood Glucose Monitoring Suppl (ONE TOUCH ULTRA 2) w/Device KIT, 1 kit by Subdermal route See admin instructions., Disp: 1 each, Rfl: 0   busPIRone (BUSPAR) 5 MG tablet, Take 1 tablet (5 mg total) by mouth 2 (two) times daily., Disp: 180 tablet, Rfl: 0   Cholecalciferol (VITAMIN D) 2000 units CAPS, Take 1 capsule (2,000 Units total) by mouth daily., Disp: 30 capsule, Rfl: 0   glucose blood (ONE TOUCH ULTRA TEST) test strip, CHECK FASTING BLOOD SUGARS TWICE DAILY, Disp: 100 each, Rfl: 2   levocetirizine (XYZAL) 5 MG tablet, Take 1 tablet (5 mg total) by mouth every evening., Disp: 90 tablet, Rfl: 1   ONETOUCH DELICA LANCETS FINE MISC, See admin instructions., Disp: , Rfl: 0   valACYclovir (VALTREX) 500 MG tablet, TAKE 1 TABLET BY MOUTH 3 TIMES A DAY AS NEEDED FOR PREVENTION.AND DAILY FOR PREVENTION, Disp: 300 tablet, Rfl: 1   dicyclomine (BENTYL) 20 MG tablet, Take 1 tablet (20 mg total) by mouth 3 (three) times daily as needed for spasms (abd cramping). (Patient not taking: Reported on 03/10/2021), Disp:  30 tablet, Rfl: 0   Dulaglutide (TRULICITY) 3 FT/7.3UK SOPN, Inject 3 mg as directed once a week., Disp: 6 mL, Rfl: 1   Empagliflozin-metFORMIN HCl ER (SYNJARDY XR) 25-1000 MG TB24, Take 1 tablet by mouth daily., Disp: 90 tablet, Rfl: 1   telmisartan-hydrochlorothiazide (MICARDIS HCT) 80-25 MG tablet, Take 1 tablet by mouth daily., Disp: 90 tablet, Rfl: 1   venlafaxine XR (EFFEXOR XR) 75 MG 24  hr capsule, Take 1 capsule (75 mg total) by mouth 2 (two) times daily with breakfast and lunch., Disp: 180 capsule, Rfl: 0   Vitamin D, Ergocalciferol, (DRISDOL) 1.25 MG (50000 UNIT) CAPS capsule, Take 1 capsule (50,000 Units total) by mouth every 7 (seven) days., Disp: 12 capsule, Rfl: 1  Allergies  Allergen Reactions   Penicillins Swelling    Did it involve swelling of the face/tongue/throat, SOB, or low BP? Yes Did it involve sudden or severe rash/hives, skin peeling, or any reaction on the inside of your mouth or nose? No Did you need to seek medical attention at a hospital or doctor's office? Yes When did it last happen? unknown If all above answers are "NO", may proceed with cephalosporin use.     I personally reviewed active problem list, medication list, allergies, family history, social history, health maintenance with the patient/caregiver today.   ROS  Constitutional: Negative for fever or weight change.  Respiratory: Negative for cough and shortness of breath.   Cardiovascular: Negative for chest pain or palpitations.  Gastrointestinal: Negative for abdominal pain, no bowel changes.  Musculoskeletal: Negative for gait problem or joint swelling.  Skin: Negative for rash.  Neurological: Negative for dizziness or headache.  No other specific complaints in a complete review of systems (except as listed in HPI above).   Objective  Vitals:   03/10/21 1425  BP: 132/84  Pulse: 94  Resp: 18  Temp: 97.9 F (36.6 C)  TempSrc: Oral  SpO2: 99%  Weight: 223 lb 4.8 oz (101.3 kg)   Height: 5' 3"  (1.6 m)    Body mass index is 39.56 kg/m.  Physical Exam  Constitutional: Patient appears well-developed and well-nourished. Obese  No distress.  HEENT: head atraumatic, normocephalic, pupils equal and reactive to light, neck supple Cardiovascular: Normal rate, regular rhythm and normal heart sounds.  No murmur heard. No BLE edema. Pulmonary/Chest: Effort normal and breath sounds normal. No respiratory distress. Abdominal: Soft.  There is no tenderness. Psychiatric: Patient has a normal mood and affect. behavior is normal. Judgment and thought content normal.   Recent Results (from the past 2160 hour(s))  Novel Coronavirus, NAA ([facility])     Status: Abnormal   Collection Time: 01/28/21 12:00 AM   Specimen: Nasopharyngeal(NP) swabs in vial transport medium   Nasopharynge  Previous  Result Value Ref Range   SARS-CoV-2, NAA Detected (A) Not Detected    Comment: Patients who have a positive COVID-19 test result may now have treatment options. Treatment options are available for patients with mild to moderate symptoms and for hospitalized patients. Visit our website at [email or web address] for resources and information. This nucleic acid amplification test was developed and its performance characteristics determined by [employer]. Nucleic acid amplification tests include RT-PCR and TMA. This test has not been FDA cleared or approved. This test has been authorized by FDA under an Emergency Use Authorization (EUA). This test is only authorized for the duration of time the declaration that circumstances exist justifying the authorization of the emergency use of in vitro diagnostic tests for detection of SARS-CoV-2 virus and/or diagnosis of COVID-19 infection under section 564(b)(1) of the Act, 21 U.S.C. 983JAS-5(K) (1), unless the authorization is terminated or revoked sooner. When diagnostic testing is negativ e, the possibility of a false negative  result should be considered in the context of a patient's recent exposures and the presence of clinical signs and symptoms consistent with COVID-19. An individual without symptoms of COVID-19 and who is not shedding SARS-CoV-2  virus would expect to have a negative (not detected) result in this assay.   SARS-COV-2, NAA 2 DAY TAT     Status: None   Collection Time: 01/28/21 12:00 AM   Nasopharynge  Previous  Result Value Ref Range   SARS-CoV-2, NAA 2 DAY TAT Performed   Specimen status report     Status: None   Collection Time: 01/28/21 12:00 AM  Result Value Ref Range   specimen status report Comment     Comment: Please note Please note The date and/or time of collection was not indicated on the requisition as required by state and federal law.  The date of receipt of the specimen was used as the collection date if not supplied.   POCT HgB A1C     Status: Abnormal   Collection Time: 03/10/21  2:41 PM  Result Value Ref Range   Hemoglobin A1C 6.0 (A) 4.0 - 5.6 %   HbA1c POC (<> result, manual entry)     HbA1c, POC (prediabetic range)     HbA1c, POC (controlled diabetic range)        PHQ2/9: Depression screen [facility] 2/9 03/10/2021 01/27/2021 12/09/2020 09/08/2020 06/09/2020  Decreased Interest 0 0 1 3 3   Down, Depressed, Hopeless 1 0 0 3 1  PHQ - 2 Score 1 0 1 6 4   Altered sleeping 3 0 0 3 0  Tired, decreased energy 3 0 3 3 0  Change in appetite 0 0 3 0 0  Feeling bad or failure about yourself  0 0 0 2 0  Trouble concentrating 0 0 0 0 0  Moving slowly or fidgety/restless 1 0 0 1 0  Suicidal thoughts 0 0 0 0 0  PHQ-9 Score 8 0 7 15 4   Difficult doing work/chores Not difficult at all Not difficult at all - Very difficult -  Some recent data might be hidden    phq 9 is positive   Fall Risk: Fall Risk  03/10/2021 01/27/2021 12/09/2020 09/08/2020 06/09/2020  Falls in the past year? 0 0 0 0 0  Number falls in past yr: - 0 0 0 0  Injury with Fall? - 0 0 0 0  Risk for fall due to  : - - No Fall Risks - -  Follow up Falls prevention discussed - Falls prevention discussed Falls prevention discussed -      Functional Status Survey: Is the patient deaf or have difficulty hearing?: Yes Does the patient have difficulty seeing, even when wearing glasses/contacts?: No Does the patient have difficulty concentrating, remembering, or making decisions?: No Does the patient have difficulty walking or climbing stairs?: No Does the patient have difficulty dressing or bathing?: No Does the patient have difficulty doing errands alone such as visiting a doctor's office or shopping?: No    Assessment & Plan  1. Diabetes mellitus type 2 in obese (HCC)  - POCT HgB A1C - Dulaglutide (TRULICITY) 3 FK/8.1EX SOPN; Inject 3 mg as directed once a week.  Dispense: 6 mL; Refill: 1  2. Morbid obesity ([place])  Discussed with the patient the risk posed by an increased BMI. Discussed importance of portion control, calorie counting and at least 150 minutes of physical activity weekly. Avoid sweet beverages and drink more water. Eat at least 6 servings of fruit and vegetables daily    3. Vitamin B12 deficiency  - CBC with Differential/Platelet - Vitamin B12  4. Vitamin D deficiency  - VITAMIN D 25 Hydroxy (Vit-D Deficiency, Fractures) - Vitamin D,  Ergocalciferol, (DRISDOL) 1.25 MG (50000 UNIT) CAPS capsule; Take 1 capsule (50,000 Units total) by mouth every 7 (seven) days.  Dispense: 12 capsule; Refill: 1  5. Nonproliferative retinopathy due to secondary diabetes mellitus (HCC)  - Empagliflozin-metFORMIN HCl ER (SYNJARDY XR) 25-1000 MG TB24; Take 1 tablet by mouth daily.  Dispense: 90 tablet; Refill: 1  6. Essential (primary) hypertension  - COMPLETE METABOLIC PANEL WITH GFR - telmisartan-hydrochlorothiazide (MICARDIS HCT) 80-25 MG tablet; Take 1 tablet by mouth daily.  Dispense: 90 tablet; Refill: 1  7. Dyslipidemia  - Lipid panel  8. Type 2 diabetes with nephropathy (HCC)  -  Microalbumin / creatinine urine ratio  9. GAD (generalized anxiety disorder)  - venlafaxine XR (EFFEXOR XR) 75 MG 24 hr capsule; Take 1 capsule (75 mg total) by mouth 2 (two) times daily with breakfast and lunch.  Dispense: 180 capsule; Refill: 0  10. Dysthymia  - venlafaxine XR (EFFEXOR XR) 75 MG 24 hr capsule; Take 1 capsule (75 mg total) by mouth 2 (two) times daily with breakfast and lunch.  Dispense: 180 capsule; Refill: 0  11. Need for shingles vaccine  - Varicella-zoster vaccine IM

## 2021-03-10 ENCOUNTER — Encounter: Payer: Self-pay | Admitting: Family Medicine

## 2021-03-10 ENCOUNTER — Other Ambulatory Visit: Payer: Self-pay

## 2021-03-10 ENCOUNTER — Ambulatory Visit: Payer: 59 | Admitting: Family Medicine

## 2021-03-10 VITALS — BP 132/84 | HR 94 | Temp 97.9°F | Resp 18 | Ht 63.0 in | Wt 223.3 lb

## 2021-03-10 DIAGNOSIS — E559 Vitamin D deficiency, unspecified: Secondary | ICD-10-CM

## 2021-03-10 DIAGNOSIS — E538 Deficiency of other specified B group vitamins: Secondary | ICD-10-CM

## 2021-03-10 DIAGNOSIS — E1169 Type 2 diabetes mellitus with other specified complication: Secondary | ICD-10-CM | POA: Diagnosis not present

## 2021-03-10 DIAGNOSIS — E1121 Type 2 diabetes mellitus with diabetic nephropathy: Secondary | ICD-10-CM

## 2021-03-10 DIAGNOSIS — F411 Generalized anxiety disorder: Secondary | ICD-10-CM

## 2021-03-10 DIAGNOSIS — E133299 Other specified diabetes mellitus with mild nonproliferative diabetic retinopathy without macular edema, unspecified eye: Secondary | ICD-10-CM

## 2021-03-10 DIAGNOSIS — E669 Obesity, unspecified: Secondary | ICD-10-CM

## 2021-03-10 DIAGNOSIS — F341 Dysthymic disorder: Secondary | ICD-10-CM

## 2021-03-10 DIAGNOSIS — E785 Hyperlipidemia, unspecified: Secondary | ICD-10-CM

## 2021-03-10 DIAGNOSIS — Z23 Encounter for immunization: Secondary | ICD-10-CM

## 2021-03-10 DIAGNOSIS — I10 Essential (primary) hypertension: Secondary | ICD-10-CM

## 2021-03-10 LAB — POCT GLYCOSYLATED HEMOGLOBIN (HGB A1C): Hemoglobin A1C: 6 % — AB (ref 4.0–5.6)

## 2021-03-10 MED ORDER — TRULICITY 3 MG/0.5ML ~~LOC~~ SOAJ
3.0000 mg | SUBCUTANEOUS | 1 refills | Status: DC
Start: 2021-03-10 — End: 2021-08-13

## 2021-03-10 MED ORDER — VENLAFAXINE HCL ER 75 MG PO CP24: 75.0000 mg | ORAL_CAPSULE | Freq: Every day | ORAL | 1 refills | Status: DC

## 2021-03-10 MED ORDER — TELMISARTAN-HCTZ 80-25 MG PO TABS: 1.0000 | ORAL_TABLET | Freq: Every day | ORAL | 1 refills | Status: DC

## 2021-03-10 MED ORDER — VENLAFAXINE HCL ER 75 MG PO CP24: 75.0000 mg | ORAL_CAPSULE | Freq: Two times a day (BID) | ORAL | 0 refills | Status: DC

## 2021-03-10 MED ORDER — SYNJARDY XR 25-1000 MG PO TB24: 1.0000 | ORAL_TABLET | Freq: Every day | ORAL | 1 refills | Status: DC

## 2021-03-10 MED ORDER — VITAMIN D (ERGOCALCIFEROL) 1.25 MG (50000 UNIT) PO CAPS: 50000.0000 [IU] | ORAL_CAPSULE | ORAL | 1 refills | Status: DC

## 2021-03-10 MED ORDER — LEVOCETIRIZINE DIHYDROCHLORIDE 5 MG PO TABS: 5.0000 mg | ORAL_TABLET | Freq: Every evening | ORAL | 1 refills | Status: DC

## 2021-03-11 LAB — COMPLETE METABOLIC PANEL WITH GFR
AG Ratio: 1.9 (calc) (ref 1.0–2.5)
ALT: 17 U/L (ref 6–29)
AST: 15 U/L (ref 10–35)
Albumin: 4.3 g/dL (ref 3.6–5.1)
Alkaline phosphatase (APISO): 109 U/L (ref 37–153)
BUN: 9 mg/dL (ref 7–25)
CO2: 30 mmol/L (ref 20–32)
Calcium: 9.8 mg/dL (ref 8.6–10.4)
Chloride: 103 mmol/L (ref 98–110)
Creat: 0.8 mg/dL (ref 0.50–1.05)
Globulin: 2.3 g/dL (calc) (ref 1.9–3.7)
Glucose, Bld: 98 mg/dL (ref 65–99)
Potassium: 4.2 mmol/L (ref 3.5–5.3)
Sodium: 143 mmol/L (ref 135–146)
Total Bilirubin: 0.3 mg/dL (ref 0.2–1.2)
Total Protein: 6.6 g/dL (ref 6.1–8.1)
eGFR: 84 mL/min/{1.73_m2} (ref >=60)

## 2021-03-11 LAB — CBC WITH DIFFERENTIAL/PLATELET
Absolute Monocytes: 459 cells/uL (ref 200–950)
Basophils Absolute: 54 cells/uL (ref 0–200)
Basophils Relative: 0.6 %
Eosinophils Absolute: 216 cells/uL (ref 15–500)
Eosinophils Relative: 2.4 %
HCT: 41.6 % (ref 35.0–45.0)
Hemoglobin: 13.9 g/dL (ref 11.7–15.5)
Lymphs Abs: 2484 cells/uL (ref 850–3900)
MCH: 29.8 pg (ref 27.0–33.0)
MCHC: 33.4 g/dL (ref 32.0–36.0)
MCV: 89.3 fL (ref 80.0–100.0)
MPV: 10.8 fL (ref 7.5–12.5)
Monocytes Relative: 5.1 %
Neutro Abs: 5787 cells/uL (ref 1500–7800)
Neutrophils Relative %: 64.3 %
Platelets: 373 10*3/uL (ref 140–400)
RBC: 4.66 10*6/uL (ref 3.80–5.10)
RDW: 13.4 % (ref 11.0–15.0)
Total Lymphocyte: 27.6 %
WBC: 9 10*3/uL (ref 3.8–10.8)

## 2021-03-11 LAB — LIPID PANEL
Cholesterol: 185 mg/dL (ref <200)
HDL: 47 mg/dL — ABNORMAL LOW (ref >=50)
LDL Cholesterol (Calc): 111 mg/dL (calc) — ABNORMAL HIGH
Non-HDL Cholesterol (Calc): 138 mg/dL (calc) — ABNORMAL HIGH (ref <130)
Total CHOL/HDL Ratio: 3.9 (calc) (ref <5.0)
Triglycerides: 155 mg/dL — ABNORMAL HIGH (ref <150)

## 2021-03-11 LAB — MICROALBUMIN / CREATININE URINE RATIO
Creatinine, Urine: 56 mg/dL (ref 20–275)
Microalb Creat Ratio: 4 mcg/mg creat (ref <30)
Microalb, Ur: 0.2 mg/dL

## 2021-03-11 LAB — VITAMIN D 25 HYDROXY (VIT D DEFICIENCY, FRACTURES): Vit D, 25-Hydroxy: 109 ng/mL — ABNORMAL HIGH (ref 30–100)

## 2021-03-11 LAB — VITAMIN B12: Vitamin B-12: 357 pg/mL (ref 200–1100)

## 2021-03-16 ENCOUNTER — Other Ambulatory Visit: Payer: Self-pay | Admitting: Family Medicine

## 2021-03-16 DIAGNOSIS — E785 Hyperlipidemia, unspecified: Secondary | ICD-10-CM

## 2021-03-16 MED ORDER — ROSUVASTATIN CALCIUM 40 MG PO TABS: 40.0000 mg | ORAL_TABLET | Freq: Every day | ORAL | 1 refills | Status: DC

## 2021-03-16 NOTE — Progress Notes (Signed)
Left voicemail, awaiting return call.

## 2021-03-24 ENCOUNTER — Other Ambulatory Visit: Payer: Self-pay | Admitting: Family Medicine

## 2021-06-16 ENCOUNTER — Ambulatory Visit: Payer: 59 | Admitting: Family Medicine

## 2021-06-16 NOTE — Progress Notes (Unsigned)
Name: Wendy [name]   MRN: [id number]    DOB: December 04, 1960   Date:06/16/2021       Progress Note  Subjective  Chief Complaint  Follow up  HPI  DM with nephropathy, retinopathy:  hgbA1C went up from  7.1% to 7.7% ,  7.5%., 8.1 % , 9.7 ,8.2% , 7.3%  7.6% ,8.5%, 7.4% , 8.4 %, 7.7 % up to  8.4 %,7.1 %  6.3 % and today is down to 6 % . She has been taking Trulicity and Synjardi and is doing well, she is also following a healthier diet. .  She denies polyphagia, polydipsia or polyuria. She has retinopathy and reminded her to keep up with eye exam. She also has dyslipidemia, HTN, obesity . She lost another 7 lbs and is now 223.3 lbs.Weight used to be in the 250 lbs when we stopped insulin and increased dose of GLP-1 agonist   HTN: today she has been compliant with Telmisartan. HCTZ, bp has been at goal.    She denies any chest pain, palpitation or dizziness.    Hyperlipidemia: she is back on Atorvastatin and last LDL was 74, 02/2020.  She denies any side effects. will get labs next visit.  AR: she has nasal congestion, rhinorrhea but no cough or wheezing , she would like medication for it    Snoring: she did not go to sleep study, and she did not re-schedule it, she would like to try a home study instead. . She still feels tired daily. She knows the importance of the sleep study. She has also notices that she has to take a deep breath when sitting and watching TV, she is taking naps during the day. She states she is worried about living her son at home ( he is grown but has autism)  She has not been contacted about sleep study yet , contacted out referral coordinator and she needs to contact her insurance to find out who is covered.  She has not contacted her insurance about the home sleep study.     Extremity obesity: she lost weight ,she is still losing weight. Eating healthier but still drinking sodas    Hot Flashes/Depression and GAD: she has been on Effexor, no side effects of medication , she states it  helps with hot flashes, she can only tolerate effexor one in am and one at lunch , but not 150 mg  because it makes her feel sick . She states once a day causes nausea  Patient Active Problem List   Diagnosis Date Noted   Endometrial polyp 11/29/2017   Fibroid 11/29/2017   Post-menopausal bleeding 11/14/2017   Mild nonproliferative diabetic retinopathy of both eyes associated with type 2 diabetes mellitus ([place]) 06/09/2016   Vitamin D deficiency 07/07/2015   Vitamin B12 deficiency 07/07/2015   Snoring 12/25/2014   Allergic rhinitis, seasonal 12/14/2014   Chronic constipation 12/14/2014   Diabetes mellitus with renal manifestation ([place]) 12/14/2014   Dyslipidemia 12/14/2014   Dermatitis, eczematoid 12/14/2014   Edema extremities 12/14/2014   Essential (primary) hypertension 12/14/2014   Gastro-esophageal reflux disease without esophagitis 12/14/2014   Genital herpes in women 12/14/2014   Morbid obesity ([place]) 12/14/2014   Female climacteric state 12/14/2014    Past Surgical History:  Procedure Laterality Date   APPLICATION OF WOUND VAC  04/2018   CESAREAN SECTION  1993   LAPAROTOMY N/A 05/14/2018   Procedure: EXPLORATORY LAPAROTOMY;  Surgeon: Georganna Skeans, MD;  Location: [facility];  Service: General;  Laterality: N/A;  LAPAROTOMY N/A 05/16/2018   Procedure: EXPLORATORY LAPAROTOMY with reanatomosis and fascia closure with wound vacum application;  Surgeon: Erroll Luna, MD;  Location: [facility];  Service: General;  Laterality: N/A;   tonsillectomy and adnoidectomy     WISDOM TOOTH EXTRACTION      Family History  Problem Relation Age of Onset   Diabetes Mother    Asthma Mother    Heart disease Mother    Heart disease Father    Prostate cancer Father    Other Father        blockages in stomach   Kidney disease Paternal Grandmother    Asthma Son    Autism Son    Breast cancer Neg Hx    Colon cancer Neg Hx    Esophageal cancer Neg Hx    Pancreatic cancer Neg Hx    Rectal cancer  Neg Hx    Stomach cancer Neg Hx     Social History   Tobacco Use   Smoking status: Former    Years: 20.00    Types: Cigarettes    Start date: 04/19/1971    Quit date: 04/2018    Years since quitting: 3.1   Smokeless tobacco: Never   Tobacco comments:    she quit and started back October 2019, but quit again 04/2018  Substance Use Topics   Alcohol use: No    Alcohol/week: 0.0 standard drinks     Current Outpatient Medications:    acetaminophen (TYLENOL) 325 MG tablet, Take 2 tablets (650 mg total) by mouth every 6 (six) hours as needed for mild pain or headache., Disp: 25 tablet, Rfl: 0   blood glucose meter kit and supplies, Dispense based on patient and insurance preference. Use up to four times daily as directed. (FOR ICD-10 E10.9, E11.9)., Disp: 1 each, Rfl: 2   Blood Glucose Monitoring Suppl (ONE TOUCH ULTRA 2) w/Device KIT, 1 kit by Subdermal route See admin instructions., Disp: 1 each, Rfl: 0   busPIRone (BUSPAR) 5 MG tablet, Take 1 tablet (5 mg total) by mouth 2 (two) times daily., Disp: 180 tablet, Rfl: 0   Cholecalciferol (VITAMIN D) 2000 units CAPS, Take 1 capsule (2,000 Units total) by mouth daily., Disp: 30 capsule, Rfl: 0   dicyclomine (BENTYL) 20 MG tablet, Take 1 tablet (20 mg total) by mouth 3 (three) times daily as needed for spasms (abd cramping). (Patient not taking: Reported on 03/10/2021), Disp: 30 tablet, Rfl: 0   Dulaglutide (TRULICITY) 3 DU/2.0UR SOPN, Inject 3 mg as directed once a week., Disp: 6 mL, Rfl: 1   Empagliflozin-metFORMIN HCl ER (SYNJARDY XR) 25-1000 MG TB24, Take 1 tablet by mouth daily., Disp: 90 tablet, Rfl: 1   glucose blood (ONE TOUCH ULTRA TEST) test strip, CHECK FASTING BLOOD SUGARS TWICE DAILY, Disp: 100 each, Rfl: 2   levocetirizine (XYZAL) 5 MG tablet, Take 1 tablet (5 mg total) by mouth every evening., Disp: 90 tablet, Rfl: 1   ONETOUCH DELICA LANCETS FINE MISC, See admin instructions., Disp: , Rfl: 0   rosuvastatin (CRESTOR) 40 MG  tablet, Take 1 tablet (40 mg total) by mouth daily. In place of Atorvastatin, Disp: 90 tablet, Rfl: 1   telmisartan-hydrochlorothiazide (MICARDIS HCT) 80-25 MG tablet, Take 1 tablet by mouth daily., Disp: 90 tablet, Rfl: 1   valACYclovir (VALTREX) 500 MG tablet, TAKE 1 TABLET BY MOUTH 3 TIMES A DAY AS NEEDED FOR PREVENTION.AND DAILY FOR PREVENTION, Disp: 300 tablet, Rfl: 1   venlafaxine XR (EFFEXOR XR) 75 MG 24 hr  capsule, Take 1 capsule (75 mg total) by mouth 2 (two) times daily with breakfast and lunch., Disp: 180 capsule, Rfl: 0   Vitamin D, Ergocalciferol, (DRISDOL) 1.25 MG (50000 UNIT) CAPS capsule, Take 1 capsule (50,000 Units total) by mouth every 7 (seven) days., Disp: 12 capsule, Rfl: 1  Allergies  Allergen Reactions   Penicillins Swelling    Did it involve swelling of the face/tongue/throat, SOB, or low BP? Yes Did it involve sudden or severe rash/hives, skin peeling, or any reaction on the inside of your mouth or nose? No Did you need to seek medical attention at a hospital or doctor's office? Yes When did it last happen? unknown If all above answers are NO, may proceed with cephalosporin use.     I personally reviewed {Reviewed:14835} with the patient/caregiver today.   ROS  ***  Objective  There were no vitals filed for this visit.  There is no height or weight on file to calculate BMI.  Physical Exam ***  No results found for this or any previous visit (from the past 2160 hour(s)).  Diabetic Foot Exam: Diabetic Foot Exam - Simple   No data filed    ***  PHQ2/9: Depression screen [facility] 2/9 03/10/2021 01/27/2021 12/09/2020 09/08/2020 06/09/2020  Decreased Interest 0 0 _0 Down, Depressed, Hopeless 1 0 0 3 1  PHQ - 2 Score 1 0 _1 Altered sleeping 3 0 0 3 0  Tired, decreased energy 3 0 3 3 0  Change in appetite 0 0 3 0 0  Feeling bad or failure about yourself  0 0 0 2 0  Trouble concentrating 0 0 0 0 0  Moving slowly or fidgety/restless 1 0 0 1 0   Suicidal thoughts 0 0 0 0 0  PHQ-9 Score 8 0 _2 Difficult doing work/chores Not difficult at all Not difficult at all - Very difficult -  Some recent data might be hidden    phq 9 is {gen pos [id number]} ***  Fall Risk: Fall Risk  03/10/2021 01/27/2021 12/09/2020 09/08/2020 06/09/2020  Falls in the past year? 0 0 0 0 0  Number falls in past yr: - 0 0 0 0  Injury with Fall? - 0 0 0 0  Risk for fall due to : - - No Fall Risks - -  Follow up Falls prevention discussed - Falls prevention discussed Falls prevention discussed -   ***   Functional Status Survey:   ***   Assessment & Plan  *** There are no diagnoses linked to this encounter.

## 2021-07-27 ENCOUNTER — Other Ambulatory Visit: Payer: Self-pay | Admitting: Family Medicine

## 2021-07-27 DIAGNOSIS — I10 Essential (primary) hypertension: Secondary | ICD-10-CM

## 2021-08-13 ENCOUNTER — Ambulatory Visit (INDEPENDENT_AMBULATORY_CARE_PROVIDER_SITE_OTHER): Payer: 59 | Admitting: Internal Medicine

## 2021-08-13 VITALS — BP 120/78 | HR 107 | Temp 97.5°F | Resp 18 | Ht 63.0 in | Wt 216.0 lb

## 2021-08-13 DIAGNOSIS — A6004 Herpesviral vulvovaginitis: Secondary | ICD-10-CM

## 2021-08-13 DIAGNOSIS — E785 Hyperlipidemia, unspecified: Secondary | ICD-10-CM | POA: Diagnosis not present

## 2021-08-13 DIAGNOSIS — E1169 Type 2 diabetes mellitus with other specified complication: Secondary | ICD-10-CM

## 2021-08-13 DIAGNOSIS — J301 Allergic rhinitis due to pollen: Secondary | ICD-10-CM

## 2021-08-13 DIAGNOSIS — E669 Obesity, unspecified: Secondary | ICD-10-CM

## 2021-08-13 DIAGNOSIS — Z1231 Encounter for screening mammogram for malignant neoplasm of breast: Secondary | ICD-10-CM

## 2021-08-13 DIAGNOSIS — F411 Generalized anxiety disorder: Secondary | ICD-10-CM

## 2021-08-13 DIAGNOSIS — I10 Essential (primary) hypertension: Secondary | ICD-10-CM | POA: Diagnosis not present

## 2021-08-13 DIAGNOSIS — Z1211 Encounter for screening for malignant neoplasm of colon: Secondary | ICD-10-CM

## 2021-08-13 MED ORDER — BUSPIRONE HCL 5 MG PO TABS: 5.0000 mg | ORAL_TABLET | Freq: Two times a day (BID) | ORAL | 0 refills | Status: DC

## 2021-08-13 MED ORDER — SYNJARDY XR 25-1000 MG PO TB24: 1.0000 | ORAL_TABLET | Freq: Every day | ORAL | 0 refills | Status: DC

## 2021-08-13 MED ORDER — TELMISARTAN-HCTZ 80-25 MG PO TABS: 1.0000 | ORAL_TABLET | Freq: Every day | ORAL | 0 refills | Status: DC

## 2021-08-13 MED ORDER — TRULICITY 3 MG/0.5ML ~~LOC~~ SOAJ: 3.0000 mg | SUBCUTANEOUS | 0 refills | Status: DC

## 2021-08-13 MED ORDER — ROSUVASTATIN CALCIUM 40 MG PO TABS: 40.0000 mg | ORAL_TABLET | Freq: Every day | ORAL | 1 refills | Status: DC

## 2021-08-13 MED ORDER — VALACYCLOVIR HCL 500 MG PO TABS: ORAL_TABLET | ORAL | 1 refills | Status: DC

## 2021-08-13 MED ORDER — VENLAFAXINE HCL ER 75 MG PO CP24: 75.0000 mg | ORAL_CAPSULE | Freq: Two times a day (BID) | ORAL | 0 refills | Status: DC

## 2021-08-13 MED ORDER — LEVOCETIRIZINE DIHYDROCHLORIDE 5 MG PO TABS: 5.0000 mg | ORAL_TABLET | Freq: Every evening | ORAL | 1 refills | Status: DC

## 2021-08-13 NOTE — Assessment & Plan Note (Signed)
Last A1c stable at 6.0% in November, 2022.  Doing well on Trulicity 3 mg weekly and Synjardy 25-1000 mg daily, refill sent today. ?

## 2021-08-13 NOTE — Patient Instructions (Addendum)
It was great seeing you today! ? ?Plan discussed at today's visit: ?-Medications refilled today ?-Mammogram ordered, please call number on card to schedule ?-Referral to GI for colonoscopy ordered today as well  ? ?Follow up in: 6 months  ? ?Take care and let us know if you have any questions or concerns prior to your next visit. ? ?Dr. Rosana Berger ? ?

## 2021-08-13 NOTE — Assessment & Plan Note (Signed)
Blood pressure stable today.  Continue telmisartan-HCTZ 80-25 mg daily, refill sent today. ?

## 2021-08-13 NOTE — Assessment & Plan Note (Signed)
Stable.  Refill Valtrex 500 mg 3 times daily to take as needed. ?

## 2021-08-13 NOTE — Progress Notes (Signed)
? ?Established Patient Office Visit ? ?Subjective   ?Patient ID: Wendy [name], female    DOB: 01-16-1961  Age: 61 y.o. MRN: [id number] ? ?Chief Complaint  ?Patient presents with  ? Follow-up  ? Diabetes  ? Hyperlipidemia  ? Hypertension  ? ? ?HPI ?Wendy [name] is a 61 year old female here for follow-up on chronic medical conditions. ? ?Hypertension: ?-Medications: Telmisartan-hydrochlorothiazide 80-25 mg ?-Patient is compliant with above medications and reports no side effects. ?-Checking BP at home (average): doesn't check  ?-Denies any SOB, CP, vision changes, LE edema or symptoms of hypotension ? ?HLD: ?-Medications: Crestor 40 ?-Patient is compliant with above medications and reports no side effects.  ?-Last lipid panel: Lipid Panel  ?   ?Component Value Date/Time  ? CHOL 185 03/10/2021 1522  ? CHOL 140 12/25/2014 1536  ? TRIG 155 (H) 03/10/2021 1522  ? HDL 47 (L) 03/10/2021 1522  ? HDL 48 12/25/2014 1536  ? CHOLHDL 3.9 03/10/2021 1522  ? VLDL 24 08/25/2016 1100  ? LDLCALC 111 (H) 03/10/2021 1522  ? LABVLDL 24 12/25/2014 1536  ? ?The 10-year ASCVD risk score (Arnett DK, et al., 2019) is: 18% ?  Values used to calculate the score: ?    Age: 50 years ?    Sex: Female ?    Is Non-Hispanic African American: Yes ?    Diabetic: Yes ?    Tobacco smoker: No ?    Systolic Blood Pressure: 959 mmHg ?    Is BP treated: Yes ?    HDL Cholesterol: 47 mg/dL ?    Total Cholesterol: 185 mg/dL ? ?Diabetes, Type 2: ?-Last A1c 11/22 6.0 ?-Medications: Trulicity 3 mg weekly on Wednesdays, Synjardy 25-1000 mg daily ?-Patient is compliant with the above medications and reports no side effects.  ?-Checking BG at home: doesn't check ?-Eye exam: Due ?-Foot exam: UTD 11/22 ?-Microalbumin: 11/22 ?-Statin: Yes ?-PNA vaccine: 23 in 2016 ?-Denies symptoms of hypoglycemia, polyuria, polydipsia, numbness extremities, foot ulcers/trauma.  ? ?History of recurrent HSV: ?-Uses Valtrex 500 mg 3 times daily as needed, and several months since last  flare ? ?Allergic rhinitis: ?-Currently on Xyzal 5 mg, doing well. ? ?MDD: ?-Mood status: controlled ?-Current treatment: Effexor 75 mg twice daily, BuSpar 5 mg ?-Satisfied with current treatment?: yes ?-Symptom severity: moderate  ?-Duration of current treatment : chronic ?-Side effects: no ?Medication compliance: excellent compliance ? ? ?  08/13/2021  ?  1:56 PM 03/10/2021  ?  2:39 PM 01/27/2021  ?  2:07 PM 12/09/2020  ?  2:44 PM 09/08/2020  ?  2:36 PM  ?Depression screen PHQ 2/9  ?Decreased Interest 0 0 0 1 3  ?Down, Depressed, Hopeless 0 1 0 0 3  ?PHQ - 2 Score 0 1 0 1 6  ?Altered sleeping 0 3 0 0 3  ?Tired, decreased energy 0 3 0 3 3  ?Change in appetite 0 0 0 3 0  ?Feeling bad or failure about yourself  0 0 0 0 2  ?Trouble concentrating 0 0 0 0 0  ?Moving slowly or fidgety/restless 0 1 0 0 1  ?Suicidal thoughts 0 0 0 0 0  ?PHQ-9 Score 0 8 0 7 15  ?Difficult doing work/chores Not difficult at all Not difficult at all Not difficult at all  Very difficult  ? ?Health maintenance: ?-Blood work up-to-date ?-Mammogram 1/22, due ?-Colon cancer screening: Colonoscopy 08/2016, repeat in 5 years, due ? ? ?Review of Systems  ?Constitutional:  Negative for chills and fever.  ?Eyes:  Negative for blurred vision.  ?Respiratory:  Negative for cough.   ?Cardiovascular:  Negative for chest pain.  ?Gastrointestinal:  Negative for abdominal pain, nausea and vomiting.  ?Neurological:  Negative for dizziness and headaches.  ? ?  ?Objective:  ?  ? ?BP 120/78   Pulse (!) 107   Temp (!) 97.5 ?F (36.4 ?C)   Resp 18   Ht 5' 3"  (1.6 m)   Wt 216 lb (98 kg)   LMP 01/26/2017 (Approximate)   SpO2 99%   BMI 38.26 kg/m?  ?BP Readings from Last 3 Encounters:  ?08/13/21 120/78  ?03/10/21 132/84  ?12/09/20 122/70  ? ?Wt Readings from Last 3 Encounters:  ?08/13/21 216 lb (98 kg)  ?03/10/21 223 lb 4.8 oz (101.3 kg)  ?01/27/21 230 lb (104.3 kg)  ? ?  ? ?Physical Exam ?Constitutional:   ?   Appearance: Normal appearance.  ?HENT:  ?   Head:  Normocephalic and atraumatic.  ?Eyes:  ?   Conjunctiva/sclera: Conjunctivae normal.  ?Cardiovascular:  ?   Rate and Rhythm: Normal rate and regular rhythm.  ?Pulmonary:  ?   Effort: Pulmonary effort is normal.  ?   Breath sounds: Normal breath sounds.  ?Musculoskeletal:     ?   General: Swelling present.  ?   Right lower leg: No edema.  ?   Left lower leg: No edema.  ?   Comments: Mild left ankle swelling  ?Skin: ?   General: Skin is warm and dry.  ?Neurological:  ?   General: No focal deficit present.  ?   Mental Status: She is alert. Mental status is at baseline.  ?Psychiatric:     ?   Mood and Affect: Mood normal.     ?   Behavior: Behavior normal.  ? ? ? ?No results found for any visits on 08/13/21. ? ?Last CBC ?Lab Results  ?Component Value Date  ? WBC 9.0 03/10/2021  ? HGB 13.9 03/10/2021  ? HCT 41.6 03/10/2021  ? MCV 89.3 03/10/2021  ? MCH 29.8 03/10/2021  ? RDW 13.4 03/10/2021  ? PLT 373 03/10/2021  ? ?Last metabolic panel ?Lab Results  ?Component Value Date  ? GLUCOSE 98 03/10/2021  ? NA 143 03/10/2021  ? K 4.2 03/10/2021  ? CL 103 03/10/2021  ? CO2 30 03/10/2021  ? BUN 9 03/10/2021  ? CREATININE 0.80 03/10/2021  ? EGFR 84 03/10/2021  ? CALCIUM 9.8 03/10/2021  ? PHOS 2.5 06/04/2018  ? PROT 6.6 03/10/2021  ? ALBUMIN 2.0 (L) 05/31/2018  ? LABGLOB 2.3 12/25/2014  ? AGRATIO 1.9 12/25/2014  ? BILITOT 0.3 03/10/2021  ? ALKPHOS 90 05/31/2018  ? AST 15 03/10/2021  ? ALT 17 03/10/2021  ? ANIONGAP 8 06/04/2018  ? ?Last lipids ?Lab Results  ?Component Value Date  ? CHOL 185 03/10/2021  ? HDL 47 (L) 03/10/2021  ? LDLCALC 111 (H) 03/10/2021  ? TRIG 155 (H) 03/10/2021  ? CHOLHDL 3.9 03/10/2021  ? ?Last hemoglobin A1c ?Lab Results  ?Component Value Date  ? HGBA1C 6.0 (A) 03/10/2021  ? ?Last thyroid functions ?Lab Results  ?Component Value Date  ? TSH 1.49 06/19/2019  ? ?Last vitamin D ?Lab Results  ?Component Value Date  ? VD25OH 109 (H) 03/10/2021  ? ?Last vitamin B12 and Folate ?Lab Results  ?Component Value Date  ?  KMQKMMNO17 357 03/10/2021  ? ?  ? ?The 10-year ASCVD risk score (Arnett DK, et al., 2019) is: 18% ? ?  ?Assessment & Plan:  ? ?  Problem List Items Addressed This Visit   ? ?  ? Cardiovascular and Mediastinum  ? Essential (primary) hypertension - Primary  ?  Blood pressure stable today.  Continue telmisartan-HCTZ 80-25 mg daily, refill sent today. ? ?  ?  ? Relevant Medications  ? telmisartan-hydrochlorothiazide (MICARDIS HCT) 80-25 MG tablet  ?   ?  ? Respiratory  ? Allergic rhinitis, seasonal  ?  Doing well, continue Xyzal 5 mg daily, refill sent today. ? ?  ?  ?  ? Endocrine  ? Diabetes mellitus type 2 in obese [facility])  ?  Last A1c stable at 6.0% in November, 2022.  Doing well on Trulicity 3 mg weekly and Synjardy 25-1000 mg daily, refill sent today. ? ?  ?  ? Relevant Medications  ?   ? Dulaglutide (TRULICITY) 3 ZX/6.7SW SOPN  ? Empagliflozin-metFORMIN HCl ER (SYNJARDY XR) 25-1000 MG TB24  ?   ?  ? Genitourinary  ? Herpes simplex vulvovaginitis  ?  Stable.  Refill Valtrex 500 mg 3 times daily to take as needed. ? ?  ?  ? Relevant Medications  ? valACYclovir (VALTREX) 500 MG tablet  ?  ? Other  ? Dyslipidemia  ?  Stable.  Reviewed last lipid panel from November, 2022.  Continue Crestor 40 mg daily, refills today. ? ?  ?  ? Relevant Medications  ? rosuvastatin (CRESTOR) 40 MG tablet  ? GAD (generalized anxiety disorder)  ?  Overall mood stable.  Continue Effexor 75 mg twice daily as well as BuSpar 5 mg daily.  Refills ordered today. ? ?  ?  ? Relevant Medications  ? venlafaxine XR (EFFEXOR XR) 75 MG 24 hr capsule  ? busPIRone (BUSPAR) 5 MG tablet  ? ?Other Visit Diagnoses   ? ? Encounter for screening mammogram for malignant neoplasm of breast      ? Relevant Orders  ? MM Digital Screening  ? Colon cancer screening      ? Relevant Orders  ? Ambulatory referral to Gastroenterology  ? ?  ? ? ?Return in about 6 months (around 02/12/2022).  ? ? ?Teodora Medici, DO ? ?

## 2021-08-13 NOTE — Assessment & Plan Note (Signed)
Stable.  Reviewed last lipid panel from November, 2022.  Continue Crestor 40 mg daily, refills today. ?

## 2021-08-13 NOTE — Assessment & Plan Note (Signed)
Overall mood stable.  Continue Effexor 75 mg twice daily as well as BuSpar 5 mg daily.  Refills ordered today. ?

## 2021-08-13 NOTE — Assessment & Plan Note (Signed)
Doing well, continue Xyzal 5 mg daily, refill sent today. ?

## 2021-08-16 NOTE — Addendum Note (Signed)
Addended by: Teodora Medici on: 08/16/2021 12:27 PM ? ? Modules accepted: Orders ? ?

## 2021-12-11 ENCOUNTER — Other Ambulatory Visit: Payer: Self-pay | Admitting: Internal Medicine

## 2021-12-11 DIAGNOSIS — E1169 Type 2 diabetes mellitus with other specified complication: Secondary | ICD-10-CM

## 2021-12-13 NOTE — Telephone Encounter (Signed)
Requested Prescriptions  Pending Prescriptions Disp Refills  . TRULICITY 3 QI/3.4VQ SOPN [Pharmacy Med Name: TRULICITY 3 QV/9.5 ML PEN] 6 mL 0    Sig: INJECT 3 MG AS DIRECTED ONCE A WEEK.     Endocrinology:  Diabetes - GLP-1 Receptor Agonists Failed - 12/11/2021  9:22 AM      Failed - HBA1C is between 0 and 7.9 and within 180 days    Hemoglobin A1C  Date Value Ref Range Status  03/10/2021 6.0 (A) 4.0 - 5.6 % Final   HbA1c, POC (controlled diabetic range)  Date Value Ref Range Status  06/19/2019 7.4 (A) 0.0 - 7.0 % Final         Passed - Valid encounter within last 6 months    Recent Outpatient Visits          4 months ago Essential (primary) hypertension   [place], DO   9 months ago Diabetes mellitus type 2 in obese [facility])   [facility] Steele Sizer, MD   10 months ago Viral illness   [facility] [place], Kristeen Miss, PA-C   1 year ago Diabetes mellitus type 2 in obese [facility])   [facility] Steele Sizer, MD   1 year ago Diabetes mellitus type 2 in obese [facility])   [facility] Steele Sizer, MD      Future Appointments            In 2 months Ancil Boozer, Drue Stager, MD [facility], [facility]

## 2022-01-08 ENCOUNTER — Other Ambulatory Visit: Payer: Self-pay | Admitting: Internal Medicine

## 2022-01-08 DIAGNOSIS — I10 Essential (primary) hypertension: Secondary | ICD-10-CM

## 2022-01-10 NOTE — Telephone Encounter (Signed)
Requested medication (s) are due for refill today: yes  Requested medication (s) are on the active medication list: yes  Last refill:  08/13/21 #90  Future visit scheduled: yes  Notes to clinic:  labs overdue per protocol   Requested Prescriptions  Pending Prescriptions Disp Refills   telmisartan-hydrochlorothiazide (MICARDIS HCT) 80-25 MG tablet [Pharmacy Med Name: TELMISARTAN-HCTZ 80-25 MG TAB] 90 tablet 0    Sig: TAKE 1 TABLET BY MOUTH EVERY DAY     Cardiovascular: ARB + Diuretic Combos Failed - 01/08/2022  2:56 PM      Failed - K in normal range and within 180 days    Potassium  Date Value Ref Range Status  03/10/2021 4.2 3.5 - 5.3 mmol/L Final         Failed - Na in normal range and within 180 days    Sodium  Date Value Ref Range Status  03/10/2021 143 135 - 146 mmol/L Final  12/25/2014 144 134 - 144 mmol/L Final         Failed - Cr in normal range and within 180 days    Creat  Date Value Ref Range Status  03/10/2021 0.80 0.50 - 1.05 mg/dL Final   Creatinine, Urine  Date Value Ref Range Status  03/10/2021 56 20 - 275 mg/dL Final         Failed - eGFR is 10 or above and within 180 days    GFR, Est African American  Date Value Ref Range Status  03/04/2020 91 > OR = 60 mL/min/1.81m Final   GFR, Est Non African American  Date Value Ref Range Status  03/04/2020 78 > OR = 60 mL/min/1.728mFinal   eGFR  Date Value Ref Range Status  03/10/2021 84 > OR = 60 mL/min/1.7363minal    Comment:    The eGFR is based on the CKD-EPI 2021 equation. To calculate  the new eGFR from a previous Creatinine or Cystatin C result, go to https://www.kidney.org/professionals/ kdoqi/gfr%5Fcalculator          Passed - Patient is not pregnant      Passed - Last BP in normal range    BP Readings from Last 1 Encounters:  08/13/21 120/78         Passed - Valid encounter within last 6 months    Recent Outpatient Visits           5 months ago [facility] (primary) hypertension    [facility]   10 months ago [facility]   11 months ago [facility]al illness   [facility]   1 year [facility]   1 year [facility] type [facility]       Future Appointments             In 1 month [facility]

## 2022-02-10 NOTE — Progress Notes (Signed)
Name: Wendy [name]   MRN: [id number]    DOB: 1960/05/03   Date:02/11/2022       Progress Note  Subjective  Chief Complaint  Follow Up  HPI  * DM with nephropathy, retinopathy:  hgbA1C went up from  7.1% to 7.7% ,  7.5%., 8.1 % , 9.7 ,8.2% , 7.3%  7.6% ,8.5%, 7.4% , 8.4 %, 7.7 % up to  8.4 % ,7.1 %  6.3 % and 6 %  today is 5.7 % . She has been taking Trulicity and Synjardi and is doing well, she is also following a healthier diet, she continues to lose weight, she states taking Synjardi occasionally since it causes her to feel bloated, we will stop metformin component and continue Jardiance. She denies polyphagia, polydipsia or polyuria. She has retinopathy and reminded her to keep up with eye exam. She also has dyslipidemia, HTN, obesity .  HTN: today she has been compliant with Telmisartan. HCTZ, bp is usually at goal but today it was elevated when she arrived and still at 142 before she went home, we will stop Metformin and hopefully once she takes Jardiance daily the level will improve   Hyperlipidemia: she is back on Atorvastatin and last LDL was up from 74 to 111 , we will recheck it today    Morbid obesity: eating smaller portions, more salads during the Summer, weigh tis down ,continue life style modification . BMI over 35 with co-morbidities such as DM, HTN, and dyslipidemia    Hot Flashes/Depression and GAD: she has been on Effexor,she states it helps with hot flashes, she can only tolerate effexor one in am and one at lunch , but not 150 mg  because it makes her feel sick . She states once a day causes nausea She needs refill of medication today Phq 9 was 2 but she is happy with current dose and does not want to change regiment   Patient Active Problem List   Diagnosis Date Noted   GAD (generalized anxiety disorder) 08/13/2021   Endometrial polyp 11/29/2017   Fibroid 11/29/2017   Post-menopausal bleeding 11/14/2017   Mild nonproliferative diabetic retinopathy of both eyes  associated with type 2 diabetes mellitus ([place]) 06/09/2016   Diabetes mellitus type 2 in obese ([place]) 03/01/2016   Vitamin D deficiency 07/07/2015   Vitamin B12 deficiency 07/07/2015   Snoring 12/25/2014   Allergic rhinitis, seasonal 12/14/2014   Chronic constipation 12/14/2014   Diabetes mellitus with renal manifestation ([place]) 12/14/2014   Dyslipidemia 12/14/2014   Dermatitis, eczematoid 12/14/2014   Edema extremities 12/14/2014   Essential (primary) hypertension 12/14/2014   Gastro-esophageal reflux disease without esophagitis 12/14/2014   Herpes simplex vulvovaginitis 12/14/2014   Morbid obesity ([place]) 12/14/2014   Female climacteric state 12/14/2014    Past Surgical History:  Procedure Laterality Date   APPLICATION OF WOUND VAC  04/2018   CESAREAN SECTION  1993   LAPAROTOMY N/A 05/14/2018   Procedure: EXPLORATORY LAPAROTOMY;  Surgeon: Georganna Skeans, MD;  Location: [facility];  Service: General;  Laterality: N/A;   LAPAROTOMY N/A 05/16/2018   Procedure: EXPLORATORY LAPAROTOMY with reanatomosis and fascia closure with wound vacum application;  Surgeon: Erroll Luna, MD;  Location: [facility];  Service: General;  Laterality: N/A;   tonsillectomy and adnoidectomy     WISDOM TOOTH EXTRACTION      Family History  Problem Relation Age of Onset   Diabetes Mother    Asthma Mother    Heart disease Mother    Heart disease Father  Prostate cancer Father    Other Father        blockages in stomach   Kidney disease Paternal Grandmother    Asthma Son    Autism Son    Breast cancer Neg Hx    Colon cancer Neg Hx    Esophageal cancer Neg Hx    Pancreatic cancer Neg Hx    Rectal cancer Neg Hx    Stomach cancer Neg Hx     Social History   Tobacco Use   Smoking status: Former    Years: 20.00    Types: Cigarettes    Start date: 04/19/1971    Quit date: 04/2018    Years since quitting: 3.8   Smokeless tobacco: Never   Tobacco comments:    she quit and started back October 2019, but  quit again 04/2018  Substance Use Topics   Alcohol use: No    Alcohol/week: 0.0 standard drinks of alcohol     Current Outpatient Medications:    acetaminophen (TYLENOL) 325 MG tablet, Take 2 tablets (650 mg total) by mouth every 6 (six) hours as needed for mild pain or headache., Disp: 25 tablet, Rfl: 0   blood glucose meter kit and supplies, Dispense based on patient and insurance preference. Use up to four times daily as directed. (FOR ICD-10 E10.9, E11.9)., Disp: 1 each, Rfl: 2   Blood Glucose Monitoring Suppl (ONE TOUCH ULTRA 2) w/Device KIT, 1 kit by Subdermal route See admin instructions., Disp: 1 each, Rfl: 0   Cholecalciferol (VITAMIN D) 2000 units CAPS, Take 1 capsule (2,000 Units total) by mouth daily., Disp: 30 capsule, Rfl: 0   empagliflozin (JARDIANCE) 25 MG TABS tablet, Take 1 tablet (25 mg total) by mouth daily before breakfast. In place of synjardi, Disp: 90 tablet, Rfl: 1   glucose blood (ONE TOUCH ULTRA TEST) test strip, CHECK FASTING BLOOD SUGARS TWICE DAILY, Disp: 100 each, Rfl: 2   levocetirizine (XYZAL) 5 MG tablet, Take 1 tablet (5 mg total) by mouth every evening., Disp: 90 tablet, Rfl: 1   ONETOUCH DELICA LANCETS FINE MISC, See admin instructions., Disp: , Rfl: 0   valACYclovir (VALTREX) 500 MG tablet, TAKE 1 TABLET BY MOUTH 3 TIMES A DAY AS NEEDED FOR PREVENTION.AND DAILY FOR PREVENTION, Disp: 300 tablet, Rfl: 1   busPIRone (BUSPAR) 5 MG tablet, Take 1 tablet (5 mg total) by mouth 2 (two) times daily., Disp: 180 tablet, Rfl: 0   Dulaglutide (TRULICITY) 3 TD/3.2KG SOPN, Inject 3 mg as directed once a week., Disp: 6 mL, Rfl: 0   rosuvastatin (CRESTOR) 40 MG tablet, Take 1 tablet (40 mg total) by mouth daily. In place of Atorvastatin, Disp: 90 tablet, Rfl: 1   telmisartan-hydrochlorothiazide (MICARDIS HCT) 80-25 MG tablet, Take 1 tablet by mouth daily., Disp: 90 tablet, Rfl: 1   venlafaxine XR (EFFEXOR XR) 75 MG 24 hr capsule, Take 1 capsule (75 mg total) by mouth 2 (two)  times daily with breakfast and lunch., Disp: 180 capsule, Rfl: 1   Vitamin D, Ergocalciferol, (DRISDOL) 1.25 MG (50000 UNIT) CAPS capsule, Take 1 capsule (50,000 Units total) by mouth every 7 (seven) days., Disp: 12 capsule, Rfl: 1  Allergies  Allergen Reactions   Penicillins Swelling    Did it involve swelling of the face/tongue/throat, SOB, or low BP? Yes Did it involve sudden or severe rash/hives, skin peeling, or any reaction on the inside of your mouth or nose? No Did you need to seek medical attention at a hospital or doctor's  office? Yes When did it last happen? unknown If all above answers are "NO", may proceed with cephalosporin use.     I personally reviewed active problem list, medication list, allergies, family history, social history, health maintenance with the patient/caregiver today.   ROS  Constitutional: Negative for fever , positive for weight change.  Respiratory: Negative for cough and shortness of breath.   Cardiovascular: Negative for chest pain or palpitations.  Gastrointestinal: Negative for abdominal pain, no bowel changes.  Musculoskeletal: Negative for gait problem or joint swelling.  Skin: Negative for rash.  Neurological: Negative for dizziness or headache.  No other specific complaints in a complete review of systems (except as listed in HPI above).  Objective  Vitals:   02/11/22 1430 02/11/22 1437 02/11/22 1539  BP: (!) 160/82 (!) 146/78 (!) 142/76  Pulse: 97    Resp: 16    Temp: 98.1 F (36.7 C)    TempSrc: Oral    SpO2: 98%    Weight: 211 lb 9.6 oz (96 kg)    Height: 5' 3"  (1.6 m)      Body mass index is 37.48 kg/m.  Physical Exam  Constitutional: Patient appears well-developed and well-nourished. Obese  No distress.  HEENT: head atraumatic, normocephalic, pupils equal and reactive to light,, neck supple Cardiovascular: Normal rate, regular rhythm and normal heart sounds.  No murmur heard. No BLE edema. Pulmonary/Chest: Effort normal  and breath sounds normal. No respiratory distress. Abdominal: Soft.  There is no tenderness. Psychiatric: Patient has a normal mood and affect. behavior is normal. Judgment and thought content normal.   Recent Results (from the past 2160 hour(s))  POCT HgB A1C     Status: Abnormal   Collection Time: 02/11/22  2:33 PM  Result Value Ref Range   Hemoglobin A1C 5.7 (A) 4.0 - 5.6 %   HbA1c POC (<> result, manual entry)     HbA1c, POC (prediabetic range)     HbA1c, POC (controlled diabetic range)      Diabetic Foot Exam: Diabetic Foot Exam - Simple   Simple Foot Form Visual Inspection No deformities, no ulcerations, no other skin breakdown bilaterally: Yes Sensation Testing Intact to touch and monofilament testing bilaterally: Yes Pulse Check Posterior Tibialis and Dorsalis pulse intact bilaterally: Yes Comments      PHQ2/9:    02/11/2022    2:28 PM 08/13/2021    1:56 PM 03/10/2021    2:39 PM 01/27/2021    2:07 PM 12/09/2020    2:44 PM  Depression screen PHQ 2/9  Decreased Interest 3 0 0 0 1  Down, Depressed, Hopeless 0 0 1 0 0  PHQ - 2 Score 3 0 1 0 1  Altered sleeping 0 0 3 0 0  Tired, decreased energy 0 0 3 0 3  Change in appetite 0 0 0 0 3  Feeling bad or failure about yourself  0 0 0 0 0  Trouble concentrating 0 0 0 0 0  Moving slowly or fidgety/restless 0 0 1 0 0  Suicidal thoughts 0 0 0 0 0  PHQ-9 Score 3 0 8 0 7  Difficult doing work/chores Not difficult at all Not difficult at all Not difficult at all Not difficult at all     phq 9 is positive   Fall Risk:    02/11/2022    2:28 PM 08/13/2021    1:55 PM 03/10/2021    2:39 PM 01/27/2021    2:07 PM 12/09/2020    2:44 PM  Fall Risk   Falls in the past year? 0 0 0 0 0  Number falls in past yr: 0 0  0 0  Injury with Fall? 0 0  0 0  Risk for fall due to : No Fall Risks    No Fall Risks  Follow up Education provided;Falls evaluation completed;Falls prevention discussed  Falls prevention discussed  Falls  prevention discussed      Functional Status Survey: Is the patient deaf or have difficulty hearing?: Yes Does the patient have difficulty seeing, even when wearing glasses/contacts?: No Does the patient have difficulty concentrating, remembering, or making decisions?: Yes Does the patient have difficulty walking or climbing stairs?: No Does the patient have difficulty dressing or bathing?: No Does the patient have difficulty doing errands alone such as visiting a doctor's office or shopping?: No    Assessment & Plan  1. Type 2 diabetes with nephropathy (HCC)  - POCT HgB A1C - Urine Microalbumin w/creat. ratio - COMPLETE METABOLIC PANEL WITH GFR - HM Diabetes Foot Exam - Ambulatory referral to Ophthalmology - empagliflozin (JARDIANCE) 25 MG TABS tablet; Take 1 tablet (25 mg total) by mouth daily before breakfast. In place of synjardi  Dispense: 90 tablet; Refill: 1  2. Essential (primary) hypertension  - CBC with Differential/Platelet - telmisartan-hydrochlorothiazide (MICARDIS HCT) 80-25 MG tablet; Take 1 tablet by mouth daily.  Dispense: 90 tablet; Refill: 1  3. Morbid obesity ([place])  Doing well with weight loss  4. Vitamin D deficiency  - VITAMIN D 25 Hydroxy (Vit-D Deficiency, Fractures) - Vitamin D, Ergocalciferol, (DRISDOL) 1.25 MG (50000 UNIT) CAPS capsule; Take 1 capsule (50,000 Units total) by mouth every 7 (seven) days.  Dispense: 12 capsule; Refill: 1  5. Vitamin B12 deficiency   6. Dyslipidemia  - Lipid panel - rosuvastatin (CRESTOR) 40 MG tablet; Take 1 tablet (40 mg total) by mouth daily. In place of Atorvastatin  Dispense: 90 tablet; Refill: 1  7. Colon cancer screening  - Ambulatory referral to Gastroenterology  8. GAD (generalized anxiety disorder)  - venlafaxine XR (EFFEXOR XR) 75 MG 24 hr capsule; Take 1 capsule (75 mg total) by mouth 2 (two) times daily with breakfast and lunch.  Dispense: 180 capsule; Refill: 1 - busPIRone (BUSPAR) 5 MG  tablet; Take 1 tablet (5 mg total) by mouth 2 (two) times daily.  Dispense: 180 tablet; Refill: 0  9. Long-term use of high-risk medication  - Vitamin B12  10. Diabetes mellitus type 2 in obese (HCC)  - Dulaglutide (TRULICITY) 3 JM/4.2AS SOPN; Inject 3 mg as directed once a week.  Dispense: 6 mL; Refill: 0

## 2022-02-11 ENCOUNTER — Ambulatory Visit: Payer: 59 | Admitting: Family Medicine

## 2022-02-11 ENCOUNTER — Encounter: Payer: Self-pay | Admitting: Family Medicine

## 2022-02-11 VITALS — BP 142/76 | HR 97 | Temp 98.1°F | Resp 16 | Ht 63.0 in | Wt 211.6 lb

## 2022-02-11 DIAGNOSIS — E559 Vitamin D deficiency, unspecified: Secondary | ICD-10-CM

## 2022-02-11 DIAGNOSIS — E669 Obesity, unspecified: Secondary | ICD-10-CM

## 2022-02-11 DIAGNOSIS — E785 Hyperlipidemia, unspecified: Secondary | ICD-10-CM

## 2022-02-11 DIAGNOSIS — Z1211 Encounter for screening for malignant neoplasm of colon: Secondary | ICD-10-CM

## 2022-02-11 DIAGNOSIS — I10 Essential (primary) hypertension: Secondary | ICD-10-CM

## 2022-02-11 DIAGNOSIS — E1121 Type 2 diabetes mellitus with diabetic nephropathy: Secondary | ICD-10-CM | POA: Diagnosis not present

## 2022-02-11 DIAGNOSIS — Z79899 Other long term (current) drug therapy: Secondary | ICD-10-CM

## 2022-02-11 DIAGNOSIS — E1169 Type 2 diabetes mellitus with other specified complication: Secondary | ICD-10-CM

## 2022-02-11 DIAGNOSIS — E538 Deficiency of other specified B group vitamins: Secondary | ICD-10-CM

## 2022-02-11 DIAGNOSIS — F411 Generalized anxiety disorder: Secondary | ICD-10-CM

## 2022-02-11 LAB — POCT GLYCOSYLATED HEMOGLOBIN (HGB A1C): Hemoglobin A1C: 5.7 % — AB (ref 4.0–5.6)

## 2022-02-11 MED ORDER — EMPAGLIFLOZIN 25 MG PO TABS: 25.0000 mg | ORAL_TABLET | Freq: Every day | ORAL | 1 refills | Status: DC

## 2022-02-11 MED ORDER — VITAMIN D (ERGOCALCIFEROL) 1.25 MG (50000 UNIT) PO CAPS: 50000.0000 [IU] | ORAL_CAPSULE | ORAL | 1 refills | Status: DC

## 2022-02-11 MED ORDER — BUSPIRONE HCL 5 MG PO TABS: 5.0000 mg | ORAL_TABLET | Freq: Two times a day (BID) | ORAL | 0 refills | Status: DC

## 2022-02-11 MED ORDER — TELMISARTAN-HCTZ 80-25 MG PO TABS: 1.0000 | ORAL_TABLET | Freq: Every day | ORAL | 1 refills | Status: DC

## 2022-02-11 MED ORDER — ROSUVASTATIN CALCIUM 40 MG PO TABS: 40.0000 mg | ORAL_TABLET | Freq: Every day | ORAL | 1 refills | Status: DC

## 2022-02-11 MED ORDER — VENLAFAXINE HCL ER 75 MG PO CP24: 75.0000 mg | ORAL_CAPSULE | Freq: Two times a day (BID) | ORAL | 1 refills | Status: DC

## 2022-02-11 MED ORDER — TRULICITY 3 MG/0.5ML ~~LOC~~ SOAJ: 3.0000 mg | SUBCUTANEOUS | 0 refills | Status: DC

## 2022-02-12 LAB — CBC WITH DIFFERENTIAL/PLATELET
Absolute Monocytes: 462 cells/uL (ref 200–950)
Basophils Absolute: 34 cells/uL (ref 0–200)
Basophils Relative: 0.4 %
Eosinophils Absolute: 302 cells/uL (ref 15–500)
Eosinophils Relative: 3.6 %
HCT: 41.9 % (ref 35.0–45.0)
Hemoglobin: 14.1 g/dL (ref 11.7–15.5)
Lymphs Abs: 2428 cells/uL (ref 850–3900)
MCH: 29.8 pg (ref 27.0–33.0)
MCHC: 33.7 g/dL (ref 32.0–36.0)
MCV: 88.6 fL (ref 80.0–100.0)
MPV: 10.7 fL (ref 7.5–12.5)
Monocytes Relative: 5.5 %
Neutro Abs: 5174 cells/uL (ref 1500–7800)
Neutrophils Relative %: 61.6 %
Platelets: 272 10*3/uL (ref 140–400)
RBC: 4.73 10*6/uL (ref 3.80–5.10)
RDW: 13.3 % (ref 11.0–15.0)
Total Lymphocyte: 28.9 %
WBC: 8.4 10*3/uL (ref 3.8–10.8)

## 2022-02-12 LAB — COMPLETE METABOLIC PANEL WITH GFR
AG Ratio: 2 (calc) (ref 1.0–2.5)
ALT: 11 U/L (ref 6–29)
AST: 13 U/L (ref 10–35)
Albumin: 4.3 g/dL (ref 3.6–5.1)
Alkaline phosphatase (APISO): 104 U/L (ref 37–153)
BUN: 9 mg/dL (ref 7–25)
CO2: 30 mmol/L (ref 20–32)
Calcium: 9.5 mg/dL (ref 8.6–10.4)
Chloride: 105 mmol/L (ref 98–110)
Creat: 0.79 mg/dL (ref 0.50–1.05)
Globulin: 2.2 g/dL (calc) (ref 1.9–3.7)
Glucose, Bld: 87 mg/dL (ref 65–99)
Potassium: 3.4 mmol/L — ABNORMAL LOW (ref 3.5–5.3)
Sodium: 143 mmol/L (ref 135–146)
Total Bilirubin: 0.5 mg/dL (ref 0.2–1.2)
Total Protein: 6.5 g/dL (ref 6.1–8.1)
eGFR: 85 mL/min/{1.73_m2} (ref >=60)

## 2022-02-12 LAB — LIPID PANEL
Cholesterol: 134 mg/dL (ref <200)
HDL: 57 mg/dL (ref >=50)
LDL Cholesterol (Calc): 61 mg/dL (calc)
Non-HDL Cholesterol (Calc): 77 mg/dL (calc) (ref <130)
Total CHOL/HDL Ratio: 2.4 (calc) (ref <5.0)
Triglycerides: 80 mg/dL (ref <150)

## 2022-02-12 LAB — MICROALBUMIN / CREATININE URINE RATIO
Creatinine, Urine: 63 mg/dL (ref 20–275)
Microalb, Ur: <0.2 mg/dL

## 2022-02-12 LAB — VITAMIN B12: Vitamin B-12: 316 pg/mL (ref 200–1100)

## 2022-02-12 LAB — VITAMIN D 25 HYDROXY (VIT D DEFICIENCY, FRACTURES): Vit D, 25-Hydroxy: 52 ng/mL (ref 30–100)

## 2022-03-24 ENCOUNTER — Other Ambulatory Visit: Payer: Self-pay | Admitting: Family Medicine

## 2022-03-24 DIAGNOSIS — Z1231 Encounter for screening mammogram for malignant neoplasm of breast: Secondary | ICD-10-CM

## 2022-03-27 ENCOUNTER — Other Ambulatory Visit: Payer: Self-pay | Admitting: Internal Medicine

## 2022-03-28 NOTE — Progress Notes (Deleted)
Name: Saara Kijowski   MRN: [id number]    DOB: 08/22/1960   Date:03/28/2022       Progress Note  Subjective  Chief Complaint  Annual Exam  HPI  Patient presents for annual CPE.  Diet: *** Exercise: ***  Last Eye Exam: *** Last Dental Exam: ***  Flowsheet Row Office Visit from 10/15/2018 in [facility]  AUDIT-C Score 0      Depression: Phq 9 is  {Desc; negative/positive:13464}    02/11/2022    2:28 PM 08/13/2021    1:56 PM 03/10/2021    2:39 PM 01/27/2021    2:07 PM 12/09/2020    2:44 PM  Depression screen PHQ 2/9  Decreased Interest 3 0 0 0 1  Down, Depressed, Hopeless 0 0 1 0 0  PHQ - 2 Score 3 0 1 0 1  Altered sleeping 0 0 3 0 0  Tired, decreased energy 0 0 3 0 3  Change in appetite 0 0 0 0 3  Feeling bad or failure about yourself  0 0 0 0 0  Trouble concentrating 0 0 0 0 0  Moving slowly or fidgety/restless 0 0 1 0 0  Suicidal thoughts 0 0 0 0 0  PHQ-9 Score 3 0 8 0 7  Difficult doing work/chores Not difficult at all Not difficult at all Not difficult at all Not difficult at all    Hypertension: BP Readings from Last 3 Encounters:  02/11/22 (!) 142/76  08/13/21 120/78  03/10/21 132/84   Obesity: Wt Readings from Last 3 Encounters:  02/11/22 211 lb 9.6 oz (96 kg)  08/13/21 216 lb (98 kg)  03/10/21 223 lb 4.8 oz (101.3 kg)   BMI Readings from Last 3 Encounters:  02/11/22 37.48 kg/m  08/13/21 38.26 kg/m  03/10/21 39.56 kg/m     Vaccines:   HPV: N/A Tdap: up to date Shingrix: 1 in 2022, due to rash declined 2nd Pneumonia: N/A Flu: 2016, due COVID-19: Never   Hep C Screening: 08/13/13 STD testing and prevention (HIV/chl/gon/syphilis): 12/25/14 Intimate partner violence: negative screen  Sexual History : Menstrual History/LMP/Abnormal Bleeding:  Discussed importance of follow up if any post-menopausal bleeding: yes  Incontinence Symptoms: negative for symptoms   Breast cancer:  - Last Mammogram: Scheduled for 05/11/22 - BRCA  gene screening: N/A  Osteoporosis Prevention : Discussed high calcium and vitamin D supplementation, weight bearing exercises Bone density: N/A   Cervical cancer screening: 01/15/19  Skin cancer: Discussed monitoring for atypical lesions  Colorectal cancer: 08/16/16, due   Lung cancer:  Low Dose CT Chest recommended if Age 80-80 years, 65 pack-year currently smoking OR have quit w/in 15years. Patient does not qualify for screen   ECG: 05/16/18  Advanced Care Planning: A voluntary discussion about advance care planning including the explanation and discussion of advance directives.  Discussed health care proxy and Living will, and the patient was able to identify a health care proxy as ***.  Patient does not have a living will and power of attorney of health care   Lipids: Lab Results  Component Value Date   CHOL 134 02/11/2022   CHOL 185 03/10/2021   CHOL 142 03/04/2020   Lab Results  Component Value Date   HDL 57 02/11/2022   HDL 47 (L) 03/10/2021   HDL 47 (L) 03/04/2020   Lab Results  Component Value Date   LDLCALC 61 02/11/2022   LDLCALC 111 (H) 03/10/2021   LDLCALC 74 03/04/2020   Lab Results  Component  Value Date   TRIG 80 02/11/2022   TRIG 155 (H) 03/10/2021   TRIG 119 03/04/2020   Lab Results  Component Value Date   CHOLHDL 2.4 02/11/2022   CHOLHDL 3.9 03/10/2021   CHOLHDL 3.0 03/04/2020   No results found for: "LDLDIRECT"  Glucose: Glucose, Bld  Date Value Ref Range Status  02/11/2022 87 65 - 99 mg/dL Final    Comment:    .            Fasting reference interval .   03/10/2021 98 65 - 99 mg/dL Final    Comment:    .            Fasting reference interval .   03/04/2020 106 (H) 65 - 99 mg/dL Final    Comment:    .            Fasting reference interval . For someone without known diabetes, a glucose value between 100 and 125 mg/dL is consistent with prediabetes and should be confirmed with a follow-up test. .    Glucose-Capillary  Date Value  Ref Range Status  06/04/2018 110 (H) 70 - 99 mg/dL Final  06/03/2018 163 (H) 70 - 99 mg/dL Final  06/03/2018 86 70 - 99 mg/dL Final    Patient Active Problem List   Diagnosis Date Noted   GAD (generalized anxiety disorder) 08/13/2021   Endometrial polyp 11/29/2017   Fibroid 11/29/2017   Post-menopausal bleeding 11/14/2017   Mild nonproliferative diabetic retinopathy of both eyes associated with type 2 diabetes mellitus ([place]) 06/09/2016   Diabetes mellitus type 2 in obese ([place]) 03/01/2016   Vitamin D deficiency 07/07/2015   Vitamin B12 deficiency 07/07/2015   Snoring 12/25/2014   Allergic rhinitis, seasonal 12/14/2014   Chronic constipation 12/14/2014   Diabetes mellitus with renal manifestation ([place]) 12/14/2014   Dyslipidemia 12/14/2014   Dermatitis, eczematoid 12/14/2014   Edema extremities 12/14/2014   Essential (primary) hypertension 12/14/2014   Gastro-esophageal reflux disease without esophagitis 12/14/2014   Herpes simplex vulvovaginitis 12/14/2014   Morbid obesity ([place]) 12/14/2014   Female climacteric state 12/14/2014    Past Surgical History:  Procedure Laterality Date   APPLICATION OF WOUND VAC  04/2018   CESAREAN SECTION  1993   LAPAROTOMY N/A 05/14/2018   Procedure: EXPLORATORY LAPAROTOMY;  Surgeon: Georganna Skeans, MD;  Location: [place];  Service: General;  Laterality: N/A;   LAPAROTOMY N/A 05/16/2018   Procedure: EXPLORATORY LAPAROTOMY with reanatomosis and fascia closure with wound vacum application;  Surgeon: Erroll Luna, MD;  Location: [place];  Service: General;  Laterality: N/A;   tonsillectomy and adnoidectomy     WISDOM TOOTH EXTRACTION      Family History  Problem Relation Age of Onset   Diabetes Mother    Asthma Mother    Heart disease Mother    Heart disease Father    Prostate cancer Father    Other Father        blockages in stomach   Kidney disease Paternal Grandmother    Asthma Son    Autism Son    Breast cancer Neg Hx    Colon cancer  Neg Hx    Esophageal cancer Neg Hx    Pancreatic cancer Neg Hx    Rectal cancer Neg Hx    Stomach cancer Neg Hx     Social History   Socioeconomic History   Marital status: Married    Spouse name: Not on file   Number of children: 1   Years  of education: Not on file   Highest education level: 12th grade  Occupational History    Comment: unemployed   Tobacco Use   Smoking status: Former    Years: 20.00    Types: Cigarettes    Start date: 04/19/1971    Quit date: 04/2018    Years since quitting: 3.9   Smokeless tobacco: Never   Tobacco comments:    she quit and started back October 2019, but quit again 04/2018  Vaping Use   Vaping Use: Never used  Substance and Sexual Activity   Alcohol use: No    Alcohol/week: 0.0 standard drinks of alcohol   Drug use: No   Sexual activity: Yes    Partners: Male    Birth control/protection: Post-menopausal  Other Topics Concern   Not on file  Social History Narrative   She lost her job Dec 2017 , worked for 36 years at the same company .    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   Married and has an adult son that has autism and asthma ( lives with them)    Social Determinants of Health   Financial Resource Strain: Medium Risk (02/06/2018)   Overall Financial Resource Strain (CARDIA)    Difficulty of Paying Living Expenses: Somewhat hard  Food Insecurity: Food Insecurity Present (02/06/2018)   Hunger Vital Sign    Worried About Running Out of Food in the Last Year: Often true    Ran Out of Food in the Last Year: Often true  Transportation Needs: No Transportation Needs (02/06/2018)   PRAPARE - Hydrologist (Medical): No    Lack of Transportation (Non-Medical): No  Physical Activity: Sufficiently Active (01/15/2019)   Exercise Vital Sign    Days of Exercise per Week: 7 days    Minutes of Exercise per Session: 60 min  Stress: No  Stress Concern Present (01/15/2019)   [place]    Feeling of Stress : Not at all  Social Connections: Moderately Integrated (01/15/2019)   Social Connection and Isolation Panel [NHANES]    Frequency of Communication with Friends and Family: Three times a week    Frequency of Social Gatherings with Friends and Family: Once a week    Attends Religious Services: More than 4 times per year    Active Member of [employer] or Organizations: No    Attends Archivist Meetings: Never    Marital Status: Married  Human resources officer Violence: Not At Risk (02/06/2018)   Humiliation, Afraid, Rape, and Kick questionnaire    Fear of Current or Ex-Partner: No    Emotionally Abused: No    Physically Abused: No    Sexually Abused: No     Current Outpatient Medications:    acetaminophen (TYLENOL) 325 MG tablet, Take 2 tablets (650 mg total) by mouth every 6 (six) hours as needed for mild pain or headache., Disp: 25 tablet, Rfl: 0   blood glucose meter kit and supplies, Dispense based on patient and insurance preference. Use up to four times daily as directed. (FOR ICD-10 E10.9, E11.9)., Disp: 1 each, Rfl: 2   Blood Glucose Monitoring Suppl (ONE TOUCH ULTRA 2) w/Device KIT, 1 kit by Subdermal route See admin instructions., Disp: 1 each, Rfl: 0   busPIRone (BUSPAR) 5 MG tablet, Take 1 tablet (5 mg total) by mouth 2 (two) times daily., Disp: 180 tablet,  Rfl: 0   Cholecalciferol (VITAMIN D) 2000 units CAPS, Take 1 capsule (2,000 Units total) by mouth daily., Disp: 30 capsule, Rfl: 0   Dulaglutide (TRULICITY) 61 YO/3.7CH SOPN, Inject 3 mg as directed once a week., Disp: 6 mL, Rfl: 0   empagliflozin (JARDIANCE) 25 MG TABS tablet, Take 1 tablet (25 mg total) by mouth daily before breakfast. In place of synjardi, Disp: 90 tablet, Rfl: 1   glucose blood (ONE TOUCH ULTRA TEST) test strip, CHECK FASTING BLOOD SUGARS TWICE DAILY, Disp: 100 each, Rfl: 2    levocetirizine (XYZAL) 5 MG tablet, Take 1 tablet (5 mg total) by mouth every evening., Disp: 90 tablet, Rfl: 1   ONETOUCH DELICA LANCETS FINE MISC, See admin instructions., Disp: , Rfl: 0   rosuvastatin (CRESTOR) 40 MG tablet, Take 1 tablet (40 mg total) by mouth daily. In place of Atorvastatin, Disp: 90 tablet, Rfl: 1   telmisartan-hydrochlorothiazide (MICARDIS HCT) 80-25 MG tablet, Take 1 tablet by mouth daily., Disp: 90 tablet, Rfl: 1   valACYclovir (VALTREX) 500 MG tablet, TAKE 1 TABLET BY MOUTH 3 TIMES A DAY AS NEEDED FOR PREVENTION.AND DAILY FOR PREVENTION, Disp: 300 tablet, Rfl: 1   venlafaxine XR (EFFEXOR XR) 75 MG 24 hr capsule, Take 1 capsule (75 mg total) by mouth 2 (two) times daily with breakfast and lunch., Disp: 180 capsule, Rfl: 1   Vitamin D, Ergocalciferol, (DRISDOL) 1.25 MG (50000 UNIT) CAPS capsule, Take 1 capsule (50,000 Units total) by mouth every 7 (seven) days., Disp: 12 capsule, Rfl: 1  Allergies  Allergen Reactions   Penicillins Swelling    Did it involve swelling of the face/tongue/throat, SOB, or low BP? Yes Did it involve sudden or severe rash/hives, skin peeling, or any reaction on the inside of your mouth or nose? No Did you need to seek medical attention at a hospital or doctor's office? Yes When did it last happen? unknown If all above answers are "NO", may proceed with cephalosporin use.      ROS  ***  Objective  There were no vitals filed for this visit.  There is no height or weight on file to calculate BMI.  Physical Exam ***  Recent Results (from the past 2160 hour(s))  POCT HgB A1C     Status: Abnormal   Collection Time: 02/11/22  2:33 PM  Result Value Ref Range   Hemoglobin A1C 5.7 (A) 4.0 - 5.6 %   HbA1c POC (<> result, manual entry)     HbA1c, POC (prediabetic range)     HbA1c, POC (controlled diabetic range)    Urine Microalbumin w/creat. ratio     Status: None   Collection Time: 02/11/22  3:39 PM  Result Value Ref Range    Creatinine, Urine 63 20 - 275 mg/dL   Microalb, Ur <0.2 mg/dL    Comment: Reference Range Not established    Microalb Creat Ratio NOTE <30 mcg/mg creat    Comment: NOTE: The urine albumin value is less than  0.2 mg/dL therefore we are unable to calculate  excretion and/or creatinine ratio. . The ADA defines abnormalities in albumin excretion as follows: Marland Kitchen Albuminuria Category        Result (mcg/mg creatinine) . Normal to Mildly increased   <30 Moderately increased         30-299  Severely increased           > OR = 300 . The ADA recommends that at least two of three specimens collected within a 3-6  month period be abnormal before considering a patient to be within a diagnostic category.   COMPLETE METABOLIC PANEL WITH GFR     Status: Abnormal   Collection Time: 02/11/22  3:39 PM  Result Value Ref Range   Glucose, Bld 87 65 - 99 mg/dL    Comment: .            Fasting reference interval .    BUN 9 7 - 25 mg/dL   Creat 0.79 0.50 - 1.05 mg/dL   eGFR 85 > OR = 60 mL/min/1.59m   BUN/Creatinine Ratio SEE NOTE: 6 - 22 (calc)    Comment:    Not Reported: BUN and Creatinine are within    reference range. .    Sodium 143 135 - 146 mmol/L   Potassium 3.4 (L) 3.5 - 5.3 mmol/L   Chloride 105 98 - 110 mmol/L   CO2 30 20 - 32 mmol/L   Calcium 9.5 8.6 - 10.4 mg/dL   Total Protein 6.5 6.1 - 8.1 g/dL   Albumin 4.3 3.6 - 5.1 g/dL   Globulin 2.2 1.9 - 3.7 g/dL (calc)   AG Ratio 2.0 1.0 - 2.5 (calc)   Total Bilirubin 0.5 0.2 - 1.2 mg/dL   Alkaline phosphatase (APISO) 104 37 - 153 U/L   AST 13 10 - 35 U/L   ALT 11 6 - 29 U/L  Lipid panel     Status: None   Collection Time: 02/11/22  3:39 PM  Result Value Ref Range   Cholesterol 134 <200 mg/dL   HDL 57 > OR = 50 mg/dL   Triglycerides 80 <150 mg/dL   LDL Cholesterol (Calc) 61 mg/dL (calc)    Comment: Reference range: <100 . Desirable range <100 mg/dL for primary prevention;   <70 mg/dL for patients with CHD or diabetic patients   with > or = 2 CHD risk factors. .Marland KitchenLDL-C is now calculated using the Martin-Hopkins  calculation, which is a validated novel method providing  better accuracy than the Friedewald equation in the  estimation of LDL-C.  MCresenciano Genreet al. JAnnamaria Helling [id number](45: [phone]  (http://education.[facility].com/faq/FAQ164)    Total CHOL/HDL Ratio 2.4 <5.0 (calc)   Non-HDL Cholesterol (Calc) 77 <130 mg/dL (calc)    Comment: For patients with diabetes plus 1 major ASCVD risk  factor, treating to a non-HDL-C goal of <100 mg/dL  (LDL-C of <70 mg/dL) is considered a therapeutic  option.   CBC with Differential/Platelet     Status: None   Collection Time: 02/11/22  3:39 PM  Result Value Ref Range   WBC 8.4 3.8 - 10.8 Thousand/uL   RBC 4.73 3.80 - 5.10 Million/uL   Hemoglobin 14.1 11.7 - 15.5 g/dL   HCT 41.9 35.0 - 45.0 %   MCV 88.6 80.0 - 100.0 fL   MCH 29.8 27.0 - 33.0 pg   MCHC 33.7 32.0 - 36.0 g/dL   RDW 13.3 11.0 - 15.0 %   Platelets 272 140 - 400 Thousand/uL   MPV 10.7 7.5 - 12.5 fL   Neutro Abs 5,174 1,500 - 7,800 cells/uL   Lymphs Abs 2,428 850 - 3,900 cells/uL   Absolute Monocytes 462 200 - 950 cells/uL   Eosinophils Absolute 302 15 - 500 cells/uL   Basophils Absolute 34 0 - 200 cells/uL   Neutrophils Relative % 61.6 %   Total Lymphocyte 28.9 %   Monocytes Relative 5.5 %   Eosinophils Relative 3.6 %   Basophils Relative 0.4 %  VITAMIN D 25  Hydroxy (Vit-D Deficiency, Fractures)     Status: None   Collection Time: 02/11/22  3:39 PM  Result Value Ref Range   Vit D, 25-Hydroxy 52 30 - 100 ng/mL    Comment: Vitamin D Status         25-OH Vitamin D: . Deficiency:                    <20 ng/mL Insufficiency:             20 - 29 ng/mL Optimal:                 > or = 30 ng/mL . For 25-OH Vitamin D testing on patients on  D2-supplementation and patients for whom quantitation  of D2 and D3 fractions is required, the Quest[facility]ureD(TM) 25-OH VIT D, (D2,D3), LC/MS/MS is recommended:  order  code [phone] (patients >75yr). . See Note 1 . Note 1 . For additional information, please refer to  http://education.[facility].com/faq/FAQ199  (This link is being provided for informational/ educational purposes only.)   Vitamin B12     Status: None   Collection Time: 02/11/22  3:39 PM  Result Value Ref Range   Vitamin B-12 316 200 - 1,100 pg/mL    Comment: . Please Note: Although the reference range for vitamin B12 is [phone] pg/mL, it has been reported that between 5 and 10% of patients with values between 200 and 400 pg/mL may experience neuropsychiatric and hematologic abnormalities due to occult B12 deficiency; less than 1% of patients with values above 400 pg/mL will have symptoms. .      Fall Risk:    02/11/2022    2:28 PM 08/13/2021    1:55 PM 03/10/2021    2:39 PM 01/27/2021    2:07 PM 12/09/2020    2:44 PM  FWatertown Townin the past year? 0 0 0 0 0  Number falls in past yr: 0 0  0 0  Injury with Fall? 0 0  0 0  Risk for fall due to : No Fall Risks    No Fall Risks  Follow up Education provided;Falls evaluation completed;Falls prevention discussed  Falls prevention discussed  Falls prevention discussed     Functional Status Survey:     Assessment & Plan  1. Well adult exam ***   -USPSTF grade A and B recommendations reviewed with patient; age-appropriate recommendations, preventive care, screening tests, etc discussed and encouraged; healthy living encouraged; see AVS for patient education given to patient -Discussed importance of 150 minutes of physical activity weekly, eat two servings of fish weekly, eat one serving of tree nuts ( cashews, pistachios, pecans, almonds..Marland Kitchen every other day, eat 6 servings of fruit/vegetables daily and drink plenty of water and avoid sweet beverages.   -Reviewed Health Maintenance: Yes.

## 2022-03-28 NOTE — Patient Instructions (Incomplete)
Preventive Care 40-61 Years Old, Female Preventive care refers to lifestyle choices and visits with your health care provider that can promote health and wellness. Preventive care visits are also called wellness exams. What can I expect for my preventive care visit? Counseling Your health care provider may ask you questions about your: Medical history, including: Past medical problems. Family medical history. Pregnancy history. Current health, including: Menstrual cycle. Method of birth control. Emotional well-being. Home life and relationship well-being. Sexual activity and sexual health. Lifestyle, including: Alcohol, nicotine or tobacco, and drug use. Access to firearms. Diet, exercise, and sleep habits. Work and work environment. Sunscreen use. Safety issues such as seatbelt and bike helmet use. Physical exam Your health care provider will check your: Height and weight. These may be used to calculate your BMI (body mass index). BMI is a measurement that tells if you are at a healthy weight. Waist circumference. This measures the distance around your waistline. This measurement also tells if you are at a healthy weight and may help predict your risk of certain diseases, such as type 2 diabetes and high blood pressure. Heart rate and blood pressure. Body temperature. Skin for abnormal spots. What immunizations do I need?  Vaccines are usually given at various ages, according to a schedule. Your health care provider will recommend vaccines for you based on your age, medical history, and lifestyle or other factors, such as travel or where you work. What tests do I need? Screening Your health care provider may recommend screening tests for certain conditions. This may include: Lipid and cholesterol levels. Diabetes screening. This is done by checking your blood sugar (glucose) after you have not eaten for a while (fasting). Pelvic exam and Pap test. Hepatitis B test. Hepatitis C  test. HIV (human immunodeficiency virus) test. STI (sexually transmitted infection) testing, if you are at risk. Lung cancer screening. Colorectal cancer screening. Mammogram. Talk with your health care provider about when you should start having regular mammograms. This may depend on whether you have a family history of breast cancer. BRCA-related cancer screening. This may be done if you have a family history of breast, ovarian, tubal, or peritoneal cancers. Bone density scan. This is done to screen for osteoporosis. Talk with your health care provider about your test results, treatment options, and if necessary, the need for more tests. Follow these instructions at home: Eating and drinking  Eat a diet that includes fresh fruits and vegetables, whole grains, lean protein, and low-fat dairy products. Take vitamin and mineral supplements as recommended by your health care provider. Do not drink alcohol if: Your health care provider tells you not to drink. You are pregnant, may be pregnant, or are planning to become pregnant. If you drink alcohol: Limit how much you have to 0-1 drink a day. Know how much alcohol is in your drink. In the [place] one drink equals one 12 oz bottle of beer (355 mL), one 5 oz glass of wine (148 mL), or one 1 oz glass of hard liquor (44 mL). Lifestyle Brush your teeth every morning and night with fluoride toothpaste. Floss one time each day. Exercise for at least 30 minutes 5 or more days each week. Do not use any products that contain nicotine or tobacco. These products include cigarettes, chewing tobacco, and vaping devices, such as e-cigarettes. If you need help quitting, ask your health care provider. Do not use drugs. If you are sexually active, practice safe sex. Use a condom or other form of protection to   prevent STIs. If you do not wish to become pregnant, use a form of birth control. If you plan to become pregnant, see your health care provider for a  prepregnancy visit. Take aspirin only as told by your health care provider. Make sure that you understand how much to take and what form to take. Work with your health care provider to find out whether it is safe and beneficial for you to take aspirin daily. Find healthy ways to manage stress, such as: Meditation, yoga, or listening to music. Journaling. Talking to a trusted person. Spending time with friends and family. Minimize exposure to UV radiation to reduce your risk of skin cancer. Safety Always wear your seat belt while driving or riding in a vehicle. Do not drive: If you have been drinking alcohol. Do not ride with someone who has been drinking. When you are tired or distracted. While texting. If you have been using any mind-altering substances or drugs. Wear a helmet and other protective equipment during sports activities. If you have firearms in your house, make sure you follow all gun safety procedures. Seek help if you have been physically or sexually abused. What's next? Visit your health care provider once a year for an annual wellness visit. Ask your health care provider how often you should have your eyes and teeth checked. Stay up to date on all vaccines. This information is not intended to replace advice given to you by your health care provider. Make sure you discuss any questions you have with your health care provider. Document Revised: 09/30/2020 Document Reviewed: 09/30/2020 Elsevier Patient Education  [place].

## 2022-03-28 NOTE — Telephone Encounter (Signed)
Requested Prescriptions  Pending Prescriptions Disp Refills   levocetirizine (XYZAL) 5 MG tablet [Pharmacy Med Name: LEVOCETIRIZINE 5 MG TABLET] 90 tablet 0    Sig: TAKE 1 TABLET BY MOUTH EVERY DAY IN THE EVENING     Ear, Nose, and Throat:  Antihistamines - levocetirizine dihydrochloride Passed - 03/27/2022  1:22 AM      Passed - Cr in normal range and within 360 days    Creat  Date Value Ref Range Status  02/11/2022 0.79 0.50 - 1.05 mg/dL Final   Creatinine, Urine  Date Value Ref Range Status  02/11/2022 63 20 - 275 mg/dL Final         Passed - eGFR is 10 or above and within 360 days    GFR, Est African American  Date Value Ref Range Status  03/04/2020 91 > OR = 60 mL/min/1.35m Final   GFR, Est Non African American  Date Value Ref Range Status  03/04/2020 78 > OR = 60 mL/min/1.750mFinal   eGFR  Date Value Ref Range Status  02/11/2022 85 > OR = 60 mL/min/1.7372minal         Passed - Valid encounter within last 12 months    Recent Outpatient Visits           1 month ago Type 2 [facility]   7 months ago Essential (primary) hypertension   [facility]   1 year ago [facility] type 2 [facility]   1 year ago Viral illness   [facility]   1 year ago [facility] type 2 [facility]       Future Appointments             Tomorrow SowSteele SizerD [facility] HospitalEC[facility] JohnsonIn 2 months SowSteele SizerD [facility]

## 2022-03-29 ENCOUNTER — Encounter: Payer: 59 | Admitting: Family Medicine

## 2022-03-29 DIAGNOSIS — Z1211 Encounter for screening for malignant neoplasm of colon: Secondary | ICD-10-CM

## 2022-03-29 DIAGNOSIS — Z0000 Encounter for general adult medical examination without abnormal findings: Secondary | ICD-10-CM

## 2022-06-09 ENCOUNTER — Other Ambulatory Visit: Payer: Self-pay | Admitting: Family Medicine

## 2022-06-09 DIAGNOSIS — F411 Generalized anxiety disorder: Secondary | ICD-10-CM

## 2022-06-09 DIAGNOSIS — E669 Obesity, unspecified: Secondary | ICD-10-CM

## 2022-06-17 NOTE — Progress Notes (Unsigned)
Name: Wendy [name]   MRN: [id number]    DOB: November 04, 1960   Date:06/20/2022       Progress Note  Subjective  Chief Complaint  Follow Up  HPI  DM with nephropathy, retinopathy:  hgbA1C went up from  7.1% to 7.7% ,  7.5%., 8.1 % , 9.7 ,8.2% , 7.3%  7.6% ,8.5%, 7.4% , 8.4 %, 7.7 % up to  8.4 % ,7.1 %  6.3 % and 6 %  down to 5.7 % and today is 7.4 % however she states not taking Jardiance - never picked up from pharmacy. Last visit we switched from Synjardi to [place] since she having bloating and A1C was low.  She denies polyphagia, polydipsia or polyuria. She has retinopathy and reminded her to keep up with eye exam. She also has dyslipidemia, HTN, obesity .She gained 20 lbs since last visit   HTN: today she has been compliant with Telmisartan. HCTZ, BP is at goal, denies chest pain, palpitation or sob    Hyperlipidemia: she is back on Atorvastatin and last LDL was back to normal at 61. Continue medication   Morbid obesity: eating smaller portions, more salads during the Summer she was losing weight, however since we stopped synjardi she gained 20 lbs. She needs to resume [employer] and GAD: she has been on Effexor,she states it helps with hot flashes, she can only tolerate effexor one in am and one at lunch , but not 150 mg  because it makes her feel sick .   Abdominal cramping weekly, bloating daily and is due for colonoscopy , she would like to go to [facility]   Patient Active Problem List   Diagnosis Date Noted   GAD (generalized anxiety disorder) 08/13/2021   Endometrial polyp 11/29/2017   Fibroid 11/29/2017   Post-menopausal bleeding 11/14/2017   Mild nonproliferative diabetic retinopathy of both eyes associated with type 2 diabetes mellitus ([place]) 06/09/2016   Diabetes mellitus type 2 in obese ([place]) 03/01/2016   Vitamin D deficiency 07/07/2015   Vitamin B12 deficiency 07/07/2015   Snoring 12/25/2014   Allergic rhinitis, seasonal 12/14/2014   Chronic  constipation 12/14/2014   Diabetes mellitus with renal manifestation ([place]) 12/14/2014   Dyslipidemia 12/14/2014   Dermatitis, eczematoid 12/14/2014   Edema extremities 12/14/2014   Essential (primary) hypertension 12/14/2014   Gastro-esophageal reflux disease without esophagitis 12/14/2014   Herpes simplex vulvovaginitis 12/14/2014   Morbid obesity ([place]) 12/14/2014   Female climacteric state 12/14/2014    Past Surgical History:  Procedure Laterality Date   APPLICATION OF WOUND VAC  04/2018   CESAREAN SECTION  1993   LAPAROTOMY N/A 05/14/2018   Procedure: EXPLORATORY LAPAROTOMY;  Surgeon: Georganna Skeans, MD;  Location: [place];  Service: General;  Laterality: N/A;   LAPAROTOMY N/A 05/16/2018   Procedure: EXPLORATORY LAPAROTOMY with reanatomosis and fascia closure with wound vacum application;  Surgeon: Erroll Luna, MD;  Location: [place];  Service: General;  Laterality: N/A;   tonsillectomy and adnoidectomy     WISDOM TOOTH EXTRACTION      Family History  Problem Relation Age of Onset   Diabetes Mother    Asthma Mother    Heart disease Mother    Heart disease Father    Prostate cancer Father    Other Father        blockages in stomach   Kidney disease Paternal Grandmother    Asthma Son    Autism Son    Breast cancer Neg Hx  Colon cancer Neg Hx    Esophageal cancer Neg Hx    Pancreatic cancer Neg Hx    Rectal cancer Neg Hx    Stomach cancer Neg Hx     Social History   Tobacco Use   Smoking status: Former    Years: 20.00    Types: Cigarettes    Start date: 04/19/1971    Quit date: 04/2018    Years since quitting: 4.1   Smokeless tobacco: Never   Tobacco comments:    she quit and started back October 2019, but quit again 04/2018  Substance Use Topics   Alcohol use: No    Alcohol/week: 0.0 standard drinks of alcohol     Current Outpatient Medications:    acetaminophen (TYLENOL) 325 MG tablet, Take 2 tablets (650 mg total) by mouth every 6 (six) hours as needed  for mild pain or headache., Disp: 25 tablet, Rfl: 0   blood glucose meter kit and supplies, Dispense based on patient and insurance preference. Use up to four times daily as directed. (FOR ICD-10 E10.9, E11.9)., Disp: 1 each, Rfl: 2   Blood Glucose Monitoring Suppl (ONE TOUCH ULTRA 2) w/Device KIT, 1 kit by Subdermal route See admin instructions., Disp: 1 each, Rfl: 0   busPIRone (BUSPAR) 5 MG tablet, Take 1 tablet (5 mg total) by mouth 2 (two) times daily., Disp: 180 tablet, Rfl: 0   Cholecalciferol (VITAMIN D) 2000 units CAPS, Take 1 capsule (2,000 Units total) by mouth daily., Disp: 30 capsule, Rfl: 0   Dulaglutide (TRULICITY) 3 0000000 SOPN, INJECT 3 MG AS DIRECTED ONCE A WEEK., Disp: 9 mL, Rfl: 0   empagliflozin (JARDIANCE) 25 MG TABS tablet, Take 1 tablet (25 mg total) by mouth daily before breakfast. In place of synjardi, Disp: 90 tablet, Rfl: 1   glucose blood (ONE TOUCH ULTRA TEST) test strip, CHECK FASTING BLOOD SUGARS TWICE DAILY, Disp: 100 each, Rfl: 2   levocetirizine (XYZAL) 5 MG tablet, TAKE 1 TABLET BY MOUTH EVERY DAY IN THE EVENING, Disp: 90 tablet, Rfl: 0   ONETOUCH DELICA LANCETS FINE MISC, See admin instructions., Disp: , Rfl: 0   rosuvastatin (CRESTOR) 40 MG tablet, Take 1 tablet (40 mg total) by mouth daily. In place of Atorvastatin, Disp: 90 tablet, Rfl: 1   telmisartan-hydrochlorothiazide (MICARDIS HCT) 80-25 MG tablet, Take 1 tablet by mouth daily., Disp: 90 tablet, Rfl: 1   valACYclovir (VALTREX) 500 MG tablet, TAKE 1 TABLET BY MOUTH 3 TIMES A DAY AS NEEDED FOR PREVENTION.AND DAILY FOR PREVENTION, Disp: 300 tablet, Rfl: 1   venlafaxine XR (EFFEXOR-XR) 75 MG 24 hr capsule, TAKE 1 CAPSULE BY MOUTH DAILY WITH BREAKFAST., Disp: 90 capsule, Rfl: 1   Vitamin D, Ergocalciferol, (DRISDOL) 1.25 MG (50000 UNIT) CAPS capsule, Take 1 capsule (50,000 Units total) by mouth every 7 (seven) days., Disp: 12 capsule, Rfl: 1  Allergies  Allergen Reactions   Penicillins Swelling    Did it  involve swelling of the face/tongue/throat, SOB, or low BP? Yes Did it involve sudden or severe rash/hives, skin peeling, or any reaction on the inside of your mouth or nose? No Did you need to seek medical attention at a hospital or doctor's office? Yes When did it last happen? unknown If all above answers are "NO", may proceed with cephalosporin use.     I personally reviewed active problem list, medication list, allergies, family history, social history, health maintenance with the patient/caregiver today.   ROS  Constitutional: Negative for fever , positive for  weight change.  Respiratory: Negative for cough and shortness of breath.   Cardiovascular: Negative for chest pain or palpitations.  Gastrointestinal: Negative for abdominal pain, no bowel changes.  Musculoskeletal: Negative for gait problem or joint swelling.  Skin: Negative for rash.  Neurological: Negative for dizziness or headache.  No other specific complaints in a complete review of systems (except as listed in HPI above).   Objective  Vitals:   06/20/22 1459  BP: 128/74  Pulse: 95  Resp: 16  Temp: 98.1 F (36.7 C)  TempSrc: Oral  SpO2: 97%  Weight: 232 lb 3.2 oz (105.3 kg)  Height: '5\' 3"'$  (1.6 m)    Body mass index is 41.13 kg/m.  Physical Exam  Constitutional: Patient appears well-developed and well-nourished. Obese  No distress.  HEENT: head atraumatic, normocephalic, pupils equal and reactive to light, neck supple Cardiovascular: Normal rate, regular rhythm and normal heart sounds.  No murmur heard. No BLE edema. Pulmonary/Chest: Effort normal and breath sounds normal. No respiratory distress. Abdominal: Soft.  There is no tenderness. Psychiatric: Patient has a normal mood and affect. behavior is normal. Judgment and thought content normal.   Recent Results (from the past 2160 hour(s))  POCT HgB A1C     Status: Abnormal   Collection Time: 06/20/22  3:02 PM  Result Value Ref Range   Hemoglobin  A1C 7.4 (A) 4.0 - 5.6 %   HbA1c POC (<> result, manual entry)     HbA1c, POC (prediabetic range)     HbA1c, POC (controlled diabetic range)       PHQ2/9:    06/20/2022    3:00 PM 02/11/2022    2:28 PM 08/13/2021    1:56 PM 03/10/2021    2:39 PM 01/27/2021    2:07 PM  Depression screen PHQ 2/9  Decreased Interest 3 3 0 0 0  Down, Depressed, Hopeless 3 0 0 1 0  PHQ - 2 Score 6 3 0 1 0  Altered sleeping 3 0 0 3 0  Tired, decreased energy 3 0 0 3 0  Change in appetite 1 0 0 0 0  Feeling bad or failure about yourself  0 0 0 0 0  Trouble concentrating 0 0 0 0 0  Moving slowly or fidgety/restless 0 0 0 1 0  Suicidal thoughts 0 0 0 0 0  PHQ-9 Score 13 3 0 8 0  Difficult doing work/chores Very difficult Not difficult at all Not difficult at all Not difficult at all Not difficult at all    phq 9 is positive   Fall Risk:    06/20/2022    3:00 PM 02/11/2022    2:28 PM 08/13/2021    1:55 PM 03/10/2021    2:39 PM 01/27/2021    2:07 PM  Fall Risk   Falls in the past year? 0 0 0 0 0  Number falls in past yr:  0 0  0  Injury with Fall?  0 0  0  Risk for fall due to : No Fall Risks No Fall Risks     Follow up Falls prevention discussed;Education provided;Falls evaluation completed Education provided;Falls evaluation completed;Falls prevention discussed  Falls prevention discussed       Functional Status Survey: Is the patient deaf or have difficulty hearing?: Yes Does the patient have difficulty seeing, even when wearing glasses/contacts?: No Does the patient have difficulty concentrating, remembering, or making decisions?: No Does the patient have difficulty walking or climbing stairs?: Yes Does the patient have difficulty  dressing or bathing?: No Does the patient have difficulty doing errands alone such as visiting a doctor's office or shopping?: No    Assessment & Plan  1. Type 2 diabetes with nephropathy (HCC)  - POCT HgB A1C A1C is higher, she will get Jardiance rx that  is at the pharmacy  2. Morbid obesity ([place])  Discussed with the patient the risk posed by an increased BMI. Discussed importance of portion control, calorie counting and at least 150 minutes of physical activity weekly. Avoid sweet beverages and drink more water. Eat at least 6 servings of fruit and vegetables daily    3. Essential (primary) hypertension  At goal   4. Abdominal cramping  - Ambulatory referral to Gastroenterology  5. Breast cancer screening by mammogram  - MM 3D SCREEN BREAST BILATERAL; Future  6. Colon cancer screening  - Ambulatory referral to Gastroenterology  7. Bloating  - Ambulatory referral to Gastroenterology

## 2022-06-20 ENCOUNTER — Encounter: Payer: Self-pay | Admitting: Family Medicine

## 2022-06-20 ENCOUNTER — Ambulatory Visit: Payer: 59 | Admitting: Family Medicine

## 2022-06-20 VITALS — BP 128/74 | HR 95 | Temp 98.1°F | Resp 16 | Ht 63.0 in | Wt 232.2 lb

## 2022-06-20 DIAGNOSIS — R140 Abdominal distension (gaseous): Secondary | ICD-10-CM

## 2022-06-20 DIAGNOSIS — Z1231 Encounter for screening mammogram for malignant neoplasm of breast: Secondary | ICD-10-CM

## 2022-06-20 DIAGNOSIS — Z1211 Encounter for screening for malignant neoplasm of colon: Secondary | ICD-10-CM

## 2022-06-20 DIAGNOSIS — E1121 Type 2 diabetes mellitus with diabetic nephropathy: Secondary | ICD-10-CM

## 2022-06-20 DIAGNOSIS — R109 Unspecified abdominal pain: Secondary | ICD-10-CM | POA: Diagnosis not present

## 2022-06-20 DIAGNOSIS — I10 Essential (primary) hypertension: Secondary | ICD-10-CM

## 2022-06-20 LAB — POCT GLYCOSYLATED HEMOGLOBIN (HGB A1C): Hemoglobin A1C: 7.4 % — AB (ref 4.0–5.6)

## 2022-06-20 NOTE — Patient Instructions (Signed)
[facility] [phone]

## 2022-06-21 ENCOUNTER — Other Ambulatory Visit: Payer: Self-pay | Admitting: Family Medicine

## 2022-06-21 ENCOUNTER — Encounter: Payer: Self-pay | Admitting: Gastroenterology

## 2022-06-21 DIAGNOSIS — E1121 Type 2 diabetes mellitus with diabetic nephropathy: Secondary | ICD-10-CM

## 2022-06-21 DIAGNOSIS — I10 Essential (primary) hypertension: Secondary | ICD-10-CM

## 2022-06-21 DIAGNOSIS — E1169 Type 2 diabetes mellitus with other specified complication: Secondary | ICD-10-CM

## 2022-06-21 DIAGNOSIS — F411 Generalized anxiety disorder: Secondary | ICD-10-CM

## 2022-06-21 MED ORDER — BUSPIRONE HCL 5 MG PO TABS: 5.0000 mg | ORAL_TABLET | Freq: Two times a day (BID) | ORAL | 0 refills | Status: DC

## 2022-06-21 MED ORDER — EMPAGLIFLOZIN 25 MG PO TABS: 25.0000 mg | ORAL_TABLET | Freq: Every day | ORAL | 1 refills | Status: DC

## 2022-06-21 MED ORDER — LEVOCETIRIZINE DIHYDROCHLORIDE 5 MG PO TABS: 5.0000 mg | ORAL_TABLET | Freq: Every evening | ORAL | 0 refills | Status: DC

## 2022-06-21 NOTE — Telephone Encounter (Signed)
Requested Prescriptions  Pending Prescriptions Disp Refills   venlafaxine XR (EFFEXOR-XR) 75 MG 24 hr capsule 90 capsule 1    Sig: Take 1 capsule (75 mg total) by mouth daily with breakfast.     Psychiatry: Antidepressants - SNRI - desvenlafaxine & venlafaxine Failed - 06/21/2022 11:57 AM      Failed - Lipid Panel in normal range within the last 12 months    Cholesterol, Total  Date Value Ref Range Status  12/25/2014 140 100 - 199 mg/dL Final   Cholesterol  Date Value Ref Range Status  02/11/2022 134 <200 mg/dL Final   LDL Cholesterol (Calc)  Date Value Ref Range Status  02/11/2022 61 mg/dL (calc) Final    Comment:    Reference range: <100 . Desirable range <100 mg/dL for primary prevention;   <70 mg/dL for patients with CHD or diabetic patients  with > or = 2 CHD risk factors. Marland Kitchen LDL-C is now calculated using the Martin-Hopkins  calculation, which is a validated novel method providing  better accuracy than the Friedewald equation in the  estimation of LDL-C.  Cresenciano Genre et al. Annamaria Helling. [id number]): 2061-2068  (http://education.[employer].com/faq/FAQ164)    HDL  Date Value Ref Range Status  02/11/2022 57 > OR = 50 mg/dL Final  12/25/2014 48 >39 mg/dL Final    Comment:    According to ATP-III Guidelines, HDL-C >59 mg/dL is considered a negative risk factor for CHD.    Triglycerides  Date Value Ref Range Status  02/11/2022 80 <150 mg/dL Final         Passed - Cr in normal range and within 360 days    Creat  Date Value Ref Range Status  02/11/2022 0.79 0.50 - 1.05 mg/dL Final   Creatinine, Urine  Date Value Ref Range Status  02/11/2022 63 20 - 275 mg/dL Final         Passed - Last BP in normal range    BP Readings from Last 1 Encounters:  06/20/22 128/74         Passed - Valid encounter within last 6 months    Recent Outpatient Visits           Yesterday Type 2 diabetes with nephropathy [facility])   [facility] Steele Sizer,  MD   4 months ago Type 2 diabetes with nephropathy [facility])   [facility] Steele Sizer, MD   10 months ago Essential (primary) hypertension   [facility] Teodora Medici, DO   1 year ago Diabetes mellitus type 2 in obese [facility])   [facility] Steele Sizer, MD   1 year ago Viral illness   [facility] Delsa Grana, PA-C       Future Appointments             In 3 months Steele Sizer, MD [facility]             telmisartan-hydrochlorothiazide (MICARDIS HCT) 80-25 MG tablet 90 tablet 1    Sig: Take 1 tablet by mouth daily.     Cardiovascular: ARB + Diuretic Combos Failed - 06/21/2022 11:57 AM      Failed - K in normal range and within 180 days    Potassium  Date Value Ref Range Status  02/11/2022 3.4 (L) 3.5 - 5.3 mmol/L Final         Passed - Na in normal range and within 180 days  Sodium  Date Value Ref Range Status  02/11/2022 143 135 - 146 mmol/L Final  12/25/2014 144 134 - 144 mmol/L Final         Passed - Cr in normal range and within 180 days    Creat  Date Value Ref Range Status  02/11/2022 0.79 0.50 - 1.05 mg/dL Final   Creatinine, Urine  Date Value Ref Range Status  02/11/2022 63 20 - 275 mg/dL Final         Passed - eGFR is 10 or above and within 180 days    GFR, Est African American  Date Value Ref Range Status  03/04/2020 91 > OR = 60 mL/min/1.20m Final   GFR, Est Non African American  Date Value Ref Range Status  03/04/2020 78 > OR = 60 mL/min/1.710mFinal   eGFR  Date Value Ref Range Status  02/11/2022 85 > OR = 60 mL/min/1.73110minal         Passed - Patient is not pregnant      Passed - Last BP in normal range    BP Readings from Last 1 Encounters:  06/20/22 128/74         Passed - Valid encounter within last 6 months    Recent Outpatient Visits           Yesterday Type 2 diabetes with  nephropathy ([facility]   4 months ago Type 2 diabetes with nephropathy ([facility] ConFore[facility] Centerw[facility] S[facility]D   10 months ago Essential (primary) hypertension   [facility] CenterdTe[facility] MediciO   1 year ago Diabetes mellitus type 2 in obese ([facility] ConTr[facility] Centerw[facility]le SizerD   1 year ago Viral illness   ConDunean [facility] CenterpDelsa GranaA-C       Future Appointments             In 3 months SowSteele SizerD ConHouston [facility] CenterEC             Dulaglutide (TRULICITY) 3 MG/0000000PN 9 mL 0    Sig: Inject 3 mg as directed once a week.     Endocrinology:  Diabetes - GLP-1 Receptor Agonists Passed - 06/21/2022 11:57 AM      Passed - HBA1C is between 0 and 7.9 and within 180 days    Hemoglobin A1C  Date Value Ref Range Status  06/20/2022 7.4 (A) 4.0 - 5.6 % Final   HbA1c, POC (controlled diabetic range)  Date Value Ref Range Status  06/19/2019 7.4 (A) 0.0 - 7.0 % Final         Passed - Valid encounter within last 6 months    Recent Outpatient Visits           Yesterday Type 2 diabetes with nephropathy ([facility]   4 months ago Type 2 diabetes with nephropathy ([facility] ConCarters[facility] CenterwSteele SizerD   10 months ago Essential (primary) hypertension   ConMortonO   1 year ago Diabetes mellitus type 2 in obese ([facility] ConL[facility] CenterwS[facility]le SizerD   1 year ago Viral illness   ConFloyd [facility] CenterpDelsa GranaA-[place]    Future Appointments  In 3 months Steele Sizer, MD [facility]            Signed Prescriptions Disp Refills   busPIRone (BUSPAR) 5 MG  tablet 180 tablet 0    Sig: Take 1 tablet (5 mg total) by mouth 2 (two) times daily.     Psychiatry: Anxiolytics/Hypnotics - Non-controlled Passed - 06/21/2022 11:57 AM      Passed - Valid encounter within last 12 months    Recent Outpatient Visits           Yesterday Type 2 diabetes with nephropathy [name])   [facility] [place], Drue Stager, MD   4 months ago Type 2 diabetes with nephropathy [facility])   [facility] Steele Sizer, MD   10 months ago Essential (primary) hypertension   [facility] Teodora Medici, DO   1 year ago Diabetes mellitus type 2 in obese [facility])   [facility] Steele Sizer, MD   1 year ago Viral illness   [facility] Delsa Grana, PA-C       Future Appointments             In 3 months Steele Sizer, MD [facility]             levocetirizine (XYZAL) 5 MG tablet 90 tablet 0    Sig: Take 1 tablet (5 mg total) by mouth every evening. TAKE 1 TABLET BY MOUTH EVERY DAY IN THE EVENING     Ear, Nose, and Throat:  Antihistamines - levocetirizine dihydrochloride Passed - 06/21/2022 11:57 AM      Passed - Cr in normal range and within 360 days    Creat  Date Value Ref Range Status  02/11/2022 0.79 0.50 - 1.05 mg/dL Final   Creatinine, Urine  Date Value Ref Range Status  02/11/2022 63 20 - 275 mg/dL Final         Passed - eGFR is 10 or above and within 360 days    GFR, Est African American  Date Value Ref Range Status  03/04/2020 91 > OR = 60 mL/min/1.43m Final   GFR, Est Non African American  Date Value Ref Range Status  03/04/2020 78 > OR = 60 mL/min/1.754mFinal   eGFR  Date Value Ref Range Status  02/11/2022 85 > OR = 60 mL/min/1.7364minal         Passed - Valid encounter within last 12 months    Recent Outpatient Visits           Yesterday Type 2 diabetes with nephropathy ([facility]  [facility] Centerw[facility] SizerD   4 months ago Type 2 diabetes with nephropathy ([facility] ConDari[facility] CenterwSteele SizerD   10 months ago Essential (primary) hypertension   ConCollingsworthO   1 year ago Diabetes mellitus type 2 in obese ([facility] ConKibler [facility] CenterwSteele SizerD   1 year ago Viral illness   ConTruesdale Medical CenterpDelsa GranaA-C       Future Appointments             In 3 months SowSteele SizerD ConDo[facility] Surgery [facility] PaEC             empagliflozin (JARDIANCE) 25 MG TABS tablet 90 tablet 1  Sig: Take 1 tablet (25 mg total) by mouth daily before breakfast. In place of synjardi     Endocrinology:  Diabetes - SGLT2 Inhibitors Passed - 06/21/2022 11:57 AM      Passed - Cr in normal range and within 360 days    Creat  Date Value Ref Range Status  02/11/2022 0.79 0.50 - 1.05 mg/dL Final   Creatinine, Urine  Date Value Ref Range Status  02/11/2022 63 20 - 275 mg/dL Final         Passed - HBA1C is between 0 and 7.9 and within 180 days    Hemoglobin A1C  Date Value Ref Range Status  06/20/2022 7.4 (A) 4.0 - 5.6 % Final   HbA1c, POC (controlled diabetic range)  Date Value Ref Range Status  06/19/2019 7.4 (A) 0.0 - 7.0 % Final         Passed - eGFR in normal range and within 360 days    GFR, Est African American  Date Value Ref Range Status  03/04/2020 91 > OR = 60 mL/min/1.76m Final   GFR, Est Non African American  Date Value Ref Range Status  03/04/2020 78 > OR = 60 mL/min/1.748mFinal   eGFR  Date Value Ref Range Status  02/11/2022 85 > OR = 60 mL/min/1.7385minal         Passed - Valid encounter within last 6 months    Recent Outpatient Visits           Yesterday Type 2 diabetes with nephropathy ([facility] ConSt[facility] CenterwSteele SizerD   4 months ago Type  2 diabetes with nephropathy ([facility] ConGrand Point [facility] CenterwSteele SizerD   10 months ago Essential (primary) hypertension   ConEminenceO   1 year ago Diabetes mellitus type 2 in obese (HC[facility] ConTimber Lake Medical CenterwSteele SizerD   1 year ago Viral illness   ConSimonton Lake Medical CenterpDelsa GranaA-[place]    Future Appointments             In 3 months SowSteele SizerD ConMetropolitano Psiquiatric[facility] Cabo RojoECCalifornia Rehabilitation Institute, [facility]

## 2022-06-21 NOTE — Telephone Encounter (Signed)
Requested Prescriptions  Pending Prescriptions Disp Refills   venlafaxine XR (EFFEXOR-XR) 75 MG 24 hr capsule 90 capsule 1    Sig: Take 1 capsule (75 mg total) by mouth daily with breakfast.     Psychiatry: Antidepressants - SNRI - desvenlafaxine & venlafaxine Failed - 06/21/2022 11:57 AM      Failed - Lipid Panel in normal range within the last 12 months    Cholesterol, Total  Date Value Ref Range Status  12/25/2014 140 100 - 199 mg/dL Final   Cholesterol  Date Value Ref Range Status  02/11/2022 134 <200 mg/dL Final   LDL Cholesterol (Calc)  Date Value Ref Range Status  02/11/2022 61 mg/dL (calc) Final    Comment:    Reference range: <100 . Desirable range <100 mg/dL for primary prevention;   <70 mg/dL for patients with CHD or diabetic patients  with > or = 2 CHD risk factors. Marland Kitchen LDL-C is now calculated using the Martin-Hopkins  calculation, which is a validated novel method providing  better accuracy than the Friedewald equation in the  estimation of LDL-C.  Cresenciano Genre et al. Annamaria Helling. [id number]): 2061-2068  (http://education.[facility].com/faq/FAQ164)    HDL  Date Value Ref Range Status  02/11/2022 57 > OR = 50 mg/dL Final  12/25/2014 48 >39 mg/dL Final    Comment:    According to ATP-III Guidelines, HDL-C >59 mg/dL is considered a negative risk factor for CHD.    Triglycerides  Date Value Ref Range Status  02/11/2022 80 <150 mg/dL Final         Passed - Cr in normal range and within 360 days    Creat  Date Value Ref Range Status  02/11/2022 0.79 0.50 - 1.05 mg/dL Final   Creatinine, Urine  Date Value Ref Range Status  02/11/2022 63 20 - 275 mg/dL Final         Passed - Last BP in normal range    BP Readings from Last 1 Encounters:  06/20/22 128/74         Passed - Valid encounter within last 6 months    Recent Outpatient Visits           Yesterday Type 2 diabetes with nephropathy [facility])   [facility] Steele Sizer,  MD   4 months ago Type 2 diabetes with nephropathy [facility])   [facility] Steele Sizer, MD   10 months ago Essential (primary) hypertension   [facility] Teodora Medici, DO   1 year ago Diabetes mellitus type 2 in obese [facility])   [facility] Steele Sizer, MD   1 year ago Viral illness   [facility] Delsa Grana, PA-C       Future Appointments             In 3 months Steele Sizer, MD [facility], [facility]             telmisartan-hydrochlorothiazide (MICARDIS HCT) 80-25 MG tablet 90 tablet 1    Sig: Take 1 tablet by mouth daily.     Cardiovascular: ARB + Diuretic Combos Failed - 06/21/2022 11:57 AM      Failed - K in normal range and within 180 days    Potassium  Date Value Ref Range Status  02/11/2022 3.4 (L) 3.5 - 5.3 mmol/L Final         Passed - Na in normal range and within 180 days  Sodium  Date Value Ref Range Status  02/11/2022 143 135 - 146 mmol/L Final  12/25/2014 144 134 - 144 mmol/L Final         Passed - Cr in normal range and within 180 days    Creat  Date Value Ref Range Status  02/11/2022 0.79 0.50 - 1.05 mg/dL Final   Creatinine, Urine  Date Value Ref Range Status  02/11/2022 63 20 - 275 mg/dL Final         Passed - eGFR is 10 or above and within 180 days    GFR, Est African American  Date Value Ref Range Status  03/04/2020 91 > OR = 60 mL/min/1.71m Final   GFR, Est Non African American  Date Value Ref Range Status  03/04/2020 78 > OR = 60 mL/min/1.754mFinal   eGFR  Date Value Ref Range Status  02/11/2022 85 > OR = 60 mL/min/1.7353minal         Passed - Patient is not pregnant      Passed - Last BP in normal range    BP Readings from Last 1 Encounters:  06/20/22 128/74         Passed - Valid encounter within last 6 months    Recent Outpatient Visits           Yesterday Type 2 diabetes with  nephropathy ([facility]ue Stager[facility]   4 months ago Type 2 diabetes with nephropathy ([facility] SizerD   10 months ago Essential (primary) hypertension   [facility] Centerd[facility] MediciO   1 year ago Diabetes mellitus type 2 in obese ([facility] S[facility]D   1 year ago Viral illness   [facility] CenterpDelsa GranaA-C       Future Appointments             In 3 months SowSteele SizerD [facility]             busPIRone (BUSPAR) 5 MG tablet 180 tablet 0    Sig: Take 1 tablet (5 mg total) by mouth 2 (two) times daily.     Psychiatry: Anxiolytics/Hypnotics - Non-controlled Passed - 06/21/2022 11:57 AM      Passed - Valid encounter within last 12 months    Recent Outpatient Visits           Yesterday Type 2 diabetes with nephropathy ([facility] Center[facility] SizerD   4 months ago Type 2 diabetes with nephropathy ([facility]Sh[facility] Center[facility] SizerD   10 months ago Essential (primary) hypertension   ConMa[facility] CenterdTeodora MediciO   1 year ago Diabetes mellitus type 2 in obese ([facility] ConMage[facility] CenterwSteele SizerD   1 year ago Viral illness   ConSlipp[facility] CenterpDelsa GranaA-C       Future Appointments             In 3 months SowSteele SizerD [facility]             Dulaglutide (TRULICITY) 3 MG/0000000PN 9 mL 0    Sig: Inject 3 mg as directed once a week.     Endocrinology:  Diabetes -  GLP-1 Receptor Agonists Passed - 06/21/2022 11:57 AM      Passed - HBA1C is between 0 and 7.9 and within 180 days    Hemoglobin A1C  Date Value Ref Range Status  06/20/2022 7.4 (A) 4.0  - 5.6 % Final   HbA1c, POC (controlled diabetic range)  Date Value Ref Range Status  06/19/2019 7.4 (A) 0.0 - 7.0 % Final         Passed - Valid encounter within last 6 months    Recent Outpatient Visits           Yesterday Type 2 diabetes with nephropathy [facility])   [facility], Drue Stager, MD   4 months ago Type 2 diabetes with nephropathy [facility])   [facility] Steele Sizer, MD   10 months ago Essential (primary) hypertension   [facility] Teodora Medici, DO   1 year ago Diabetes mellitus type 2 in obese [facility])   [facility] Steele Sizer, MD   1 year ago Viral illness   [facility] Delsa Grana, PA-C       Future Appointments             In 3 months Steele Sizer, MD [facility], [facility]             levocetirizine (XYZAL) 5 MG tablet 90 tablet 0    Sig: Take 1 tablet (5 mg total) by mouth every evening. TAKE 1 TABLET BY MOUTH EVERY DAY IN THE EVENING     Ear, Nose, and Throat:  Antihistamines - levocetirizine dihydrochloride Passed - 06/21/2022 11:57 AM      Passed - Cr in normal range and within 360 days    Creat  Date Value Ref Range Status  02/11/2022 0.79 0.50 - 1.05 mg/dL Final   Creatinine, Urine  Date Value Ref Range Status  02/11/2022 63 20 - 275 mg/dL Final         Passed - eGFR is 10 or above and within 360 days    GFR, Est African American  Date Value Ref Range Status  03/04/2020 91 > OR = 60 mL/min/1.80m Final   GFR, Est Non African American  Date Value Ref Range Status  03/04/2020 78 > OR = 60 mL/min/1.7100mFinal   eGFR  Date Value Ref Range Status  02/11/2022 85 > OR = 60 mL/min/1.731minal         Passed - Valid encounter within last 12 months    Recent Outpatient Visits           Yesterday Type 2 diabetes with nephropathy ([facility] ConSouth PariswSteele SizerD   4 months ago Type 2 diabetes with nephropathy ([facility] ConGrenada [facility] CenterwSteele SizerD   10 months ago Essential (primary) hypertension   ConNew HavenO   1 year ago Diabetes mellitus type 2 in obese ([facility] ConNorth Vacherie Medical CenterwSteele SizerD   1 year ago Viral illness   ConKeosauqua Medical CenterpDelsa GranaA-C       Future Appointments             In 3 months SowSteele SizerD ConB[facility]             empagliflozin (JARDIANCE) 25 MG TABS tablet 90  tablet 1    Sig: Take 1 tablet (25 mg total) by mouth daily before breakfast. In place of synjardi     Endocrinology:  Diabetes - SGLT2 Inhibitors Passed - 06/21/2022 11:57 AM      Passed - Cr in normal range and within 360 days    Creat  Date Value Ref Range Status  02/11/2022 0.79 0.50 - 1.05 mg/dL Final   Creatinine, Urine  Date Value Ref Range Status  02/11/2022 63 20 - 275 mg/dL Final         Passed - HBA1C is between 0 and 7.9 and within 180 days    Hemoglobin A1C  Date Value Ref Range Status  06/20/2022 7.4 (A) 4.0 - 5.6 % Final   HbA1c, POC (controlled diabetic range)  Date Value Ref Range Status  06/19/2019 7.4 (A) 0.0 - 7.0 % Final         Passed - eGFR in normal range and within 360 days    GFR, Est African American  Date Value Ref Range Status  03/04/2020 91 > OR = 60 mL/min/1.41m Final   GFR, Est Non African American  Date Value Ref Range Status  03/04/2020 78 > OR = 60 mL/min/1.755mFinal   eGFR  Date Value Ref Range Status  02/11/2022 85 > OR = 60 mL/min/1.7362minal         Passed - Valid encounter within last 6 months    Recent Outpatient Visits           Yesterday Type 2 diabetes with nephropathy ([facility] ConSh[facility] CenterwSteele SizerD   4 months ago Type 2 diabetes with nephropathy ([facility]  ConEight[facility] Four Medical CenterwSteele SizerD   10 months ago Essential (primary) hypertension   ConLa MiradaO   1 year ago Diabetes mellitus type 2 in obese ([facility] ConFour Corn[facility] CenterwSteele SizerD   1 year ago Viral illness   ConV[facility] CenterpDelsa GranaA-[place]    Future Appointments             In 3 months SowSteele SizerD [facility]

## 2022-06-21 NOTE — Telephone Encounter (Signed)
Needs 3 month supply of each, for insurance affordability   Medication Refill - Medication: venlafaxine XR (EFFEXOR-XR) 75 MG 24 hr capsule  telmisartan-hydrochlorothiazide (MICARDIS HCT) 80-25 MG tablet  busPIRone (BUSPAR) 5 MG tablet  Dulaglutide (TRULICITY) 3 0000000 SOPN  empagliflozin (JARDIANCE) 25 MG TABS tablet  levocetirizine (XYZAL) 5 MG tablet  Has the patient contacted their pharmacy? Yes.   (Agent: If no, request that the patient contact the pharmacy for the refill. If patient does not wish to contact the pharmacy document the reason why and proceed with request.) (Agent: If yes, when and what did the pharmacy advise?)  Preferred Pharmacy (with phone number or street name):  [id number] [place] [place] [id number] Phone: [phone]: [phone] Hours: Not open 24 hours   Has the patient been seen for an appointment in the last year OR does the patient have an upcoming appointment? Yes.    Agent: Please be advised that RX refills may take up to 3 business days. We ask that you follow-up with your pharmacy.

## 2022-07-19 ENCOUNTER — Other Ambulatory Visit: Payer: Self-pay | Admitting: Family Medicine

## 2022-07-19 DIAGNOSIS — E669 Obesity, unspecified: Secondary | ICD-10-CM

## 2022-08-03 ENCOUNTER — Ambulatory Visit
Admission: RE | Admit: 2022-08-03 | Discharge: 2022-08-03 | Disposition: A | Discharge status: Home / Self Care | Payer: 59 | Source: Ambulatory Visit | Attending: Family Medicine | Admitting: Family Medicine

## 2022-08-03 DIAGNOSIS — Z1231 Encounter for screening mammogram for malignant neoplasm of breast: Secondary | ICD-10-CM

## 2022-08-08 ENCOUNTER — Telehealth: Payer: Self-pay | Admitting: Family Medicine

## 2022-08-08 ENCOUNTER — Other Ambulatory Visit: Payer: Self-pay

## 2022-08-08 DIAGNOSIS — R232 Flushing: Secondary | ICD-10-CM

## 2022-08-08 NOTE — Telephone Encounter (Signed)
Called patient to clarify. She stated she wants a GYN in general, but would like the referral for hot flashes. She is aware the bloating and cramps dx is referred to GI. She gave verbal understanding.

## 2022-08-08 NOTE — Telephone Encounter (Signed)
Copied from CRM [id number]. Topic: Referral - Request for Referral >> Aug 08, 2022  9:00 AM Franchot Heidelberg wrote: Has patient seen PCP for this complaint? Yes.   *If NO, is insurance requiring patient see PCP for this issue before PCP can refer them? Referral for which specialty: Gynecology  Preferred provider/office: [facility] in [place]  Reason for referral: Needs GYN  States she requested this during her last office visit in March

## 2022-08-19 ENCOUNTER — Other Ambulatory Visit: Payer: Self-pay | Admitting: Internal Medicine

## 2022-08-19 DIAGNOSIS — E1169 Type 2 diabetes mellitus with other specified complication: Secondary | ICD-10-CM

## 2022-08-19 NOTE — Telephone Encounter (Signed)
Requested Prescriptions  Pending Prescriptions Disp Refills   TRULICITY 3 MG/0.5ML SOPN [Pharmacy Med Name: TRULICITY 3 MG/0.5 ML PEN] 6 mL 0    Sig: INJECT 3 MG AS DIRECTED ONCE A WEEK.     Endocrinology:  Diabetes - GLP-1 Receptor Agonists Passed - 08/19/2022  9:16 AM      Passed - HBA1C is between 0 and 7.9 and within 180 days    Hemoglobin A1C  Date Value Ref Range Status  06/20/2022 7.4 (A) 4.0 - 5.6 % Final   HbA1c, POC (controlled diabetic range)  Date Value Ref Range Status  06/19/2019 7.4 (A) 0.0 - 7.0 % Final         Passed - Valid encounter within last 6 months    Recent Outpatient Visits           2 months ago Type 2 diabetes with neph[facility])   [facility] Alba Cory, MD   6 months ago Type 2 diabetes with nephropathy [facility])   [facility] Alba Cory, MD   1 year ago Essential (primary) hypertension   [facility] Margarita Mail, DO   1 year ago Diabetes mellitus type 2 in obese [facility])   [facility] Alba Cory, MD   1 year ago Viral illness   [facility] Danelle Berry, [place]       Future Appointments             In 1 month Alba Cory, MD [facility]

## 2022-08-22 ENCOUNTER — Telehealth: Payer: Self-pay | Admitting: Family Medicine

## 2022-08-22 NOTE — Telephone Encounter (Signed)
Medication Refill - Medication: Dulaglutide (TRULICITY) 3 MG/0.5ML SOPN   Has the patient contacted their pharmacy? Yes.   Medication is on back order. Patient is asking for samples Until her pharmacy has it back in stock  Preferred Pharmacy (with phone number or street name):  [id number] [place], [place] - [name] Phone: [phone]  Fax: [phone]     Has the patient been seen for an appointment in the last year OR does the patient have an upcoming appointment? No.  Agent: Please be advised that RX refills may take up to 3 business days. We ask that you follow-up with your pharmacy.

## 2022-08-22 NOTE — Telephone Encounter (Signed)
Called and informed patient that she will need to call around to different pharmacies to see who has her dosage in stock. I also informed her we do not have samples for her. Patient gave verbal understanding.

## 2022-08-29 ENCOUNTER — Telehealth: Payer: Self-pay | Admitting: Family Medicine

## 2022-08-29 NOTE — Telephone Encounter (Signed)
The patient called stating no pharmacies carry the Trulicity anymore as it is on backorder. She was told by her pharmacy to ask to see if her provider would write her a different prescription for something like Ozempic. [facility]  [id number] [place], [place] - [address] Phone: [phone]  Fax: [phone]     Please assist patient further as she has been off her medicine for over 2 weeks.

## 2022-08-30 ENCOUNTER — Other Ambulatory Visit: Payer: Self-pay | Admitting: Family Medicine

## 2022-08-30 MED ORDER — SEMAGLUTIDE (2 MG/DOSE) 8 MG/3ML ~~LOC~~ SOPN: 2.0000 mg | PEN_INJECTOR | SUBCUTANEOUS | 0 refills | Status: DC

## 2022-08-30 MED ORDER — TIRZEPATIDE 5 MG/0.5ML ~~LOC~~ SOAJ: 5.0000 mg | SUBCUTANEOUS | 0 refills | Status: DC

## 2022-09-02 ENCOUNTER — Ambulatory Visit: Payer: Self-pay | Admitting: *Deleted

## 2022-09-02 NOTE — Telephone Encounter (Signed)
Spoke with patient and relayed advice per Dr. Carlynn Purl. Patient verbalized understanding and will let us know if this does not help or if any other side effects present.

## 2022-09-02 NOTE — Telephone Encounter (Signed)
Message from Valora Piccolo sent at 09/02/2022  8:51 AM EDT  Summary: Vomiting Advice   Pt is calling to report that she started ozempic yesterday and was vomiting, and bad headache through out the night and today. Please advise          Call History   Type Contact Phone/Fax User  09/02/2022 08:50 AM EDT Phone (Incoming) Wendy, [name] (Self) [phone] Rexene Edison) Valora Piccolo

## 2022-09-02 NOTE — Telephone Encounter (Signed)
Reason for Disposition  Vomiting a prescription medication    Started Ozempic Thursday afternoon 09/01/2022.    Having vomiting and a bad headache ever since.  Answer Assessment - Initial Assessment Questions 1. VOMITING SEVERITY: "How many times have you vomited in the past 24 hours?"     - MILD:  1 - 2 times/day    - MODERATE: 3 - 5 times/day, decreased oral intake without significant weight loss or symptoms of dehydration    - SEVERE: 6 or more times/day, vomits everything or nearly everything, with significant weight loss, symptoms of dehydration      I returned her call.    I was started on Ozempic.   I have been vomiting and have a bad headache since starting it.    2. ONSET: "When did the vomiting begin?"      I took the first shot of Ozempic yesterday evening.      I took my Jardience yesterday morning but not this morning.   I also have a bad headache. 3. FLUIDS: "What fluids or food have you vomited up today?" "Have you been able to keep any fluids down?"     Can't keep anything down.   4. ABDOMEN PAIN: "Are your having any abdomen pain?" If Yes : "How bad is it and what does it feel like?" (e.g., crampy, dull, intermittent, constant)      No abd pain.     5. DIARRHEA: "Is there any diarrhea?" If Yes, ask: "How many times today?"      No diarrhea 6. CONTACTS: "Is there anyone else in the family with the same symptoms?"      N/A 7. CAUSE: "What do you think is causing your vomiting?"     Ozempic  8. HYDRATION STATUS: "Any signs of dehydration?" (e.g., dry mouth [not only dry lips], too weak to stand) "When did you last urinate?"     I can't keep anything down even water. 9. OTHER SYMPTOMS: "Do you have any other symptoms?" (e.g., fever, headache, vertigo, vomiting blood or coffee grounds, recent head injury)     I have a bad headache since starting the Ozempic too 10. PREGNANCY: "Is there any chance you are pregnant?" "When was your last menstrual period?"       N/A due to  age  Protocols used: Vomiting-A-AH  Chief Complaint: Vomiting and a bad headache since taking her first Ozempic injection Thurs. Evening (09/01/2022) Symptoms: Can't keep anything down even water.   I have a really bad headache too. Frequency: Since last night after taking her first Ozempic injection Pertinent Negatives: Patient denies diarrhea or abd pain. Disposition: [] ED /[] Urgent Care (no appt availability in office) / [] Appointment(In office/virtual)/ []  [place] [facility]/ [] Home Care/ [] Refused Recommended Disposition /[] [place] [facility]/ [x]  Follow-up with PCP Additional Notes: High priority message sent to Dr. Carlynn Purl.   Pt. Agreeable to someone calling her back with further directions regarding the Ozempic.   She did take her Jardience yesterday morning but not this morning.

## 2022-09-25 ENCOUNTER — Other Ambulatory Visit: Payer: Self-pay | Admitting: Family Medicine

## 2022-09-28 ENCOUNTER — Other Ambulatory Visit (INDEPENDENT_AMBULATORY_CARE_PROVIDER_SITE_OTHER): Payer: 59

## 2022-09-28 ENCOUNTER — Ambulatory Visit: Payer: 59 | Admitting: Gastroenterology

## 2022-09-28 ENCOUNTER — Encounter: Payer: Self-pay | Admitting: Gastroenterology

## 2022-09-28 ENCOUNTER — Ambulatory Visit: Payer: 59 | Admitting: Family Medicine

## 2022-09-28 VITALS — BP 126/74 | HR 86 | Ht 63.0 in | Wt 229.0 lb

## 2022-09-28 DIAGNOSIS — R1032 Left lower quadrant pain: Secondary | ICD-10-CM

## 2022-09-28 DIAGNOSIS — R1031 Right lower quadrant pain: Secondary | ICD-10-CM | POA: Diagnosis not present

## 2022-09-28 DIAGNOSIS — R140 Abdominal distension (gaseous): Secondary | ICD-10-CM

## 2022-09-28 LAB — COMPREHENSIVE METABOLIC PANEL
ALT: 14 U/L (ref 0–35)
AST: 15 U/L (ref 0–37)
Albumin: 4.1 g/dL (ref 3.5–5.2)
Alkaline Phosphatase: 78 U/L (ref 39–117)
BUN: 14 mg/dL (ref 6–23)
CO2: 31 mEq/L (ref 19–32)
Calcium: 9.4 mg/dL (ref 8.4–10.5)
Chloride: 103 mEq/L (ref 96–112)
Creatinine, Ser: 0.75 mg/dL (ref 0.40–1.20)
GFR: 85.37 mL/min (ref >60.00)
Glucose, Bld: 91 mg/dL (ref 70–99)
Potassium: 3.4 mEq/L — ABNORMAL LOW (ref 3.5–5.1)
Sodium: 142 mEq/L (ref 135–145)
Total Bilirubin: 0.4 mg/dL (ref 0.2–1.2)
Total Protein: 6.8 g/dL (ref 6.0–8.3)

## 2022-09-28 LAB — CBC WITH DIFFERENTIAL/PLATELET
Basophils Absolute: 0 10*3/uL (ref 0.0–0.1)
Basophils Relative: 0.5 % (ref 0.0–3.0)
Eosinophils Absolute: 0.3 10*3/uL (ref 0.0–0.7)
Eosinophils Relative: 3.5 % (ref 0.0–5.0)
HCT: 41 % (ref 36.0–46.0)
Hemoglobin: 13.3 g/dL (ref 12.0–15.0)
Lymphocytes Relative: 28.5 % (ref 12.0–46.0)
Lymphs Abs: 2.7 10*3/uL (ref 0.7–4.0)
MCHC: 32.5 g/dL (ref 30.0–36.0)
MCV: 88.8 fl (ref 78.0–100.0)
Monocytes Absolute: 0.6 10*3/uL (ref 0.1–1.0)
Monocytes Relative: 6.1 % (ref 3.0–12.0)
Neutro Abs: 5.7 10*3/uL (ref 1.4–7.7)
Neutrophils Relative %: 61.4 % (ref 43.0–77.0)
Platelets: 267 10*3/uL (ref 150.0–400.0)
RBC: 4.62 Mil/uL (ref 3.87–5.11)
RDW: 14.8 % (ref 11.5–15.5)
WBC: 9.4 10*3/uL (ref 4.0–10.5)

## 2022-09-28 LAB — TSH: TSH: 2.34 u[IU]/mL (ref 0.35–5.50)

## 2022-09-28 LAB — LIPASE: Lipase: 71 U/L — ABNORMAL HIGH (ref 11.0–59.0)

## 2022-09-28 NOTE — Patient Instructions (Addendum)
If your blood pressure at your visit was 140/90 or greater, please contact your primary care physician to follow up on this. ______________________________________________________  If you are age 62 or older, your body mass index should be between 23-30. Your Body mass index is 40.57 kg/m. If this is out of the aforementioned range listed, please consider follow up with your Primary Care Provider.  If you are age 95 or younger, your body mass index should be between 19-25. Your Body mass index is 40.57 kg/m. If this is out of the aformentioned range listed, please consider follow up with your Primary Care Provider.  ________________________________________________________  The [facility] providers would like to encourage you to use [facility] to communicate with providers for non-urgent requests or questions.  Due to long hold times on the telephone, sending your provider a message by [facility] may be a faster and more efficient way to get a response.  Please allow 48 business hours for a response.  Please remember that this is for non-urgent requests.  _______________________________________________________  Due to recent changes in healthcare laws, you may see the results of your imaging and laboratory studies on MyChart before your provider has had a chance to review them.  We understand that in some cases there may be results that are confusing or concerning to you. Not all laboratory results come back in the same time frame and the provider may be waiting for multiple results in order to interpret others.  Please give [place] 48 hours in order for your provider to thoroughly review all the results before contacting the office for clarification of your results.    You have been scheduled for a CT scan of the abdomen and pelvis at [facility], 1st floor Radiology. You are scheduled on Wednesday, 10-12-22 at 9:00am. You should arrive 8:45am minutes prior to your appointment time for registration.     Do not eat anything after 5:00am (4 hours prior to your test).  It is ok to drink water.  Plan on being there 30 to 60 minutes, depending on the type of exam you are having performed.   If you have any questions regarding your exam or if you need to reschedule, you may call [facility] Radiology Scheduling at [phone] between the hours of 8:00 am and 5:00 pm, Monday-Friday.    Please go to the lab in the basement of our building to have lab work done as you leave today. Hit "B" for basement when you get on the elevator.  When the doors open the lab is on your left.  We will call you with the results. Thank you.   You have been referred by Dr. Carlynn Purl to [facility]GYN[facility].  They will reach out to you to schedule you for an appointment.  If you have not heard from them within a week please call Dr. Carlynn Purl' office so they can follow up on their referral.  Thank you for choosing me and [facility].  Venita Lick. Pleas Koch., MD., Clementeen Graham

## 2022-09-28 NOTE — Progress Notes (Signed)
Assessment    Intermittent lower abdominal pain/cramping associated with vaginal bleeding - suspect GYN disorder S/P segmental small bowel resection due to obstruction, perforation, intussusception in 2020 CRC screening, average risk    Recommendations   CBC, CMP, TSH, lipase Schedule CT AP Proceed with GYN referral as planned Screening colonoscopy May 2028   HPI   Chief complaint: abdominal bloating, cramping  Patient profile:  Wendy [name] is a 62 y.o. female referred by Alba Cory, MD for  abdominal bloating, cramping.  Colonoscopy performed in 2018 was normal as below.  She has a history of a small bowel obstruction, pneumoperitoneum, intussusception with necrosis and perforation and 2020 - S/P exploratory laparotomy with small bowel resection, anastomosis     Previous Labs / Imaging::    Latest Ref Rng & Units 02/11/2022    3:39 PM 03/10/2021    3:22 PM 03/04/2020   12:00 AM  CBC  WBC 3.8 - 10.8 Thousand/uL 8.4  9.0  8.8   Hemoglobin 11.7 - 15.5 g/dL 16.1  09.6  04.5   Hematocrit 35.0 - 45.0 % 41.9  41.6  43.5   Platelets 140 - 400 Thousand/uL 272  373  269     Lab Results  Component Value Date   LIPASE 37 05/14/2018      Latest Ref Rng & Units 02/11/2022    3:39 PM 03/10/2021    3:22 PM 03/04/2020   12:00 AM  CMP  Glucose 65 - 99 mg/dL 87  98  409   BUN 7 - 25 mg/dL 9  9  16    Creatinine 0.50 - 1.05 mg/dL 8.11  9.14  7.82   Sodium 135 - 146 mmol/L 143  143  142   Potassium 3.5 - 5.3 mmol/L 3.4  4.2  3.9   Chloride 98 - 110 mmol/L 105  103  103   CO2 20 - 32 mmol/L 30  30  30    Calcium 8.6 - 10.4 mg/dL 9.5  9.8  9.7   Total Protein 6.1 - 8.1 g/dL 6.5  6.6  6.6   Total Bilirubin 0.2 - 1.2 mg/dL 0.5  0.3  0.4   AST 10 - 35 U/L 13  15  18    ALT 6 - 29 U/L 11  17  19       Previous GI evaluation    Endoscopies:  Colonoscopy May 2018 - One 6 mm polyp in the sigmoid colon, removed with a cold snare. Resected and retrieved.  - The  examination was otherwise normal on direct and retroflexion views. Path: benign mucosa  Imaging:     Past Medical History:  Diagnosis Date   Acute eczema    Allergy    Anemia    Anxiety    Chronic constipation with overflow    Diabetes mellitus without complication (HCC)    GERD (gastroesophageal reflux disease)    Herpetic vesicle in vagina    Hyperlipidemia    Hypertension    Leg edema    Morbid obesity (HCC)    Peri-menopause    SBO (small bowel obstruction) (HCC) 05/2018   Snoring    Past Surgical History:  Procedure Laterality Date   APPLICATION OF WOUND VAC  04/2018   CESAREAN SECTION  1993   LAPAROTOMY N/A 05/14/2018   Procedure: EXPLORATORY LAPAROTOMY;  Surgeon: Violeta Gelinas, MD;  Location: [facility];  Service: General;  Laterality: N/A;   LAPAROTOMY N/A 05/16/2018   Procedure: EXPLORATORY LAPAROTOMY with reanatomosis and fascia closure  with wound vacum application;  Surgeon: Harriette Bouillon, MD;  Location: [facility] OR;  Service: General;  Laterality: N/A;   tonsillectomy and adnoidectomy     WISDOM TOOTH EXTRACTION     Family History  Problem Relation Age of Onset   Diabetes Mother    Asthma Mother    Heart disease Mother    Heart disease Father    Prostate cancer Father    Other Father        blockages in stomach   Kidney disease Paternal Grandmother    Asthma Son    Autism Son    Breast cancer Neg Hx    Colon cancer Neg Hx    Esophageal cancer Neg Hx    Pancreatic cancer Neg Hx    Rectal cancer Neg Hx    Stomach cancer Neg Hx    Social History   Tobacco Use   Smoking status: Former    Years: 20    Types: Cigarettes    Start date: 04/19/1971    Quit date: 04/2018    Years since quitting: 4.4   Smokeless tobacco: Never   Tobacco comments:    she quit and started back October 2019, but quit again 04/2018  Vaping Use   Vaping Use: Never used  Substance Use Topics   Alcohol use: No    Alcohol/week: 0.0 standard drinks of alcohol   Drug use: No    Current Outpatient Medications  Medication Sig Dispense Refill   acetaminophen (TYLENOL) 325 MG tablet Take 2 tablets (650 mg total) by mouth every 6 (six) hours as needed for mild pain or headache. 25 tablet 0   blood glucose meter kit and supplies Dispense based on patient and insurance preference. Use up to four times daily as directed. (FOR ICD-10 E10.9, E11.9). 1 each 2   Blood Glucose Monitoring Suppl (ONE TOUCH ULTRA 2) w/Device KIT 1 kit by Subdermal route See admin instructions. 1 each 0   busPIRone (BUSPAR) 5 MG tablet Take 1 tablet (5 mg total) by mouth 2 (two) times daily. 180 tablet 0   Cholecalciferol (VITAMIN D) 2000 units CAPS Take 1 capsule (2,000 Units total) by mouth daily. 30 capsule 0   empagliflozin (JARDIANCE) 25 MG TABS tablet Take 1 tablet (25 mg total) by mouth daily before breakfast. In place of synjardi 90 tablet 1   glucose blood (ONE TOUCH ULTRA TEST) test strip CHECK FASTING BLOOD SUGARS TWICE DAILY 100 each 2   levocetirizine (XYZAL) 5 MG tablet Take 1 tablet (5 mg total) by mouth every evening. TAKE 1 TABLET BY MOUTH EVERY DAY IN THE EVENING 90 tablet 0   ONETOUCH DELICA LANCETS FINE MISC See admin instructions.  0   rosuvastatin (CRESTOR) 40 MG tablet Take 1 tablet (40 mg total) by mouth daily. In place of Atorvastatin 90 tablet 1   Semaglutide, 2 MG/DOSE, (OZEMPIC, 2 MG/DOSE,) 8 MG/3ML SOPN INJECT 2 MG AS DIRECTED ONCE A WEEK. 6 mL 0   telmisartan-hydrochlorothiazide (MICARDIS HCT) 80-25 MG tablet Take 1 tablet by mouth daily. 90 tablet 1   valACYclovir (VALTREX) 500 MG tablet TAKE 1 TABLET BY MOUTH 3 TIMES A DAY AS NEEDED FOR PREVENTION.AND DAILY FOR PREVENTION 300 tablet 1   venlafaxine XR (EFFEXOR-XR) 75 MG 24 hr capsule TAKE 1 CAPSULE BY MOUTH DAILY WITH BREAKFAST. 90 capsule 1   Vitamin D, Ergocalciferol, (DRISDOL) 1.25 MG (50000 UNIT) CAPS capsule Take 1 capsule (50,000 Units total) by mouth every 7 (seven) days. 12 capsule 1  No current  facility-administered medications for this visit.   Allergies  Allergen Reactions   Penicillins Swelling    Did it involve swelling of the face/tongue/throat, SOB, or low BP? Yes Did it involve sudden or severe rash/hives, skin peeling, or any reaction on the inside of your mouth or nose? No Did you need to seek medical attention at a hospital or doctor's office? Yes When did it last happen? unknown If all above answers are "NO", may proceed with cephalosporin use.     Review of Systems: All other systems reviewed and negative except where noted in HPI.    Physical Exam    Wt Readings from Last 3 Encounters:  06/20/22 232 lb 3.2 oz (105.3 kg)  02/11/22 211 lb 9.6 oz (96 kg)  08/13/21 216 lb (98 kg)    LMP 01/26/2017 (Approximate)  Constitutional:  Generally well appearing female in no acute distress. HEENT: Pupils normal.  Conjunctivae are normal. No scleral icterus. No oral lesions or deformities noted.  Neck: Supple.  Cardiac: Normal rate, regular rhythm without murmurs. Pulmonary/chest: Effort normal and breath sounds normal. No wheezing, rales or rhonchi. Abdominal: Soft, nondistended, nontender. Active bowel sounds. No palpable HSM, masses or hernias. Rectal: Not done Extremities: No edema or deformities noted Neurological: Alert and oriented to person, place and time. Psychiatric: Pleasant. Normal mood and affect. Behavior is normal. Skin: Skin is warm and dry. No rashes noted.  Claudette Head, MD   cc:  Referring Provider Alba Cory, MD

## 2022-09-29 ENCOUNTER — Other Ambulatory Visit: Payer: Self-pay | Admitting: Family Medicine

## 2022-09-29 ENCOUNTER — Telehealth: Payer: Self-pay

## 2022-09-29 DIAGNOSIS — E876 Hypokalemia: Secondary | ICD-10-CM

## 2022-09-29 DIAGNOSIS — E133299 Other specified diabetes mellitus with mild nonproliferative diabetic retinopathy without macular edema, unspecified eye: Secondary | ICD-10-CM

## 2022-09-29 NOTE — Telephone Encounter (Signed)
Left message on machine to call back   Repeat lab order has been entered

## 2022-09-29 NOTE — Telephone Encounter (Signed)
-----   Message from Meryl Dare, MD sent at 09/28/2022  5:54 PM EDT ----- Regarding: FW: Slightly low K+ Mildly increased lipase - not likely clinically significant  Otherwise normal results   Increase high K+ food intake  BMP, repeat lipase in 1-2 weeks ----- Message ----- From: Interface, Lab In Three Zero One Sent: 09/28/2022   4:13 PM EDT To: Meryl Dare, MD

## 2022-09-29 NOTE — Telephone Encounter (Signed)
Medication Refill - Medication: blood glucose meter kit and supplies   Has the patient contacted their pharmacy? yes (Agent: If no, request that the patient contact the pharmacy for the refill. If patient does not wish to contact the pharmacy document the reason why and proceed with request.) (Agent: If yes, when and what did the pharmacy advise?)contact pcp  Preferred Pharmacy (with phone number or street name):  [id number] [place], [place] - [address] Phone: [phone]  Fax: [phone]     Has the patient been seen for an appointment in the last year OR does the patient have an upcoming appointment? yes  Agent: Please be advised that RX refills may take up to 3 business days. We ask that you follow-up with your pharmacy.

## 2022-09-29 NOTE — Telephone Encounter (Signed)
Requested medications are due for refill today.  Unsure  Requested medications are on the active medications list.  yes  Last refill. 01/15/2019 1 kit 2 refills  Future visit scheduled.   yes  Notes to clinic.  Please review for refill.    Requested Prescriptions  Pending Prescriptions Disp Refills   blood glucose meter kit and supplies 1 each 2    Sig: Dispense based on patient and insurance preference. Use up to four times daily as directed. (FOR ICD-10 E10.9, E11.9).     Endocrinology: Diabetes - Testing Supplies Passed - 09/29/2022 12:41 PM      Passed - Valid encounter within last 12 months    Recent Outpatient Visits           3 months ago Type 2 diabetes with nephropathy [facility])   [place] [facility] Alba Cory, MD   7 months ago Type 2 diabetes with nephropathy [facility])   [place] [facility] Alba Cory, MD   1 year ago Essential (primary) hypertension   [place] [facility] Margarita Mail, DO   1 year ago Diabetes mellitus type 2 in obese [facility])   [facility] Alba Cory, MD   1 year ago Viral illness   [facility] Danelle Berry, PA-C       Future Appointments             In 4 days Alba Cory, MD [place] [facility], [facility]

## 2022-09-29 NOTE — Telephone Encounter (Signed)
Wendy Dare, MD  Loretha Stapler, RN Slightly low K+ Mildly increased lipase - not likely clinically significant Otherwise normal results  Increase high K+ food intake BMP, repeat lipase in 1-2 weeks

## 2022-09-30 MED ORDER — BLOOD GLUCOSE METER KIT
PACK | 2 refills | Status: DC
Start: 2022-09-30 — End: 2023-07-12

## 2022-09-30 NOTE — Progress Notes (Unsigned)
Name: Wendy [name]   MRN: [id number]    DOB: 1960-10-08   Date:10/03/2022       Progress Note  Subjective  Chief Complaint  Follow Up  HPI  DM with nephropathy, retinopathy:  she is currently using half dose of Ozempic 2 mg , she has hiccups , not taking Jardiance. Advised to go down to Ozempic 1 mg and go back on Jardiance. A1C is 7.2 % still not at goal.  She denies polyphagia, polydipsia or polyuria. She has retinopathy and reminded her to keep up with eye exam. She also has dyslipidemia, HTN, obesity.   HTN: today she has been compliant with Telmisartan. HCTZ, BP is at goal, denies chest pain, palpitation or sob . Continue medication. BP is at goal    Hyperlipidemia: she is back on Atorvastatin and last LDL was back to normal at 61. Continue medication   Morbid obesity: eating smaller portions, she is now on Ozempic and also needs to resume Jardiance    Hot Flashes/Depression and GAD: she has been on Effexor,she states it helps with hot flashes, she can only tolerate effexor one in am and one at lunch , but not 150 mg  because it makes her feel sick . Unchanged   Abdominal cramping weekly, bloating daily and is due for colonoscopy , Dr. Russella Dar ordered a CT abdomen and pelvis that is due 10/12/2022 , she is telling me today that she also sees blood in her urine , going on for the past year, we will check urinalysis and if positive wait for CT and consider referral urologist, if negative must return for pelvic exam . She also wants to see gyn, referral already placed for hot flashes   Patient Active Problem List   Diagnosis Date Noted   GAD (generalized anxiety disorder) 08/13/2021   Endometrial polyp 11/29/2017   Fibroid 11/29/2017   Post-menopausal bleeding 11/14/2017   Mild nonproliferative diabetic retinopathy of both eyes associated with type 2 diabetes mellitus (HCC) 06/09/2016   Diabetes mellitus type 2 in obese 03/01/2016   Vitamin D deficiency 07/07/2015   Vitamin B12 deficiency  07/07/2015   Snoring 12/25/2014   Allergic rhinitis, seasonal 12/14/2014   Chronic constipation 12/14/2014   Diabetes mellitus with renal manifestation (HCC) 12/14/2014   Dyslipidemia 12/14/2014   Dermatitis, eczematoid 12/14/2014   Edema extremities 12/14/2014   Essential (primary) hypertension 12/14/2014   Gastro-esophageal reflux disease without esophagitis 12/14/2014   Herpes simplex vulvovaginitis 12/14/2014   Morbid obesity (HCC) 12/14/2014   Female climacteric state 12/14/2014    Past Surgical History:  Procedure Laterality Date   APPLICATION OF WOUND VAC  04/2018   CESAREAN SECTION  1993   LAPAROTOMY N/A 05/14/2018   Procedure: EXPLORATORY LAPAROTOMY;  Surgeon: Violeta Gelinas, MD;  Location[facility] OR;  Service: General;  Laterality: N/A;   LAPAROTOMY N/A 05/16/2018   Procedure: EXPLORATORY LAPAROTOMY with reanatomosis and fascia closure with wound vacum application;  Surgeon: Harriette Bouillon, MD;  Location: [facility] OR;  Service: General;  Laterality: N/A;   tonsillectomy and adnoidectomy     WISDOM TOOTH EXTRACTION      Family History  Problem Relation Age of Onset   Diabetes Mother    Asthma Mother    Heart disease Mother    Heart disease Father    Prostate cancer Father    Other Father        blockages in stomach   Kidney disease Paternal Grandmother    Asthma Son    Autism Son  Breast cancer Neg Hx    Colon cancer Neg Hx    Esophageal cancer Neg Hx    Pancreatic cancer Neg Hx    Rectal cancer Neg Hx    Stomach cancer Neg Hx     Social History   Tobacco Use   Smoking status: Former    Years: 20    Types: Cigarettes    Start date: 04/19/1971    Quit date: 04/2018    Years since quitting: 4.4   Smokeless tobacco: Never   Tobacco comments:    she quit and started back October 2019, but quit again 04/2018  Substance Use Topics   Alcohol use: No    Alcohol/week: 0.0 standard drinks of alcohol     Current Outpatient Medications:    acetaminophen (TYLENOL)  325 MG tablet, Take 2 tablets (650 mg total) by mouth every 6 (six) hours as needed for mild pain or headache., Disp: 25 tablet, Rfl: 0   blood glucose meter kit and supplies, Dispense based on patient and insurance preference. Use up to four times daily as directed. (FOR ICD-10 E10.9, E11.9)., Disp: 1 each, Rfl: 2   Blood Glucose Monitoring Suppl (ONE TOUCH ULTRA 2) w/Device KIT, 1 kit by Subdermal route See admin instructions., Disp: 1 each, Rfl: 0   glucose blood (ONE TOUCH ULTRA TEST) test strip, CHECK FASTING BLOOD SUGARS TWICE DAILY, Disp: 100 each, Rfl: 2   levocetirizine (XYZAL) 5 MG tablet, Take 1 tablet (5 mg total) by mouth every evening. TAKE 1 TABLET BY MOUTH EVERY DAY IN THE EVENING, Disp: 90 tablet, Rfl: 0   ONETOUCH DELICA LANCETS FINE MISC, See admin instructions., Disp: , Rfl: 0   Semaglutide, 1 MG/DOSE, 4 MG/3ML SOPN, Inject 1 mg as directed once a week., Disp: 9 mL, Rfl: 0   busPIRone (BUSPAR) 5 MG tablet, Take 1 tablet (5 mg total) by mouth 2 (two) times daily., Disp: 180 tablet, Rfl: 0   empagliflozin (JARDIANCE) 25 MG TABS tablet, Take 1 tablet (25 mg total) by mouth daily before breakfast. In place of synjardi (Patient not taking: Reported on 10/03/2022), Disp: 90 tablet, Rfl: 1   rosuvastatin (CRESTOR) 40 MG tablet, Take 1 tablet (40 mg total) by mouth daily. In place of Atorvastatin, Disp: 90 tablet, Rfl: 1   telmisartan-hydrochlorothiazide (MICARDIS HCT) 80-25 MG tablet, Take 1 tablet by mouth daily., Disp: 90 tablet, Rfl: 1   valACYclovir (VALTREX) 500 MG tablet, Take 1 tablet (500 mg total) by mouth daily., Disp: 100 tablet, Rfl: 1   venlafaxine XR (EFFEXOR-XR) 75 MG 24 hr capsule, Take 1 capsule (75 mg total) by mouth daily with breakfast., Disp: 90 capsule, Rfl: 1   Vitamin D, Ergocalciferol, (DRISDOL) 1.25 MG (50000 UNIT) CAPS capsule, Take 1 capsule (50,000 Units total) by mouth every 7 (seven) days., Disp: 12 capsule, Rfl: 1  Allergies  Allergen Reactions    Penicillins Swelling    Did it involve swelling of the face/tongue/throat, SOB, or low BP? Yes Did it involve sudden or severe rash/hives, skin peeling, or any reaction on the inside of your mouth or nose? No Did you need to seek medical attention at a hospital or doctor's office? Yes When did it last happen? unknown If all above answers are "NO", may proceed with cephalosporin use.     I personally reviewed active problem list, medication list, allergies, family history, social history, health maintenance with the patient/caregiver today.   ROS  Constitutional: Negative for fever , positive for weight change.  Respiratory: Negative for cough and shortness of breath.   Cardiovascular: Negative for chest pain or palpitations.  Gastrointestinal: positive  for abdominal pain, no bowel changes.  Musculoskeletal: Negative for gait problem or joint swelling.  Skin: Negative for rash.  Neurological: Negative for dizziness or headache.  No other specific complaints in a complete review of systems (except as listed in HPI above).   Objective  Vitals:   10/03/22 1407  BP: 122/70  Pulse: 96  Resp: 18  Temp: 97.9 F (36.6 C)  TempSrc: Oral  SpO2: 98%  Weight: 225 lb 11.2 oz (102.4 kg)  Height: 5\' 3"  (1.6 m)    Body mass index is 39.98 kg/m.  Physical Exam  Constitutional: Patient appears well-developed and well-nourished. Obese  No distress.  HEENT: head atraumatic, normocephalic, pupils equal and reactive to light,, neck supple Cardiovascular: Normal rate, regular rhythm and normal heart sounds.  No murmur heard. No BLE edema. Pulmonary/Chest: Effort normal and breath sounds normal. No respiratory distress. Abdominal: Soft.  There is no tenderness. Psychiatric: Patient has a normal mood and affect. behavior is normal. Judgment and thought content normal.    PHQ2/9:    06/20/2022    3:00 PM 02/11/2022    2:28 PM 08/13/2021    1:56 PM 03/10/2021    2:39 PM 01/27/2021    2:07  PM  Depression screen PHQ 2/9  Decreased Interest 3 3 0 0 0  Down, Depressed, Hopeless 3 0 0 1 0  PHQ - 2 Score 6 3 0 1 0  Altered sleeping 3 0 0 3 0  Tired, decreased energy 3 0 0 3 0  Change in appetite 1 0 0 0 0  Feeling bad or failure about yourself  0 0 0 0 0  Trouble concentrating 0 0 0 0 0  Moving slowly or fidgety/restless 0 0 0 1 0  Suicidal thoughts 0 0 0 0 0  PHQ-9 Score 13 3 0 8 0  Difficult doing work/chores Very difficult Not difficult at all Not difficult at all Not difficult at all Not difficult at all    phq 9 is positive   Fall Risk:    10/03/2022    2:09 PM 06/20/2022    3:00 PM 02/11/2022    2:28 PM 08/13/2021    1:55 PM 03/10/2021    2:39 PM  Fall Risk   Falls in the past year? 0 0 0 0 0  Number falls in past yr:   0 0   Injury with Fall?   0 0   Risk for fall due to : No Fall Risks No Fall Risks No Fall Risks    Follow up Falls prevention discussed Falls prevention discussed;Education provided;Falls evaluation completed Education provided;Falls evaluation completed;Falls prevention discussed  Falls prevention discussed      Functional Status Survey: Is the patient deaf or have difficulty hearing?: No Does the patient have difficulty seeing, even when wearing glasses/contacts?: No Does the patient have difficulty concentrating, remembering, or making decisions?: No Does the patient have difficulty walking or climbing stairs?: No Does the patient have difficulty dressing or bathing?: No Does the patient have difficulty doing errands alone such as visiting a doctor's office or shopping?: No    Assessment & Plan  1. Type 2 diabetes with nephropathy (HCC)  - POCT HgB A1C - Semaglutide, 1 MG/DOSE, 4 MG/3ML SOPN; Inject 1 mg as directed once a week.  Dispense: 9 mL; Refill: 0  2. GAD (generalized anxiety disorder)  -  busPIRone (BUSPAR) 5 MG tablet; Take 1 tablet (5 mg total) by mouth 2 (two) times daily.  Dispense: 180 tablet; Refill: 0 - venlafaxine  XR (EFFEXOR-XR) 75 MG 24 hr capsule; Take 1 capsule (75 mg total) by mouth daily with breakfast.  Dispense: 90 capsule; Refill: 1  3. Dyslipidemia  - rosuvastatin (CRESTOR) 40 MG tablet; Take 1 tablet (40 mg total) by mouth daily. In place of Atorvastatin  Dispense: 90 tablet; Refill: 1  4. Essential (primary) hypertension  - telmisartan-hydrochlorothiazide (MICARDIS HCT) 80-25 MG tablet; Take 1 tablet by mouth daily.  Dispense: 90 tablet; Refill: 1  5. Vitamin D deficiency  - Vitamin D, Ergocalciferol, (DRISDOL) 1.25 MG (50000 UNIT) CAPS capsule; Take 1 capsule (50,000 Units total) by mouth every 7 (seven) days.  Dispense: 12 capsule; Refill: 1  6. Herpes simplex vulvovaginitis  - valACYclovir (VALTREX) 500 MG tablet; Take 1 tablet (500 mg total) by mouth daily.  Dispense: 100 tablet; Refill: 1  7. Vitamin B12 deficiency   8. Morbid obesity (HCC)  Discussed with the patient the risk posed by an increased BMI. Discussed importance of portion control, calorie counting and at least 150 minutes of physical activity weekly. Avoid sweet beverages and drink more water. Eat at least 6 servings of fruit and vegetables daily    9. Gross hematuria  - Urinalysis, Complete

## 2022-09-30 NOTE — Telephone Encounter (Signed)
Left message on machine to call back  

## 2022-10-03 ENCOUNTER — Ambulatory Visit: Payer: 59 | Admitting: Family Medicine

## 2022-10-03 ENCOUNTER — Encounter: Payer: Self-pay | Admitting: Family Medicine

## 2022-10-03 VITALS — BP 122/70 | HR 96 | Temp 97.9°F | Resp 18 | Ht 63.0 in | Wt 225.7 lb

## 2022-10-03 DIAGNOSIS — E785 Hyperlipidemia, unspecified: Secondary | ICD-10-CM | POA: Diagnosis not present

## 2022-10-03 DIAGNOSIS — E1121 Type 2 diabetes mellitus with diabetic nephropathy: Secondary | ICD-10-CM | POA: Diagnosis not present

## 2022-10-03 DIAGNOSIS — R310 Gross hematuria: Secondary | ICD-10-CM

## 2022-10-03 DIAGNOSIS — I10 Essential (primary) hypertension: Secondary | ICD-10-CM

## 2022-10-03 DIAGNOSIS — Z7985 Long-term (current) use of injectable non-insulin antidiabetic drugs: Secondary | ICD-10-CM

## 2022-10-03 DIAGNOSIS — E559 Vitamin D deficiency, unspecified: Secondary | ICD-10-CM

## 2022-10-03 DIAGNOSIS — F411 Generalized anxiety disorder: Secondary | ICD-10-CM

## 2022-10-03 DIAGNOSIS — A6004 Herpesviral vulvovaginitis: Secondary | ICD-10-CM

## 2022-10-03 DIAGNOSIS — E538 Deficiency of other specified B group vitamins: Secondary | ICD-10-CM

## 2022-10-03 LAB — POCT GLYCOSYLATED HEMOGLOBIN (HGB A1C): Hemoglobin A1C: 7.3 % — AB (ref 4.0–5.6)

## 2022-10-03 MED ORDER — SEMAGLUTIDE (1 MG/DOSE) 4 MG/3ML ~~LOC~~ SOPN
1.0000 mg | PEN_INJECTOR | SUBCUTANEOUS | 0 refills | Status: DC
Start: 2022-10-03 — End: 2023-01-04

## 2022-10-03 MED ORDER — VITAMIN D (ERGOCALCIFEROL) 1.25 MG (50000 UNIT) PO CAPS: 50000.0000 [IU] | ORAL_CAPSULE | ORAL | 1 refills | Status: DC

## 2022-10-03 MED ORDER — VENLAFAXINE HCL ER 75 MG PO CP24: 75.0000 mg | ORAL_CAPSULE | Freq: Every day | ORAL | 1 refills | Status: DC

## 2022-10-03 MED ORDER — ROSUVASTATIN CALCIUM 40 MG PO TABS: 40.0000 mg | ORAL_TABLET | Freq: Every day | ORAL | 1 refills | Status: DC

## 2022-10-03 MED ORDER — VALACYCLOVIR HCL 500 MG PO TABS
500.0000 mg | ORAL_TABLET | Freq: Every day | ORAL | 1 refills | Status: AC
Start: 2022-10-03 — End: ?

## 2022-10-03 MED ORDER — BUSPIRONE HCL 5 MG PO TABS: 5.0000 mg | ORAL_TABLET | Freq: Two times a day (BID) | ORAL | 0 refills | Status: DC

## 2022-10-03 MED ORDER — TELMISARTAN-HCTZ 80-25 MG PO TABS: 1.0000 | ORAL_TABLET | Freq: Every day | ORAL | 1 refills | Status: DC

## 2022-10-03 NOTE — Patient Instructions (Addendum)
[facility] for [facility] Ph: [phone] [address] Floor [place], [place] [address]

## 2022-10-03 NOTE — Telephone Encounter (Signed)
The pt has been advised of the results. She will come in 2 weeks for repeat labs after eating high potassium foods.

## 2022-10-04 LAB — URINALYSIS, COMPLETE
Bilirubin Urine: NEGATIVE
Glucose, UA: NEGATIVE
Hgb urine dipstick: NEGATIVE
Hyaline Cast: NONE SEEN /LPF
Ketones, ur: NEGATIVE
Leukocytes,Ua: NEGATIVE
Nitrite: NEGATIVE
Protein, ur: NEGATIVE
RBC / HPF: NONE SEEN /HPF (ref 0–2)
Specific Gravity, Urine: 1.011 (ref 1.001–1.035)
Squamous Epithelial / HPF: NONE SEEN /HPF (ref <=5)
pH: 7 (ref 5.0–8.0)

## 2022-10-05 ENCOUNTER — Ambulatory Visit: Payer: Self-pay | Admitting: *Deleted

## 2022-10-05 ENCOUNTER — Other Ambulatory Visit: Payer: Self-pay | Admitting: Family Medicine

## 2022-10-05 NOTE — Telephone Encounter (Signed)
Medication Refill - Medication: blood glucose meter kit and supplies  Shows requested on June 13 but the pharmacy doesn't have it  Has the patient contacted their pharmacy? yes (Agent: If no, request that the patient contact the pharmacy for the refill. If patient does not wish to contact the pharmacy document the reason why and proceed with request.) (Agent: If yes, when and what did the pharmacy advise?)  Preferred Pharmacy (with phone number or street name): [id number] [place], [place] - [address] Phone: [phone]  Fax: [phone]     Has the patient been seen for an appointment in the last year OR does the patient have an upcoming appointment? yes  Agent: Please be advised that RX refills may take up to 3 business days. We ask that you follow-up with your pharmacy.

## 2022-10-05 NOTE — Telephone Encounter (Signed)
:   med reaction   Pt called says was told by pharmacist, she isnt supposed ot take Jaudiance with Ozempic. She said it made her sick when she took these together 2 weeks ago so she stopped taking these.       Chief Complaint: Medication Symptoms: Nausea and vomiting, chills, sweating, 2 weeks ago after taking Ozempic and Jardiance together. Frequency: Occurred 2 weeks ago Pertinent Negatives: Patient denies  Disposition: [] ED /[] Urgent Care (no appt availability in office) / [] Appointment(In office/virtual)/ []  [place] [facility]/ [] Home Care/ [] Refused Recommended Disposition /[] [place] [facility]/ [x]  Follow-up with PCP Additional Notes: Pt states 2 weeks ago she took [place] and Ozempic together as ordered. States had nausea and vomiting for 1 week after, states pharmacist said "You should never take those together." States she has since taken 1ml of Ozempic, no Jardiance. No symptoms. States she does not check her BS as she is out of strips and meter. Noted meter and supplies were ordered 09/30/22 as PRINT, not received by pharmacy. [facility] [address]  Pt is also requesting a call back frm Dr. Carlynn Purl "About all of this." Assured pt NT would route to practice for PCPs review.   Reason for Disposition  [1] Caller has NON-URGENT medicine question about med that PCP prescribed AND [2] triager unable to answer question  Answer Assessment - Initial Assessment Questions 1. NAME of MEDICINE: "What medicine(s) are you calling about?"     Ozempic and Jardiance 2. QUESTION: "What is your question?" (e.g., double dose of medicine, side effect)     Told not to take together 3. PRESCRIBER: "Who prescribed the medicine?" Reason: if prescribed by specialist, call should be referred to that group.     PCP 4. SYMPTOMS: "Do you have any symptoms?" If Yes, ask: "What symptoms are you having?"  "How bad are the symptoms (e.g., mild, moderate, severe)     Nausea and  vomiting.  Protocols used: Medication Question Call-A-AH

## 2022-10-06 MED ORDER — ONETOUCH ULTRA 2 W/DEVICE KIT: 1.0000 | PACK | 0 refills | Status: DC

## 2022-10-06 NOTE — Telephone Encounter (Signed)
Called w/no answer. Left a detailed vm.

## 2022-10-06 NOTE — Telephone Encounter (Signed)
Requested medication (s) are due for refill today: routing for review  Requested medication (s) are on the active medication list: yes  Last refill:  06/11/18  Future visit scheduled: yes  Notes to clinic:  Unable to refill per protocol, may need a new prescription, routing for approval.      Requested Prescriptions  Pending Prescriptions Disp Refills   Blood Glucose Monitoring Suppl (ONE TOUCH ULTRA 2) w/Device KIT 1 kit 0    Sig: 1 kit by Subdermal route See admin instructions.     Endocrinology: Diabetes - Testing Supplies Passed - 10/05/2022  5:37 PM      Passed - Valid encounter within last 12 months    Recent Outpatient Visits           3 days ago Type 2 diabetes with nephropathy [facility])   [place] [facility] Alba Cory, MD   3 months ago Type 2 diabetes with nephropathy [facility])   [place] [facility] Alba Cory, MD   7 months ago Type 2 diabetes with nephropathy [facility])   [place] [facility] Alba Cory, MD   1 year ago Essential (primary) hypertension    [facility] Margarita Mail, DO   1 year ago Diabetes mellitus type 2 in obese [facility])   [facility] Alba Cory, MD       Future Appointments             In 2 months Carlynn Purl, Danna Hefty, MD [facility], [facility]

## 2022-10-07 ENCOUNTER — Other Ambulatory Visit: Payer: Self-pay | Admitting: Family Medicine

## 2022-10-07 MED ORDER — ONETOUCH ULTRA BLUE VI STRP: ORAL_STRIP | 2 refills | Status: AC

## 2022-10-07 NOTE — Telephone Encounter (Signed)
Requested medication (s) are due for refill today: yes  Requested medication (s) are on the active medication list: yes  Last refill:  06/18/18  Future visit scheduled: yes  Notes to clinic: Unable to refill per protocol, new script needed, routing for review.      Requested Prescriptions  Pending Prescriptions Disp Refills   glucose blood (ONE TOUCH ULTRA TEST) test strip 100 each 2    Sig: CHECK FASTING BLOOD SUGARS TWICE DAILY     Endocrinology: Diabetes - Testing Supplies Passed - 10/07/2022 10:25 AM      Passed - Valid encounter within last 12 months    Recent Outpatient Visits           4 days ago Type 2 diabetes with nephropathy [facility])   [facility] Alba Cory, MD   3 months ago Type 2 diabetes with nephropathy [facility])   [place] [facility] Alba Cory, MD   7 months ago Type 2 diabetes with nephropathy [facility])   [place] [facility] Alba Cory, MD   1 year ago Essential (primary) hypertension   [place] [facility] Margarita Mail, DO   1 year ago Diabetes mellitus type 2 in obese [facility])   [facility] Alba Cory, MD       Future Appointments             In 2 months Carlynn Purl, Danna Hefty, MD [facility]

## 2022-10-07 NOTE — Telephone Encounter (Signed)
Medication Refill - Medication: glucose blood (ONE TOUCH ULTRA TEST) test strip   Has the patient contacted their pharmacy? yes (Agent: If no, request that the patient contact the pharmacy for the refill. If patient does not wish to contact the pharmacy document the reason why and proceed with request.) (Agent: If yes, when and what did the pharmacy advise?)contact pcp  Preferred Pharmacy (with phone number or street name):  [id number] [place], [place] - [address] Phone: [phone]  Fax: [phone]     Has the patient been seen for an appointment in the last year OR does the patient have an upcoming appointment? yes  Agent: Please be advised that RX refills may take up to 3 business days. We ask that you follow-up with your pharmacy.

## 2022-10-12 ENCOUNTER — Encounter (HOSPITAL_COMMUNITY): Payer: Self-pay

## 2022-10-12 ENCOUNTER — Ambulatory Visit (HOSPITAL_COMMUNITY)
Admission: RE | Admit: 2022-10-12 | Discharge: 2022-10-12 | Disposition: A | Discharge status: Home / Self Care | Payer: 59 | Source: Ambulatory Visit | Attending: Gastroenterology | Admitting: Gastroenterology

## 2022-10-12 DIAGNOSIS — R140 Abdominal distension (gaseous): Secondary | ICD-10-CM | POA: Insufficient documentation

## 2022-10-12 DIAGNOSIS — R1031 Right lower quadrant pain: Secondary | ICD-10-CM | POA: Insufficient documentation

## 2022-10-12 DIAGNOSIS — R1032 Left lower quadrant pain: Secondary | ICD-10-CM | POA: Diagnosis present

## 2022-10-12 MED ORDER — IOHEXOL 300 MG/ML  SOLN
100.0000 mL | Freq: Once | INTRAMUSCULAR | Status: AC | PRN
  Administered 2022-10-12: 100 mL via INTRAVENOUS

## 2022-10-12 MED ORDER — SODIUM CHLORIDE (PF) 0.9 % IJ SOLN
INTRAMUSCULAR | Status: AC
  Filled 2022-10-12: qty 50

## 2022-11-17 ENCOUNTER — Other Ambulatory Visit: Payer: Self-pay | Admitting: Family Medicine

## 2022-11-17 DIAGNOSIS — E559 Vitamin D deficiency, unspecified: Secondary | ICD-10-CM

## 2022-11-27 ENCOUNTER — Other Ambulatory Visit: Payer: Self-pay | Admitting: Family Medicine

## 2023-01-02 NOTE — Progress Notes (Deleted)
Name: Wendy [name]   MRN: [id number]    DOB: 07/19/1960   Date:01/02/2023       Progress Note  Subjective  Chief Complaint  Follow Up  HPI  DM with nephropathy, retinopathy:  she is currently using half dose of Ozempic 2 mg , she has hiccups , not taking Jardiance. Advised to go down to Ozempic 1 mg and go back on Jardiance. A1C is 7.2 % still not at goal.  She denies polyphagia, polydipsia or polyuria. She has retinopathy and reminded her to keep up with eye exam. She also has dyslipidemia, HTN, obesity.   HTN: today she has been compliant with Telmisartan. HCTZ, BP is at goal, denies chest pain, palpitation or sob . Continue medication. BP is at goal    Hyperlipidemia: she is back on Atorvastatin and last LDL was back to normal at 61. Continue medication   Morbid obesity: eating smaller portions, she is now on Ozempic and also needs to resume Jardiance    Hot Flashes/Depression and GAD: she has been on Effexor,she states it helps with hot flashes, she can only tolerate effexor one in am and one at lunch , but not 150 mg  because it makes her feel sick . Unchanged   Abdominal cramping weekly, bloating daily and is due for colonoscopy , Dr. Russella Dar ordered a CT abdomen and pelvis that is due 10/12/2022 , she is telling me today that she also sees blood in her urine , going on for the past year, we will check urinalysis and if positive wait for CT and consider referral urologist, if negative must return for pelvic exam . She also wants to see gyn, referral already placed for hot flashes   Patient Active Problem List   Diagnosis Date Noted   GAD (generalized anxiety disorder) 08/13/2021   Endometrial polyp 11/29/2017   Fibroid 11/29/2017   Post-menopausal bleeding 11/14/2017   Mild nonproliferative diabetic retinopathy of both eyes associated with type 2 diabetes mellitus (HCC) 06/09/2016   Diabetes mellitus type 2 in obese 03/01/2016   Vitamin D deficiency 07/07/2015   Vitamin B12 deficiency  07/07/2015   Snoring 12/25/2014   Allergic rhinitis, seasonal 12/14/2014   Chronic constipation 12/14/2014   Diabetes mellitus with renal manifestation (HCC) 12/14/2014   Dyslipidemia 12/14/2014   Dermatitis, eczematoid 12/14/2014   Edema extremities 12/14/2014   Essential (primary) hypertension 12/14/2014   Gastro-esophageal reflux disease without esophagitis 12/14/2014   Herpes simplex vulvovaginitis 12/14/2014   Morbid obesity (HCC) 12/14/2014   Female climacteric state 12/14/2014    Past Surgical History:  Procedure Laterality Date   APPLICATION OF WOUND VAC  04/2018   CESAREAN SECTION  1993   LAPAROTOMY N/A 05/14/2018   Procedure: EXPLORATORY LAPAROTOMY;  Surgeon: Violeta Gelinas, MD;  Location: [facility] OR;  Service: General;  Laterality: N/A;   LAPAROTOMY N/A 05/16/2018   Procedure: EXPLORATORY LAPAROTOMY with reanatomosis and fascia closure with wound vacum application;  Surgeon: Harriette Bouillon, MD;  Location: [facility] OR;  Service: General;  Laterality: N/A;   tonsillectomy and adnoidectomy     WISDOM TOOTH EXTRACTION      Family History  Problem Relation Age of Onset   Diabetes Mother    Asthma Mother    Heart disease Mother    Heart disease Father    Prostate cancer Father    Other Father        blockages in stomach   Kidney disease Paternal Grandmother    Asthma Son    Autism Son  Breast cancer Neg Hx    Colon cancer Neg Hx    Esophageal cancer Neg Hx    Pancreatic cancer Neg Hx    Rectal cancer Neg Hx    Stomach cancer Neg Hx     Social History   Tobacco Use   Smoking status: Former    Current packs/day: 0.00    Types: Cigarettes    Start date: 04/19/1971    Quit date: 04/2018    Years since quitting: 4.7   Smokeless tobacco: Never   Tobacco comments:    she quit and started back October 2019, but quit again 04/2018  Substance Use Topics   Alcohol use: No    Alcohol/week: 0.0 standard drinks of alcohol     Current Outpatient Medications:     acetaminophen (TYLENOL) 325 MG tablet, Take 2 tablets (650 mg total) by mouth every 6 (six) hours as needed for mild pain or headache., Disp: 25 tablet, Rfl: 0   blood glucose meter kit and supplies, Dispense based on patient and insurance preference. Use up to four times daily as directed. (FOR ICD-10 E10.9, E11.9)., Disp: 1 each, Rfl: 2   Blood Glucose Monitoring Suppl (ONE TOUCH ULTRA 2) w/Device KIT, 1 kit by Subdermal route See admin instructions., Disp: 1 kit, Rfl: 0   busPIRone (BUSPAR) 5 MG tablet, Take 1 tablet (5 mg total) by mouth 2 (two) times daily., Disp: 180 tablet, Rfl: 0   empagliflozin (JARDIANCE) 25 MG TABS tablet, Take 1 tablet (25 mg total) by mouth daily before breakfast. In place of synjardi (Patient not taking: Reported on 10/03/2022), Disp: 90 tablet, Rfl: 1   glucose blood (ONE TOUCH ULTRA TEST) test strip, CHECK FASTING BLOOD SUGARS TWICE DAILY, Disp: 100 each, Rfl: 2   levocetirizine (XYZAL) 5 MG tablet, Take 1 tablet (5 mg total) by mouth every evening. TAKE 1 TABLET BY MOUTH EVERY DAY IN THE EVENING, Disp: 90 tablet, Rfl: 0   ONETOUCH DELICA LANCETS FINE MISC, See admin instructions., Disp: , Rfl: 0   rosuvastatin (CRESTOR) 40 MG tablet, Take 1 tablet (40 mg total) by mouth daily. In place of Atorvastatin, Disp: 90 tablet, Rfl: 1   Semaglutide, 1 MG/DOSE, 4 MG/3ML SOPN, Inject 1 mg as directed once a week., Disp: 9 mL, Rfl: 0   telmisartan-hydrochlorothiazide (MICARDIS HCT) 80-25 MG tablet, Take 1 tablet by mouth daily., Disp: 90 tablet, Rfl: 1   valACYclovir (VALTREX) 500 MG tablet, Take 1 tablet (500 mg total) by mouth daily., Disp: 100 tablet, Rfl: 1   venlafaxine XR (EFFEXOR-XR) 75 MG 24 hr capsule, Take 1 capsule (75 mg total) by mouth daily with breakfast., Disp: 90 capsule, Rfl: 1   Vitamin D, Ergocalciferol, (DRISDOL) 1.25 MG (50000 UNIT) CAPS capsule, Take 1 capsule (50,000 Units total) by mouth every 7 (seven) days., Disp: 12 capsule, Rfl: 1  Allergies  Allergen  Reactions   Penicillins Swelling    Did it involve swelling of the face/tongue/throat, SOB, or low BP? Yes Did it involve sudden or severe rash/hives, skin peeling, or any reaction on the inside of your mouth or nose? No Did you need to seek medical attention at a hospital or doctor's office? Yes When did it last happen? unknown If all above answers are "NO", may proceed with cephalosporin use.     I personally reviewed active problem list, medication list, allergies, family history, social history, health maintenance with the patient/caregiver today.   ROS  ***  Objective  There were no vitals  filed for this visit.  There is no height or weight on file to calculate BMI.  Physical Exam ***  No results found for this or any previous visit (from the past 2160 hour(s)).   PHQ2/9:    06/20/2022    3:00 PM 02/11/2022    2:28 PM 08/13/2021    1:56 PM 03/10/2021    2:39 PM 01/27/2021    2:07 PM  Depression screen PHQ 2/9  Decreased Interest 3 3 0 0 0  Down, Depressed, Hopeless 3 0 0 1 0  PHQ - 2 Score 6 3 0 1 0  Altered sleeping 3 0 0 3 0  Tired, decreased energy 3 0 0 3 0  Change in appetite 1 0 0 0 0  Feeling bad or failure about yourself  0 0 0 0 0  Trouble concentrating 0 0 0 0 0  Moving slowly or fidgety/restless 0 0 0 1 0  Suicidal thoughts 0 0 0 0 0  PHQ-9 Score 13 3 0 8 0  Difficult doing work/chores Very difficult Not difficult at all Not difficult at all Not difficult at all Not difficult at all    phq 9 is {gen pos [id number]}   Fall Risk:    10/03/2022    2:09 PM 06/20/2022    3:00 PM 02/11/2022    2:28 PM 08/13/2021    1:55 PM 03/10/2021    2:39 PM  Fall Risk   Falls in the past year? 0 0 0 0 0  Number falls in past yr:   0 0   Injury with Fall?   0 0   Risk for fall due to : No Fall Risks No Fall Risks No Fall Risks    Follow up Falls prevention discussed Falls prevention discussed;Education provided;Falls evaluation completed Education  provided;Falls evaluation completed;Falls prevention discussed  Falls prevention discussed      Functional Status Survey:      Assessment & Plan  *** There are no diagnoses linked to this encounter.

## 2023-01-03 ENCOUNTER — Ambulatory Visit: Payer: 59 | Admitting: Family Medicine

## 2023-01-03 DIAGNOSIS — E1121 Type 2 diabetes mellitus with diabetic nephropathy: Secondary | ICD-10-CM

## 2023-01-04 ENCOUNTER — Telehealth: Payer: Self-pay | Admitting: Family Medicine

## 2023-01-04 ENCOUNTER — Other Ambulatory Visit: Payer: Self-pay | Admitting: Family Medicine

## 2023-01-04 DIAGNOSIS — E1121 Type 2 diabetes mellitus with diabetic nephropathy: Secondary | ICD-10-CM

## 2023-01-04 MED ORDER — SEMAGLUTIDE (1 MG/DOSE) 4 MG/3ML ~~LOC~~ SOPN: 1.0000 mg | PEN_INJECTOR | SUBCUTANEOUS | 0 refills | Status: DC

## 2023-01-04 NOTE — Telephone Encounter (Signed)
Sch'd pt for Dr Carlynn Purl first availability Nov 6 (she did not want to see another provider). She is asking that Dr Carlynn Purl refill her ozempic as she will be out in about 2 weeks. She is asking that enough be called in until her scheduled appt

## 2023-02-21 NOTE — Progress Notes (Unsigned)
Name: Wendy [name]   MRN: [id number]    DOB: 1961/02/03   Date:02/22/2023       Progress Note  Subjective  Chief Complaint  Medication Refill  HPI  DM with nephropathy, retinopathy:  she is currently on Ozempic 1 mg, she stopped Jardiance due to pharmacist telling her it was dangerous to take Ozempic and Jardiance together ( advised her to call me if her pharmacist tells her that is a potential for interaction) . She has not been taking Jardiance over the past month. Today A1C is down to 6% and she has lost weight.  She denies polyphagia, polydipsia or polyuria. She has retinopathy and states unable to get a visit at [facility]. Advised her to find another provider. I will place a referral again today   HTN: today she has been compliant with Telmisartan- HCTZ, BP is at goal, denies chest pain, palpitation or sob . Continue meds    Hyperlipidemia: she is back on Atorvastatin and last LDL was back to normal at 61. She is due for repeat labs today    Morbid obesity: eating smaller portions, she is now on Ozempic 1 mg, at one point she had nausea with the 2 mg dose but states she thinks she can handle it now. She was 225 lbs in June and today her weight is down 18 lbs    Hot Flashes/Depression and GAD: she has been on Effexor,she states it helps with hot flashes, she can only tolerate effexor one in am and one at lunch , but not 150 mg at once because it causes nausea  Abdominal cramping : it was intermittent this spring and she saw  Dr. Russella Dar ordered a CT abdomen and pelvis that is due 10/12/2022  She was found to have a gallstone, noticing nausea with certain smells . Colonoscopy not due until 2028   Post menopausal bleeding: intermittent, states pink in color . Explained importance of seeing gyn . She wants to go to gyn in [place]. Explained it may be cancer   Patient Active Problem List   Diagnosis Date Noted   GAD (generalized anxiety disorder) 08/13/2021   Endometrial polyp  11/29/2017   Fibroid 11/29/2017   Post-menopausal bleeding 11/14/2017   Mild nonproliferative diabetic retinopathy of both eyes associated with type 2 diabetes mellitus (HCC) 06/09/2016   Type 2 diabetes mellitus with obesity (HCC) 03/01/2016   Vitamin D deficiency 07/07/2015   Vitamin B12 deficiency 07/07/2015   Snoring 12/25/2014   Allergic rhinitis, seasonal 12/14/2014   Chronic constipation 12/14/2014   Diabetes mellitus with renal manifestation (HCC) 12/14/2014   Dyslipidemia 12/14/2014   Dermatitis, eczematoid 12/14/2014   Edema of extremities 12/14/2014   Essential (primary) hypertension 12/14/2014   Gastro-esophageal reflux disease without esophagitis 12/14/2014   Herpes simplex vulvovaginitis 12/14/2014   Morbid obesity (HCC) 12/14/2014   Female climacteric state 12/14/2014    Past Surgical History:  Procedure Laterality Date   APPLICATION OF WOUND VAC  04/2018   CESAREAN SECTION  1993   LAPAROTOMY N/A 05/14/2018   Procedure: EXPLORATORY LAPAROTOMY;  Surgeon: Violeta Gelinas, MD;  Location: [facility] OR;  Service: General;  Laterality: N/A;   LAPAROTOMY N/A 05/16/2018   Procedure: EXPLORATORY LAPAROTOMY with reanatomosis and fascia closure with wound vacum application;  Surgeon: Harriette Bouillon, MD;  Location: [facility] OR;  Service: General;  Laterality: N/A;   tonsillectomy and adnoidectomy     WISDOM TOOTH EXTRACTION      Family History  Problem Relation Age of Onset  Diabetes Mother    Asthma Mother    Heart disease Mother    Heart disease Father    Prostate cancer Father    Other Father        blockages in stomach   Kidney disease Paternal Grandmother    Asthma Son    Autism Son    Breast cancer Neg Hx    Colon cancer Neg Hx    Esophageal cancer Neg Hx    Pancreatic cancer Neg Hx    Rectal cancer Neg Hx    Stomach cancer Neg Hx     Social History   Tobacco Use   Smoking status: Former    Current packs/day: 0.00    Types: Cigarettes    Start date: 04/19/1971     Quit date: 04/2018    Years since quitting: 4.8   Smokeless tobacco: Never   Tobacco comments:    she quit and started back October 2019, but quit again 04/2018  Substance Use Topics   Alcohol use: No    Alcohol/week: 0.0 standard drinks of alcohol     Current Outpatient Medications:    acetaminophen (TYLENOL) 325 MG tablet, Take 2 tablets (650 mg total) by mouth every 6 (six) hours as needed for mild pain or headache., Disp: 25 tablet, Rfl: 0   blood glucose meter kit and supplies, Dispense based on patient and insurance preference. Use up to four times daily as directed. (FOR ICD-10 E10.9, E11.9)., Disp: 1 each, Rfl: 2   Blood Glucose Monitoring Suppl (ONE TOUCH ULTRA 2) w/Device KIT, 1 kit by Subdermal route See admin instructions., Disp: 1 kit, Rfl: 0   glucose blood (ONE TOUCH ULTRA TEST) test strip, CHECK FASTING BLOOD SUGARS TWICE DAILY, Disp: 100 each, Rfl: 2   ONETOUCH DELICA LANCETS FINE MISC, See admin instructions., Disp: , Rfl: 0   Semaglutide, 2 MG/DOSE, 8 MG/3ML SOPN, Inject 2 mg as directed once a week., Disp: 9 mL, Rfl: 1   valACYclovir (VALTREX) 500 MG tablet, Take 1 tablet (500 mg total) by mouth daily., Disp: 100 tablet, Rfl: 1   busPIRone (BUSPAR) 5 MG tablet, Take 1 tablet (5 mg total) by mouth 2 (two) times daily., Disp: 180 tablet, Rfl: 0   levocetirizine (XYZAL) 5 MG tablet, Take 1 tablet (5 mg total) by mouth every evening. TAKE 1 TABLET BY MOUTH EVERY DAY IN THE EVENING, Disp: 90 tablet, Rfl: 1   rosuvastatin (CRESTOR) 40 MG tablet, Take 1 tablet (40 mg total) by mouth daily. In place of Atorvastatin, Disp: 90 tablet, Rfl: 1   telmisartan-hydrochlorothiazide (MICARDIS HCT) 80-25 MG tablet, Take 1 tablet by mouth daily., Disp: 90 tablet, Rfl: 1   venlafaxine XR (EFFEXOR-XR) 75 MG 24 hr capsule, Take 1 capsule (75 mg total) by mouth daily with breakfast., Disp: 90 capsule, Rfl: 1  Allergies  Allergen Reactions   Penicillins Swelling    Did it involve swelling of  the face/tongue/throat, SOB, or low BP? Yes Did it involve sudden or severe rash/hives, skin peeling, or any reaction on the inside of your mouth or nose? No Did you need to seek medical attention at a hospital or doctor's office? Yes When did it last happen? unknown If all above answers are "NO", may proceed with cephalosporin use.     I personally reviewed active problem list, medication list, allergies, family history, social history, health maintenance with the patient/caregiver today.   ROS  Constitutional: Negative for fever, positive for  weight change.  Respiratory: Negative  for cough and shortness of breath.   Cardiovascular: Negative for chest pain or palpitations.  Gastrointestinal: Negative for abdominal pain, no bowel changes.  Musculoskeletal: Negative for gait problem or joint swelling.  Skin: Negative for rash.  Neurological: Negative for dizziness or headache.  No other specific complaints in a complete review of systems (except as listed in HPI above).   Objective  Vitals:   02/22/23 1528  BP: 124/76  Pulse: 91  Resp: 16  Temp: 97.7 F (36.5 C)  TempSrc: Oral  SpO2: 98%  Weight: 207 lb 11.2 oz (94.2 kg)  Height: 5' 3.5" (1.613 m)    Body mass index is 36.22 kg/m.  Physical Exam  Constitutional: Patient appears well-developed and well-nourished. Obese  No distress.  HEENT: head atraumatic, normocephalic, pupils equal and reactive to light, neck supple, Cardiovascular: Normal rate, regular rhythm and normal heart sounds.  No murmur heard. No BLE edema. Pulmonary/Chest: Effort normal and breath sounds normal. No respiratory distress. Abdominal: Soft.  There is no tenderness. Psychiatric: Patient has a normal mood and affect. behavior is normal. Judgment and thought content normal.   Recent Results (from the past 2160 hour(s))  POCT HgB A1C     Status: Abnormal   Collection Time: 02/22/23  3:40 PM  Result Value Ref Range   Hemoglobin A1C 6.0 (A) 4.0  - 5.6 %   HbA1c POC (<> result, manual entry)     HbA1c, POC (prediabetic range)     HbA1c, POC (controlled diabetic range)      Diabetic Foot Exam: Diabetic Foot Exam - Simple   Simple Foot Form Visual Inspection No deformities, no ulcerations, no other skin breakdown bilaterally: Yes Sensation Testing Intact to touch and monofilament testing bilaterally: Yes Pulse Check Posterior Tibialis and Dorsalis pulse intact bilaterally: Yes Comments      PHQ2/9:    02/22/2023    3:38 PM 06/20/2022    3:00 PM 02/11/2022    2:28 PM 08/13/2021    1:56 PM 03/10/2021    2:39 PM  Depression screen PHQ 2/9  Decreased Interest 1 3 3  0 0  Down, Depressed, Hopeless 1 3 0 0 1  PHQ - 2 Score 2 6 3  0 1  Altered sleeping 0 3 0 0 3  Tired, decreased energy 2 3 0 0 3  Change in appetite 0 1 0 0 0  Feeling bad or failure about yourself  0 0 0 0 0  Trouble concentrating 0 0 0 0 0  Moving slowly or fidgety/restless 0 0 0 0 1  Suicidal thoughts 0 0 0 0 0  PHQ-9 Score 4 13 3  0 8  Difficult doing work/chores Not difficult at all Very difficult Not difficult at all Not difficult at all Not difficult at all    phq 9 is positive but she does not want to change medication   Fall Risk:    02/22/2023    3:29 PM 10/03/2022    2:09 PM 06/20/2022    3:00 PM 02/11/2022    2:28 PM 08/13/2021    1:55 PM  Fall Risk   Falls in the past year? 0 0 0 0 0  Number falls in past yr:    0 0  Injury with Fall?    0 0  Risk for fall due to : No Fall Risks No Fall Risks No Fall Risks No Fall Risks   Follow up Falls prevention discussed Falls prevention discussed Falls prevention discussed;Education provided;Falls evaluation completed Education provided;Falls  evaluation completed;Falls prevention discussed       Functional Status Survey: Is the patient deaf or have difficulty hearing?: No Does the patient have difficulty seeing, even when wearing glasses/contacts?: No Does the patient have difficulty  concentrating, remembering, or making decisions?: No Does the patient have difficulty walking or climbing stairs?: No Does the patient have difficulty dressing or bathing?: No Does the patient have difficulty doing errands alone such as visiting a doctor's office or shopping?: No    Assessment & Plan  1. Type 2 diabetes with nephropathy (HCC)  - POCT HgB A1C - Urine Microalbumin w/creat. ratio - HM Diabetes Foot Exam  2. Nonproliferative retinopathy due to secondary diabetes mellitus (HCC)  - Ambulatory referral to Ophthalmology  3. Morbid obesity (HCC)  Losing weight on Ozempic, I am also worried due to post menopausal bleeding, needs to see gyn   4. Post-menopausal bleeding  - Ambulatory referral to Obstetrics / Gynecology  5. Calculus of gallbladder without cholecystitis without obstruction  - Ambulatory referral to General Surgery  6. Nausea  - Ambulatory referral to General Surgery  7. Vitamin B12 deficiency  - B12 and Folate Panel  8. Vitamin D deficiency  - VITAMIN D 25 Hydroxy (Vit-D Deficiency, Fractures)  9. Essential (primary) hypertension  - CBC with Differential/Platelet - COMPLETE METABOLIC PANEL WITH GFR - telmisartan-hydrochlorothiazide (MICARDIS HCT) 80-25 MG tablet; Take 1 tablet by mouth daily.  Dispense: 90 tablet; Refill: 1  10. Dyslipidemia  - Lipid panel - rosuvastatin (CRESTOR) 40 MG tablet; Take 1 tablet (40 mg total) by mouth daily. In place of Atorvastatin  Dispense: 90 tablet; Refill: 1  11. GAD (generalized anxiety disorder)  - venlafaxine XR (EFFEXOR-XR) 75 MG 24 hr capsule; Take 1 capsule (75 mg total) by mouth daily with breakfast.  Dispense: 90 capsule; Refill: 1 - busPIRone (BUSPAR) 5 MG tablet; Take 1 tablet (5 mg total) by mouth 2 (two) times daily.  Dispense: 180 tablet; Refill: 0

## 2023-02-22 ENCOUNTER — Encounter: Payer: Self-pay | Admitting: Family Medicine

## 2023-02-22 ENCOUNTER — Ambulatory Visit: Payer: 59 | Admitting: Family Medicine

## 2023-02-22 VITALS — BP 124/76 | HR 91 | Temp 97.7°F | Resp 16 | Ht 63.5 in | Wt 207.7 lb

## 2023-02-22 DIAGNOSIS — Z23 Encounter for immunization: Secondary | ICD-10-CM

## 2023-02-22 DIAGNOSIS — N950 Postmenopausal bleeding: Secondary | ICD-10-CM | POA: Diagnosis not present

## 2023-02-22 DIAGNOSIS — Z7985 Long-term (current) use of injectable non-insulin antidiabetic drugs: Secondary | ICD-10-CM | POA: Diagnosis not present

## 2023-02-22 DIAGNOSIS — E785 Hyperlipidemia, unspecified: Secondary | ICD-10-CM

## 2023-02-22 DIAGNOSIS — E538 Deficiency of other specified B group vitamins: Secondary | ICD-10-CM

## 2023-02-22 DIAGNOSIS — K8020 Calculus of gallbladder without cholecystitis without obstruction: Secondary | ICD-10-CM

## 2023-02-22 DIAGNOSIS — R110 Nausea: Secondary | ICD-10-CM

## 2023-02-22 DIAGNOSIS — E133299 Other specified diabetes mellitus with mild nonproliferative diabetic retinopathy without macular edema, unspecified eye: Secondary | ICD-10-CM | POA: Diagnosis not present

## 2023-02-22 DIAGNOSIS — E559 Vitamin D deficiency, unspecified: Secondary | ICD-10-CM

## 2023-02-22 DIAGNOSIS — E1121 Type 2 diabetes mellitus with diabetic nephropathy: Secondary | ICD-10-CM

## 2023-02-22 DIAGNOSIS — F411 Generalized anxiety disorder: Secondary | ICD-10-CM

## 2023-02-22 DIAGNOSIS — I10 Essential (primary) hypertension: Secondary | ICD-10-CM

## 2023-02-22 LAB — POCT GLYCOSYLATED HEMOGLOBIN (HGB A1C): Hemoglobin A1C: 6 % — AB (ref 4.0–5.6)

## 2023-02-22 MED ORDER — BUSPIRONE HCL 5 MG PO TABS: 5.0000 mg | ORAL_TABLET | Freq: Two times a day (BID) | ORAL | 0 refills | Status: DC

## 2023-02-22 MED ORDER — ROSUVASTATIN CALCIUM 40 MG PO TABS: 40.0000 mg | ORAL_TABLET | Freq: Every day | ORAL | 1 refills | Status: DC

## 2023-02-22 MED ORDER — LEVOCETIRIZINE DIHYDROCHLORIDE 5 MG PO TABS: 5.0000 mg | ORAL_TABLET | Freq: Every evening | ORAL | 1 refills | Status: DC

## 2023-02-22 MED ORDER — VENLAFAXINE HCL ER 75 MG PO CP24: 75.0000 mg | ORAL_CAPSULE | Freq: Every day | ORAL | 1 refills | Status: DC

## 2023-02-22 MED ORDER — TELMISARTAN-HCTZ 80-25 MG PO TABS: 1.0000 | ORAL_TABLET | Freq: Every day | ORAL | 1 refills | Status: DC

## 2023-02-22 MED ORDER — SEMAGLUTIDE (2 MG/DOSE) 8 MG/3ML ~~LOC~~ SOPN: 2.0000 mg | PEN_INJECTOR | SUBCUTANEOUS | 1 refills | Status: DC

## 2023-02-23 LAB — COMPLETE METABOLIC PANEL WITH GFR
AG Ratio: 1.8 (calc) (ref 1.0–2.5)
ALT: 14 U/L (ref 6–29)
AST: 15 U/L (ref 10–35)
Albumin: 4.1 g/dL (ref 3.6–5.1)
Alkaline phosphatase (APISO): 103 U/L (ref 37–153)
BUN: 10 mg/dL (ref 7–25)
CO2: 32 mmol/L (ref 20–32)
Calcium: 9.4 mg/dL (ref 8.6–10.4)
Chloride: 105 mmol/L (ref 98–110)
Creat: 0.8 mg/dL (ref 0.50–1.05)
Globulin: 2.3 g/dL (ref 1.9–3.7)
Glucose, Bld: 99 mg/dL (ref 65–99)
Potassium: 3.7 mmol/L (ref 3.5–5.3)
Sodium: 145 mmol/L (ref 135–146)
Total Bilirubin: 0.4 mg/dL (ref 0.2–1.2)
Total Protein: 6.4 g/dL (ref 6.1–8.1)
eGFR: 83 mL/min/{1.73_m2} (ref >=60)

## 2023-02-23 LAB — CBC WITH DIFFERENTIAL/PLATELET
Absolute Lymphocytes: 2919 {cells}/uL (ref 850–3900)
Absolute Monocytes: 516 {cells}/uL (ref 200–950)
Basophils Absolute: 45 {cells}/uL (ref 0–200)
Basophils Relative: 0.5 %
Eosinophils Absolute: 436 {cells}/uL (ref 15–500)
Eosinophils Relative: 4.9 %
HCT: 39.1 % (ref 35.0–45.0)
Hemoglobin: 12.9 g/dL (ref 11.7–15.5)
MCH: 29.9 pg (ref 27.0–33.0)
MCHC: 33 g/dL (ref 32.0–36.0)
MCV: 90.7 fL (ref 80.0–100.0)
MPV: 11 fL (ref 7.5–12.5)
Monocytes Relative: 5.8 %
Neutro Abs: 4984 {cells}/uL (ref 1500–7800)
Neutrophils Relative %: 56 %
Platelets: 283 10*3/uL (ref 140–400)
RBC: 4.31 10*6/uL (ref 3.80–5.10)
RDW: 12.7 % (ref 11.0–15.0)
Total Lymphocyte: 32.8 %
WBC: 8.9 10*3/uL (ref 3.8–10.8)

## 2023-02-23 LAB — MICROALBUMIN / CREATININE URINE RATIO
Creatinine, Urine: 350 mg/dL — ABNORMAL HIGH (ref 20–275)
Microalb Creat Ratio: 4 mg/g{creat} (ref <30)
Microalb, Ur: 1.4 mg/dL

## 2023-02-23 LAB — VITAMIN D 25 HYDROXY (VIT D DEFICIENCY, FRACTURES): Vit D, 25-Hydroxy: 58 ng/mL (ref 30–100)

## 2023-02-23 LAB — LIPID PANEL
Cholesterol: 145 mg/dL (ref <200)
HDL: 46 mg/dL — ABNORMAL LOW (ref >=50)
LDL Cholesterol (Calc): 80 mg/dL
Non-HDL Cholesterol (Calc): 99 mg/dL (ref <130)
Total CHOL/HDL Ratio: 3.2 (calc) (ref <5.0)
Triglycerides: 104 mg/dL (ref <150)

## 2023-02-23 LAB — B12 AND FOLATE PANEL
Folate: 9.2 ng/mL
Vitamin B-12: 309 pg/mL (ref 200–1100)

## 2023-03-03 ENCOUNTER — Telehealth: Payer: Self-pay | Admitting: *Deleted

## 2023-03-03 NOTE — Telephone Encounter (Signed)
Patient called for lab results and notified: Creatinine was high but ratio normal Lipid panel is at little worse than last year, please make sure she is taking medications B12 is stable also, needs to take SL B12 daily Other labs normal  Patient states she has been taking her cholesterol mediatation- will advised PCP for review  Patient states she is very fatigued and does not understand the reason if all her labs are normal- she wants to know what next steps would be.

## 2023-03-14 ENCOUNTER — Other Ambulatory Visit (HOSPITAL_COMMUNITY): Payer: Self-pay | Admitting: Surgery

## 2023-03-14 DIAGNOSIS — R110 Nausea: Secondary | ICD-10-CM

## 2023-04-20 ENCOUNTER — Encounter (HOSPITAL_COMMUNITY)
Admission: RE | Admit: 2023-04-20 | Discharge: 2023-04-20 | Disposition: A | Discharge status: Home / Self Care | Payer: 59 | Source: Ambulatory Visit | Attending: Surgery | Admitting: Surgery

## 2023-04-20 ENCOUNTER — Ambulatory Visit (HOSPITAL_COMMUNITY)
Admission: RE | Admit: 2023-04-20 | Discharge: 2023-04-20 | Disposition: A | Discharge status: Home / Self Care | Payer: 59 | Source: Ambulatory Visit | Attending: Surgery | Admitting: Surgery

## 2023-04-20 DIAGNOSIS — R110 Nausea: Secondary | ICD-10-CM | POA: Insufficient documentation

## 2023-04-27 ENCOUNTER — Other Ambulatory Visit (HOSPITAL_COMMUNITY)
Admission: RE | Admit: 2023-04-27 | Discharge: 2023-04-27 | Disposition: A | Discharge status: Home / Self Care | Payer: 59 | Source: Ambulatory Visit | Attending: Obstetrics and Gynecology | Admitting: Obstetrics and Gynecology

## 2023-04-27 ENCOUNTER — Ambulatory Visit: Payer: 59 | Admitting: Obstetrics and Gynecology

## 2023-04-27 ENCOUNTER — Encounter: Payer: Self-pay | Admitting: Obstetrics and Gynecology

## 2023-04-27 ENCOUNTER — Encounter: Payer: 59 | Admitting: Obstetrics and Gynecology

## 2023-04-27 VITALS — BP 113/75 | HR 93 | Wt 202.0 lb

## 2023-04-27 DIAGNOSIS — N950 Postmenopausal bleeding: Secondary | ICD-10-CM

## 2023-04-27 DIAGNOSIS — Z1331 Encounter for screening for depression: Secondary | ICD-10-CM | POA: Diagnosis not present

## 2023-04-27 DIAGNOSIS — Z124 Encounter for screening for malignant neoplasm of cervix: Secondary | ICD-10-CM

## 2023-04-27 MED ORDER — MEGESTROL ACETATE 40 MG PO TABS: 40.0000 mg | ORAL_TABLET | Freq: Two times a day (BID) | ORAL | 5 refills | Status: DC | PRN

## 2023-04-27 NOTE — Progress Notes (Signed)
 NEW GYNECOLOGY PATIENT Patient name: Wendy [name] MRN [id number]  Date of birth: 08-16-1960 Chief Complaint:   Vaginal Bleeding     History:  Wendy [name] is a 63 y.o. [phone] being seen today for PMB. Stopped having a cycle in 2014. Reports 3-4 years of no menses. In 2018 experience cramping, saw GYN in [place] and told she has a fold in her uterus. In 2022, she started having on and off bleeding, up to 2 days where she will saturate a pad, stop, and then resume. Will have gush a fluid just prior to the heavy bleeding. Will have this happen for 2 consecutive months and then stop. Last bleed was 2 weeks ago. Does not recall having an EMB but states had some biopsy that was done in office that was very painful and checking for cancer.  Sexually active and has dyspareunia.       Gynecologic History Patient's last menstrual period was 01/26/2017 (approximate). Contraception: post menopausal status Last Pap:     Component Value Date/Time   DIAGPAP  01/15/2019 1518    - Negative for intraepithelial lesion or malignancy (NILM)   HPVHIGH Negative 01/15/2019 1518   ADEQPAP  01/15/2019 1518    Satisfactory for evaluation; transformation zone component ABSENT.    High Risk HPV: Positive  Adequacy:  Satisfactory for evaluation, transformation zone component PRESENT  Diagnosis:  Atypical squamous cells of undetermined significance (ASC-US )  Last Mammogram:  2024 Last Colonoscopy:  2018  Obstetric History OB History  Gravida Para Term Preterm AB Living  4 1 1  3 1   SAB IAB Ectopic Multiple Live Births  2 1   1     # Outcome Date GA Lbr Len/2nd Weight Sex Type Anes PTL Lv  4 Term 1993     CS-Unspec   LIV  3 SAB           2 SAB           1 IAB             Past Medical History:  Diagnosis Date   Acute eczema    Allergy    Anemia    Anxiety    Chronic constipation with overflow    Diabetes mellitus without complication (HCC)    GERD (gastroesophageal reflux disease)     Herpetic vesicle in vagina    Hyperlipidemia    Hypertension    Leg edema    Morbid obesity (HCC)    Peri-menopause    SBO (small bowel obstruction) (HCC) 05/2018   Snoring     Past Surgical History:  Procedure Laterality Date   APPLICATION OF WOUND VAC  04/2018   CESAREAN SECTION  1993   LAPAROTOMY N/A 05/14/2018   Procedure: EXPLORATORY LAPAROTOMY;  Surgeon: Sebastian Moles, MD;  Location[facility] OR;  Service: General;  Laterality: N/A;   LAPAROTOMY N/A 05/16/2018   Procedure: EXPLORATORY LAPAROTOMY with reanatomosis and fascia closure with wound vacum application;  Surgeon: Vanderbilt Ned, MD;  Location: [facility] OR;  Service: General;  Laterality: N/A;   tonsillectomy and adnoidectomy     WISDOM TOOTH EXTRACTION      Current Outpatient Medications on File Prior to Visit  Medication Sig Dispense Refill   busPIRone  (BUSPAR ) 5 MG tablet Take 1 tablet (5 mg total) by mouth 2 (two) times daily. 180 tablet 0   cyanocobalamin  (VITAMIN B12) 1000 MCG tablet Take by mouth.     levocetirizine (XYZAL ) 5 MG tablet Take 1 tablet (5 mg total) by  mouth every evening. TAKE 1 TABLET BY MOUTH EVERY DAY IN THE EVENING 90 tablet 1   rosuvastatin  (CRESTOR ) 40 MG tablet Take 1 tablet (40 mg total) by mouth daily. In place of Atorvastatin  90 tablet 1   Semaglutide , 2 MG/DOSE, 8 MG/3ML SOPN Inject 2 mg as directed once a week. 9 mL 1   telmisartan -hydrochlorothiazide (MICARDIS  HCT) 80-25 MG tablet Take 1 tablet by mouth daily. 90 tablet 1   venlafaxine  XR (EFFEXOR -XR) 75 MG 24 hr capsule Take 1 capsule (75 mg total) by mouth daily with breakfast. 90 capsule 1   acetaminophen  (TYLENOL ) 325 MG tablet Take 2 tablets (650 mg total) by mouth every 6 (six) hours as needed for mild pain or headache. (Patient not taking: Reported on 04/27/2023) 25 tablet 0   blood glucose meter kit and supplies Dispense based on patient and insurance preference. Use up to four times daily as directed. (FOR ICD-10 E10.9, E11.9). (Patient not  taking: Reported on 04/27/2023) 1 each 2   Blood Glucose Monitoring Suppl (ONE TOUCH ULTRA 2) w/Device KIT 1 kit by Subdermal route See admin instructions. (Patient not taking: Reported on 04/27/2023) 1 kit 0   glucose blood (ONE TOUCH ULTRA TEST) test strip CHECK FASTING BLOOD SUGARS TWICE DAILY (Patient not taking: Reported on 04/27/2023) 100 each 2   ONETOUCH DELICA LANCETS FINE MISC See admin instructions. (Patient not taking: Reported on 04/27/2023)  0   valACYclovir  (VALTREX ) 500 MG tablet Take 1 tablet (500 mg total) by mouth daily. (Patient not taking: Reported on 04/27/2023) 100 tablet 1   No current facility-administered medications on file prior to visit.    Allergies  Allergen Reactions   Penicillins Swelling    Did it involve swelling of the face/tongue/throat, SOB, or low BP? Yes Did it involve sudden or severe rash/hives, skin peeling, or any reaction on the inside of your mouth or nose? No Did you need to seek medical attention at a hospital or doctor's office? Yes When did it last happen? unknown If all above answers are "NO", may proceed with cephalosporin use.     Social History:  reports that she quit smoking about 5 years ago. Her smoking use included cigarettes. She started smoking about 52 years ago. She has never used smokeless tobacco. She reports that she does not drink alcohol and does not use drugs.  Family History  Problem Relation Age of Onset   Diabetes Mother    Asthma Mother    Heart disease Mother    Heart disease Father    Prostate cancer Father    Other Father        blockages in stomach   Kidney disease Paternal Grandmother    Asthma Son    Autism Son    Breast cancer Neg Hx    Colon cancer Neg Hx    Esophageal cancer Neg Hx    Pancreatic cancer Neg Hx    Rectal cancer Neg Hx    Stomach cancer Neg Hx     The following portions of the patient's history were reviewed and updated as appropriate: allergies, current medications, past family history,  past medical history, past social history, past surgical history and problem list.  Review of Systems Pertinent items noted in HPI and remainder of comprehensive ROS otherwise negative.  Physical Exam:  Wt 202 lb (91.6 kg)   LMP 01/26/2017 (Approximate)   BMI 35.22 kg/m  Physical Exam Vitals and nursing note reviewed. Exam conducted with a chaperone present.  Constitutional:  Appearance: Normal appearance.  Pulmonary:     Effort: Pulmonary effort is normal.  Abdominal:     Palpations: Abdomen is soft.  Genitourinary:    General: Normal vulva.     Exam position: Lithotomy position.  Neurological:     Mental Status: She is alert.    Endometrial Biopsy Procedure  Patient identified, informed consent performed,  indication reviewed, consent signed.  Reviewed risk of perforation, pain, bleeding, insufficient sample, etc were reviewd. Time out was performed.  Urine pregnancy test negative.  Speculum placed in the vagina.  Cervix visualized.  Cleaned with Betadine x 2.  Anterior cervix grasped anteriorly with a single tooth tenaculum.  Paracervical block was administered.  Endometrial pipelle was used to draw up 1cc of 1% lidocaine , introduced into the cervical os and instilled into the endometrial cavity.  The pipelle was passed twice without difficulty and sample obtained. Tenaculum was removed, good hemostasis noted.  Patient tolerated procedure well.  Patient was given post-procedure instructions.     Assessment and Plan:   1. Post-menopausal bleeding (Primary) Now s/p uncomplicated EMB. Pelvic US  to assess stripe and overall pelvic structures. Prescription for prn megace  sent to pharmacy. Will follow up results and discuss options for management of PMB.   Discussed etiologies of postmenopausal bleeding, concern about precancerous/hyperplasia or cancerous etiology (5 to 10% percent of cases). Also discussed role of unopposed estrogen exposure in leading to thickened or  proliferative endometrium; and its possible correlation with endometrial hyperplasia/carcinoma.  Discussed that obesity is linked to endometrial pathology given that adipose cells produce extra estrogen (estrone) which can cause the endometrium to have a significant amount of estrogen exposure.  However, she was reassured that endometrial atrophy and endometrial polyps are the most common causes of postmenopausal bleeding.  Uterine bleeding in postmenopausal women is usually light and self-limited. Exclusion of cancer is the main objective; therefore, treatment is usually unnecessary once cancer has been excluded.  The primary goal in the diagnostic evaluation of postmenopausal women with uterine bleeding is to exclude malignancy.   - Surgical pathology - Cytology - PAP - US  PELVIC COMPLETE WITH TRANSVAGINAL; Future - megestrol  (MEGACE ) 40 MG tablet; Take 1 tablet (40 mg total) by mouth 2 (two) times daily as needed.  Dispense: 60 tablet; Refill: 5  2. Screening for cervical cancer Pap collected given that she is due and has abnormal bleeding.  - Cytology - PAP    Routine preventative health maintenance measures emphasized. Please refer to After Visit Summary for other counseling recommendations.   Follow-up: No follow-ups on file.      Carter Quarry, MD Obstetrician & Gynecologist, Faculty Practice [facility] [employer], [facility]

## 2023-05-01 LAB — SURGICAL PATHOLOGY

## 2023-05-02 ENCOUNTER — Ambulatory Visit (HOSPITAL_COMMUNITY): Admission: RE | Admit: 2023-05-02 | Payer: 59 | Source: Ambulatory Visit

## 2023-05-02 ENCOUNTER — Telehealth: Payer: Self-pay | Admitting: Family Medicine

## 2023-05-02 ENCOUNTER — Telehealth: Payer: Self-pay

## 2023-05-02 LAB — CYTOLOGY - PAP
Comment: NEGATIVE
Diagnosis: NEGATIVE
Diagnosis: REACTIVE
High risk HPV: NEGATIVE

## 2023-05-02 NOTE — Telephone Encounter (Signed)
-----   Message from Lorriane Shire sent at 05/02/2023  7:38 AM EST ----- Notify that the tissue obtained from endometrial biopsy was benign. Will see what the ultrasound shows.

## 2023-05-02 NOTE — Telephone Encounter (Signed)
 Patient returning call about test results. Requesting to speak to a nurse. Transferred to nurse Marcelino Duster.

## 2023-05-02 NOTE — Telephone Encounter (Signed)
 Patient called regarding her recent lab results.  Patient notified. Patient rescheduled [place] appointment for 1/28 at 2:30 PM at [facility]. Patient confirmed scheduled appointment.  Marcelino Duster, RN

## 2023-05-04 ENCOUNTER — Encounter (HOSPITAL_COMMUNITY)
Admission: RE | Admit: 2023-05-04 | Discharge: 2023-05-04 | Disposition: A | Discharge status: Home / Self Care | Payer: 59 | Source: Ambulatory Visit | Attending: Surgery | Admitting: Surgery

## 2023-05-04 DIAGNOSIS — R110 Nausea: Secondary | ICD-10-CM | POA: Diagnosis present

## 2023-05-04 MED ORDER — TECHNETIUM TC 99M MEBROFENIN IV KIT
5.5000 | PACK | Freq: Once | INTRAVENOUS | Status: AC | PRN
  Administered 2023-05-04: 5.5 via INTRAVENOUS

## 2023-05-15 ENCOUNTER — Other Ambulatory Visit: Payer: Self-pay | Admitting: Family Medicine

## 2023-05-15 DIAGNOSIS — E785 Hyperlipidemia, unspecified: Secondary | ICD-10-CM

## 2023-05-16 ENCOUNTER — Ambulatory Visit (HOSPITAL_COMMUNITY)
Admission: RE | Admit: 2023-05-16 | Discharge: 2023-05-16 | Disposition: A | Discharge status: Home / Self Care | Payer: 59 | Source: Ambulatory Visit | Attending: Obstetrics and Gynecology | Admitting: Obstetrics and Gynecology

## 2023-05-16 DIAGNOSIS — N950 Postmenopausal bleeding: Secondary | ICD-10-CM | POA: Insufficient documentation

## 2023-05-19 ENCOUNTER — Telehealth: Payer: Self-pay | Admitting: Obstetrics and Gynecology

## 2023-05-19 DIAGNOSIS — R9389 Abnormal findings on diagnostic imaging of other specified body structures: Secondary | ICD-10-CM

## 2023-05-19 DIAGNOSIS — N950 Postmenopausal bleeding: Secondary | ICD-10-CM

## 2023-05-19 NOTE — Telephone Encounter (Signed)
Called and confirmed ID x2. Reviewed ultrasound finding of thickened endometrial stripe prompting better endometrial sampling. Recommend hysteroscopy in OR with dilation and curettage. Patient agreeable. Surgical request placed and will message for preop appointment.

## 2023-05-22 ENCOUNTER — Telehealth: Payer: Self-pay | Admitting: Obstetrics and Gynecology

## 2023-05-22 NOTE — Telephone Encounter (Signed)
Spoke to Ms. Bracken about an appointment that Dr. Briscoe Deutscher.

## 2023-05-30 ENCOUNTER — Ambulatory Visit: Payer: Self-pay | Admitting: Family Medicine

## 2023-06-05 LAB — HM DIABETES EYE EXAM

## 2023-06-07 ENCOUNTER — Telehealth: Payer: 59 | Admitting: Family Medicine

## 2023-06-09 ENCOUNTER — Ambulatory Visit: Payer: 59 | Admitting: Family Medicine

## 2023-06-12 ENCOUNTER — Telehealth: Payer: 59 | Admitting: Obstetrics and Gynecology

## 2023-06-12 DIAGNOSIS — N950 Postmenopausal bleeding: Secondary | ICD-10-CM | POA: Diagnosis not present

## 2023-06-12 DIAGNOSIS — R9389 Abnormal findings on diagnostic imaging of other specified body structures: Secondary | ICD-10-CM

## 2023-06-12 NOTE — Progress Notes (Unsigned)
 GYNECOLOGY VIRTUAL VISIT ENCOUNTER NOTE  Provider location: [facility] {Blank single:[id number]::"[facility]","[facility]"}   Patient location: Home  I connected with Wendy [name] on 06/12/23 at  2:15 PM EST by MyChart Video Encounter and verified that I am speaking with the correct person using two identifiers.   I discussed the limitations, risks, security and privacy concerns of performing an evaluation and management service virtually and the availability of in person appointments. I also discussed with the patient that there may be a patient responsible charge related to this service. The patient expressed understanding and agreed to proceed.   History:  Wendy [name] is a 63 y.o. [phone] female being evaluated today for ***. She denies any abnormal vaginal discharge, bleeding, pelvic pain or other concerns.       Past Medical History:  Diagnosis Date   Acute eczema    Allergy    Anemia    Anxiety    Chronic constipation with overflow    Diabetes mellitus without complication (HCC)    GERD (gastroesophageal reflux disease)    Herpetic vesicle in vagina    Hyperlipidemia    Hypertension    Leg edema    Morbid obesity (HCC)    Peri-menopause    SBO (small bowel obstruction) (HCC) 05/2018   Snoring    Past Surgical History:  Procedure Laterality Date   APPLICATION OF WOUND VAC  04/2018   CESAREAN SECTION  1993   LAPAROTOMY N/A 05/14/2018   Procedure: EXPLORATORY LAPAROTOMY;  Surgeon: Violeta Gelinas, MD;  Location: [facility] OR;  Service: General;  Laterality: N/A;   LAPAROTOMY N/A 05/16/2018   Procedure: EXPLORATORY LAPAROTOMY with reanatomosis and fascia closure with wound vacum application;  Surgeon: Harriette Bouillon, MD;  Location: [facility] OR;  Service: General;  Laterality: N/A;   tonsillectomy and adnoidectomy     WISDOM TOOTH EXTRACTION     The following portions  of the patient's history were reviewed and updated as appropriate: allergies, current medications, past family history, past medical history, past social history, past surgical history and problem list.   Health Maintenance:  Normal pap and negative HRHPV on ***.  Normal mammogram on ***.   Review of Systems:  Pertinent items noted in HPI and remainder of comprehensive ROS otherwise negative.  Physical Exam:   General:  Alert, oriented and cooperative. Patient appears to be in no acute distress.  Mental Status: Normal mood and affect. Normal behavior. Normal judgment and thought content.   Respiratory: Normal respiratory effort, no problems with respiration noted  Rest of physical exam deferred due to type of encounter  Labs and Imaging No results found for this or any previous visit (from the past 2 weeks). US PELVIC COMPLETE WITH TRANSVAGINAL Result Date: 05/16/2023 : PROCEDURE: US PELVIS COMPLETE WITH TRANSVAGINAL HISTORY: Patient is a 63 y/o F with postmenopausal bleeding for six months. LMP unknown. G2P1. COMPARISON: CT AP 10/12/2022, U/S pelvis 11/29/2017. TECHNIQUE: Two-dimensional transabdominal grayscale and color Doppler ultrasound of the pelvis was performed. Transvaginal was also performed. FINDINGS: The uterus is anteverted in position and measures 9.3 x 6.2 x 5.0 cm. It demonstrates a normal, homogeneous echotexture. There is a fundal subserosal fibroid measuring 4.5 x 4.3 cm. The endometrium is thickened for a postmenopausal patient and measures 1.7 cm. It demonstrates multiple cystic lesions. Internal vascularity is noted within the endometrium. Multiple nabothian cysts are noted. The right ovary is not visualized. The left ovary measures 1.7 x 1.2 x 1.2  cm and demonstrates a normal echotexture. There is normal color Doppler flow. There is no fluid present within the cul-de-sac. IMPRESSION: 1. Thickened postmenopausal endometrium with multiple cystic spaces and internal vascularity.  Gynecological consultation and tissue sampling may be recommended for further evaluation. 2.  Fundal fibroid measuring 4.5 cm. 3. Unremarkable ultrasound of the left ovary. The right ovary is not visualized. Thank you for allowing [place] to assist in the care of this patient. Electronically Signed   By: Lestine Box M.D.   On: 05/16/2023 22:39       Assessment and Plan:     There are no diagnoses linked to this encounter.      I discussed the assessment and treatment plan with the patient. The patient was provided an opportunity to ask questions and all were answered. The patient agreed with the plan and demonstrated an understanding of the instructions.   The patient was advised to call back or seek an in-person evaluation/go to the ED if the symptoms worsen or if the condition fails to improve as anticipated.  I provided *** minutes of face-to-face time during this encounter. I also spent *** minutes dedicated to the care of this patient including pre-visit review of records, post visit ordering of medications and appropriate tests or procedures, coordinating care and documenting this visit encounter.    Lorriane Shire, MD [facility] [employer], [facility]

## 2023-06-12 NOTE — Patient Instructions (Signed)
 Hysteroscopy Hysteroscopy is a procedure used to look inside a person's uterus. It should be done right after your period. You may have this procedure if your health care provider wants to: Look for tumors and other growths in the uterus. Know more about your bleeding, fibroids, tumors, polyps, scar tissue, or cancer in the uterus. Know why you aren't able to get pregnant, or why you lose your pregnancies. Find an intrauterine device (IUD) in your body. Put a birth control device in the fallopian tubes. Tell a health care provider about: Any allergies you have. All medicines you are taking. These include vitamins, herbs, eye drops, creams, and over-the-counter medicines. Any problems you or family members have had with anesthesia. Any bleeding problems you have. Any medical conditions or surgeries you've had. Whether you're pregnant or may be pregnant. Whether you have or have had a sexually transmitted infection (STI), or you think you have an STI. What are the risks? Your provider will talk with you about risks. These may include: Bleeding that's worse. Infection. Damage to parts of the uterus or other organs. Allergies to medicines or fluids that are used in the procedure. What happens before the procedure? When to stop eating and drinking Follow instructions from your provider about what you may eat and drink. These may include: 8 hours before your procedure Stop eating most foods. Do not eat meat, fried foods, or fatty foods. Eat only light foods, such as toast or crackers. All liquids are okay except energy drinks and alcohol. 6 hours before your procedure Stop eating. Drink only clear liquids, such as water, clear fruit juice, black coffee, plain tea, and sports drinks. Do not drink energy drinks or alcohol. 2 hours before your procedure Stop drinking all liquids. You may be allowed to take medicines with small sips of water. If you do not follow your provider's  instructions, your procedure may be delayed or canceled. Medicines Ask your provider about: Changing or stopping your regular medicines. These include any diabetes medicines or blood thinners you take. Taking medicines such as aspirin and ibuprofen. These medicines can thin your blood. Do not take them unless your provider tells you to. Taking over-the-counter medicines, vitamins, herbs, and supplements. Medicine may be placed in your cervix the day before the procedure. This medicine causes the cervix to open (dilate). The larger opening makes it easier for the hysteroscope to be inserted into the uterus. General instructions Ask your provider what steps will be taken to help prevent infection. These steps may include: Washing skin with a soap that kills germs. Taking antibiotic medicine. Do not smoke, vape, or use any products that have nicotine or tobacco for at least 4 weeks before the surgery. If you need help quitting, ask your provider. If you will be going home right after the procedure, plan to have an adult: Take you home from the hospital or clinic. You will not be allowed to drive. Care for you for the time you are told. Pee before the procedure begins. What happens during the procedure? You may be given: A sedative. This helps you relax. Anesthesia. This keeps you from feeling pain. It will make you fall asleep for surgery. A small tube with a light and camera called a hysteroscope will be put into your uterus. The camera sends images to a screen in the room so your provider can see the inside of your uterus. Air or fluid will be used to enlarge your uterus to allow your provider to see it  better. In some cases, tissue may be gently scraped from inside the uterus and sent to a lab for testing (biopsy). The procedure may vary among providers and hospitals. What happens after the procedure? Your blood pressure, heart rate, breathing rate, and blood oxygen level will be monitored  until you leave the hospital or clinic. You may have cramps. You may be given medicines for this. You may have bleeding. Bleeding may be light or heavy. This is normal. If you had a biopsy, it is up to you to get the results. Ask your provider, or the department that is doing the procedure, when your results will be ready. This information is not intended to replace advice given to you by your health care provider. Make sure you discuss any questions you have with your health care provider. Document Revised: 08/05/2022 Document Reviewed: 08/05/2022 Elsevier Patient Education  2024 [employer].

## 2023-06-13 ENCOUNTER — Telehealth: Payer: Self-pay

## 2023-06-13 NOTE — Telephone Encounter (Signed)
 Reached out to patient to schedule surgery w/ Dr. Briscoe Deutscher on 07/24/23. Left voicemail asking patient to call me back to schedule.[phone].

## 2023-06-26 ENCOUNTER — Telehealth: Payer: Self-pay

## 2023-06-26 NOTE — Telephone Encounter (Signed)
 Patient returned my call to schedule surgery w/ Dr. Briscoe Deutscher. 07/24/23 is no longer available but I advised we have 1 afternoon spot available on 07/18/23 at 3:30 pm. Patient would like to proceed w/ scheduling. Advised patient would need to arrive by 1:30 pm on 07/18/23. Pre-Op instructions and surgery details were provided. Patient confirmed understanding.

## 2023-07-11 ENCOUNTER — Encounter (HOSPITAL_COMMUNITY): Payer: Self-pay | Admitting: Obstetrics and Gynecology

## 2023-07-11 NOTE — Progress Notes (Signed)
 Spoke w/ via phone for pre-op interview--- Wendy [name] needs dos----    BMP, CBG, EKG and A1C per anesthesia. Surgeon orders requested 07/11/23.     [name] results------ COVID test -----patient states asymptomatic no test needed Arrive at -------1400 NPO after MN NO Solid Food.  Clear liquids from MN until---1300 Pre-Surgery Ensure or G2:  Med rec completed Medications to take morning of surgery -----Buspar and Effexor Diabetic medication -----  GLP1 agonist last dose:06/30/23, Semaglutide GLP1 instructions:No doses at  least 7 days prior to surgery, pt verbalized instructions.  Patient instructed no nail polish to be worn day of surgery Patient instructed to bring photo id and insurance card day of surgery Patient aware to have Driver (ride ) / caregiver    for 24 hours after surgery - Husband Wendy [name] Patient Special Instructions ----- Shower with antibacterial soap. Pre-Op special Instructions -----  Patient verbalized understanding of instructions that were given at this phone interview. Patient denies chest pain, sob, fever, cough at the interview.

## 2023-07-18 ENCOUNTER — Other Ambulatory Visit: Payer: Self-pay

## 2023-07-18 ENCOUNTER — Ambulatory Visit (HOSPITAL_COMMUNITY)
Admission: RE | Admit: 2023-07-18 | Discharge: 2023-07-18 | Disposition: A | Discharge status: Home / Self Care | Attending: Obstetrics and Gynecology | Admitting: Obstetrics and Gynecology

## 2023-07-18 ENCOUNTER — Ambulatory Visit (HOSPITAL_COMMUNITY): Admitting: Certified Registered Nurse Anesthetist

## 2023-07-18 ENCOUNTER — Encounter (HOSPITAL_COMMUNITY): Payer: Self-pay | Admitting: Obstetrics and Gynecology

## 2023-07-18 ENCOUNTER — Encounter (HOSPITAL_COMMUNITY)
Admission: RE | Disposition: A | Discharge status: Home / Self Care | Payer: Self-pay | Source: Home / Self Care | Attending: Obstetrics and Gynecology

## 2023-07-18 ENCOUNTER — Ambulatory Visit (HOSPITAL_BASED_OUTPATIENT_CLINIC_OR_DEPARTMENT_OTHER): Admitting: Certified Registered Nurse Anesthetist

## 2023-07-18 DIAGNOSIS — N950 Postmenopausal bleeding: Secondary | ICD-10-CM | POA: Diagnosis present

## 2023-07-18 DIAGNOSIS — Z833 Family history of diabetes mellitus: Secondary | ICD-10-CM | POA: Insufficient documentation

## 2023-07-18 DIAGNOSIS — I10 Essential (primary) hypertension: Secondary | ICD-10-CM | POA: Insufficient documentation

## 2023-07-18 DIAGNOSIS — E785 Hyperlipidemia, unspecified: Secondary | ICD-10-CM | POA: Diagnosis not present

## 2023-07-18 DIAGNOSIS — E119 Type 2 diabetes mellitus without complications: Secondary | ICD-10-CM | POA: Diagnosis not present

## 2023-07-18 DIAGNOSIS — R9389 Abnormal findings on diagnostic imaging of other specified body structures: Secondary | ICD-10-CM | POA: Diagnosis present

## 2023-07-18 DIAGNOSIS — K219 Gastro-esophageal reflux disease without esophagitis: Secondary | ICD-10-CM | POA: Insufficient documentation

## 2023-07-18 DIAGNOSIS — Z5986 Financial insecurity: Secondary | ICD-10-CM | POA: Insufficient documentation

## 2023-07-18 DIAGNOSIS — Z8249 Family history of ischemic heart disease and other diseases of the circulatory system: Secondary | ICD-10-CM | POA: Insufficient documentation

## 2023-07-18 DIAGNOSIS — N840 Polyp of corpus uteri: Secondary | ICD-10-CM | POA: Diagnosis present

## 2023-07-18 DIAGNOSIS — Z01818 Encounter for other preprocedural examination: Secondary | ICD-10-CM

## 2023-07-18 DIAGNOSIS — Z87891 Personal history of nicotine dependence: Secondary | ICD-10-CM | POA: Diagnosis not present

## 2023-07-18 DIAGNOSIS — F419 Anxiety disorder, unspecified: Secondary | ICD-10-CM | POA: Insufficient documentation

## 2023-07-18 DIAGNOSIS — Z7985 Long-term (current) use of injectable non-insulin antidiabetic drugs: Secondary | ICD-10-CM | POA: Insufficient documentation

## 2023-07-18 HISTORY — PX: HYSTEROSCOPY WITH D & C: SHX1775

## 2023-07-18 LAB — BASIC METABOLIC PANEL WITH GFR
Anion gap: 8 (ref 5–15)
BUN: 6 mg/dL — ABNORMAL LOW (ref 8–23)
CO2: 29 mmol/L (ref 22–32)
Calcium: 8.7 mg/dL — ABNORMAL LOW (ref 8.9–10.3)
Chloride: 105 mmol/L (ref 98–111)
Creatinine, Ser: 0.76 mg/dL (ref 0.44–1.00)
GFR, Estimated: >60 mL/min (ref >60)
Glucose, Bld: 102 mg/dL — ABNORMAL HIGH (ref 70–99)
Potassium: 2.9 mmol/L — ABNORMAL LOW (ref 3.5–5.1)
Sodium: 142 mmol/L (ref 135–145)

## 2023-07-18 LAB — GLUCOSE, CAPILLARY
Glucose-Capillary: 102 mg/dL — ABNORMAL HIGH (ref 70–99)
Glucose-Capillary: 87 mg/dL (ref 70–99)

## 2023-07-18 LAB — HEMOGLOBIN A1C
Hgb A1c MFr Bld: 5.7 % — ABNORMAL HIGH (ref 4.8–5.6)
Mean Plasma Glucose: 116.89 mg/dL

## 2023-07-18 SURGERY — DILATATION AND CURETTAGE /HYSTEROSCOPY
Anesthesia: General | Site: Uterus

## 2023-07-18 MED ORDER — DEXAMETHASONE SODIUM PHOSPHATE 10 MG/ML IJ SOLN
INTRAMUSCULAR | Status: AC
Start: 2023-07-18 — End: ?
  Filled 2023-07-18: qty 1

## 2023-07-18 MED ORDER — CHLORHEXIDINE GLUCONATE 0.12 % MT SOLN
15.0000 mL | Freq: Once | OROMUCOSAL | Status: AC
  Administered 2023-07-18: 15 mL via OROMUCOSAL
  Filled 2023-07-18: qty 15

## 2023-07-18 MED ORDER — ACETAMINOPHEN 500 MG PO TABS
1000.0000 mg | ORAL_TABLET | ORAL | Status: AC
  Administered 2023-07-18: 1000 mg via ORAL
  Filled 2023-07-18: qty 2

## 2023-07-18 MED ORDER — BUPIVACAINE HCL (PF) 0.5 % IJ SOLN
INTRAMUSCULAR | Status: AC
  Filled 2023-07-18: qty 30

## 2023-07-18 MED ORDER — PROPOFOL 10 MG/ML IV BOLUS
INTRAVENOUS | Status: AC
  Filled 2023-07-18: qty 20

## 2023-07-18 MED ORDER — KETOROLAC TROMETHAMINE 30 MG/ML IJ SOLN
INTRAMUSCULAR | Status: AC
  Filled 2023-07-18: qty 1

## 2023-07-18 MED ORDER — SODIUM CHLORIDE 0.9 % IR SOLN
Status: DC | PRN
  Administered 2023-07-18: 3000 mL

## 2023-07-18 MED ORDER — MIDAZOLAM HCL 2 MG/2ML IJ SOLN
INTRAMUSCULAR | Status: DC | PRN
  Administered 2023-07-18: 2 mg via INTRAVENOUS

## 2023-07-18 MED ORDER — KETOROLAC TROMETHAMINE 30 MG/ML IJ SOLN
INTRAMUSCULAR | Status: DC | PRN
  Administered 2023-07-18: 30 mg via INTRAVENOUS

## 2023-07-18 MED ORDER — BUPIVACAINE HCL (PF) 0.5 % IJ SOLN
INTRAMUSCULAR | Status: DC | PRN
  Administered 2023-07-18: 20 mL

## 2023-07-18 MED ORDER — FENTANYL CITRATE (PF) 250 MCG/5ML IJ SOLN
INTRAMUSCULAR | Status: AC
  Filled 2023-07-18: qty 5

## 2023-07-18 MED ORDER — LACTATED RINGERS IV SOLN: INTRAVENOUS | Status: DC

## 2023-07-18 MED ORDER — LIDOCAINE 2% (20 MG/ML) 5 ML SYRINGE
INTRAMUSCULAR | Status: AC
  Filled 2023-07-18: qty 5

## 2023-07-18 MED ORDER — LIDOCAINE 2% (20 MG/ML) 5 ML SYRINGE
INTRAMUSCULAR | Status: DC | PRN
  Administered 2023-07-18: 100 mg via INTRAVENOUS

## 2023-07-18 MED ORDER — ONDANSETRON HCL 4 MG/2ML IJ SOLN
INTRAMUSCULAR | Status: DC | PRN
  Administered 2023-07-18: 4 mg via INTRAVENOUS

## 2023-07-18 MED ORDER — ROCURONIUM BROMIDE 10 MG/ML (PF) SYRINGE
PREFILLED_SYRINGE | INTRAVENOUS | Status: AC
  Filled 2023-07-18: qty 10

## 2023-07-18 MED ORDER — ORAL CARE MOUTH RINSE: 15.0000 mL | Freq: Once | OROMUCOSAL | Status: AC

## 2023-07-18 MED ORDER — ONDANSETRON HCL 4 MG/2ML IJ SOLN
INTRAMUSCULAR | Status: AC
  Filled 2023-07-18: qty 2

## 2023-07-18 MED ORDER — PROPOFOL 10 MG/ML IV BOLUS
INTRAVENOUS | Status: DC | PRN
  Administered 2023-07-18: 170 mg via INTRAVENOUS

## 2023-07-18 MED ORDER — MIDAZOLAM HCL 2 MG/2ML IJ SOLN
INTRAMUSCULAR | Status: AC
  Filled 2023-07-18: qty 2

## 2023-07-18 MED ORDER — STERILE WATER FOR IRRIGATION IR SOLN
Status: DC | PRN
Start: 2023-07-18 — End: 2023-07-18
  Administered 2023-07-18: 1000 mL

## 2023-07-18 SURGICAL SUPPLY — 19 items
CATH ROBINSON RED A/P 16FR (CATHETERS) IMPLANT
DEVICE MYOSURE LITE (MISCELLANEOUS) IMPLANT
DEVICE MYOSURE REACH (MISCELLANEOUS) IMPLANT
GAUZE 4X4 16PLY ~~LOC~~+RFID DBL (SPONGE) IMPLANT
GLOVE BIOGEL PI IND STRL 6.5 (GLOVE) IMPLANT
GLOVE BIOGEL PI IND STRL 7.0 (GLOVE) IMPLANT
GLOVE ECLIPSE 7.0 STRL STRAW (GLOVE) ×1 IMPLANT
GOWN STRL REUS W/ TWL LRG LVL3 (GOWN DISPOSABLE) IMPLANT
GOWN STRL REUS W/ TWL XL LVL3 (GOWN DISPOSABLE) ×1 IMPLANT
GOWN STRL REUS W/TWL LRG LVL3 (GOWN DISPOSABLE) ×1 IMPLANT
KIT PROCEDURE FLUENT (KITS) ×1 IMPLANT
KIT TURNOVER KIT B (KITS) ×1 IMPLANT
MYOSURE XL FIBROID (MISCELLANEOUS) IMPLANT
PACK VAGINAL MINOR WOMEN LF (CUSTOM PROCEDURE TRAY) ×1 IMPLANT
PAD OB MATERNITY 11 LF (PERSONAL CARE ITEMS) ×1 IMPLANT
SEAL CERVICAL OMNI LOK (ABLATOR) IMPLANT
SEAL ROD LENS SCOPE MYOSURE (ABLATOR) ×1 IMPLANT
SOL .9 NS 3000ML IRR UROMATIC (IV SOLUTION) IMPLANT
SYSTEM TISS REMOVAL MYOSURE XL (MISCELLANEOUS) IMPLANT

## 2023-07-18 NOTE — H&P (Signed)
 OB/GYN Pre-Op History and Physical  Wendy [name] is a 63 y.o. [id number] presenting for surgical management of AUB.       Past Medical History:  Diagnosis Date   Acute eczema    Allergy    Anemia    Anxiety    Chronic constipation with overflow    Diabetes mellitus without complication (HCC)    GERD (gastroesophageal reflux disease)    Herpetic vesicle in vagina    Hyperlipidemia    Hypertension    Leg edema    Morbid obesity (HCC)    Peri-menopause    SBO (small bowel obstruction) (HCC) 05/2018   Snoring     Past Surgical History:  Procedure Laterality Date   APPLICATION OF WOUND VAC  04/2018   CESAREAN SECTION  1993   LAPAROTOMY N/A 05/14/2018   Procedure: EXPLORATORY LAPAROTOMY;  Surgeon: Violeta Gelinas, MD;  Location: [facility] OR;  Service: General;  Laterality: N/A;   LAPAROTOMY N/A 05/16/2018   Procedure: EXPLORATORY LAPAROTOMY with reanatomosis and fascia closure with wound vacum application;  Surgeon: Harriette Bouillon, MD;  Location: [facility] OR;  Service: General;  Laterality: N/A;   tonsillectomy and adnoidectomy     WISDOM TOOTH EXTRACTION      OB History  Gravida Para Term Preterm AB Living  4 1 1  3 1   SAB IAB Ectopic Multiple Live Births  2 1   1     # Outcome Date GA Lbr Len/2nd Weight Sex Type Anes PTL Lv  4 Term 1993     CS-Unspec   LIV  3 SAB           2 SAB           1 IAB             Social History   Socioeconomic History   Marital status: Married    Spouse name: Not on file   Number of children: 1   Years of education: Not on file   Highest education level: 12th grade  Occupational History    Comment: unemployed   Tobacco Use   Smoking status: Former    Current packs/day: 0.00    Types: Cigarettes    Start date: 04/19/1971    Quit date: 04/2018    Years since quitting: 5.2   Smokeless tobacco: Never   Tobacco comments:    she quit and started back October 2019, but quit again 04/2018  Vaping Use   Vaping status: Never Used  Substance and  Sexual Activity   Alcohol use: No    Alcohol/week: 0.0 standard drinks of alcohol   Drug use: No   Sexual activity: Yes    Partners: Male    Birth control/protection: Post-menopausal  Other Topics Concern   Not on file  Social History Narrative   She lost her job Dec 2017 , worked for 36 years at the same company .    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   Married and has an adult son that has autism and asthma ( lives with them)    Social Drivers of Health   Financial Resource Strain: Medium Risk (02/06/2018)   Overall Financial Resource Strain (CARDIA)    Difficulty of Paying Living Expenses: Somewhat hard  Food Insecurity: No Food Insecurity (04/27/2023)   Hunger Vital Sign    Worried About Running Out of Food in the Last Year: Never true    Ran  Out of Food in the Last Year: Never true  Transportation Needs: No Transportation Needs (04/27/2023)   PRAPARE - Administrator, Civil Service (Medical): No    Lack of Transportation (Non-Medical): No  Physical Activity: Sufficiently Active (01/15/2019)   Exercise Vital Sign    Days of Exercise per Week: 7 days    Minutes of Exercise per Session: 60 min  Stress: No Stress Concern Present (01/15/2019)   [employer] of Occupational Health - Occupational Stress Questionnaire    Feeling of Stress : Not at all  Social Connections: Moderately Integrated (01/15/2019)   Social Connection and Isolation Panel [NHANES]    Frequency of Communication with Friends and Family: Three times a week    Frequency of Social Gatherings with Friends and Family: Once a week    Attends Religious Services: More than 4 times per year    Active Member of [employer] or Organizations: No    Attends Engineer, structural: Never    Marital Status: Married    Family History  Problem Relation Age of Onset   Diabetes Mother    Asthma Mother    Heart disease Mother    Heart  disease Father    Prostate cancer Father    Other Father        blockages in stomach   Kidney disease Paternal Grandmother    Asthma Son    Autism Son    Breast cancer Neg Hx    Colon cancer Neg Hx    Esophageal cancer Neg Hx    Pancreatic cancer Neg Hx    Rectal cancer Neg Hx    Stomach cancer Neg Hx     Medications Prior to Admission  Medication Sig Dispense Refill Last Dose/Taking   busPIRone (BUSPAR) 5 MG tablet Take 1 tablet (5 mg total) by mouth 2 (two) times daily. 180 tablet 0 Taking   megestrol (MEGACE) 40 MG tablet Take 1 tablet (40 mg total) by mouth 2 (two) times daily as needed. 60 tablet 5 Taking As Needed   rosuvastatin (CRESTOR) 40 MG tablet TAKE 1 TABLET (40 MG TOTAL) BY MOUTH DAILY. IN PLACE OF ATORVASTATIN (Patient taking differently: Take 40 mg by mouth every evening. In place of Atorvastatin) 90 tablet 1 Taking Differently   telmisartan-hydrochlorothiazide (MICARDIS HCT) 80-25 MG tablet Take 1 tablet by mouth daily. 90 tablet 1 Taking   valACYclovir (VALTREX) 500 MG tablet Take 1 tablet (500 mg total) by mouth daily. (Patient taking differently: Take 500 mg by mouth daily as needed (outbreaks).) 100 tablet 1 Taking Differently   venlafaxine XR (EFFEXOR-XR) 75 MG 24 hr capsule Take 1 capsule (75 mg total) by mouth daily with breakfast. (Patient taking differently: Take 75 mg by mouth in the morning and at bedtime.) 90 capsule 1 Taking Differently   cyanocobalamin (VITAMIN B12) 1000 MCG tablet Take by mouth.      glucose blood (ONE TOUCH ULTRA TEST) test strip CHECK FASTING BLOOD SUGARS TWICE DAILY (Patient not taking: Reported on 04/27/2023) 100 each 2    levocetirizine (XYZAL) 5 MG tablet Take 1 tablet (5 mg total) by mouth every evening. TAKE 1 TABLET BY MOUTH EVERY DAY IN THE EVENING 90 tablet 1    ONETOUCH DELICA LANCETS FINE MISC See admin instructions. (Patient not taking: Reported on 04/27/2023)  0    Semaglutide, 2 MG/DOSE, 8 MG/3ML SOPN Inject 2 mg as directed once  a week. (Patient taking differently: Inject 2 mg as directed every Friday.) 9  mL 1     Allergies  Allergen Reactions   Penicillins Swelling    Did it involve swelling of the face/tongue/throat, SOB, or low BP? Yes Did it involve sudden or severe rash/hives, skin peeling, or any reaction on the inside of your mouth or nose? No Did you need to seek medical attention at a hospital or doctor's office? Yes When did it last happen? unknown If all above answers are "NO", may proceed with cephalosporin use.     Review of Systems: Negative except for what is mentioned in HPI.     Physical Exam: Ht 5\' 3"  (1.6 m)   Wt 86.2 kg   LMP 01/26/2017 (Approximate)   BMI 33.66 kg/m  CONSTITUTIONAL: Well-developed, well-nourished and in no acute distress.  HENT:  Normocephalic, atraumatic, External right and left ear normal. Oropharynx is clear and moist EYES: Conjunctivae and EOM are normal. Pupils are equal, round, and reactive to light. No scleral icterus.  NECK: Normal range of motion, supple, no masses SKIN: Skin is warm and dry. No rash noted. Not diaphoretic. No erythema. No pallor. NEUROLGIC: Alert and oriented to person, place, and time. Normal reflexes, muscle tone coordination. No cranial nerve deficit noted. PSYCHIATRIC: Normal mood and affect. Normal behavior. Normal judgment and thought content. RESPIRATORY: Normal effort PELVIC: Deferred   Pertinent Labs/Studies:   No results found for this or any previous visit (from the past 72 hours).     Assessment and Plan :Alphia Behanna is a 63 y.o. [id number] here for surgical management of AUB.   Patient desires surgical management with AUB.  The risks of surgery were discussed in detail with the patient including but not limited to: bleeding which may require transfusion or reoperation; infection which may require prolonged hospitalization or re-hospitalization and antibiotic therapy; injury to bowel, bladder, ureters and major vessels or  other surrounding organs which may lead to other procedures; formation of adhesions; need for additional procedures including laparotomy or subsequent procedures secondary to intraoperative injury or abnormal pathology; thromboembolic phenomenon; incisional problems and other postoperative or anesthesia complications.  Patient was told that the likelihood that her condition and symptoms will be treated effectively with this surgical management was high; the postoperative expectations were also discussed in detail. The patient also understands the alternative treatment options which were discussed in full. All questions were answered.    Lorriane Shire, M.D. Minimally Invasive Gynecologic Surgery and Pelvic Pain Specialist Attending Obstetrician & Gynecologist, Faculty Practice [facility], [facility]

## 2023-07-18 NOTE — Discharge Instructions (Addendum)
 Post-surgical Instructions, Outpatient Surgery  You may expect to feel dizzy, weak, and drowsy for as long as 24 hours after receiving the medicine that made you sleep (anesthetic). For the first 24 hours after your surgery:   Do not drive a car, ride a bicycle, participate in physical activities, or take public transportation until you are done taking narcotic pain medicines or as directed by Dr. Briscoe Deutscher.  Do not drink alcohol or take tranquilizers.  Do not take medicine that has not been prescribed by your physicians.  Do not sign important papers or make important decisions while on narcotic pain medicines.  Have a responsible person with you.   CARE OF INCISION If you have a bandage, you may remove it in one day.  If there are steri-strips or dermabond, just let this loosen on its own.  You may shower on the first day after your surgery.  Do not sit in a tub bath for one week. Avoid heavy lifting (more than 10 pounds/4.5 kilograms), pushing, or pulling.  Avoid activities that may risk injury to your incisions.   PAIN MANAGEMENT Ibuprofen 800mg .  (This is the same as 4-200mg  over the counter tablets of Motrin or ibuprofen.)  Take this every 6 hours or as needed for cramping.   Acetaminophen 1000mg  (This is the same as 2-500mg  over the counter extra strength tylenol). Take this every 6 hours for the first 3 days or as needed afterwards for pain   DO'S AND DON'T'S Do not take a tub bath for 2 weeks.  You may shower on the first day after your surgery Do not do any heavy lifting for one to two weeks.  This increases the chance of bleeding. Do move around as you feel able.  Stairs are fine.  You may begin to exercise again as you feel able.  Do not lift any weights for two weeks. Do not put anything in the vagina for two weeks--no tampons, intercourse, or douching.    REGULAR MEDIATIONS/VITAMINS: You may restart all of your regular medications as prescribed. You may restart all of your  vitamins as you normally take them.    PLEASE CALL OR SEEK MEDICAL CARE IF: You have persistent nausea and vomiting.  You have trouble eating or drinking.  You have an oral temperature above 100.5.  You have constipation that is not helped by adjusting diet or increasing fluid intake. Pain medicines are a common cause of constipation.  You have heavy vaginal bleeding You have redness or drainage from your incision(s) or there is increasing pain or tenderness near or in the surgical site.    Post Anesthesia Home Care Instructions  Activity: Get plenty of rest for the remainder of the day. A responsible adult should stay with you for 24 hours following the procedure.  For the next 24 hours, DO NOT: -Drive a car -Advertising copywriter -Drink alcoholic beverages -Take any medication unless instructed by your physician -Make any legal decisions or sign important papers.  Meals: Start with liquid foods such as gelatin or soup. Progress to regular foods as tolerated. Avoid greasy, spicy, heavy foods. If nausea and/or vomiting occur, drink only clear liquids until the nausea and/or vomiting subsides. Call your physician if vomiting continues.  Special Instructions/Symptoms: Your throat may feel dry or sore from the anesthesia or the breathing tube placed in your throat during surgery. If this causes discomfort, gargle with warm salt water. The discomfort should disappear within 24 hours.

## 2023-07-18 NOTE — Discharge Instr - Supplementary Instructions (Signed)
 Tylenol given at 2:23pm  May take Ibuprofen,Advil,Aleve etc. After 9pm if needed for discomfort.

## 2023-07-18 NOTE — Op Note (Signed)
 [name] PROCEDURE DATE: 07/18/2023  PREOPERATIVE DIAGNOSIS: postmenopausal bleeding  POSTOPERATIVE DIAGNOSIS: postmenopausal bleeding PROCEDURE:    hysteroscopic polypectomy, dilation and curettage SURGEON: Lorriane Shire, MD ASSISTANT:  None  INDICATIONS: Wendy [name] with PMB.  Risks of surgery were discussed with the patient including but not limited to: bleeding which may require transfusion; infection which may require antibiotics; injury to surrounding organs; need for additional procedures including laparotomy;  and other postoperative/anesthesia complications. Written informed consent was obtained.    FINDINGS:  Normal external genitalia, normal appearing cervix Hysteroscopically: large endometrial polyp filling endocervical canal, polypoid within the endometrium and submucusal fibroid vs calcified polyp, bilateral tubal ostia visualized   ANESTHESIA: General, paracervical block INTRAVENOUS FLUIDS:  500 ml of LR ESTIMATED BLOOD LOSS:  10 ml SPECIMENS: endometrial curettings and polyp COMPLICATIONS:  None immediate.  FLUID DEFICIT: 105 ml of normal saline  PROCEDURE: The patient was taken to the operating room and placed under general anesthesia. SCDs were in place.  Time out was performed. Patient was placed in dorsolithotomy in [place] stirrups. She was prepped and draped in the usual sterile fashion. A Red Rubber catheter was used to drain her bladder. A speculum was placeed in the vagina. The cervix was visualized anteriorly and grasped with a single-tooth tenaculum. Paracervical block was performed with 0.5% bupivicaine with 20 cc injected. Sequential dilation was performed with Shawnie Pons dilators. The hysteroscope was inserted and the endometrial cavity and inspected. There were polyps noted in the endometrial cavity with both ostia seen. The myosure reach was used to resect the polyps. The hysteroscope was removed. Sharp curettage was performed in all 4 quadrants. All  instruments were removed from the vagina. All instrument, needle and lap counts were correct x2. The patient was awakened and is recovering in stable condition.  Lorriane Shire, MD Minimally Invasive Gynecologic Surgery  Obstetrics and Gynecology, [facility] for [facility], [facility] 07/18/2023

## 2023-07-18 NOTE — Anesthesia Procedure Notes (Signed)
 Procedure Name: LMA Insertion Date/Time: 07/18/2023 2:54 PM  Performed by: Cy Blamer, CRNAPre-anesthesia Checklist: Patient identified, Emergency Drugs available, Suction available, Patient being monitored and Timeout performed Patient Re-evaluated:Patient Re-evaluated prior to induction Oxygen Delivery Method: Circle system utilized Preoxygenation: Pre-oxygenation with 100% oxygen Induction Type: IV induction LMA: LMA inserted LMA Size: 4.0 Number of attempts: 1 Placement Confirmation: breath sounds checked- equal and bilateral and positive ETCO2 Tube secured with: Tape Dental Injury: Teeth and Oropharynx as per pre-operative assessment

## 2023-07-18 NOTE — Brief Op Note (Signed)
 07/18/2023  3:31 PM  PATIENT:  Wendy [name]  63 y.o. female  PRE-OPERATIVE DIAGNOSIS:  Post-menopausal bleeding Thickened endometrium Polyp  POST-OPERATIVE DIAGNOSIS:  Post-menopausal bleedingThickened endometriumPolyp  PROCEDURE:  Procedure(s): DILATATION AND CURETTAGE /HYSTEROSCOPY WITH POLYPECTOMY (N/A)  SURGEON:  Surgeons and Role:    Lorriane Shire, MD - Primary  PHYSICIAN ASSISTANT: n/a  ASSISTANTS: none   ANESTHESIA:   general and paracervical block  EBL:  10 mL   BLOOD ADMINISTERED:none  DRAINS: none   LOCAL MEDICATIONS USED:  MARCAINE     SPECIMEN:  Source of Specimen:  endometrial polyps and curettings  DISPOSITION OF SPECIMEN:  PATHOLOGY  COUNTS:  YES  TOURNIQUET:  * No tourniquets in log *  DICTATION: .Note written in EPIC  PLAN OF CARE: Discharge to home after PACU  PATIENT DISPOSITION:  PACU - hemodynamically stable.   Delay start of Pharmacological VTE agent (>24hrs) due to surgical blood loss or risk of bleeding: not applicable

## 2023-07-18 NOTE — Transfer of Care (Signed)
 Immediate Anesthesia Transfer of Care Note  Patient: Taryn Shellhammer  Procedure(s) Performed: DILATATION AND CURETTAGE /HYSTEROSCOPY WITH POLYPECTOMY (Uterus)  Patient Location: PACU  Anesthesia Type:General  Level of Consciousness: drowsy and responds to stimulation  Airway & Oxygen Therapy: Patient Spontanous Breathing and Patient connected to face mask oxygen  Post-op Assessment: Report given to RN and Post -op Vital signs reviewed and stable  Post vital signs: Reviewed and stable  Last Vitals:  Vitals Value Taken Time  BP 153/82 (102)   Temp    Pulse 71 07/18/23 1529  Resp 16 07/18/23 1529  SpO2 100 % 07/18/23 1529  Vitals shown include unfiled device data.  Last Pain:  Vitals:   07/18/23 1417  TempSrc: Oral         Complications: There were no known notable events for this encounter.

## 2023-07-18 NOTE — Anesthesia Preprocedure Evaluation (Signed)
 Anesthesia Evaluation  Patient identified by MRN, date of birth, ID band Patient awake    Reviewed: Allergy & Precautions, NPO status , Patient's Chart, lab work & pertinent test results  Airway Mallampati: III  TM Distance: >3 FB Neck ROM: Full    Dental  (+) Teeth Intact, Dental Advisory Given   Pulmonary neg pulmonary ROS, former smoker   Pulmonary exam normal breath sounds clear to auscultation       Cardiovascular hypertension, Pt. on medications Normal cardiovascular exam Rhythm:Regular Rate:Normal     Neuro/Psych  PSYCHIATRIC DISORDERS Anxiety     negative neurological ROS     GI/Hepatic Neg liver ROS,GERD  ,,  Endo/Other  diabetes, Type 2  Obesity   Renal/GU Renal InsufficiencyRenal disease     Musculoskeletal negative musculoskeletal ROS (+)    Abdominal   Peds  Hematology  (+) Blood dyscrasia, anemia   Anesthesia Other Findings Day of surgery medications reviewed with the patient.  Reproductive/Obstetrics  Postmenopausal bleeding                              Anesthesia Physical Anesthesia Plan  ASA: 3  Anesthesia Plan: General   Post-op Pain Management:    Induction: Intravenous  PONV Risk Score and Plan:   Airway Management Planned: Oral ETT  Additional Equipment:   Intra-op Plan:   Post-operative Plan: Extubation in OR  Informed Consent: I have reviewed the patients History and Physical, chart, labs and discussed the procedure including the risks, benefits and alternatives for the proposed anesthesia with the patient or authorized representative who has indicated his/her understanding and acceptance.     Dental advisory given  Plan Discussed with: CRNA  Anesthesia Plan Comments:          Anesthesia Quick Evaluation

## 2023-07-19 ENCOUNTER — Encounter (HOSPITAL_COMMUNITY): Payer: Self-pay | Admitting: Obstetrics and Gynecology

## 2023-07-19 NOTE — Anesthesia Postprocedure Evaluation (Signed)
 Anesthesia Post Note  Patient: Wendy [name]  Procedure(s) Performed: DILATATION AND CURETTAGE /HYSTEROSCOPY WITH POLYPECTOMY (Uterus)     Patient location during evaluation: PACU Anesthesia Type: General Level of consciousness: awake and alert Pain management: pain level controlled Vital Signs Assessment: post-procedure vital signs reviewed and stable Respiratory status: spontaneous breathing, nonlabored ventilation, respiratory function stable and patient connected to nasal cannula oxygen Cardiovascular status: blood pressure returned to baseline and stable Postop Assessment: no apparent nausea or vomiting Anesthetic complications: no   There were no known notable events for this encounter.  Last Vitals:  Vitals:   07/18/23 1417 07/18/23 1525  BP:  (!) 153/82  Pulse:  71  Resp:  16  Temp: 36.8 C   SpO2:  100%    Last Pain:  Vitals:   07/18/23 1417  TempSrc: Oral                 Earl Lites P Ilaisaane Marts

## 2023-07-20 ENCOUNTER — Telehealth: Payer: Self-pay | Admitting: General Practice

## 2023-07-20 LAB — SURGICAL PATHOLOGY

## 2023-07-20 NOTE — Telephone Encounter (Signed)
Called patient, no answer- left message to call us back for results.

## 2023-07-20 NOTE — Telephone Encounter (Signed)
-----   Message from [place] sent at 07/20/2023 11:47 AM EDT ----- Notify that polyp removed during procedure was benign. Hope that she's recovering well and will see at follow up

## 2023-07-24 NOTE — Telephone Encounter (Signed)
 Called pt; VM left stating I am calling with results. Patient may call back for results. Otherwise will review at post-op on 08/28/23.

## 2023-08-28 ENCOUNTER — Ambulatory Visit: Admitting: Obstetrics and Gynecology

## 2023-08-28 NOTE — Progress Notes (Deleted)
    GYNECOLOGY VISIT  Patient name: Wendy [name] MRN [id number]  Date of birth: 1960-05-17 Chief Complaint:   No chief complaint on file.   History:  Wendy [name] is a 63 y.o. [phone] being seen today for ***.    Past Medical History:  Diagnosis Date   Acute eczema    Allergy    Anemia    Anxiety    Chronic constipation with overflow    Diabetes mellitus without complication (HCC)    GERD (gastroesophageal reflux disease)    Herpetic vesicle in vagina    Hyperlipidemia    Hypertension    Leg edema    Morbid obesity (HCC)    Peri-menopause    SBO (small bowel obstruction) (HCC) 05/2018   Snoring     Past Surgical History:  Procedure Laterality Date   APPLICATION OF WOUND VAC  04/2018   CESAREAN SECTION  1993   HYSTEROSCOPY WITH D & C N/A 07/18/2023   Procedure: DILATATION AND CURETTAGE /HYSTEROSCOPY WITH POLYPECTOMY;  Surgeon: Kiki Pelton, MD;  Location: [facility] OR;  Service: Gynecology;  Laterality: N/A;   LAPAROTOMY N/A 05/14/2018   Procedure: EXPLORATORY LAPAROTOMY;  Surgeon: Dorena Gander, MD;  [facility] OR;  Service: General;  Laterality: N/A;   LAPAROTOMY N/A 05/16/2018   Procedure: EXPLORATORY LAPAROTOMY with reanatomosis and fascia closure with wound vacum application;  Surgeon: Sim Dryer, MD;  Location: [facility] OR;  Service: General;  Laterality: N/A;   tonsillectomy and adnoidectomy     WISDOM TOOTH EXTRACTION      The following portions of the patient's history were reviewed and updated as appropriate: allergies, current medications, past family history, past medical history, past social history, past surgical history and problem list.   Health Maintenance:   Last pap ***. Results were: {Pap findings:25134}. H/O abnormal pap: {yes/yes***/no:23866} Last mammogram: ***. Results were: {normal, abnormal, n/a:23837}. Family h/o breast cancer: {yes***/no:23838}   Review of Systems:  {Ros - complete:30496} Comprehensive review of systems was otherwise  negative.   Objective:  Physical Exam LMP 01/26/2017 (Approximate)    Physical Exam   Labs and Imaging No results found.     Assessment & Plan:   There are no diagnoses linked to this encounter.   *** Routine preventative health maintenance measures emphasized.  Kiki Pelton, MD Minimally Invasive Gynecologic Surgery Center for [facility], [facility]

## 2023-10-03 ENCOUNTER — Ambulatory Visit: Payer: Self-pay | Admitting: Family Medicine

## 2023-10-05 ENCOUNTER — Ambulatory Visit: Admitting: Family Medicine

## 2023-10-05 ENCOUNTER — Encounter: Payer: Self-pay | Admitting: Family Medicine

## 2023-10-05 VITALS — BP 124/72 | HR 92 | Resp 16 | Ht 63.0 in | Wt 184.4 lb

## 2023-10-05 DIAGNOSIS — J3089 Other allergic rhinitis: Secondary | ICD-10-CM

## 2023-10-05 DIAGNOSIS — Z7985 Long-term (current) use of injectable non-insulin antidiabetic drugs: Secondary | ICD-10-CM

## 2023-10-05 DIAGNOSIS — E785 Hyperlipidemia, unspecified: Secondary | ICD-10-CM

## 2023-10-05 DIAGNOSIS — E1169 Type 2 diabetes mellitus with other specified complication: Secondary | ICD-10-CM

## 2023-10-05 DIAGNOSIS — K811 Chronic cholecystitis: Secondary | ICD-10-CM

## 2023-10-05 DIAGNOSIS — I10 Essential (primary) hypertension: Secondary | ICD-10-CM | POA: Diagnosis not present

## 2023-10-05 DIAGNOSIS — E538 Deficiency of other specified B group vitamins: Secondary | ICD-10-CM

## 2023-10-05 DIAGNOSIS — E559 Vitamin D deficiency, unspecified: Secondary | ICD-10-CM

## 2023-10-05 DIAGNOSIS — F411 Generalized anxiety disorder: Secondary | ICD-10-CM

## 2023-10-05 MED ORDER — BUSPIRONE HCL 5 MG PO TABS: 5.0000 mg | ORAL_TABLET | Freq: Two times a day (BID) | ORAL | 1 refills | Status: DC

## 2023-10-05 MED ORDER — LEVOCETIRIZINE DIHYDROCHLORIDE 5 MG PO TABS: 5.0000 mg | ORAL_TABLET | Freq: Every evening | ORAL | 1 refills | Status: AC

## 2023-10-05 MED ORDER — SEMAGLUTIDE (2 MG/DOSE) 8 MG/3ML ~~LOC~~ SOPN: 2.0000 mg | PEN_INJECTOR | SUBCUTANEOUS | 1 refills | Status: DC

## 2023-10-05 MED ORDER — VENLAFAXINE HCL ER 75 MG PO CP24: 75.0000 mg | ORAL_CAPSULE | Freq: Two times a day (BID) | ORAL | 0 refills | Status: DC

## 2023-10-05 MED ORDER — ROSUVASTATIN CALCIUM 40 MG PO TABS: 40.0000 mg | ORAL_TABLET | Freq: Every day | ORAL | 1 refills | Status: AC

## 2023-10-05 MED ORDER — TELMISARTAN-HCTZ 80-25 MG PO TABS: 1.0000 | ORAL_TABLET | Freq: Every day | ORAL | 1 refills | Status: AC

## 2023-10-05 MED ORDER — FLUTICASONE PROPIONATE 50 MCG/ACT NA SUSP: 2.0000 | Freq: Every day | NASAL | 1 refills | Status: DC

## 2023-10-05 NOTE — Progress Notes (Unsigned)
 Name: Wendy [name]   MRN: [id number]    DOB: 1961-04-10   Date:10/05/2023       Progress Note  Subjective  Chief Complaint  Chief Complaint  Patient presents with   Medical Management of Chronic Issues   {HPI:32069} *** Patient Active Problem List   Diagnosis Date Noted   Thickened endometrium 07/18/2023   GAD (generalized anxiety disorder) 08/13/2021   Endometrial polyp 11/29/2017   Fibroid 11/29/2017   Post-menopausal bleeding 11/14/2017   Mild nonproliferative diabetic retinopathy of both eyes associated with type 2 diabetes mellitus (HCC) 06/09/2016   Type 2 diabetes mellitus with obesity (HCC) 03/01/2016   Vitamin D  deficiency 07/07/2015   Vitamin B12 deficiency 07/07/2015   Snoring 12/25/2014   Allergic rhinitis, seasonal 12/14/2014   Chronic constipation 12/14/2014   Diabetes mellitus with renal manifestation (HCC) 12/14/2014   Dyslipidemia 12/14/2014   Dermatitis, eczematoid 12/14/2014   Edema of extremities 12/14/2014   Essential (primary) hypertension 12/14/2014   Gastro-esophageal reflux disease without esophagitis 12/14/2014   Herpes simplex vulvovaginitis 12/14/2014   Morbid obesity (HCC) 12/14/2014   Female climacteric state 12/14/2014    Past Surgical History:  Procedure Laterality Date   APPLICATION OF WOUND VAC  04/2018   CESAREAN SECTION  1993   HYSTEROSCOPY WITH D & C N/A 07/18/2023   Procedure: DILATATION AND CURETTAGE /HYSTEROSCOPY WITH POLYPECTOMY;  Surgeon: Kiki Pelton, MD;  Location: [facility] OR;  Service: Gynecology;  Laterality: N/A;   LAPAROTOMY N/A 05/14/2018   Procedure: EXPLORATORY LAPAROTOMY;  Surgeon: Dorena Gander, MD;  Location: [facility] OR;  Service: General;  Laterality: N/A;   LAPAROTOMY N/A 05/16/2018   Procedure: EXPLORATORY LAPAROTOMY with reanatomosis and fascia closure with wound vacum application;  Surgeon: Sim Dryer, MD;  Location: [facility] OR;  Service: General;  Laterality: N/A;   tonsillectomy and adnoidectomy     WISDOM TOOTH  EXTRACTION      Family History  Problem Relation Age of Onset   Diabetes Mother    Asthma Mother    Heart disease Mother    Heart disease Father    Prostate cancer Father    Other Father        blockages in stomach   Kidney disease Paternal Grandmother    Asthma Son    Autism Son    Breast cancer Neg Hx    Colon cancer Neg Hx    Esophageal cancer Neg Hx    Pancreatic cancer Neg Hx    Rectal cancer Neg Hx    Stomach cancer Neg Hx     Social History   Tobacco Use   Smoking status: Former    Current packs/day: 0.00    Types: Cigarettes    Start date: 04/19/1971    Quit date: 04/2018    Years since quitting: 5.4   Smokeless tobacco: Never   Tobacco comments:    she quit and started back October 2019, but quit again 04/2018  Substance Use Topics   Alcohol use: No    Alcohol/week: 0.0 standard drinks of alcohol     Current Outpatient Medications:    busPIRone  (BUSPAR ) 5 MG tablet, Take 1 tablet (5 mg total) by mouth 2 (two) times daily., Disp: 180 tablet, Rfl: 0   cyanocobalamin  (VITAMIN B12) 1000 MCG tablet, Take by mouth., Disp: , Rfl:    levocetirizine (XYZAL ) 5 MG tablet, Take 1 tablet (5 mg total) by mouth every evening. TAKE 1 TABLET BY MOUTH EVERY DAY IN THE EVENING, Disp: 90 tablet, Rfl: 1  rosuvastatin  (CRESTOR ) 40 MG tablet, TAKE 1 TABLET (40 MG TOTAL) BY MOUTH DAILY. IN PLACE OF ATORVASTATIN  (Patient taking differently: Take 40 mg by mouth every evening. In place of Atorvastatin ), Disp: 90 tablet, Rfl: 1   Semaglutide , 2 MG/DOSE, 8 MG/3ML SOPN, Inject 2 mg as directed once a week. (Patient taking differently: Inject 2 mg as directed every Friday.), Disp: 9 mL, Rfl: 1   telmisartan -hydrochlorothiazide (MICARDIS  HCT) 80-25 MG tablet, Take 1 tablet by mouth daily., Disp: 90 tablet, Rfl: 1   valACYclovir  (VALTREX ) 500 MG tablet, Take 1 tablet (500 mg total) by mouth daily. (Patient taking differently: Take 500 mg by mouth daily as needed (outbreaks).), Disp: 100  tablet, Rfl: 1   venlafaxine  XR (EFFEXOR -XR) 75 MG 24 hr capsule, Take 1 capsule (75 mg total) by mouth daily with breakfast. (Patient taking differently: Take 75 mg by mouth in the morning and at bedtime.), Disp: 90 capsule, Rfl: 1   glucose blood (ONE TOUCH ULTRA TEST) test strip, CHECK FASTING BLOOD SUGARS TWICE DAILY (Patient not taking: Reported on 10/05/2023), Disp: 100 each, Rfl: 2   ONETOUCH DELICA LANCETS FINE MISC, See admin instructions. (Patient not taking: Reported on 10/05/2023), Disp: , Rfl: 0  Allergies  Allergen Reactions   Penicillins Swelling    Did it involve swelling of the face/tongue/throat, SOB, or low BP? Yes Did it involve sudden or severe rash/hives, skin peeling, or any reaction on the inside of your mouth or nose? No Did you need to seek medical attention at a hospital or doctor's office? Yes When did it last happen? unknown If all above answers are "NO", may proceed with cephalosporin use.     I personally reviewed active problem list, medication list, allergies with the patient/caregiver today.   ROS  Ten systems reviewed and is negative except as mentioned in HPI    Objective Physical Exam Constitutional: Patient appears well-developed and well-nourished. Obese  No distress.  HEENT: head atraumatic, normocephalic, pupils equal and reactive to light, neck supple Cardiovascular: Normal rate, regular rhythm and normal heart sounds.  No murmur heard. No BLE edema. Pulmonary/Chest: Effort normal and breath sounds normal. No respiratory distress. Abdominal: Soft.  There is no tenderness. Psychiatric: Patient has a normal mood and affect. behavior is normal. Judgment and thought content normal.   Vitals:   10/05/23 1503  BP: 124/72  Pulse: 92  Resp: 16  SpO2: 98%  Weight: 184 lb 6.4 oz (83.6 kg)  Height: 5' 3 (1.6 m)    Body mass index is 32.66 kg/m.  Recent Results (from the past 2160 hours)  Glucose, capillary     Status: Abnormal   Collection  Time: 07/18/23  2:18 PM  Result Value Ref Range   Glucose-Capillary 102 (H) 70 - 99 mg/dL    Comment: Glucose reference range applies only to samples taken after fasting for at least 8 hours.  Basic metabolic panel     Status: Abnormal   Collection Time: 07/18/23  2:30 PM  Result Value Ref Range   Sodium 142 135 - 145 mmol/L   Potassium 2.9 (L) 3.5 - 5.1 mmol/L   Chloride 105 98 - 111 mmol/L   CO2 29 22 - 32 mmol/L   Glucose, Bld 102 (H) 70 - 99 mg/dL    Comment: Glucose reference range applies only to samples taken after fasting for at least 8 hours.   BUN 6 (L) 8 - 23 mg/dL   Creatinine, Ser 8.29 0.44 - 1.00 mg/dL  Calcium  8.7 (L) 8.9 - 10.3 mg/dL   GFR, Estimated >16 >10 mL/min    Comment: (NOTE) Calculated using the CKD-EPI Creatinine Equation (2021)    Anion gap 8 5 - 15    Comment: Performed at [facility], [address], [place], [place] [address]  Hemoglobin A1c     Status: Abnormal   Collection Time: 07/18/23  2:30 PM  Result Value Ref Range   Hgb A1c MFr Bld 5.7 (H) 4.8 - 5.6 %    Comment: (NOTE) Pre diabetes:          5.7%-6.4%  Diabetes:              >6.4%  Glycemic control for   <7.0% adults with diabetes    Mean Plasma Glucose 116.89 mg/dL    Comment: Performed at [facility], [address] [place], [place] [address]  Surgical pathology     Status: None   Collection Time: 07/18/23  3:05 PM  Result Value Ref Range   SURGICAL PATHOLOGY      SURGICAL PATHOLOGY CASE: [phone] PATIENT: [facility] Surgical Pathology Report     Clinical History: PMB, thickened endometrium, polyp (cm)     FINAL MICROSCOPIC DIAGNOSIS:  A. ENDOMETRIUM, CURETTAGE AND POLYP:      Benign endometrial polyp.      Background benign inactive endometrium.      Negative for hyperplasia, atypia or malignancy.   GROSS DESCRIPTION:  A.  Endometrial polyp and curettings, received fresh and placed in formalin is a 3.5 x 2.5 x 2.0 cm aggregate of  hemorrhagic and polypoid tissue fragments submitted entirely in blocks A1-A10.  SMB 07/19/23  Final Diagnosis performed by Dillard Frame, MD.   Electronically signed 07/20/2023 Technical and / or Professional components performed at [facility]. [facility], [address] [place], [place] [address].  Immunohistochemistry Technical component (if applicable) was performed at [facility]. [address], [address], [place], [place] [address].   IMMUNOHISTOCHEMIST RY DISCLAIMER (if applicable): Some of these immunohistochemical stains may have been developed and the performance characteristics determine by [facility]. Some may not have been cleared or approved by the U.S. Food and Drug Administration. The FDA has determined that such clearance or approval is not necessary. This test is used for clinical purposes. It should not be regarded as investigational or for research. This laboratory is certified under the Clinical Laboratory Improvement Amendments of 1988 (CLIA-88) as qualified to perform high complexity clinical laboratory testing.  The controls stained appropriately.   IHC stains are performed on formalin fixed, paraffin embedded tissue using a 3,3diaminobenzidine (DAB) chromogen and Leica Bond Autostainer System. The staining intensity of the nucleus is score manually and is reported as the percentage of tumor cell nuclei demonstrating specific nuclear staining. The specimens are fixed in 10% Neutral Formal in for at least 6 hours and up to 72hrs. These tests are validated on decalcified tissue. Results should be interpreted with caution given the possibility of false negative results on decalcified specimens. Antibody Clones are as follows ER-clone 72F, PR-clone 16, Ki67- clone MM1. Some of these immunohistochemical stains may have been developed and the performance characteristics determined by [facility].   Glucose, capillary      Status: None   Collection Time: 07/18/23  4:22 PM  Result Value Ref Range   Glucose-Capillary 87 70 - 99 mg/dL    Comment: Glucose reference range applies only to samples taken after fasting for  at least 8 hours.   Comment 1 Document in Chart     Diabetic Foot Exam: {Perform Simple Foot Exam  Perform Detailed exam:1} {Insert foot Exam (Optional):30965}   PHQ2/9:    10/05/2023    3:01 PM 04/27/2023    5:16 PM 02/22/2023    3:38 PM 06/20/2022    3:00 PM 02/11/2022    2:28 PM  Depression screen PHQ 2/9  Decreased Interest 0 1 1 3 3   Down, Depressed, Hopeless 0 1 1 3  0  PHQ - 2 Score 0 2 2 6 3   Altered sleeping 0 0 0 3 0  Tired, decreased energy 0 2 2 3  0  Change in appetite 0 0 0 1 0  Feeling bad or failure about yourself  0 0 0 0 0  Trouble concentrating 0 0 0 0 0  Moving slowly or fidgety/restless 0 0 0 0 0  Suicidal thoughts 0 0 0 0 0  PHQ-9 Score 0 4 4 13 3   Difficult doing work/chores Not difficult at all  Not difficult at all Very difficult Not difficult at all    phq 9 is negative  Fall Risk:    10/05/2023    3:01 PM 02/22/2023    3:29 PM 10/03/2022    2:09 PM 06/20/2022    3:00 PM 02/11/2022    2:28 PM  Fall Risk   Falls in the past year? 0 0 0 0 0  Number falls in past yr: 0    0  Injury with Fall? 0    0  Risk for fall due to : No Fall Risks No Fall Risks No Fall Risks No Fall Risks No Fall Risks  Follow up Education provided;Falls prevention discussed;Falls evaluation completed Falls prevention discussed Falls prevention discussed Falls prevention discussed;Education provided;Falls evaluation completed Education provided;Falls evaluation completed;Falls prevention discussed      Data saved with a previous flowsheet row definition     {A&P:32071}

## 2024-01-09 ENCOUNTER — Ambulatory Visit: Admitting: Family Medicine

## 2024-02-01 ENCOUNTER — Other Ambulatory Visit: Payer: Self-pay | Admitting: Family Medicine

## 2024-02-01 DIAGNOSIS — F411 Generalized anxiety disorder: Secondary | ICD-10-CM

## 2024-04-05 ENCOUNTER — Ambulatory Visit: Admitting: Family Medicine

## 2024-04-18 ENCOUNTER — Other Ambulatory Visit: Payer: Self-pay | Admitting: Family Medicine

## 2024-04-18 DIAGNOSIS — J3089 Other allergic rhinitis: Secondary | ICD-10-CM

## 2024-04-18 DIAGNOSIS — F411 Generalized anxiety disorder: Secondary | ICD-10-CM

## 2024-04-19 NOTE — Telephone Encounter (Signed)
 Requested Prescriptions  Pending Prescriptions Disp Refills   fluticasone  (FLONASE ) 50 MCG/ACT nasal spray [Pharmacy Med Name: FLUTICASONE  PROP 50 MCG SPRAY] 48 mL 1    Sig: SPRAY 2 SPRAYS INTO EACH NOSTRIL EVERY DAY     Ear, Nose, and Throat: Nasal Preparations - Corticosteroids Passed - 04/19/2024 11:40 AM      Passed - Valid encounter within last 12 months    Recent Outpatient Visits           6 months ago Dyslipidemia associated with type 2 diabetes mellitus [facility])   [place] [facility] [place], Krichna, MD               busPIRone  (BUSPAR ) 5 MG tablet [Pharmacy Med Name: BUSPIRONE  HCL 5 MG TABLET] 180 tablet 1    Sig: TAKE 1 TABLET BY MOUTH TWICE A DAY     Psychiatry: Anxiolytics/Hypnotics - Non-controlled Passed - 04/19/2024 11:40 AM      Passed - Valid encounter within last 12 months    Recent Outpatient Visits           6 months ago Dyslipidemia associated with type 2 diabetes mellitus [facility])   [facility] Mire, MD

## 2024-04-28 ENCOUNTER — Other Ambulatory Visit: Payer: Self-pay | Admitting: Family Medicine

## 2024-04-28 DIAGNOSIS — F411 Generalized anxiety disorder: Secondary | ICD-10-CM

## 2024-04-29 NOTE — Telephone Encounter (Signed)
 Requested Prescriptions  Pending Prescriptions Disp Refills   venlafaxine  XR (EFFEXOR -XR) 75 MG 24 hr capsule [Pharmacy Med Name: VENLAFAXINE  HCL ER 75 MG CAP] 180 capsule 0    Sig: TAKE 1 CAPSULE (75 MG TOTAL) BY MOUTH IN THE MORNING AND AT BEDTIME.     Psychiatry: Antidepressants - SNRI - desvenlafaxine & venlafaxine  Failed - 04/29/2024  4:25 PM      Failed - Valid encounter within last 6 months    Recent Outpatient Visits           6 months ago Dyslipidemia associated with type 2 diabetes mellitus [facility])   [facility] [place], Krichna, MD              Failed - Lipid Panel in normal range within the last 12 months    Cholesterol, Total  Date Value Ref Range Status  12/25/2014 140 100 - 199 mg/dL Final   Cholesterol  Date Value Ref Range Status  02/22/2023 145 <200 mg/dL Final   LDL Cholesterol (Calc)  Date Value Ref Range Status  02/22/2023 80 mg/dL (calc) Final    Comment:    Reference range: <100 . Desirable range <100 mg/dL for primary prevention;   <70 mg/dL for patients with CHD or diabetic patients  with > or = 2 CHD risk factors. [facility] LDL-C is now calculated using the Martin-Hopkins  calculation, which is a validated novel method providing  better accuracy than the Friedewald equation in the  estimation of LDL-C.  Gladis APPLETHWAITE et al. SANDREA. 7986;689(80): 2061-2068  (http://education.[facility].com/faq/FAQ164)    HDL  Date Value Ref Range Status  02/22/2023 46 (L) > OR = 50 mg/dL Final  90/91/7983 48 >60 mg/dL Final    Comment:    According to ATP-III Guidelines, HDL-C >59 mg/dL is considered a negative risk factor for CHD.    Triglycerides  Date Value Ref Range Status  02/22/2023 104 <150 mg/dL Final         Passed - Cr in normal range and within 360 days    Creat  Date Value Ref Range Status  02/22/2023 0.80 0.50 - 1.05 mg/dL Final   Creatinine, Ser  Date Value Ref Range Status  07/18/2023 0.76 0.44 - 1.00 mg/dL  Final   Creatinine, Urine  Date Value Ref Range Status  02/22/2023 350 (H) 20 - 275 mg/dL Final    Comment:    Verified by repeat analysis. .          Passed - Last BP in normal range    BP Readings from Last 1 Encounters:  10/05/23 124/72

## 2024-05-14 ENCOUNTER — Ambulatory Visit: Admitting: Family Medicine

## 2024-05-14 ENCOUNTER — Other Ambulatory Visit: Payer: Self-pay | Admitting: Family Medicine

## 2024-05-14 DIAGNOSIS — E1169 Type 2 diabetes mellitus with other specified complication: Secondary | ICD-10-CM

## 2024-05-14 NOTE — Telephone Encounter (Unsigned)
 Copied from CRM #[id number]. Topic: Clinical - Medication Refill >> May 14, 2024 12:04 PM Kevelyn M wrote: Medication: Semaglutide , 2 MG/DOSE, 8 MG/3ML SOPN  Has the patient contacted their pharmacy? No (Agent: If no, request that the patient contact the pharmacy for the refill. If patient does not wish to contact the pharmacy document the reason why and proceed with request.) (Agent: If yes, when and what did the pharmacy advise?)  This is the patient's preferred pharmacy:  [id number] [place], [place] - [address] [place] [address] Phone: [phone] Fax: [phone]  Is this the correct pharmacy for this prescription? Yes If no, delete pharmacy and type the correct one.   Has the prescription been filled recently? No  Is the patient out of the medication? Yes  Has the patient been seen for an appointment in the last year OR does the patient have an upcoming appointment? Yes  Can we respond through MyChart? No  Agent: Please be advised that Rx refills may take up to 3 business days. We ask that you follow-up with your pharmacy.

## 2024-05-15 MED ORDER — SEMAGLUTIDE (2 MG/DOSE) 8 MG/3ML ~~LOC~~ SOPN: 2.0000 mg | PEN_INJECTOR | SUBCUTANEOUS | 0 refills | Status: AC

## 2024-05-15 NOTE — Telephone Encounter (Signed)
 Requested Prescriptions  Pending Prescriptions Disp Refills   Semaglutide , 2 MG/DOSE, 8 MG/3ML SOPN 9 mL 0    Sig: Inject 2 mg as directed once a week.     Endocrinology:  Diabetes - GLP-1 Receptor Agonists - semaglutide  Failed - 05/15/2024 11:51 AM      Failed - HBA1C in normal range and within 180 days    HbA1c, POC (controlled diabetic range)  Date Value Ref Range Status  06/19/2019 7.4 (A) 0.0 - 7.0 % Final   Hgb A1c MFr Bld  Date Value Ref Range Status  07/18/2023 5.7 (H) 4.8 - 5.6 % Final    Comment:    (NOTE) Pre diabetes:          5.7%-6.4%  Diabetes:              >6.4%  Glycemic control for   <7.0% adults with diabetes          Failed - Valid encounter within last 6 months    Recent Outpatient Visits           7 months ago Dyslipidemia associated with type 2 diabetes mellitus [facility])    [facility] [place], Krichna, MD              Passed - Cr in normal range and within 360 days    Creat  Date Value Ref Range Status  02/22/2023 0.80 0.50 - 1.05 mg/dL Final   Creatinine, Ser  Date Value Ref Range Status  07/18/2023 0.76 0.44 - 1.00 mg/dL Final   Creatinine, Urine  Date Value Ref Range Status  02/22/2023 350 (H) 20 - 275 mg/dL Final    Comment:    Verified by repeat analysis. [facility]RA

## 2024-05-28 ENCOUNTER — Ambulatory Visit: Admitting: Family Medicine
